# Patient Record
Sex: Male | Born: 1960
Health system: Southern US, Community
[De-identification: ages and names within clinical notes are randomized; demographics above are authoritative.]

## PROBLEM LIST (undated history)

## (undated) DIAGNOSIS — F419 Anxiety disorder, unspecified: Secondary | ICD-10-CM

## (undated) DIAGNOSIS — K219 Gastro-esophageal reflux disease without esophagitis: Secondary | ICD-10-CM

## (undated) DIAGNOSIS — I10 Essential (primary) hypertension: Secondary | ICD-10-CM

## (undated) DIAGNOSIS — N400 Enlarged prostate without lower urinary tract symptoms: Secondary | ICD-10-CM

## (undated) DIAGNOSIS — M51369 Other intervertebral disc degeneration, lumbar region without mention of lumbar back pain or lower extremity pain: Secondary | ICD-10-CM

## (undated) DIAGNOSIS — F319 Bipolar disorder, unspecified: Secondary | ICD-10-CM

## (undated) DIAGNOSIS — G9589 Other specified diseases of spinal cord: Secondary | ICD-10-CM

## (undated) DIAGNOSIS — G4730 Sleep apnea, unspecified: Secondary | ICD-10-CM

## (undated) DIAGNOSIS — M5136 Other intervertebral disc degeneration, lumbar region: Secondary | ICD-10-CM

## (undated) DIAGNOSIS — T7840XA Allergy, unspecified, initial encounter: Secondary | ICD-10-CM

## (undated) DIAGNOSIS — F4310 Post-traumatic stress disorder, unspecified: Secondary | ICD-10-CM

## (undated) DIAGNOSIS — R569 Unspecified convulsions: Secondary | ICD-10-CM

## (undated) DIAGNOSIS — R413 Other amnesia: Secondary | ICD-10-CM

## (undated) DIAGNOSIS — F209 Schizophrenia, unspecified: Secondary | ICD-10-CM

## (undated) HISTORY — DX: Gastro-esophageal reflux disease without esophagitis: K21.9

## (undated) HISTORY — DX: Post-traumatic stress disorder, unspecified: F43.10

## (undated) HISTORY — DX: Sleep apnea, unspecified: G47.30

## (undated) HISTORY — DX: Bipolar disorder, unspecified: F31.9

## (undated) HISTORY — DX: Other intervertebral disc degeneration, lumbar region: M51.36

## (undated) HISTORY — PX: COLONOSCOPY: SHX174

## (undated) HISTORY — PX: HERNIA REPAIR: SHX51

## (undated) HISTORY — DX: Other specified diseases of spinal cord: G95.89

## (undated) HISTORY — PX: CYSTECTOMY: SUR359

## (undated) HISTORY — DX: Other amnesia: R41.3

## (undated) HISTORY — DX: Schizophrenia, unspecified: F20.9

## (undated) HISTORY — PX: DENTAL SURGERY: SHX609

## (undated) HISTORY — DX: Other intervertebral disc degeneration, lumbar region without mention of lumbar back pain or lower extremity pain: M51.369

## (undated) HISTORY — DX: Allergy, unspecified, initial encounter: T78.40XA

## (undated) HISTORY — DX: Unspecified convulsions: R56.9

## (undated) HISTORY — DX: Anxiety disorder, unspecified: F41.9

---

## 2018-08-18 ENCOUNTER — Emergency Department (HOSPITAL_COMMUNITY)
Admission: EM | Admit: 2018-08-18 | Discharge: 2018-08-18 | Disposition: A | Discharge status: Home / Self Care | Payer: Medicaid Other | Attending: Emergency Medicine | Admitting: Emergency Medicine

## 2018-08-18 ENCOUNTER — Other Ambulatory Visit: Payer: Self-pay

## 2018-08-18 ENCOUNTER — Encounter (HOSPITAL_COMMUNITY): Payer: Self-pay

## 2018-08-18 DIAGNOSIS — I10 Essential (primary) hypertension: Secondary | ICD-10-CM

## 2018-08-18 DIAGNOSIS — R42 Dizziness and giddiness: Secondary | ICD-10-CM | POA: Insufficient documentation

## 2018-08-18 HISTORY — DX: Benign prostatic hyperplasia without lower urinary tract symptoms: N40.0

## 2018-08-18 HISTORY — DX: Essential (primary) hypertension: I10

## 2018-08-18 LAB — CBC WITH DIFFERENTIAL/PLATELET
Abs Immature Granulocytes: 0.01 10*3/uL (ref 0.00–0.07)
Basophils Absolute: 0 10*3/uL (ref 0.0–0.1)
Basophils Relative: 0 %
Eosinophils Absolute: 0.2 10*3/uL (ref 0.0–0.5)
Eosinophils Relative: 4 %
HCT: 43.8 % (ref 39.0–52.0)
Hemoglobin: 14.8 g/dL (ref 13.0–17.0)
Immature Granulocytes: 0 %
Lymphocytes Relative: 37 %
Lymphs Abs: 1.8 10*3/uL (ref 0.7–4.0)
MCH: 29.5 pg (ref 26.0–34.0)
MCHC: 33.8 g/dL (ref 30.0–36.0)
MCV: 87.4 fL (ref 80.0–100.0)
Monocytes Absolute: 0.5 10*3/uL (ref 0.1–1.0)
Monocytes Relative: 10 %
Neutro Abs: 2.3 10*3/uL (ref 1.7–7.7)
Neutrophils Relative %: 49 %
Platelets: 219 10*3/uL (ref 150–400)
RBC: 5.01 MIL/uL (ref 4.22–5.81)
RDW: 14.8 % (ref 11.5–15.5)
WBC: 4.8 10*3/uL (ref 4.0–10.5)
nRBC: 0 % (ref 0.0–0.2)

## 2018-08-18 LAB — COMPREHENSIVE METABOLIC PANEL
ALT: 21 U/L (ref 0–44)
AST: 26 U/L (ref 15–41)
Albumin: 3.8 g/dL (ref 3.5–5.0)
Alkaline Phosphatase: 79 U/L (ref 38–126)
Anion gap: 8 (ref 5–15)
BUN: 21 mg/dL — ABNORMAL HIGH (ref 6–20)
CO2: 23 mmol/L (ref 22–32)
Calcium: 8.9 mg/dL (ref 8.9–10.3)
Chloride: 107 mmol/L (ref 98–111)
Creatinine, Ser: 1.19 mg/dL (ref 0.61–1.24)
GFR calc Af Amer: >60 mL/min (ref >60)
GFR calc non Af Amer: >60 mL/min (ref >60)
Glucose, Bld: 76 mg/dL (ref 70–99)
Potassium: 3.8 mmol/L (ref 3.5–5.1)
Sodium: 138 mmol/L (ref 135–145)
Total Bilirubin: 0.4 mg/dL (ref 0.3–1.2)
Total Protein: 7 g/dL (ref 6.5–8.1)

## 2018-08-18 NOTE — ED Provider Notes (Signed)
[facility]-EMERGENCY DEPT Provider Note   CSN: [id number] Arrival date & time: 08/18/18  1258  History   Chief Complaint Chief Complaint  Patient presents with  . Hypertension    HPI Jeremiah Hunter is a 58 y.o. male with a PMH of HTN, enlarged prostate, depression, and anxiety presenting due to concerns about hypertension for 1 month. Patient reports he had his medications increased last month, but continues to have elevated blood pressures. Patient reports he had a brief episode of dizziness described as lightheadedness this morning while sitting down. Patient states it lasted a few seconds. Patient states nothing made symptoms better or worse. Patient states dizziness has completely resolved on its own. Patient denies fever, chills, nausea, vomiting, abdominal pain, congestion, headache, numbness, weakness, syncope, vision changes, chest pain, or shortness of breath.     HPI  Past Medical History:  Diagnosis Date  . Enlarged prostate   . Hypertension     There are no active problems to display for this patient.   Past Surgical History:  Procedure Laterality Date  . CYSTECTOMY    . HERNIA REPAIR          Home Medications    Prior to Admission medications   Not on File    Family History Family History  Family history unknown: Yes    Social History Social History   Tobacco Use  . Smoking status: Never Smoker  . Smokeless tobacco: Never Used  Substance Use Topics  . Alcohol use: Never    Frequency: Never  . Drug use: Never     Allergies   Patient has no known allergies.   Review of Systems Review of Systems  Constitutional: Negative for chills, diaphoresis and fever.  HENT: Negative for congestion and rhinorrhea.   Eyes: Negative for visual disturbance.  Respiratory: Negative for cough and shortness of breath.   Cardiovascular: Negative for chest pain, palpitations and leg swelling.  Gastrointestinal: Negative for abdominal  pain, nausea and vomiting.  Endocrine: Negative for cold intolerance and heat intolerance.  Genitourinary: Negative for difficulty urinating.  Musculoskeletal: Negative for back pain.  Skin: Negative for rash.  Allergic/Immunologic: Negative for immunocompromised state.  Neurological: Positive for dizziness and light-headedness. Negative for tremors, seizures, syncope, facial asymmetry, speech difficulty, weakness, numbness and headaches.  Hematological: Negative for adenopathy.     Physical Exam Updated Vital Signs BP (!) 130/96 (BP Location: Right Arm)   Pulse 100   Temp 100 F (37.8 C) (Oral)   Resp 20   Ht 5\' 11"  (1.803 m)   Wt 95.3 kg   SpO2 99%   BMI 29.29 kg/m   Physical Exam Vitals signs and nursing note reviewed.  Constitutional:      General: He is not in acute distress.    Appearance: He is well-developed. He is not diaphoretic.  HENT:     Head: Normocephalic and atraumatic.     Mouth/Throat:     Mouth: Mucous membranes are moist.     Pharynx: No oropharyngeal exudate or posterior oropharyngeal erythema.  Eyes:     Extraocular Movements: Extraocular movements intact.     Conjunctiva/sclera: Conjunctivae normal.     Pupils: Pupils are equal, round, and reactive to light.  Neck:     Musculoskeletal: Normal range of motion and neck supple.  Cardiovascular:     Rate and Rhythm: Normal rate and regular rhythm.     Heart sounds: Normal heart sounds. No murmur. No friction rub. No gallop.  Pulmonary:     Effort: Pulmonary effort is normal. No respiratory distress.     Breath sounds: Normal breath sounds. No wheezing or rales.  Abdominal:     Palpations: Abdomen is soft.     Tenderness: There is no abdominal tenderness.  Musculoskeletal: Normal range of motion.  Skin:    General: Skin is warm.     Findings: No erythema or rash.  Neurological:     Mental Status: He is alert.  Psychiatric:        Mood and Affect: Mood is anxious.    Mental Status:  Alert,  oriented, thought content appropriate, able to give a coherent history. Speech fluent without evidence of aphasia. Able to follow 2 step commands without difficulty.  Cranial Nerves:  II:  Peripheral visual fields grossly normal, pupils equal, round, reactive to light III,IV, VI: ptosis not present, extra-ocular motions intact bilaterally  V,VII: smile symmetric, facial light touch sensation equal VIII: hearing grossly normal to voice  IX,X: symmetric elevation of soft palate, uvula elevates symmetrically  XI: bilateral shoulder shrug symmetric and strong XII: midline tongue extension without fassiculations Motor:  Normal tone. 5/5 in upper and lower extremities bilaterally including strong and equal grip strength and dorsiflexion/plantar flexion Sensory: Pinprick and light touch normal in all extremities.  Deep Tendon Reflexes: 2+ and symmetric in the biceps and patella Cerebellar: normal finger-to-nose with bilateral upper extremities Gait: normal gait and balance.  Negative pronator drift. Negative Romberg sign. CV: distal pulses palpable throughout   ED Treatments / Results  Labs (all labs ordered are listed, but only abnormal results are displayed) Labs Reviewed  COMPREHENSIVE METABOLIC PANEL - Abnormal; Notable for the following components:      Result Value   BUN 21 (*)    All other components within normal limits  CBC WITH DIFFERENTIAL/PLATELET    EKG EKG Interpretation  Date/Time:  Monday August 18 2018 16:00:51 EDT Ventricular Rate:  72 PR Interval:    QRS Duration: 89 QT Interval:  370 QTC Calculation: 405 R Axis:   34 Text Interpretation:  Sinus rhythm Low voltage, extremity leads Confirmed by Davonna Belling [phone]) on 08/18/2018 5:09:15 PM   Radiology No results found.  Procedures Procedures (including critical care time)  Medications Ordered in ED Medications - No data to display   Initial Impression / Assessment and Plan / ED Course  I have reviewed  the triage vital signs and the nursing notes.  Pertinent labs & imaging results that were available during my care of the patient were reviewed by me and considered in my medical decision making (see chart for details).       Patient presents with concerns about hypertension and after an episode of lightheadedness. Vitals and labs reviewed. Blood pressure has been in the 130s/90s. Neurological exam is normal. Orthostatic vitals are normal. Patient has been asymptomatic while in the ER. Discussed BP during today's visit. Patient denies any symptoms of hypertensive crisis. Patient is stable in no acute distress. Advised patient to follow up with PCP. Discussed return precautions. Patient will be discharged home.   Final Clinical Impressions(s) / ED Diagnoses   Final diagnoses:  Essential hypertension  Dizziness    ED Discharge Orders    None       Julienne Kass 08/18/18 1709    Davonna Belling, MD 08/18/18 2151

## 2018-08-18 NOTE — Discharge Instructions (Addendum)
You have been seen today for hypertension and dizziness. Please read and follow all provided instructions.   1. Medications: usual home medications 2. Treatment: rest, drink plenty of fluids 3. Follow Up: Please follow up with your primary doctor in 2-5 days for discussion of your diagnoses and further evaluation after today's visit; if you do not have a primary care doctor use the resource guide provided to find one; Please return to the ER for any new or worsening symptoms. Please obtain all of your results from medical records or have your doctors office obtain the results - share them with your doctor - you should be seen at your doctors office. Call today to arrange your follow up.   Take medications as prescribed. Please review all of the medicines and only take them if you do not have an allergy to them. Return to the emergency room for worsening condition or new concerning symptoms. Follow up with your regular doctor. If you don't have a regular doctor use one of the numbers below to establish a primary care doctor. ? ?  You should return to the ER if you develop severe or worsening symptoms.   Emergency Department Resource Guide 1) Find a Doctor and Pay Out of Pocket Although you won't have to find out who is covered by your insurance plan, it is a good idea to ask around and get recommendations. You will then need to call the office and see if the doctor you have chosen will accept you as a new patient and what types of options they offer for patients who are self-pay. Some doctors offer discounts or will set up payment plans for their patients who do not have insurance, but you will need to ask so you aren't surprised when you get to your appointment.  2) Contact Your Local Health Department Not all health departments have doctors that can see patients for sick visits, but many do, so it is worth a call to see if yours does. If you don't know where your local health department is, you can  check in your phone book. The CDC also has a tool to help you locate your state's health department, and many state websites also have listings of all of their local health departments.  3) Find a [facility] If your illness is not likely to be very severe or complicated, you may want to try a walk in clinic. These are popping up all over the country in pharmacies, drugstores, and shopping centers. They're usually staffed by nurse practitioners or physician assistants that have been trained to treat common illnesses and complaints. They're usually fairly quick and inexpensive. However, if you have serious medical issues or chronic medical problems, these are probably not your best option.  No Primary Care Doctor: Call Health Connect at  [phone] - they can help you locate a primary care doctor that  accepts your insurance, provides certain services, etc. Physician Referral Service- [phone]  Chronic Pain Problems: Organization         Address  Phone   Notes  [facility]  [phone] Patients need to be referred by their primary care doctor.   Medication Assistance: Organization         Address  Phone   Notes  [facility] Medication [facility] [address] [place], [place] [address] [phone] --Must be a resident of [facility] -- Must have NO insurance coverage whatsoever (no Medicaid/ Medicare, etc.) -- The  pt. MUST have a primary care doctor that directs their care regularly and follows them in the community   [facility]ist  [phone]   [employer]  [phone]    Agencies that provide inexpensive medical care: Organization         Address  Phone   Notes  [facility]  [phone]   [facility]    [phone]   [facility] [address] [place], [place] [id number] [phone]   [facility] [address], [place] [phone]    [facility]    [phone]   [facility]    [phone]   [facility]  [address], [place] Phone:  [phone], Fax:  [phone] Hours of Operation:  9 am - 6 pm, M-F.  Also accepts Medicaid/Medicare and self-pay.  [facility]  [address], [place] Phone: [phone], Fax: [phone]. Hours of Operation:  8:30 am - 5:30 pm, M-F.  Also accepts Medicaid and self-pay.  [facility] [place] [address], [place] Phone: [phone]) [phone]   [facility] [address], [place], [place] [phone], Ext. [phone] Mondays & Thursdays: 7-9 AM.  First 15 patients are seen on a first come, first serve basis.    Medicaid-accepting [facility] Providers:  Organization         Address  Phone   Notes  [facility] [address], [place] [phone] Also accepts self-pay patients.  [facility] [address] [place], [place]  [phone]   [facility] [address], [place] [phone]   [facility] [address] [place] [address], [place] [phone]   Renaye RakersVeita Bland [address], [place]   [phone] Only accepts [place] patients after they have their name applied to their card.   Self-Pay (no insurance) in [facility]:  Organization         Address  Phone   Notes  Sickle Cell Patients, [facility] [address], [place] [phone]   [facility] [address], [place] [phone]   [facility] [place]  [address], [place] [phone]   [facility]/Dr. Osei-Bonsu  [address] [place] [address], [place] or [phone], [place] [phone] Phone number for both [place] and [place] locations is the  same.  [facility] [address], [place] [phone]   [facility]  [address] [place] [address], [place] or [address] [phone]   [facility] [address], [place] [phone], phone; [phone]) [phone], fax Sees patients 1st and 3rd Saturday of every month.  Must not qualify for public or private insurance (i.e. Medicaid, Medicare, [place] Health Choice, Veterans' Benefits)  Household income should be no more than 200% of the poverty level The clinic cannot treat you if you are pregnant or think you are pregnant  Sexually transmitted diseases are not treated at the clinic.

## 2018-08-18 NOTE — ED Triage Notes (Signed)
Patient states he has been having increased BP x 1 month. Patient states his medications were changed 1 month ago and is still having hypertension. BP in triage 133/96 HR 114

## 2018-10-07 ENCOUNTER — Telehealth: Payer: Self-pay | Admitting: Nurse Practitioner

## 2018-10-08 ENCOUNTER — Encounter: Payer: Self-pay | Admitting: Family Medicine

## 2018-10-08 ENCOUNTER — Ambulatory Visit: Payer: Medicaid Other | Attending: Nurse Practitioner | Admitting: Family Medicine

## 2018-10-08 ENCOUNTER — Ambulatory Visit (HOSPITAL_BASED_OUTPATIENT_CLINIC_OR_DEPARTMENT_OTHER): Payer: Medicaid Other | Admitting: Licensed Clinical Social Worker

## 2018-10-08 ENCOUNTER — Other Ambulatory Visit: Payer: Self-pay

## 2018-10-08 DIAGNOSIS — R350 Frequency of micturition: Secondary | ICD-10-CM

## 2018-10-08 DIAGNOSIS — F419 Anxiety disorder, unspecified: Secondary | ICD-10-CM

## 2018-10-08 DIAGNOSIS — I10 Essential (primary) hypertension: Secondary | ICD-10-CM

## 2018-10-08 DIAGNOSIS — F329 Major depressive disorder, single episode, unspecified: Secondary | ICD-10-CM | POA: Diagnosis not present

## 2018-10-08 DIAGNOSIS — F32A Depression, unspecified: Secondary | ICD-10-CM

## 2018-10-08 DIAGNOSIS — F333 Major depressive disorder, recurrent, severe with psychotic symptoms: Secondary | ICD-10-CM

## 2018-10-08 MED ORDER — LOSARTAN POTASSIUM 25 MG PO TABS: 25.0000 mg | ORAL_TABLET | Freq: Every day | ORAL | 5 refills | Status: DC

## 2018-10-08 MED ORDER — AMLODIPINE BESYLATE 10 MG PO TABS: 10.0000 mg | ORAL_TABLET | Freq: Every day | ORAL | 5 refills | Status: DC

## 2018-10-08 NOTE — Progress Notes (Signed)
Pt states he has physical pain   Pt states when he goes out in public he feels like someone is out to kill him   Pt states he wakes his sister up at time
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
## 2018-10-09 MED FILL — LOSARTAN POTASSIUM 25 MG TA: 25 | 30 days supply | Qty: 30 | Fill #0

## 2018-10-09 MED FILL — AMLODIPINE BESYLATE 10 MG T: 10 | 30 days supply | Qty: 30 | Fill #0

## 2018-10-10 ENCOUNTER — Other Ambulatory Visit: Payer: Self-pay

## 2018-10-13 ENCOUNTER — Ambulatory Visit: Payer: Self-pay | Attending: Family Medicine

## 2018-10-13 DIAGNOSIS — R350 Frequency of micturition: Secondary | ICD-10-CM

## 2018-10-13 NOTE — Addendum Note (Signed)
Addended by: Emilio Aspen A on: 10/13/2018 08:59 AM   Modules accepted: Orders

## 2018-10-14 ENCOUNTER — Other Ambulatory Visit: Payer: Self-pay | Admitting: Family Medicine

## 2018-10-14 DIAGNOSIS — R319 Hematuria, unspecified: Secondary | ICD-10-CM

## 2018-10-14 LAB — URINALYSIS
Bilirubin, UA: NEGATIVE
Glucose, UA: NEGATIVE
Ketones, UA: NEGATIVE
Leukocytes,UA: NEGATIVE
Nitrite, UA: NEGATIVE
Protein,UA: NEGATIVE
Specific Gravity, UA: 1.013 (ref 1.005–1.030)
Urobilinogen, Ur: 0.2 mg/dL (ref 0.2–1.0)
pH, UA: 5.5 (ref 5.0–7.5)

## 2018-10-14 LAB — BASIC METABOLIC PANEL WITH GFR
BUN/Creatinine Ratio: 22 — ABNORMAL HIGH (ref 9–20)
BUN: 25 mg/dL — ABNORMAL HIGH (ref 6–24)
CO2: 22 mmol/L (ref 20–29)
Calcium: 9.2 mg/dL (ref 8.7–10.2)
Chloride: 105 mmol/L (ref 96–106)
Creatinine, Ser: 1.16 mg/dL (ref 0.76–1.27)
GFR calc Af Amer: 81 mL/min/1.73
GFR calc non Af Amer: 70 mL/min/1.73
Glucose: 108 mg/dL — ABNORMAL HIGH (ref 65–99)
Potassium: 4 mmol/L (ref 3.5–5.2)
Sodium: 141 mmol/L (ref 134–144)

## 2018-10-14 NOTE — BH Specialist Note (Signed)
Integrated Behavioral Health Visit via Telemedicine (Telephone)  10/08/2018 [name] [id number]   Session Start time: 3:00 PM  Session End time: 3:20 PM Total time: 20 minutes  Referring Provider: Dr. Chapman Fitch Type of Visit: Telephonic Patient location: Home [facility] Provider location: Office All persons participating in visit: Pt and LCSW  Confirmed patient's address: Yes  Confirmed patient's phone number: Yes  Any changes to demographics: No   Confirmed patient's insurance: Yes  Any changes to patient's insurance: No   Discussed confidentiality: Yes    The following statements were read to the patient and/or legal guardian that are established with the [facility] Provider.  "The purpose of this phone visit is to provide behavioral health care while limiting exposure to the coronavirus (COVID19).  There is a possibility of technology failure and discussed alternative modes of communication if that failure occurs."  "By engaging in this telephone visit, you consent to the provision of healthcare.  Additionally, you authorize for your insurance to be billed for the services provided during this telephone visit."   Patient and/or legal guardian consented to telephone visit: Yes   PRESENTING CONCERNS: Patient and/or family reports the following symptoms/concerns: Pt reports difficulty managing mental health conditions. Reports audio/visual non-command hallucinations that scare him occasionally "I don't feel safe nowhere"  Pt resides with sister and enjoys spending time with nieces and nephews. He participates in counseling through [facility], night class on Mondays and Tuesdays, and Peer Support with Mr. Liliane Channel  Pt reports that he is currently on probation. He does not have any income and/or insurance  Duration of problem: "years"; Severity of problem: severe  STRENGTHS (Protective Factors/Coping Skills): Pt receives strong family support Pt is open to services  (psychotherapy/psychiatry)  GOALS ADDRESSED: Patient will: 1.  Reduce symptoms of: anxiety, depression and stress  2.  Increase knowledge and/or ability of: coping skills and healthy habits  3.  Demonstrate ability to: Increase healthy adjustment to current life circumstances and Increase adequate support systems for patient/family  INTERVENTIONS: Interventions utilized:  Mindfulness or Psychologist, educational, Supportive Counseling, Psychoeducation and/or Health Education and Link to [employer] Standardized Assessments completed: GAD-7 and PHQ 2&9  ASSESSMENT: Patient currently experiencing depression and anxiety triggered by psychosocial stressors. He reports audio/visual non-command hallucinations that scare him occasionally "I don't feel safe nowhere" Pt is receiving services in the community. Denies SI/HI.  Patient may benefit from medication management. He states that service providers were not person-centered in their treatment planning. LCSW provided validation and encouragement. Therapeutic interventions were discussed to assist in decrease and/or management of symptoms.   PLAN: 1. Follow up with behavioral health clinician on : Schedule follow up appointment  2. Behavioral recommendations: LCSW recommends pt utilize strategies discussed in session and comply with recommendations of PCP 3. Referral(s): [place] (In Clinic)  Rebekah Chesterfield, [place] 10/14/2018 4:41 PM

## 2018-10-14 NOTE — Progress Notes (Signed)
Patient ID: Jeremiah Hunter, male   DOB: 06-Jan-1961, 58 y.o.   MRN: [id number]   Patient with rbc's on recent UA and referral to Urology will be placed

## 2018-10-20 ENCOUNTER — Ambulatory Visit: Payer: Self-pay

## 2018-10-23 ENCOUNTER — Telehealth: Payer: Self-pay | Admitting: Family Medicine

## 2018-10-23 NOTE — Telephone Encounter (Signed)
Called patient to schedule his f/u appointment. Patient stated he has found a new PCP closer to his home.

## 2018-10-23 NOTE — Telephone Encounter (Signed)
-----   Message from Jackelyn Knife, [place] sent at 10/08/2018  5:06 PM EDT -----  lab visit in the next 1-2 weeks and he will need office visit in 4-5 weeks

## 2018-10-24 ENCOUNTER — Ambulatory Visit: Payer: Self-pay

## 2018-11-06 ENCOUNTER — Emergency Department (HOSPITAL_COMMUNITY)
Admission: EM | Admit: 2018-11-06 | Discharge: 2018-11-09 | Disposition: A | Discharge status: Home / Self Care | Payer: Medicaid Other | Attending: Emergency Medicine | Admitting: Emergency Medicine

## 2018-11-06 ENCOUNTER — Encounter (HOSPITAL_COMMUNITY): Payer: Self-pay | Admitting: Emergency Medicine

## 2018-11-06 DIAGNOSIS — Z20828 Contact with and (suspected) exposure to other viral communicable diseases: Secondary | ICD-10-CM | POA: Diagnosis not present

## 2018-11-06 DIAGNOSIS — F333 Major depressive disorder, recurrent, severe with psychotic symptoms: Secondary | ICD-10-CM | POA: Insufficient documentation

## 2018-11-06 DIAGNOSIS — F329 Major depressive disorder, single episode, unspecified: Secondary | ICD-10-CM | POA: Diagnosis present

## 2018-11-06 DIAGNOSIS — F22 Delusional disorders: Secondary | ICD-10-CM

## 2018-11-06 DIAGNOSIS — R45851 Suicidal ideations: Secondary | ICD-10-CM

## 2018-11-06 DIAGNOSIS — I10 Essential (primary) hypertension: Secondary | ICD-10-CM | POA: Insufficient documentation

## 2018-11-06 LAB — COMPREHENSIVE METABOLIC PANEL
ALT: 22 U/L (ref 0–44)
AST: 24 U/L (ref 15–41)
Albumin: 3.7 g/dL (ref 3.5–5.0)
Alkaline Phosphatase: 95 U/L (ref 38–126)
Anion gap: 9 (ref 5–15)
BUN: 25 mg/dL — ABNORMAL HIGH (ref 6–20)
CO2: 23 mmol/L (ref 22–32)
Calcium: 9.1 mg/dL (ref 8.9–10.3)
Chloride: 109 mmol/L (ref 98–111)
Creatinine, Ser: 1.19 mg/dL (ref 0.61–1.24)
GFR calc Af Amer: >60 mL/min (ref >60)
GFR calc non Af Amer: >60 mL/min (ref >60)
Glucose, Bld: 109 mg/dL — ABNORMAL HIGH (ref 70–99)
Potassium: 3.6 mmol/L (ref 3.5–5.1)
Sodium: 141 mmol/L (ref 135–145)
Total Bilirubin: 0.7 mg/dL (ref 0.3–1.2)
Total Protein: 6.7 g/dL (ref 6.5–8.1)

## 2018-11-06 LAB — RAPID URINE DRUG SCREEN, HOSP PERFORMED
Amphetamines: NOT DETECTED
Barbiturates: NOT DETECTED
Benzodiazepines: NOT DETECTED
Cocaine: NOT DETECTED
Opiates: NOT DETECTED
Tetrahydrocannabinol: NOT DETECTED

## 2018-11-06 LAB — CBC
HCT: 41.2 % (ref 39.0–52.0)
Hemoglobin: 14.2 g/dL (ref 13.0–17.0)
MCH: 30 pg (ref 26.0–34.0)
MCHC: 34.5 g/dL (ref 30.0–36.0)
MCV: 87.1 fL (ref 80.0–100.0)
Platelets: 210 10*3/uL (ref 150–400)
RBC: 4.73 MIL/uL (ref 4.22–5.81)
RDW: 14.4 % (ref 11.5–15.5)
WBC: 4.5 10*3/uL (ref 4.0–10.5)
nRBC: 0 % (ref 0.0–0.2)

## 2018-11-06 LAB — ETHANOL: Alcohol, Ethyl (B): <10 mg/dL (ref <10)

## 2018-11-06 LAB — ACETAMINOPHEN LEVEL: Acetaminophen (Tylenol), Serum: <10 ug/mL — ABNORMAL LOW (ref 10–30)

## 2018-11-06 LAB — SALICYLATE LEVEL: Salicylate Lvl: <7 mg/dL (ref 2.8–30.0)

## 2018-11-06 MED ORDER — HALOPERIDOL LACTATE 5 MG/ML IJ SOLN: 5.0000 mg | Freq: Four times a day (QID) | INTRAMUSCULAR | Status: DC | PRN

## 2018-11-06 MED ORDER — ONDANSETRON HCL 4 MG PO TABS: 4.0000 mg | ORAL_TABLET | Freq: Three times a day (TID) | ORAL | Status: DC | PRN

## 2018-11-06 MED ORDER — ACETAMINOPHEN 325 MG PO TABS: 650.0000 mg | ORAL_TABLET | ORAL | Status: DC | PRN

## 2018-11-06 MED ORDER — ALUM & MAG HYDROXIDE-SIMETH 200-200-20 MG/5ML PO SUSP: 30.0000 mL | Freq: Four times a day (QID) | ORAL | Status: DC | PRN

## 2018-11-06 NOTE — ED Provider Notes (Signed)
[facility] EMERGENCY DEPARTMENT Provider Note   CSN: [id number] Arrival date & time: 11/06/18  1737     History   Chief Complaint Chief Complaint  Patient presents with  . Suicidal    HPI Jeremiah Hunter is a 58 y.o. male.     HPI  The patient is a 58 year old male who presented to the ED with concern for suicidal ideation and psychosis. The patient made a statement to triage nursing regarding a desire to "not be here anymore" and stated that he thought nightly about ingesting a bottle of pills. On my assessment, the patient was paranoid and would not answer questions.   Past Medical History:  Diagnosis Date  . Enlarged prostate   . Hypertension     There are no active problems to display for this patient.   Past Surgical History:  Procedure Laterality Date  . CYSTECTOMY    . HERNIA REPAIR          Home Medications    Prior to Admission medications   Medication Sig Start Date End Date Taking? Authorizing Provider  amLODipine (NORVASC) 10 MG tablet Take 1 tablet (10 mg total) by mouth daily. To lower blood pressure 10/08/18   Fulp, Cammie, MD  losartan (COZAAR) 25 MG tablet Take 1 tablet (25 mg total) by mouth daily. To lower blood pressure 10/08/18   Fulp, Hewitt Shorts, MD    Family History Family History  Family history unknown: Yes    Social History Social History   Tobacco Use  . Smoking status: Never Smoker  . Smokeless tobacco: Never Used  Substance Use Topics  . Alcohol use: Never    Frequency: Never  . Drug use: Never     Allergies   Patient has no known allergies.   Review of Systems Review of Systems  Unable to perform ROS: Other     Physical Exam Updated Vital Signs BP (!) 106/94 (BP Location: Left Arm)   Pulse 94   Temp 98.2 F (36.8 C) (Oral)   Resp 16   SpO2 97%   Physical Exam Vitals signs and nursing note reviewed.  Constitutional:      Appearance: He is well-developed.  HENT:     Head: Normocephalic  and atraumatic.  Eyes:     Conjunctiva/sclera: Conjunctivae normal.  Neck:     Musculoskeletal: Neck supple.  Cardiovascular:     Rate and Rhythm: Normal rate and regular rhythm.  Pulmonary:     Effort: Pulmonary effort is normal. No respiratory distress.     Breath sounds: Normal breath sounds.  Abdominal:     Palpations: Abdomen is soft.     Tenderness: There is no abdominal tenderness.  Skin:    General: Skin is warm and dry.  Neurological:     Mental Status: He is alert.  Psychiatric:        Mood and Affect: Mood is anxious.        Behavior: Behavior is uncooperative.        Thought Content: Thought content is paranoid and delusional.     Comments: The patient endorsed delusions of hospital staff attempting to harm him. He refused to answer questions regarding thoughts of suicide or hallucinations but had previously admitted to [facility] with a plan with triage nursing      ED Treatments / Results  Labs (all labs ordered are listed, but only abnormal results are displayed) Labs Reviewed  COMPREHENSIVE METABOLIC PANEL - Abnormal; Notable for the following  components:      Result Value   Glucose, Bld 109 (*)    BUN 25 (*)    All other components within normal limits  ACETAMINOPHEN LEVEL - Abnormal; Notable for the following components:   Acetaminophen (Tylenol), Serum <10 (*)    All other components within normal limits  SARS CORONAVIRUS 2 BY RT PCR (HOSPITAL ORDER, [place] [facility])  ETHANOL  SALICYLATE LEVEL  CBC  RAPID URINE DRUG SCREEN, HOSP PERFORMED    EKG None  Radiology No results found.  Procedures Procedures (including critical care time)  Medications Ordered in ED Medications  acetaminophen (TYLENOL) tablet 650 mg (has no administration in time range)  ondansetron (ZOFRAN) tablet 4 mg (has no administration in time range)  alum & mag hydroxide-simeth (MAALOX/MYLANTA) 200-200-20 MG/5ML suspension 30 mL (has no administration in time  range)  haloperidol lactate (HALDOL) injection 5 mg (has no administration in time range)     Initial Impression / Assessment and Plan / ED Course  I have reviewed the triage vital signs and the nursing notes.  Pertinent labs & imaging results that were available during my care of the patient were reviewed by me and considered in my medical decision making (see chart for details).        The patient presents to the ED with SI and a plan with additional paranoia and delusions of hospital staff attempting to harm him. He would not provided further information on initial assessment and was uncooperative. He was placed in an IVC status by Dr. Dayna Barker. Plan will be for a psychiatric hold and consultation with psychiatry. Family was not able to be contacted on initial assessment.   Final Clinical Impressions(s) / ED Diagnoses   Final diagnoses:  Suicidal thoughts  Paranoia [facility])    ED Discharge Orders    None       Regan Lemming, MD 11/07/18 0867    Merrily Pew, MD 11/08/18 [phone]

## 2018-11-06 NOTE — BHH Counselor (Addendum)
Clinician attempted to engage the pt in TTS assessment however he was adamant not to engage because Amy (clinician at "[place]") is unable to engage because she's not at work. Pt reported, he went to "[place]" in [facility] today, he initially thought Amy was a doctor but learned she was a Social worker. Pt reported, he spoke to Amy via tele-assessment and recommend he come to hospital. Pt reported, he was brought to [facility] by his family. Pt reported, she was supposed to come to the hospital on Monday. Clinician asked the pt if he consents for her to contact Amy and provide her with her contact information; she could gather collateral information. Pt declined, reported, he wants to complete the assessment with Amy on three-way. Clinician attempted to discussed the process however the pt interrupted and said he was not talking and wanted to go home.   *Pt declined for clinician to contact anyone, to gather collateral information.*   Spoke to Dr. Dayna Barker he IVC'd the pt and is fine with the pt having his TTS assessment completed during day-shift, as he would not engage.   Vertell Novak, [place], [facility], [facility] Triage Specialist [phone]

## 2018-11-06 NOTE — ED Notes (Signed)
Pts belonging were taken and placed in patient bags and at triage desk. There were no purple pants for pt so he is in wine colored shirt with his own jeans. Pt has removed everything from pockets and his belt. Security has been called for wanding.

## 2018-11-06 NOTE — ED Triage Notes (Signed)
pt arrives ambulatory and voluntary to ED with c/o of feeling like he wants to "not be here anymore". Pt states every night he looks at his pill bottles and thinks about taking them all.

## 2018-11-06 NOTE — BHH Counselor (Signed)
Clinician was transferred to the wrong nurse, clinician called again and noted a nurse was not assigned to the pt and was transferred to [facility], [place].   Clinician spoke to [facility], [place] to ask if she was the nurse working the pt. Monica reported, she was not but she will inform the charge nurse to place the pt in a private to complete his TTS assessment. Clinician expressed she will call the cart in 10 minutes.    Vertell Novak, [place], [facility], [facility] Triage Specialist [phone]

## 2018-11-06 NOTE — ED Notes (Signed)
Pt refusing to talk to this RN. Just keeps saying "youre going to keep me and kill me. That's what you guys do."

## 2018-11-07 ENCOUNTER — Other Ambulatory Visit: Payer: Self-pay

## 2018-11-07 LAB — SARS CORONAVIRUS 2 BY RT PCR (HOSPITAL ORDER, PERFORMED IN ~~LOC~~ HOSPITAL LAB): SARS Coronavirus 2: NEGATIVE
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
## 2018-11-07 NOTE — ED Notes (Signed)
Patient is resting comfortably. 

## 2018-11-07 NOTE — ED Notes (Signed)
Breakfast tray ordered 

## 2018-11-07 NOTE — BH Assessment (Signed)
  Under review [facility], [facility], [place], [place] [place], [place], [facility], [facility], [place], [facility], [facility], [place], [place], [place], [place], [place], [place], [place], [facility], [facility], [facility], [facility], [facility], [facility], [facility], [facility], [facility], [facility], [place], [place], [place], [place], [place], [place], [facility], [place]  

## 2018-11-08 MED ORDER — LORATADINE 10 MG PO TABS
10.0000 mg | ORAL_TABLET | Freq: Every day | ORAL | Status: DC
  Filled 2018-11-08: qty 1

## 2018-11-08 MED ORDER — OLANZAPINE 5 MG PO TABS
5.0000 mg | ORAL_TABLET | Freq: Every day | ORAL | Status: DC
  Administered 2018-11-08: 5 mg via ORAL
  Filled 2018-11-08: qty 1

## 2018-11-08 MED ORDER — LOSARTAN POTASSIUM 50 MG PO TABS
25.0000 mg | ORAL_TABLET | Freq: Every day | ORAL | Status: DC
  Administered 2018-11-08: 25 mg via ORAL
  Filled 2018-11-08: qty 1

## 2018-11-08 MED ORDER — TAMSULOSIN HCL 0.4 MG PO CAPS
0.4000 mg | ORAL_CAPSULE | Freq: Every day | ORAL | Status: DC
  Administered 2018-11-08 – 2018-11-09 (×2): 0.4 mg via ORAL
  Filled 2018-11-08 (×2): qty 1

## 2018-11-08 MED ORDER — AMLODIPINE BESYLATE 5 MG PO TABS: 10.0000 mg | ORAL_TABLET | Freq: Every day | ORAL | Status: DC

## 2018-11-08 MED ORDER — DIVALPROEX SODIUM 250 MG PO DR TAB
250.0000 mg | DELAYED_RELEASE_TABLET | Freq: Two times a day (BID) | ORAL | Status: DC
  Administered 2018-11-08 (×2): 250 mg via ORAL
  Filled 2018-11-08 (×2): qty 1

## 2018-11-08 NOTE — ED Notes (Signed)
Pt's belongings inventoried - Belongings were located in [place] #3. 2 Labeled Belongings Bags - clothing, belt, key, and flashlight, along w/shoes. Pt's cell phone and wallet placed in [place].

## 2018-11-08 NOTE — ED Notes (Signed)
Pt took meds w/o difficulty. Pt refused Claritin - states he no longer needs it. Pt aware his sister, Joycelyn Schmid, called.

## 2018-11-08 NOTE — ED Notes (Signed)
Pt made phone call from nurses' desk talking w/"Maxine".

## 2018-11-08 NOTE — ED Notes (Signed)
Pt ambulated to bathroom then back to room w/o difficulty. Pt sitting on side of bed eating breakfast.

## 2018-11-08 NOTE — ED Notes (Signed)
Pt showered - new scrubs given. Pt placed jeans in labeled belongings bag after removing note from pocket. RN placed bag in [place] # 3 - w/pt's other belongings bag.

## 2018-11-08 NOTE — BH Assessment (Addendum)
11/08/2018:  Reassessment The Counselor attempted to reassess the Patient.  The Patient would not verbally answer any questions.  He did write out a note that indicated that he "feared" I/Counselor would not help him.  Patient declined to write out answers to individual questions.    The Patient's Nurse reports Patient was non-verbal with her, however did talk some with his Sitter.    Per Priscille Loveless, NP;  Patient continues to meet inpatient criteria and placement will be sought.

## 2018-11-08 NOTE — ED Notes (Signed)
Pt's sister, Joycelyn Schmid, called inquiring about pt.

## 2018-11-08 NOTE — ED Notes (Signed)
Pt lying on bed watching tv. Pt noted w/min eye contact. Pt will nod head yes or no - no verbal communication noted. Asked pt if wants lights dimmed to room, pt shook head no.

## 2018-11-09 MED ORDER — DIVALPROEX SODIUM 250 MG PO DR TAB
500.0000 mg | DELAYED_RELEASE_TABLET | Freq: Two times a day (BID) | ORAL | Status: DC
  Administered 2018-11-09: 09:00:00 500 mg via ORAL
  Filled 2018-11-09: qty 2

## 2018-11-09 MED ORDER — OLANZAPINE 5 MG PO TABS
10.0000 mg | ORAL_TABLET | Freq: Two times a day (BID) | ORAL | Status: DC
  Administered 2018-11-09: 10 mg via ORAL
  Filled 2018-11-09: qty 2

## 2018-11-09 MED ORDER — DIVALPROEX SODIUM 500 MG PO DR TAB: 500.0000 mg | DELAYED_RELEASE_TABLET | Freq: Two times a day (BID) | ORAL | 0 refills | Status: DC

## 2018-11-09 MED ORDER — OLANZAPINE 10 MG PO TABS: 10.0000 mg | ORAL_TABLET | Freq: Every day | ORAL | 0 refills | Status: DC

## 2018-11-09 NOTE — ED Notes (Signed)
Pt aware of tx plan, per Farris Has, [facility] NP, - d/c to home. Pt on phone w/sister, Joycelyn Schmid, advising - per pt, she is going to pick him up at 1145.

## 2018-11-09 NOTE — ED Notes (Signed)
Pt's breakfast arrived 

## 2018-11-09 NOTE — ED Notes (Signed)
ALL belongings - 2 labeled belongings bags and 1 valuables envelope - returned to pt - Pt signed verifying all items present. D/C paperwork discussed and given - Pt voiced understanding.

## 2018-11-09 NOTE — ED Notes (Signed)
Telepsych being performed. 

## 2018-11-09 NOTE — ED Notes (Signed)
Breakfast tray ordered 

## 2018-11-09 NOTE — ED Notes (Addendum)
Per Farris Has, [facility] NP, pt is recommended for D/C. Dr Gilford Raid aware. IVC papers rescinded by Dr Gilford Raid - Copy faxed to [facility], Copy sent to Medical Records - Original placed in folder for Magistrate.
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
## 2018-11-09 NOTE — ED Provider Notes (Signed)
Pt has been seen and evaluated by psych.  Pt is no longer feeling suicidal and is ready to go home.  Pt's sister will come get him.     Isla Pence, MD 11/09/18 1131

## 2018-11-13 ENCOUNTER — Encounter (HOSPITAL_COMMUNITY): Payer: Self-pay | Admitting: *Deleted

## 2018-11-13 ENCOUNTER — Other Ambulatory Visit: Payer: Self-pay

## 2018-11-13 ENCOUNTER — Emergency Department (HOSPITAL_COMMUNITY): Payer: Medicaid Other

## 2018-11-13 ENCOUNTER — Emergency Department (HOSPITAL_COMMUNITY)
Admission: EM | Admit: 2018-11-13 | Discharge: 2018-11-13 | Disposition: A | Discharge status: Home / Self Care | Payer: Medicaid Other | Attending: Emergency Medicine | Admitting: Emergency Medicine

## 2018-11-13 DIAGNOSIS — Z5321 Procedure and treatment not carried out due to patient leaving prior to being seen by health care provider: Secondary | ICD-10-CM | POA: Diagnosis not present

## 2018-11-13 DIAGNOSIS — M542 Cervicalgia: Secondary | ICD-10-CM | POA: Insufficient documentation

## 2018-11-13 MED ORDER — SODIUM CHLORIDE 0.9% FLUSH: 3.0000 mL | Freq: Once | INTRAVENOUS | Status: DC
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
## 2018-11-13 NOTE — ED Notes (Signed)
Patient stated that he had to leave cause of a family emergency he stated that he will return tomorrow

## 2018-11-19 ENCOUNTER — Inpatient Hospital Stay: Payer: Medicaid Other

## 2018-12-06 ENCOUNTER — Emergency Department (HOSPITAL_COMMUNITY)
Admission: EM | Admit: 2018-12-06 | Discharge: 2018-12-09 | Disposition: A | Discharge status: Other Acute Inpatient Hospital | Payer: Medicaid Other | Attending: Emergency Medicine | Admitting: Emergency Medicine

## 2018-12-06 ENCOUNTER — Other Ambulatory Visit: Payer: Self-pay

## 2018-12-06 ENCOUNTER — Encounter (HOSPITAL_COMMUNITY): Payer: Self-pay | Admitting: Emergency Medicine

## 2018-12-06 DIAGNOSIS — R443 Hallucinations, unspecified: Secondary | ICD-10-CM

## 2018-12-06 DIAGNOSIS — R45851 Suicidal ideations: Secondary | ICD-10-CM | POA: Diagnosis not present

## 2018-12-06 DIAGNOSIS — I10 Essential (primary) hypertension: Secondary | ICD-10-CM | POA: Insufficient documentation

## 2018-12-06 DIAGNOSIS — F333 Major depressive disorder, recurrent, severe with psychotic symptoms: Secondary | ICD-10-CM | POA: Diagnosis not present

## 2018-12-06 DIAGNOSIS — Z79899 Other long term (current) drug therapy: Secondary | ICD-10-CM | POA: Insufficient documentation

## 2018-12-06 DIAGNOSIS — Z20828 Contact with and (suspected) exposure to other viral communicable diseases: Secondary | ICD-10-CM | POA: Diagnosis not present

## 2018-12-06 DIAGNOSIS — R440 Auditory hallucinations: Secondary | ICD-10-CM | POA: Diagnosis present

## 2018-12-06 LAB — COMPREHENSIVE METABOLIC PANEL
ALT: 22 U/L (ref 0–44)
AST: 27 U/L (ref 15–41)
Albumin: 3.8 g/dL (ref 3.5–5.0)
Alkaline Phosphatase: 118 U/L (ref 38–126)
Anion gap: 8 (ref 5–15)
BUN: 21 mg/dL — ABNORMAL HIGH (ref 6–20)
CO2: 25 mmol/L (ref 22–32)
Calcium: 9.3 mg/dL (ref 8.9–10.3)
Chloride: 106 mmol/L (ref 98–111)
Creatinine, Ser: 1.05 mg/dL (ref 0.61–1.24)
GFR calc Af Amer: >60 mL/min (ref >60)
GFR calc non Af Amer: >60 mL/min (ref >60)
Glucose, Bld: 98 mg/dL (ref 70–99)
Potassium: 3.9 mmol/L (ref 3.5–5.1)
Sodium: 139 mmol/L (ref 135–145)
Total Bilirubin: 0.5 mg/dL (ref 0.3–1.2)
Total Protein: 6.9 g/dL (ref 6.5–8.1)

## 2018-12-06 LAB — CBC
HCT: 41.7 % (ref 39.0–52.0)
Hemoglobin: 13.8 g/dL (ref 13.0–17.0)
MCH: 29.2 pg (ref 26.0–34.0)
MCHC: 33.1 g/dL (ref 30.0–36.0)
MCV: 88.3 fL (ref 80.0–100.0)
Platelets: 216 10*3/uL (ref 150–400)
RBC: 4.72 MIL/uL (ref 4.22–5.81)
RDW: 14.6 % (ref 11.5–15.5)
WBC: 4.6 10*3/uL (ref 4.0–10.5)
nRBC: 0 % (ref 0.0–0.2)

## 2018-12-06 LAB — SARS CORONAVIRUS 2 (TAT 6-24 HRS): SARS Coronavirus 2: NEGATIVE

## 2018-12-06 LAB — RAPID URINE DRUG SCREEN, HOSP PERFORMED
Amphetamines: NOT DETECTED
Barbiturates: NOT DETECTED
Benzodiazepines: NOT DETECTED
Cocaine: NOT DETECTED
Opiates: NOT DETECTED
Tetrahydrocannabinol: NOT DETECTED

## 2018-12-06 LAB — ETHANOL: Alcohol, Ethyl (B): <10 mg/dL (ref <10)

## 2018-12-06 MED ORDER — OLANZAPINE 5 MG PO TBDP
10.0000 mg | ORAL_TABLET | Freq: Three times a day (TID) | ORAL | Status: DC | PRN
  Filled 2018-12-06: qty 2

## 2018-12-06 MED ORDER — HYDROCORTISONE ACETATE 25 MG RE SUPP
25.0000 mg | Freq: Once | RECTAL | Status: AC
  Administered 2018-12-06: 13:00:00 25 mg via RECTAL
  Filled 2018-12-06: qty 1

## 2018-12-06 MED ORDER — ZIPRASIDONE MESYLATE 20 MG IM SOLR: 20.0000 mg | INTRAMUSCULAR | Status: DC | PRN

## 2018-12-06 MED ORDER — LOSARTAN POTASSIUM 50 MG PO TABS
25.0000 mg | ORAL_TABLET | Freq: Every day | ORAL | Status: DC
  Administered 2018-12-06 – 2018-12-09 (×4): 25 mg via ORAL
  Filled 2018-12-06 (×4): qty 1

## 2018-12-06 MED ORDER — LORAZEPAM 1 MG PO TABS
1.0000 mg | ORAL_TABLET | ORAL | Status: AC | PRN
  Administered 2018-12-06: 1 mg via ORAL
  Filled 2018-12-06: qty 1

## 2018-12-06 MED ORDER — TAMSULOSIN HCL 0.4 MG PO CAPS
0.4000 mg | ORAL_CAPSULE | Freq: Every day | ORAL | Status: DC
  Administered 2018-12-06 – 2018-12-09 (×4): 0.4 mg via ORAL
  Filled 2018-12-06 (×4): qty 1

## 2018-12-06 MED ORDER — AMLODIPINE BESYLATE 5 MG PO TABS
10.0000 mg | ORAL_TABLET | Freq: Every day | ORAL | Status: DC
  Administered 2018-12-06 – 2018-12-09 (×3): 10 mg via ORAL
  Filled 2018-12-06 (×4): qty 2

## 2018-12-06 MED ORDER — OLANZAPINE 5 MG PO TABS
10.0000 mg | ORAL_TABLET | Freq: Every day | ORAL | Status: DC
  Administered 2018-12-06 – 2018-12-08 (×3): 10 mg via ORAL
  Filled 2018-12-06 (×2): qty 2
  Filled 2018-12-06: qty 1
  Filled 2018-12-06: qty 2

## 2018-12-06 MED ORDER — DIVALPROEX SODIUM 250 MG PO DR TAB
500.0000 mg | DELAYED_RELEASE_TABLET | Freq: Two times a day (BID) | ORAL | Status: DC
  Administered 2018-12-06 – 2018-12-09 (×6): 500 mg via ORAL
  Filled 2018-12-06 (×7): qty 2

## 2018-12-06 NOTE — ED Notes (Signed)
Lunch ordered 

## 2018-12-06 NOTE — ED Notes (Signed)
Pt. stated there are people out to kill him, and he can see them. This NT removed all cords from the room.

## 2018-12-06 NOTE — Progress Notes (Signed)
Patient meets criteria for inpatient treatment per Lindon Romp, NP. CSW faxed referrals to the following facilities for review:   [facility]   [facility]   [facility]   [facility]   [facility]   [facility]     TSS will continue to seek bed placement.   Darletta Moll MSW, [place] Worker Disposition  [facility] Ph: [phone] Fax: [phone]

## 2018-12-06 NOTE — ED Notes (Signed)
Pt stated that he has seen family members here in hospital and that they are trying to kill him and telling him to kill himself.

## 2018-12-06 NOTE — ED Notes (Signed)
Pt eating snack given. Pt verbalized understanding of Medical Clearance Pt Policy - copy given. Pt given pencil and paper as requested. Pt voiced understanding and agreement w/tx plan - Inpt. Offered pt Zyprexa d/t pt has been ambulating to bathroom multiple time - somewhat restless noted - pt declined.

## 2018-12-06 NOTE — ED Provider Notes (Signed)
[facility] Provider Note   CSN: [id number] Arrival date & time: 12/06/18  0451     History   Chief Complaint Chief Complaint  Patient presents with  . Hallucinations    HPI Jeremiah Hunter is a 58 y.o. male.     Patient presents to the ER for psychiatric evaluation.  He was brought here by his sister.  Patient reports that he was sitting on the edge of his bed tonight with all of his pills and was going to to overdose.  He reports that he has been hearing voices that are telling him to kill himself.  He also is concerned that multiple people are after him, trying to kill him.     Past Medical History:  Diagnosis Date  . Enlarged prostate   . Hypertension     There are no active problems to display for this patient.   Past Surgical History:  Procedure Laterality Date  . CYSTECTOMY    . HERNIA REPAIR          Home Medications    Prior to Admission medications   Medication Sig Start Date End Date Taking? Authorizing Provider  amLODipine (NORVASC) 10 MG tablet Take 1 tablet (10 mg total) by mouth daily. To lower blood pressure 10/08/18   Fulp, Cammie, MD  Cholecalciferol (VITAMIN D3) 50 MCG (2000 UT) capsule Take 2,000 Units by mouth daily. 08/07/18   [provider]  divalproex (DEPAKOTE) 500 MG DR tablet Take 1 tablet (500 mg total) by mouth 2 (two) times daily. 11/09/18   Starkes-Perry, Juel Burrowakia S, FNP  hydrocortisone (ANUSOL-HC) 25 MG suppository Place 25 mg rectally 2 (two) times daily as needed for hemorrhoids or anal itching.    [provider]  hydrocortisone 2.5 % cream Apply 1 application topically 2 (two) times daily as needed (hemorrhoid).  08/23/18   [provider]  loratadine (CLARITIN) 10 MG tablet Take 10 mg by mouth daily. 08/07/18   [provider]  losartan (COZAAR) 25 MG tablet Take 1 tablet (25 mg total) by mouth daily. To lower blood pressure 10/08/18   Fulp, Cammie, MD   OLANZapine (ZYPREXA) 10 MG tablet Take 1 tablet (10 mg total) by mouth at bedtime. 11/09/18   Starkes-Perry, Juel Burrowakia S, FNP  tamsulosin (FLOMAX) 0.4 MG CAPS capsule Take 0.4 mg by mouth daily. 08/25/18   [provider]    Family History Family History  Family history unknown: Yes    Social History Social History   Tobacco Use  . Smoking status: Never Smoker  . Smokeless tobacco: Never Used  Substance Use Topics  . Alcohol use: Never    Frequency: Never  . Drug use: Never     Allergies   Patient has no known allergies.   Review of Systems Review of Systems  Psychiatric/Behavioral: Positive for dysphoric mood, hallucinations and suicidal ideas.  All other systems reviewed and are negative.    Physical Exam Updated Vital Signs BP (!) 145/100 (BP Location: Right Arm)   Pulse (!) 101   Temp 98.3 F (36.8 C) (Oral)   Resp 16   SpO2 96%   Physical Exam Vitals signs and nursing note reviewed.  Constitutional:      General: He is not in acute distress.    Appearance: Normal appearance. He is well-developed.  HENT:     Head: Normocephalic and atraumatic.     Right Ear: Hearing normal.     Left Ear: Hearing normal.  Nose: Nose normal.  Eyes:     Conjunctiva/sclera: Conjunctivae normal.     Pupils: Pupils are equal, round, and reactive to light.  Neck:     Musculoskeletal: Normal range of motion and neck supple.  Cardiovascular:     Rate and Rhythm: Regular rhythm.     Heart sounds: S1 normal and S2 normal. No murmur. No friction rub. No gallop.   Pulmonary:     Effort: Pulmonary effort is normal. No respiratory distress.     Breath sounds: Normal breath sounds.  Chest:     Chest wall: No tenderness.  Abdominal:     General: Bowel sounds are normal.     Palpations: Abdomen is soft.     Tenderness: There is no abdominal tenderness. There is no guarding or rebound. Negative signs include Murphy's sign and McBurney's sign.     Hernia: No hernia is  present.  Musculoskeletal: Normal range of motion.  Skin:    General: Skin is warm and dry.     Findings: No rash.  Neurological:     Mental Status: He is alert and oriented to person, place, and time.     GCS: GCS eye subscore is 4. GCS verbal subscore is 5. GCS motor subscore is 6.     Cranial Nerves: No cranial nerve deficit.     Sensory: No sensory deficit.     Coordination: Coordination normal.  Psychiatric:        Speech: Speech is delayed.        Behavior: Behavior is slowed and withdrawn.        Thought Content: Thought content is paranoid and delusional. Thought content includes suicidal ideation. Thought content includes suicidal plan.      ED Treatments / Results  Labs (all labs ordered are listed, but only abnormal results are displayed) Labs Reviewed  COMPREHENSIVE METABOLIC PANEL - Abnormal; Notable for the following components:      Result Value   BUN 21 (*)    All other components within normal limits  SARS CORONAVIRUS 2 (TAT 6-24 HRS)  ETHANOL  CBC  RAPID URINE DRUG SCREEN, HOSP PERFORMED    EKG None  Radiology No results found.  Procedures Procedures (including critical care time)  Medications Ordered in ED Medications  amLODipine (NORVASC) tablet 10 mg (has no administration in time range)  divalproex (DEPAKOTE) DR tablet 500 mg (has no administration in time range)  losartan (COZAAR) tablet 25 mg (has no administration in time range)  OLANZapine (ZYPREXA) tablet 10 mg (has no administration in time range)  tamsulosin (FLOMAX) capsule 0.4 mg (has no administration in time range)  OLANZapine zydis (ZYPREXA) disintegrating tablet 10 mg (has no administration in time range)    And  LORazepam (ATIVAN) tablet 1 mg (has no administration in time range)    And  ziprasidone (GEODON) injection 20 mg (has no administration in time range)     Initial Impression / Assessment and Plan / ED Course  I have reviewed the triage vital signs and the nursing  notes.  Pertinent labs & imaging results that were available during my care of the patient were reviewed by me and considered in my medical decision making (see chart for details).        Patient presents with complaints of auditory and visual hallucinations with paranoia.  Patient threatening to overdose at home earlier tonight, brought to ER by family.  Will require inpatient psychiatric treatment.  Patient medically clear for treatment.  Final Clinical  Impressions(s) / ED Diagnoses   Final diagnoses:  Hallucinations  Suicidal ideation    ED Discharge Orders    None       Aldean Suddeth, Canary Brim, MD 12/06/18 [id number]

## 2018-12-06 NOTE — ED Notes (Signed)
Pt speaking with therapist on TTS
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
## 2018-12-06 NOTE — ED Notes (Addendum)
Pt sleeping. Will give night meds when he wakes up. Resp even and non-labored.

## 2018-12-06 NOTE — ED Notes (Signed)
Pt refuses to take his night time dose of Depakote. States that he only takes 500mg  once a day and will not take any more. Pt very paranoid, looking at the pills. Explained what each pill was and what it was for. Pt looked at this RN for a few seconds and back at the pills several times, then took the medication. States that he is fine, but demanded that we leave the lights on. Sitter at bedside. Will continue to monitor.

## 2018-12-06 NOTE — ED Notes (Signed)
Pt arrived to Rm 52 - wearing burgundy scrubs. Sitter w/pt. Pt noted to be ambulatory to bathroom then back to room multiple times - states he has urgency and this is normal for him. Pt noted to be calm, cooperative.

## 2018-12-06 NOTE — ED Triage Notes (Signed)
Patient here with hallucinations.  He states that he is hearing voices to take pills and kill himself.  He is also having visual hallucinations.  Patient is tearful in triage. He stated "I don't want to die".

## 2018-12-07 MED ORDER — HYDROCORTISONE ACETATE 25 MG RE SUPP
25.0000 mg | Freq: Once | RECTAL | Status: AC
  Administered 2018-12-07: 25 mg via RECTAL
  Filled 2018-12-07: qty 1

## 2018-12-07 NOTE — ED Notes (Signed)
Breakfast Ordered 

## 2018-12-07 NOTE — ED Notes (Signed)
Re-TTS being performed.  

## 2018-12-07 NOTE — ED Notes (Signed)
Patient on the phone return family call-Monique,RN

## 2018-12-07 NOTE — BHH Counselor (Signed)
Disposition:   Priscille Loveless, NP, patient continue to be recommend for inpatient treatment
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
## 2018-12-07 NOTE — ED Notes (Signed)
Pt brushing his teeth.

## 2018-12-07 NOTE — ED Notes (Signed)
Pt initially refusing Depakote - states "I only takes this once a day at night". Pt had refused med last night. Advised pt, per his home med list, Depakote 500mg  is scheduled BID. Pt took all meds after much encouragement. Pt lying on bed watching tv.

## 2018-12-07 NOTE — BHH Counselor (Signed)
Re-assessed:   Patient initially came to the ED 12/06/2018 for suicidal ideations with a plan to overdose.   Patient was eating breakfast during assessment. Patient report he continues to be feel depressed and suicidal. Patient present the delusionals as he stated stating people are out to get me, they are everywhere. Patient has history of depression with psychotic features. Per chart note patient missed a dose of his medication yesterday.

## 2018-12-07 NOTE — ED Notes (Signed)
Pt in shower.  

## 2018-12-08 NOTE — ED Notes (Signed)
Patient made phone call-Monique,RN

## 2018-12-08 NOTE — BH Assessment (Addendum)
Pt has been accepted to [facility] in the [facility]. The time in which he can be accepted is to be worked out between the nurses of both hospitals. This information was provided to pt's nurse, Monique RN, at 2343.  Room: TBD Accepting: Dr. Victor Rosado Attending: Dr. Victor Rosado Call to Report: [phone]  Address: [address] [place], [place] [address] 

## 2018-12-08 NOTE — ED Notes (Addendum)
Pt has been accepted to [facility] in the [facility]. The time in which he can be accepted is to be worked out between the nurses of both hospitals. This information was provided to pt's nurse, Beckie Busing RN, at [phone].  Room: TBD Accepting: Dr. Brantley Fling Attending: Dr. Brantley Fling Call to Report: [phone]  Address: [address] [place], [place] [address]

## 2018-12-08 NOTE — ED Notes (Signed)
Breakfast ordered for patient  

## 2018-12-08 NOTE — BH Assessment (Addendum)
[facility] Assessment Progress Note Patient continues per Dwyane Dee MD to meet inpatient criteria this date as appropriate bed placement is investigated.

## 2018-12-08 NOTE — ED Notes (Signed)
[employer] [phone].

## 2018-12-09 NOTE — ED Notes (Signed)
Ordered breakfast--Jeremiah Hunter 

## 2018-12-09 NOTE — ED Notes (Signed)
Pt on phone at nurses' desk notifying family/friends of tx plan - accepted to [facility].

## 2018-12-09 NOTE — ED Provider Notes (Signed)
Patient accepted to [facility].  Is awake, alert, and stable for transfer.   Sherwood Gambler, MD 12/09/18 1044

## 2019-01-14 ENCOUNTER — Emergency Department (HOSPITAL_COMMUNITY)
Admission: EM | Admit: 2019-01-14 | Discharge: 2019-01-14 | Disposition: A | Discharge status: Home / Self Care | Payer: Medicaid Other | Attending: Emergency Medicine | Admitting: Emergency Medicine

## 2019-01-14 ENCOUNTER — Other Ambulatory Visit: Payer: Self-pay

## 2019-01-14 DIAGNOSIS — R519 Headache, unspecified: Secondary | ICD-10-CM | POA: Diagnosis not present

## 2019-01-14 DIAGNOSIS — I10 Essential (primary) hypertension: Secondary | ICD-10-CM | POA: Diagnosis not present

## 2019-01-14 DIAGNOSIS — Z79899 Other long term (current) drug therapy: Secondary | ICD-10-CM | POA: Diagnosis not present

## 2019-01-14 MED ORDER — LOSARTAN POTASSIUM 25 MG PO TABS: 25.0000 mg | ORAL_TABLET | Freq: Every day | ORAL | 0 refills | Status: DC

## 2019-01-14 MED FILL — LOSARTAN POTASSIUM 25 MG TA: 25 | 30 days supply | Qty: 30 | Fill #0

## 2019-01-14 NOTE — ED Triage Notes (Signed)
Pt presents to the ED after misplacing one of his blood pressure medications on Monday. Reports he hasn't taken any of his home medications since then. Pt states his sister checked his blood pressure yesterday and she said it was too high. Pt denying any pain or dizziness at this time. Reports he did have a headache this morning.

## 2019-01-14 NOTE — Discharge Instructions (Signed)
You were given 30 day supply of losartan. Please take it as prescribed.   Further refills need to be done at [facility]   Return to ER if you have chest pain, trouble breathing, weakness, numbness

## 2019-01-14 NOTE — ED Provider Notes (Signed)
[facility] - [place] EMERGENCY DEPARTMENT Provider Note   CSN: [id number] Arrival date & time: 01/14/19  3244     History No chief complaint on file.   Jeremiah Hunter is a 58 y.o. male hx of HTN, here presenting with hypertension. Patient has a history of hypertension and is on Norvasc and losartan .  He states that he still has Norvasc but he ran out of losartan about a week ago.  He states that his blood pressure was elevated yesterday but it was taken by his sister but he could not give me an exact number.  He tried to go to the [facility] to get his medication today and they are closed so he came here for evaluation .  Patient has a mild headache but denies any trouble speaking or weakness or numbness. Denies any chest pain or shortness of breath.   The history is provided by the patient.       Past Medical History:  Diagnosis Date  . Enlarged prostate   . Hypertension     There are no problems to display for this patient.   Past Surgical History:  Procedure Laterality Date  . CYSTECTOMY    . HERNIA REPAIR         Family History  Family history unknown: Yes    Social History   Tobacco Use  . Smoking status: Never Smoker  . Smokeless tobacco: Never Used  Substance Use Topics  . Alcohol use: Never  . Drug use: Never    Home Medications Prior to Admission medications   Medication Sig Start Date End Date Taking? Authorizing Provider  amLODipine (NORVASC) 10 MG tablet Take 1 tablet (10 mg total) by mouth daily. To lower blood pressure 10/08/18   Fulp, Cammie, MD  Cholecalciferol (VITAMIN D3) 50 MCG (2000 UT) capsule Take 2,000 Units by mouth daily. 08/07/18   [provider]  divalproex (DEPAKOTE) 500 MG DR tablet Take 1 tablet (500 mg total) by mouth 2 (two) times daily. 11/09/18   Starkes-Perry, Juel Burrow, FNP  hydrocortisone (ANUSOL-HC) 25 MG suppository Place 25 mg rectally 2 (two) times daily as needed for hemorrhoids or anal itching.    [provider]  hydrocortisone 2.5 % cream Apply 1 application topically 2 (two) times daily as needed (hemorrhoid).  08/23/18   [provider]  losartan (COZAAR) 25 MG tablet Take 1 tablet (25 mg total) by mouth daily. To lower blood pressure 10/08/18   Fulp, Cammie, MD  OLANZapine (ZYPREXA) 10 MG tablet Take 1 tablet (10 mg total) by mouth at bedtime. 11/09/18   Starkes-Perry, Juel Burrow, FNP  tamsulosin (FLOMAX) 0.4 MG CAPS capsule Take 0.4 mg by mouth daily. 08/25/18   [provider]    Allergies    Patient has no known allergies.  Review of Systems   Review of Systems  Neurological: Positive for headaches.  All other systems reviewed and are negative.   Physical Exam Updated Vital Signs BP 103/75 (BP Location: Right Arm)   Pulse 87   Temp 98.4 F (36.9 C) (Oral)   Resp 16   Ht 5\' 11"  (1.803 m)   Wt 104.3 kg   SpO2 98%   BMI 32.08 kg/m   Physical Exam Vitals and nursing note reviewed.  HENT:     Head: Normocephalic.     Nose: Nose normal.     Mouth/Throat:     Mouth: Mucous membranes are moist.  Eyes:     Extraocular Movements: Extraocular  movements intact.     Pupils: Pupils are equal, round, and reactive to light.  Cardiovascular:     Rate and Rhythm: Normal rate and regular rhythm.     Pulses: Normal pulses.     Heart sounds: Normal heart sounds.  Pulmonary:     Effort: Pulmonary effort is normal.     Breath sounds: Normal breath sounds.  Abdominal:     General: Abdomen is flat.     Palpations: Abdomen is soft.  Musculoskeletal:        General: Normal range of motion.     Cervical back: Normal range of motion.  Skin:    General: Skin is warm.     Capillary Refill: Capillary refill takes less than 2 seconds.  Neurological:     General: No focal deficit present.     Mental Status: He is alert and oriented to person, place, and time. Mental status is at baseline.     Comments: CN 2- 12 intact, nl strength throughout   Psychiatric:         Mood and Affect: Mood normal.        Behavior: Behavior normal.     ED Results / Procedures / Treatments   Labs (all labs ordered are listed, but only abnormal results are displayed) Labs Reviewed - No data to display  EKG None  Radiology No results found.  Procedures Procedures (including critical care time)  Medications Ordered in ED Medications - No data to display  ED Course  I have reviewed the triage vital signs and the nursing notes.  Pertinent labs & imaging results that were available during my care of the patient were reviewed by me and considered in my medical decision making (see chart for details).    MDM Rules/Calculators/A&P                     Jeremiah Hunter is a 58 y.o. male here with dizziness, ran out of BP meds. BP is 103/75 in the ED. Will consult case management for medication assistance.   9:55 AM Case management is able to enroll him in the medication match program. He is given 30 day supply of losartan. Stable for discharge.    Final Clinical Impression(s) / ED Diagnoses Final diagnoses:  None    Rx / DC Orders ED Discharge Orders    None       Drenda Freeze, MD 01/14/19 [phone]

## 2019-01-14 NOTE — ED Notes (Signed)
Patient verbalizes understanding of discharge instructions. Opportunity for questioning and answers were provided. Armband removed by staff, pt discharged from ED.  

## 2019-01-14 NOTE — Discharge Planning (Signed)
EDCM secured Rx from [facility] and delivered to [facility].

## 2019-03-05 ENCOUNTER — Other Ambulatory Visit: Payer: Self-pay | Admitting: Critical Care Medicine

## 2019-03-05 ENCOUNTER — Encounter: Payer: Self-pay | Admitting: Critical Care Medicine

## 2019-03-05 DIAGNOSIS — I10 Essential (primary) hypertension: Secondary | ICD-10-CM

## 2019-03-05 MED ORDER — OLANZAPINE 15 MG PO TABS: 15.0000 mg | ORAL_TABLET | Freq: Every day | ORAL | 0 refills | Status: DC

## 2019-03-05 MED ORDER — FUROSEMIDE 20 MG PO TABS: 20.0000 mg | ORAL_TABLET | Freq: Every day | ORAL | 0 refills | Status: DC

## 2019-03-05 MED ORDER — LOSARTAN POTASSIUM 25 MG PO TABS: 25.0000 mg | ORAL_TABLET | Freq: Every day | ORAL | 0 refills | Status: DC

## 2019-03-05 MED ORDER — AMLODIPINE BESYLATE 10 MG PO TABS: 10.0000 mg | ORAL_TABLET | Freq: Every day | ORAL | 5 refills | Status: DC

## 2019-03-05 NOTE — Congregational Nurse Program (Signed)
Saw Mr. Jeremiah [facility] at [facility] this afternoon by Dr. Delford Field. Made client aware that he will no longer have Ms. Placey as primary care giver due to his insurance status. Financial application given to be completed. Medications will be picked- up by CN and delivered tomorrow.

## 2019-03-06 ENCOUNTER — Other Ambulatory Visit: Payer: Self-pay | Admitting: *Deleted

## 2019-03-06 ENCOUNTER — Encounter: Payer: Self-pay | Admitting: Critical Care Medicine

## 2019-03-06 DIAGNOSIS — Z20822 Contact with and (suspected) exposure to covid-19: Secondary | ICD-10-CM

## 2019-03-06 NOTE — Progress Notes (Signed)
Patient ID: Jeremiah Hunter, male   DOB: 11/04/1960, 59 y.o.   MRN: [id number] This is a 59 year old male who just arrived at [place] house 2 weeks ago.  He has Medicaid at this time but is only family-planning.  He has been homeless since October 2020.  He is locally from [place].  He complains of foot swelling and right foot pain.    The patient was in the hospital for behavioral health reasons previously.  He currently has active prescriptions for Celexa 10 mg daily, Atarax 25 mg 4 times daily, hydrocal thiazide one half 25 mg daily and trazodone 50 mg daily he has run out of his losartan amlodipine and Zyprexa.  The patient has history of hypertension and is managed and seen at a mental health clinic in [place].  He previously saw a provider at [facility] but now has Medicaid and can no longer go to that clinic   Today the patient is in need of refills on his blood pressure medications and some of his psychiatric medications  On exam blood pressure 132/80 pulse is 89 saturation 96% chest was clear without evidence of wheeze rale or rhonchi cardiac exam showed a regular rate rhythm normal S1-S2 abdomen soft nontender extremities show dry feet with cracked heels and a callus over the base of the right great toe  Plan will be to administer for the patient foot cream for moisturization  We also [facility] amlodipine 10 mg daily furosemide 20 mg for 5 days and stop for pedal edema losartan 25 mg daily Zyprexa 15 mg at bedtime  I have indicated to my clinic for the patient to come in to establish for primary care at that site since he can no longer go to the [facility] clinic with his Medicaid status

## 2019-03-08 LAB — NOVEL CORONAVIRUS, NAA: SARS-CoV-2, NAA: NOT DETECTED

## 2019-03-10 ENCOUNTER — Encounter (HOSPITAL_COMMUNITY): Payer: Self-pay | Admitting: Emergency Medicine

## 2019-03-10 ENCOUNTER — Emergency Department (HOSPITAL_COMMUNITY)
Admission: EM | Admit: 2019-03-10 | Discharge: 2019-03-11 | Disposition: A | Discharge status: Home / Self Care | Payer: Medicaid Other | Attending: Emergency Medicine | Admitting: Emergency Medicine

## 2019-03-10 ENCOUNTER — Other Ambulatory Visit: Payer: Self-pay

## 2019-03-10 DIAGNOSIS — Z79899 Other long term (current) drug therapy: Secondary | ICD-10-CM | POA: Insufficient documentation

## 2019-03-10 DIAGNOSIS — R202 Paresthesia of skin: Secondary | ICD-10-CM | POA: Diagnosis not present

## 2019-03-10 DIAGNOSIS — R2243 Localized swelling, mass and lump, lower limb, bilateral: Secondary | ICD-10-CM | POA: Diagnosis present

## 2019-03-10 DIAGNOSIS — I10 Essential (primary) hypertension: Secondary | ICD-10-CM

## 2019-03-10 DIAGNOSIS — R609 Edema, unspecified: Secondary | ICD-10-CM

## 2019-03-10 LAB — CBC
HCT: 41 % (ref 39.0–52.0)
Hemoglobin: 13.5 g/dL (ref 13.0–17.0)
MCH: 29.5 pg (ref 26.0–34.0)
MCHC: 32.9 g/dL (ref 30.0–36.0)
MCV: 89.5 fL (ref 80.0–100.0)
Platelets: 177 10*3/uL (ref 150–400)
RBC: 4.58 MIL/uL (ref 4.22–5.81)
RDW: 15.2 % (ref 11.5–15.5)
WBC: 5.1 10*3/uL (ref 4.0–10.5)
nRBC: 0 % (ref 0.0–0.2)

## 2019-03-10 LAB — BRAIN NATRIURETIC PEPTIDE: B Natriuretic Peptide: 32.4 pg/mL (ref 0.0–100.0)

## 2019-03-10 LAB — COMPREHENSIVE METABOLIC PANEL
ALT: 20 U/L (ref 0–44)
AST: 28 U/L (ref 15–41)
Albumin: 3.6 g/dL (ref 3.5–5.0)
Alkaline Phosphatase: 92 U/L (ref 38–126)
Anion gap: 9 (ref 5–15)
BUN: 17 mg/dL (ref 6–20)
CO2: 27 mmol/L (ref 22–32)
Calcium: 8.8 mg/dL — ABNORMAL LOW (ref 8.9–10.3)
Chloride: 104 mmol/L (ref 98–111)
Creatinine, Ser: 1.17 mg/dL (ref 0.61–1.24)
GFR calc Af Amer: >60 mL/min (ref >60)
GFR calc non Af Amer: >60 mL/min (ref >60)
Glucose, Bld: 99 mg/dL (ref 70–99)
Potassium: 3.8 mmol/L (ref 3.5–5.1)
Sodium: 140 mmol/L (ref 135–145)
Total Bilirubin: 0.7 mg/dL (ref 0.3–1.2)
Total Protein: 6.5 g/dL (ref 6.5–8.1)

## 2019-03-10 MED ORDER — FUROSEMIDE 20 MG PO TABS: 20.0000 mg | ORAL_TABLET | Freq: Every day | ORAL | 0 refills | Status: DC

## 2019-03-10 MED ORDER — LOSARTAN POTASSIUM 50 MG PO TABS: 50.0000 mg | ORAL_TABLET | Freq: Every day | ORAL | 1 refills | Status: DC

## 2019-03-10 NOTE — Discharge Instructions (Signed)
  Please stop taking the blood pressure medication immediately called amlodipine this medicine is likely causing your swelling of the legs. Start taking Lasix once a day for the next 10 days and then have your family doctor recheck you. Take an increased dose of losartan, I have given you a prescription for 50 mg tablets.  Take 1 a day instead of the 25 mg tablets which you currently take.  All of your testing today was normal  Seek medical exam for severe or worsening symptoms.

## 2019-03-10 NOTE — ED Provider Notes (Addendum)
[place] Provider Note   CSN: [id number] Arrival date & time: 03/10/19  1743     History Chief Complaint  Patient presents with  . Foot Swelling    Jeremiah Hunter is a 59 y.o. male.  HPI   This patient is a 59 year old male, history of hypertension for which she takes amlodipine, he has also recently been prescribed some furosemide for some peripheral edema.  This edema is bilateral, it is located below the knees.  He relates that this started sometime after being discharged from [facility] [place].  He has not had any other changes in his medications other than having the Lasix added for the last 5 days but states it has not really helped and he continues to have a fullness and swelling in his feet with tingling in his toes due to the swelling.  He denies shortness of breath coughing abdominal pain lightheadedness or any other symptoms.  He has seen his family doctor who gave him the furosemide but he has not followed up since that time.  Past Medical History:  Diagnosis Date  . Enlarged prostate   . Hypertension     There are no problems to display for this patient.   Past Surgical History:  Procedure Laterality Date  . CYSTECTOMY    . HERNIA REPAIR         Family History  Family history unknown: Yes    Social History   Tobacco Use  . Smoking status: Never Smoker  . Smokeless tobacco: Never Used  Substance Use Topics  . Alcohol use: Never  . Drug use: Never    Home Medications Prior to Admission medications   Medication Sig Start Date End Date Taking? Authorizing Provider  Cholecalciferol (VITAMIN D3) 50 MCG (2000 UT) capsule Take 2,000 Units by mouth daily. 08/07/18   [provider]  divalproex (DEPAKOTE) 500 MG DR tablet Take 1 tablet (500 mg total) by mouth 2 (two) times daily. 11/09/18   Starkes-Perry, Gayland Curry, FNP  furosemide (LASIX) 20 MG tablet Take 1 tablet (20 mg total) by  mouth daily for 10 days. 03/10/19 03/20/19  Noemi Chapel, MD  hydrocortisone (ANUSOL-HC) 25 MG suppository Place 25 mg rectally 2 (two) times daily as needed for hemorrhoids or anal itching.    [provider]  hydrocortisone 2.5 % cream Apply 1 application topically 2 (two) times daily as needed (hemorrhoid).  08/23/18   [provider]  losartan (COZAAR) 50 MG tablet Take 1 tablet (50 mg total) by mouth daily. To lower blood pressure 03/10/19 04/09/19  Noemi Chapel, MD  OLANZapine (ZYPREXA) 15 MG tablet Take 1 tablet (15 mg total) by mouth at bedtime. 03/05/19   [name], MD  tamsulosin (FLOMAX) 0.4 MG CAPS capsule Take 0.4 mg by mouth daily. 08/25/18   [provider]  traZODone (DESYREL) 50 MG tablet Take 50 mg by mouth at bedtime. 02/25/19   [provider]  amLODipine (NORVASC) 10 MG tablet Take 1 tablet (10 mg total) by mouth daily. To lower blood pressure 03/05/19 03/10/19  [name], MD    Allergies    Patient has no known allergies.  Review of Systems   Review of Systems  All other systems reviewed and are negative.   Physical Exam Updated Vital Signs BP 112/73 (BP Location: Left Arm)   Pulse 74   Temp 98.3 F (36.8 C) (Oral)   Resp 18   Ht 1.803 m (  5\' 11" )   Wt 111.1 kg   SpO2 98%   BMI 34.17 kg/m   Physical Exam Vitals and nursing note reviewed.  Constitutional:      General: He is not in acute distress.    Appearance: He is well-developed.  HENT:     Head: Normocephalic and atraumatic.     Mouth/Throat:     Pharynx: No oropharyngeal exudate.  Eyes:     General: No scleral icterus.       Right eye: No discharge.        Left eye: No discharge.     Conjunctiva/sclera: Conjunctivae normal.     Pupils: Pupils are equal, round, and reactive to light.  Neck:     Thyroid: No thyromegaly.     Vascular: No JVD.  Cardiovascular:     Rate and Rhythm: Normal rate and regular rhythm.     Heart sounds: Normal heart sounds. No  murmur. No friction rub. No gallop.   Pulmonary:     Effort: Pulmonary effort is normal. No respiratory distress.     Breath sounds: Normal breath sounds. No wheezing or rales.  Abdominal:     General: Bowel sounds are normal. There is no distension.     Palpations: Abdomen is soft. There is no mass.     Tenderness: There is no abdominal tenderness.  Musculoskeletal:        General: No tenderness. Normal range of motion.     Cervical back: Normal range of motion and neck supple.     Right lower leg: Edema present.     Left lower leg: Edema present.     Comments: Symmetrical edema below the knees bilaterally, 3+ at the feet  Lymphadenopathy:     Cervical: No cervical adenopathy.  Skin:    General: Skin is warm and dry.     Findings: No erythema or rash.  Neurological:     Mental Status: He is alert.     Coordination: Coordination normal.     Comments: Normal speech coordination and gait  Psychiatric:        Behavior: Behavior normal.     ED Results / Procedures / Treatments   Labs (all labs ordered are listed, but only abnormal results are displayed) Labs Reviewed  COMPREHENSIVE METABOLIC PANEL - Abnormal; Notable for the following components:      Result Value   Calcium 8.8 (*)    All other components within normal limits  CBC  BRAIN NATRIURETIC PEPTIDE  URINALYSIS, ROUTINE W REFLEX MICROSCOPIC    EKG None  Radiology No results found.  Procedures Procedures (including critical care time)  Medications Ordered in ED Medications - No data to display  ED Course  I have reviewed the triage vital signs and the nursing notes.  Pertinent labs & imaging results that were available during my care of the patient were reviewed by me and considered in my medical decision making (see chart for details).    MDM Rules/Calculators/A&P                      I suspect that the patient's edema is secondary to the amlodipine that he takes however this may be multifactorial.   We will check his albumin, less likely to be related to heart failure, check urinalysis for protein, the patient is agreeable.  Vital signs are unremarkable, CBC is also unremarkable and reassuring.  Patient agreeable to the plan  Patient will be given 10 days of  Lasix, metabolic panel BMP and CBC are all normal.  Suspect amlodipine is the offending agent.  Patient stopped and double dose of losartan to make up for it.  Final Clinical Impression(s) / ED Diagnoses Final diagnoses:  Peripheral edema    Rx / DC Orders ED Discharge Orders         Ordered    furosemide (LASIX) 20 MG tablet  Daily     03/10/19 2257    losartan (COZAAR) 50 MG tablet  Daily    Note to Pharmacy: Congregational nurse   03/10/19 2257           Eber Hong, MD 03/10/19 2258    Eber Hong, MD 03/10/19 2259

## 2019-03-10 NOTE — ED Triage Notes (Signed)
Pt in POV. Reports foot swelling X few days. Pt was seen at [facility] 2/11, given Rx for Lasix. Pt has seen no improvement.

## 2019-03-11 MED ORDER — LOSARTAN POTASSIUM 50 MG PO TABS: 50.0000 mg | ORAL_TABLET | Freq: Every day | ORAL | 1 refills | Status: DC

## 2019-03-11 MED ORDER — FUROSEMIDE 20 MG PO TABS: 20.0000 mg | ORAL_TABLET | Freq: Every day | ORAL | 0 refills | Status: DC

## 2019-03-12 ENCOUNTER — Encounter: Payer: Self-pay | Admitting: Critical Care Medicine

## 2019-03-12 ENCOUNTER — Other Ambulatory Visit: Payer: Self-pay | Admitting: Critical Care Medicine

## 2019-03-12 DIAGNOSIS — I10 Essential (primary) hypertension: Secondary | ICD-10-CM

## 2019-03-12 MED ORDER — LOSARTAN POTASSIUM-HCTZ 100-25 MG PO TABS: 1.0000 | ORAL_TABLET | Freq: Every day | ORAL | 1 refills | Status: DC

## 2019-03-12 MED ORDER — FUROSEMIDE 20 MG PO TABS: 20.0000 mg | ORAL_TABLET | Freq: Every day | ORAL | 0 refills | Status: DC

## 2019-03-12 MED ORDER — LOSARTAN POTASSIUM 50 MG PO TABS: 50.0000 mg | ORAL_TABLET | Freq: Every day | ORAL | 1 refills | Status: DC

## 2019-03-12 MED ORDER — CITALOPRAM HYDROBROMIDE 20 MG PO TABS: 20.0000 mg | ORAL_TABLET | Freq: Every day | ORAL | 3 refills | Status: DC

## 2019-03-12 MED FILL — CITALOPRAM HBR 20 MG TABLET: 20 | 30 days supply | Qty: 30 | Fill #0

## 2019-03-12 MED FILL — LOSARTAN-HCTZ 100-25 MG TAB: 100-25 | 30 days supply | Qty: 30 | Fill #0

## 2019-03-12 MED FILL — FUROSEMIDE 20 MG TABS: 20 | 10 days supply | Qty: 10 | Fill #0

## 2019-03-12 NOTE — Progress Notes (Signed)
Needs meds for HTN and edema

## 2019-03-12 NOTE — Progress Notes (Signed)
Refill meds

## 2019-03-12 NOTE — Progress Notes (Signed)
Patient ID: Jeremiah Hunter, male   DOB: [birth year], 59 y.o.   MRN: [id number] I spoke to the [facility]  The patient does not have medicaid and is still active with his care.  She will manage his HTN.   Jeremiah Hunter

## 2019-03-13 NOTE — Progress Notes (Signed)
Patient ID: Jeremiah Hunter, male   DOB: 14-Feb-1960, 59 y.o.   MRN: [id number]  This is a 59 year old male seen in the [facility].  Patient recently was here for a week ago and we gave him amlodipine 10 mg daily for his hypertension along with losartan 25 mg daily and he was on the hydrochlorthiazide 12.5 mg daily  The patient presented to the emergency room then on February 16 with increased swelling in the lower extremities and pain in the feet.  Evaluation there showed no evidence of heart failure normal metabolic panel and blood counts.  The emergency room physician discontinue the amlodipine and recommended losartan increased dose to 50 mg daily and furosemide 20 mg daily for 10 days  Patient comes in today for further follow-up it turns out the patient has also been seeing Steward Drone in the [facility] clinic.  The last time we saw the patient he indicated he had Medicaid and no longer needed to see Ms. Placey.  On exam the lower extremity edema is still persisting blood pressure was 132/80 chest was clear cardiac exam was unremarkable  I did have a conversation with Ms. Hilbert Corrigan who indicates she is still willing to see the patient and was going to get his medications filled at the health department.  Note the patient was not able to obtain the medication changes that occurred in the emergency room.  The patient indicated to me that he spoke to Ms. Placey and requested to be transferred to our care and no longer go to the [facility].  He states transportation barriers prohibits him from getting to the health department  I discussed this further with Ms. Hilbert Corrigan and we agreed that he would be transferred.  I then wrote a prescription for losartan 100 mg/25 hydrochlor thiazide daily I filled his Lasix 20 mg daily, I also increased his citalopram to 20 mg daily for depression  I obtained those prescriptions and brought them to him later in the day at the [facility]  We will follow-up this  patient again

## 2019-03-16 ENCOUNTER — Encounter: Payer: Self-pay | Admitting: Critical Care Medicine

## 2019-03-16 ENCOUNTER — Telehealth: Payer: Self-pay | Admitting: Critical Care Medicine

## 2019-03-16 NOTE — Telephone Encounter (Signed)
This patient called in today and is complaining of increased left foot pain and would like an in office exam and to establish here in the clinic  I will request to see if we get this patient added onto my schedule next week

## 2019-03-16 NOTE — Telephone Encounter (Signed)
Called the pt and added the pt

## 2019-03-19 ENCOUNTER — Other Ambulatory Visit: Payer: Self-pay | Admitting: Critical Care Medicine

## 2019-03-19 ENCOUNTER — Encounter: Payer: Self-pay | Admitting: Critical Care Medicine

## 2019-03-19 MED ORDER — CITALOPRAM HYDROBROMIDE 20 MG PO TABS: 20.0000 mg | ORAL_TABLET | Freq: Every day | ORAL | 3 refills | Status: DC

## 2019-03-19 NOTE — Progress Notes (Signed)
Refills

## 2019-03-19 NOTE — Congregational Nurse Program (Signed)
Jeremiah Hunter was seen at [facility] walk-in clinic for follow-up by Dr.Wright. Will be seen for continued follow-up at [facility]. Next appointment 3/2 @9a .
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
## 2019-03-24 ENCOUNTER — Encounter: Payer: Self-pay | Admitting: Critical Care Medicine

## 2019-03-24 ENCOUNTER — Ambulatory Visit: Payer: Medicaid Other | Attending: Critical Care Medicine | Admitting: Critical Care Medicine

## 2019-03-24 ENCOUNTER — Other Ambulatory Visit: Payer: Self-pay

## 2019-03-24 VITALS — BP 100/66 | HR 82 | Temp 98.1°F | Resp 16 | Ht 71.0 in | Wt 242.0 lb

## 2019-03-24 DIAGNOSIS — G4710 Hypersomnia, unspecified: Secondary | ICD-10-CM | POA: Diagnosis not present

## 2019-03-24 DIAGNOSIS — G40909 Epilepsy, unspecified, not intractable, without status epilepticus: Secondary | ICD-10-CM

## 2019-03-24 DIAGNOSIS — I10 Essential (primary) hypertension: Secondary | ICD-10-CM

## 2019-03-24 DIAGNOSIS — Z114 Encounter for screening for human immunodeficiency virus [HIV]: Secondary | ICD-10-CM

## 2019-03-24 DIAGNOSIS — F329 Major depressive disorder, single episode, unspecified: Secondary | ICD-10-CM

## 2019-03-24 DIAGNOSIS — Z1211 Encounter for screening for malignant neoplasm of colon: Secondary | ICD-10-CM

## 2019-03-24 DIAGNOSIS — F32A Depression, unspecified: Secondary | ICD-10-CM | POA: Insufficient documentation

## 2019-03-24 DIAGNOSIS — Z1159 Encounter for screening for other viral diseases: Secondary | ICD-10-CM

## 2019-03-24 DIAGNOSIS — R600 Localized edema: Secondary | ICD-10-CM | POA: Insufficient documentation

## 2019-03-24 DIAGNOSIS — M722 Plantar fascial fibromatosis: Secondary | ICD-10-CM

## 2019-03-24 DIAGNOSIS — F333 Major depressive disorder, recurrent, severe with psychotic symptoms: Secondary | ICD-10-CM

## 2019-03-24 DIAGNOSIS — G4733 Obstructive sleep apnea (adult) (pediatric): Secondary | ICD-10-CM | POA: Insufficient documentation

## 2019-03-24 DIAGNOSIS — N401 Enlarged prostate with lower urinary tract symptoms: Secondary | ICD-10-CM | POA: Diagnosis not present

## 2019-03-24 DIAGNOSIS — F419 Anxiety disorder, unspecified: Secondary | ICD-10-CM

## 2019-03-24 DIAGNOSIS — R350 Frequency of micturition: Secondary | ICD-10-CM

## 2019-03-24 HISTORY — DX: Major depressive disorder, recurrent, severe with psychotic symptoms: F33.3

## 2019-03-24 MED ORDER — CITALOPRAM HYDROBROMIDE 20 MG PO TABS: 20.0000 mg | ORAL_TABLET | Freq: Every day | ORAL | 3 refills | Status: DC

## 2019-03-24 MED ORDER — LOSARTAN POTASSIUM-HCTZ 100-25 MG PO TABS: 1.0000 | ORAL_TABLET | Freq: Every day | ORAL | 1 refills | Status: DC

## 2019-03-24 MED ORDER — OLANZAPINE 15 MG PO TABS: 15.0000 mg | ORAL_TABLET | Freq: Every day | ORAL | 3 refills | Status: DC

## 2019-03-24 MED ORDER — TAMSULOSIN HCL 0.4 MG PO CAPS: 0.4000 mg | ORAL_CAPSULE | Freq: Every day | ORAL | 3 refills | Status: DC

## 2019-03-24 MED FILL — OLANZapine 15 MG TABS: 15 | 30 days supply | Qty: 30 | Fill #0
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
## 2019-03-24 NOTE — Assessment & Plan Note (Signed)
Bilateral plantar fasciitis right greater than left  I have obtain for the patient a brand-new set of tennis shoes with orthotic insert as his former shoes were worn out and the soles had severely deteriorated  We will hold off on podiatry referral and x-rays of the right foot until we see how the patient responds to the new shoes

## 2019-03-24 NOTE — Assessment & Plan Note (Addendum)
Lower extremity edema is improved likely on the basis of hypertension and untreated sleep apnea for now we will discontinue furosemide

## 2019-03-24 NOTE — Assessment & Plan Note (Signed)
History of seizure disorder we have refilled the Depakote and will monitor

## 2019-03-24 NOTE — Assessment & Plan Note (Signed)
Benign prostatic hypertrophy will check PSA continue tamsulosin

## 2019-03-24 NOTE — Progress Notes (Signed)
C /o Right foot swelling X 2 yrs . Pain comes and goes particularly on the big toe Made Md aware of PHQ and GAD screening/ App made with [facility]

## 2019-03-24 NOTE — Assessment & Plan Note (Signed)
Increase hypersomnia with symptom complex compatible sleep apnea  Will obtain financial assistance the [place] discount and then from there hopefully obtain a sleep study

## 2019-03-24 NOTE — Assessment & Plan Note (Signed)
Essential hypertension now on losartan HCT with excellent control today blood pressure 100/66  We will check metabolic panel and thyroid panel at this visit

## 2019-03-24 NOTE — Assessment & Plan Note (Signed)
Psychotic features with severe recurrent depression in addition to Celexa will continue the Zyprexa 15 mg at bedtime

## 2019-03-24 NOTE — Patient Instructions (Addendum)
A tetanus vaccine was given  No change in medications  Labs today include metabolic panel blood counts PSA to check for prostate cancer HIV hepatitis C and thyroid panel, and urinalysis  An appointment with our licensed clinical social worker Jenel Lucks will be made  An appointment with Mikle Bosworth our financial counselor will be made  Return in follow-up with Dr. Delford Field in 6 weeks

## 2019-03-25 LAB — CBC WITH DIFFERENTIAL/PLATELET
Basophils Absolute: 0 10*3/uL (ref 0.0–0.2)
Basos: 1 %
EOS (ABSOLUTE): 0.1 10*3/uL (ref 0.0–0.4)
Eos: 2 %
Hematocrit: 39.6 % (ref 37.5–51.0)
Hemoglobin: 13.5 g/dL (ref 13.0–17.7)
Immature Grans (Abs): 0 10*3/uL (ref 0.0–0.1)
Immature Granulocytes: 0 %
Lymphocytes Absolute: 2.1 10*3/uL (ref 0.7–3.1)
Lymphs: 49 %
MCH: 30.3 pg (ref 26.6–33.0)
MCHC: 34.1 g/dL (ref 31.5–35.7)
MCV: 89 fL (ref 79–97)
Monocytes Absolute: 0.4 10*3/uL (ref 0.1–0.9)
Monocytes: 9 %
Neutrophils Absolute: 1.6 10*3/uL (ref 1.4–7.0)
Neutrophils: 39 %
Platelets: 233 10*3/uL (ref 150–450)
RBC: 4.46 x10E6/uL (ref 4.14–5.80)
RDW: 14.2 % (ref 11.6–15.4)
WBC: 4.3 10*3/uL (ref 3.4–10.8)

## 2019-03-25 LAB — MICROSCOPIC EXAMINATION
Bacteria, UA: NONE SEEN
Casts: NONE SEEN /lpf
Epithelial Cells (non renal): NONE SEEN /hpf (ref 0–10)
WBC, UA: NONE SEEN /hpf (ref 0–5)

## 2019-03-25 LAB — COMPREHENSIVE METABOLIC PANEL
ALT: 13 IU/L (ref 0–44)
AST: 19 IU/L (ref 0–40)
Albumin/Globulin Ratio: 1.8 (ref 1.2–2.2)
Albumin: 4.2 g/dL (ref 3.8–4.9)
Alkaline Phosphatase: 106 IU/L (ref 39–117)
BUN/Creatinine Ratio: 17 (ref 9–20)
BUN: 19 mg/dL (ref 6–24)
Bilirubin Total: 0.4 mg/dL (ref 0.0–1.2)
CO2: 23 mmol/L (ref 20–29)
Calcium: 9.4 mg/dL (ref 8.7–10.2)
Chloride: 104 mmol/L (ref 96–106)
Creatinine, Ser: 1.15 mg/dL (ref 0.76–1.27)
GFR calc Af Amer: 82 mL/min/{1.73_m2} (ref >59)
GFR calc non Af Amer: 71 mL/min/{1.73_m2} (ref >59)
Globulin, Total: 2.4 g/dL (ref 1.5–4.5)
Glucose: 94 mg/dL (ref 65–99)
Potassium: 4.2 mmol/L (ref 3.5–5.2)
Sodium: 139 mmol/L (ref 134–144)
Total Protein: 6.6 g/dL (ref 6.0–8.5)

## 2019-03-25 LAB — THYROID PANEL WITH TSH
Free Thyroxine Index: 1.7 (ref 1.2–4.9)
T3 Uptake Ratio: 25 % (ref 24–39)
T4, Total: 6.8 ug/dL (ref 4.5–12.0)
TSH: 2.17 u[IU]/mL (ref 0.450–4.500)

## 2019-03-25 LAB — URINALYSIS, COMPLETE
Bilirubin, UA: NEGATIVE
Glucose, UA: NEGATIVE
Ketones, UA: NEGATIVE
Leukocytes,UA: NEGATIVE
Nitrite, UA: NEGATIVE
Protein,UA: NEGATIVE
RBC, UA: NEGATIVE
Specific Gravity, UA: 1.017 (ref 1.005–1.030)
Urobilinogen, Ur: 0.2 mg/dL (ref 0.2–1.0)
pH, UA: 7.5 (ref 5.0–7.5)

## 2019-03-25 LAB — PSA: Prostate Specific Ag, Serum: 0.2 ng/mL (ref 0.0–4.0)

## 2019-03-25 LAB — HIV ANTIBODY (ROUTINE TESTING W REFLEX): HIV Screen 4th Generation wRfx: NONREACTIVE

## 2019-03-25 LAB — HEPATITIS C ANTIBODY (REFLEX): HCV Ab: <0.1 s/co ratio (ref 0.0–0.9)

## 2019-03-25 LAB — HCV COMMENT:

## 2019-03-27 ENCOUNTER — Other Ambulatory Visit: Payer: Self-pay | Admitting: Critical Care Medicine

## 2019-03-27 DIAGNOSIS — M79671 Pain in right foot: Secondary | ICD-10-CM

## 2019-03-27 MED FILL — TAMSULOSIN HCL 0.4 MG CAP: 0.4 | 30 days supply | Qty: 30 | Fill #0

## 2019-03-27 NOTE — Congregational Nurse Program (Signed)
Jeremiah Hunter was given bus pass for Monday 3/8 appointment at [facility] with clinical SW.

## 2019-03-27 NOTE — Progress Notes (Signed)
Xray for foot issues

## 2019-03-30 ENCOUNTER — Ambulatory Visit: Payer: Medicaid Other | Attending: Critical Care Medicine | Admitting: Licensed Clinical Social Worker

## 2019-03-30 ENCOUNTER — Ambulatory Visit (HOSPITAL_COMMUNITY)
Admission: RE | Admit: 2019-03-30 | Discharge: 2019-03-30 | Disposition: A | Discharge status: Home / Self Care | Payer: Medicaid Other | Source: Ambulatory Visit | Attending: Critical Care Medicine | Admitting: Critical Care Medicine

## 2019-03-30 ENCOUNTER — Other Ambulatory Visit: Payer: Self-pay

## 2019-03-30 DIAGNOSIS — F333 Major depressive disorder, recurrent, severe with psychotic symptoms: Secondary | ICD-10-CM | POA: Diagnosis not present

## 2019-03-30 DIAGNOSIS — M79671 Pain in right foot: Secondary | ICD-10-CM | POA: Diagnosis present

## 2019-03-30 DIAGNOSIS — F419 Anxiety disorder, unspecified: Secondary | ICD-10-CM

## 2019-03-30 NOTE — BH Specialist Note (Signed)
Integrated Behavioral Health Initial Visit  MRN: [id number] Name: Jeremiah Hunter  Number of Integrated Behavioral Health Clinician visits:: 1/6 Session Start time: 9:00 AM  Session End time: 9:30 AM Total time: 30  Type of Service: Integrated Behavioral Health- Individual Interpretor:No. Interpretor Name and Language: NA   SUBJECTIVE: Jeremiah Hunter is a 59 y.o. male accompanied by self Patient was referred by Dr. Delford Field for depression and anxiety. Patient reports the following symptoms/concerns: Pt reports being diagnosed with Bipolar Disorder and Schizophrenia. He reports difficulty managing feelings of sadness and paranoia triggered by extensive trauma Duration of problem: "Fifty-one years"; Severity of problem: severe  OBJECTIVE: Mood: Anxious and Pleasant and Affect: Appropriate and Tearful Risk of harm to self or others: No plan to harm self or others Pt reports hx of suicidal ideations that has resulted in inpatient tx at [facility] (Nov 2020) and [facility] (Jan 2021) Protective factors identified and crisis intervention resources provided  LIFE CONTEXT: Family and Social: Pt is currently homeless; however, receives strong support from family School/Work: Pt was recently approved for SSI and has an appt with Artist on 04/01/19 Self-Care: Pt participates in medication management and therapy through [facility] Life Changes: Pt has difficulty managing mental health triggered by psychosocial stressors  GOALS ADDRESSED: Patient will: 1. Reduce symptoms of: anxiety and depression 2. Increase knowledge and/or ability of: coping skills and healthy habits  3. Demonstrate ability to: Increase healthy adjustment to current life circumstances, Increase adequate support systems for patient/family and Increase motivation to adhere to plan of care  INTERVENTIONS: Interventions utilized: Solution-Focused Strategies, Supportive Counseling, Psychoeducation  and/or Health Education and Link to [employer]  Standardized Assessments completed: GAD-7 and PHQ 2&9  ASSESSMENT: Patient currently experiencing difficulty managing feelings of sadness and paranoia triggered by extensive trauma. Pt  reports hx of suicidal ideations that has resulted in inpatient tx at [facility] (Nov 2020) and [facility] (Jan 2021) Protective factors identified and crisis intervention resources provided.  Patient may benefit from continued medication management and therapy. He has upcoming appointments with [facility] (04/06/19) and [facility] (04/09/19) LCSW provided validation and support. Pt provided verbal consent for LCSW to complete a Transitions to [facility]) referral to assist patient with long-term housing.   PLAN: 1. Follow up with behavioral health clinician on : Contact LCSW with any additional behavioral health and/or resource needs 2. Behavioral recommendations: Utilize healthy coping skills discussed and continue compliance with medications and therapy in the community.  3. Referral(s): Integrated Art gallery manager (In Clinic) and [employer]:  Housing 4. "From scale of 1-10, how likely are you to follow plan?": 10  Bridgett Larsson, LCSW 03/30/2019 10:47 AM

## 2019-04-01 ENCOUNTER — Ambulatory Visit: Payer: No Typology Code available for payment source | Attending: Critical Care Medicine

## 2019-04-01 ENCOUNTER — Other Ambulatory Visit: Payer: Self-pay

## 2019-04-01 LAB — FECAL OCCULT BLOOD, IMMUNOCHEMICAL: Fecal Occult Bld: NEGATIVE

## 2019-04-02 ENCOUNTER — Other Ambulatory Visit: Payer: Self-pay | Admitting: Critical Care Medicine

## 2019-04-02 ENCOUNTER — Encounter: Payer: Self-pay | Admitting: Critical Care Medicine

## 2019-04-02 DIAGNOSIS — R3914 Feeling of incomplete bladder emptying: Secondary | ICD-10-CM

## 2019-04-02 DIAGNOSIS — M79671 Pain in right foot: Secondary | ICD-10-CM

## 2019-04-02 DIAGNOSIS — N401 Enlarged prostate with lower urinary tract symptoms: Secondary | ICD-10-CM

## 2019-04-02 DIAGNOSIS — M722 Plantar fascial fibromatosis: Secondary | ICD-10-CM

## 2019-04-02 MED ORDER — LOSARTAN POTASSIUM-HCTZ 100-25 MG PO TABS: 1.0000 | ORAL_TABLET | Freq: Every day | ORAL | 1 refills | Status: DC

## 2019-04-02 MED ORDER — CITALOPRAM HYDROBROMIDE 20 MG PO TABS: 20.0000 mg | ORAL_TABLET | Freq: Every day | ORAL | 3 refills | Status: DC

## 2019-04-02 MED ORDER — HYDROXYZINE HCL 25 MG PO TABS: 25.0000 mg | ORAL_TABLET | Freq: Three times a day (TID) | ORAL | 0 refills | Status: DC | PRN

## 2019-04-02 MED ORDER — HYDROCORTISONE (PERIANAL) 2.5 % EX CREA: 1.0000 "application " | TOPICAL_CREAM | Freq: Two times a day (BID) | CUTANEOUS | 0 refills | Status: DC

## 2019-04-02 MED FILL — LOSARTAN-HCTZ 100-25 MG TAB: 100-25 | 30 days supply | Qty: 30 | Fill #0

## 2019-04-02 MED FILL — hydrOXYzine HCL 25 MG TABS: 25 | 10 days supply | Qty: 30 | Fill #0

## 2019-04-02 MED FILL — CITALOPRAM HBR 20 MG TABLET: 20 | 30 days supply | Qty: 30 | Fill #0

## 2019-04-02 MED FILL — HYDROCORTISONE 2.5% CREAM: 2.5 | 7 days supply | Qty: 30 | Fill #0

## 2019-04-02 NOTE — Congregational Nurse Program (Signed)
Jeremiah Hunter seen at [facility] for referral to Urology,and  [facility]. C/O somatic itching, without visible signs of rash. Client given list of Dentist's that accept Medicaid. Client will pick-up medications at [facility]

## 2019-04-02 NOTE — Progress Notes (Signed)
Med refills

## 2019-04-03 ENCOUNTER — Other Ambulatory Visit: Payer: Self-pay | Admitting: Critical Care Medicine

## 2019-04-03 DIAGNOSIS — L299 Pruritus, unspecified: Secondary | ICD-10-CM

## 2019-04-03 NOTE — Progress Notes (Signed)
Patient ID: Jeremiah Hunter, male   DOB: 03-02-1960, 59 y.o.   MRN: [id number] This is a pleasant 59 year old male seen previously at the [facility] house shelter clinic here in follow-up he now has full Medicaid and wishes referrals to podiatry and urology.  He notes ongoing itching all over and occasional rash over his upper chest and legs and face.  He has occasional cramping in the calves as well.  He is in need for updated blood work and we will obtain a lab only visit for March 15.  He needs a dental urology and podiatry referral we will place these at this time.  He does have evidence for external hemorrhoids on exam and we will give him external rectal Anusol cream as well.

## 2019-04-06 ENCOUNTER — Other Ambulatory Visit: Payer: Self-pay | Admitting: Critical Care Medicine

## 2019-04-06 ENCOUNTER — Ambulatory Visit: Payer: Medicaid Other | Attending: Critical Care Medicine

## 2019-04-06 ENCOUNTER — Other Ambulatory Visit: Payer: Self-pay

## 2019-04-06 DIAGNOSIS — L299 Pruritus, unspecified: Secondary | ICD-10-CM

## 2019-04-06 MED ORDER — IBUPROFEN 600 MG PO TABS: 600.0000 mg | ORAL_TABLET | Freq: Three times a day (TID) | ORAL | 0 refills | Status: DC | PRN

## 2019-04-06 MED FILL — IBUPROFEN 600 MG TABLET: 600 | 20 days supply | Qty: 60 | Fill #0

## 2019-04-06 NOTE — Progress Notes (Signed)
Rx for ibuprofen for back pain

## 2019-04-08 LAB — EOSINOPHIL COUNT: EOS (ABSOLUTE): 0.1 10*3/uL (ref 0.0–0.4)

## 2019-04-08 LAB — IGE: IgE (Immunoglobulin E), Serum: 271 IU/mL (ref 6–495)

## 2019-04-08 LAB — C3 AND C4
Complement C3, Serum: 132 mg/dL (ref 82–167)
Complement C4, Serum: 28 mg/dL (ref 12–38)

## 2019-04-10 ENCOUNTER — Other Ambulatory Visit: Payer: Self-pay | Admitting: Critical Care Medicine

## 2019-04-10 DIAGNOSIS — G4710 Hypersomnia, unspecified: Secondary | ICD-10-CM

## 2019-04-16 ENCOUNTER — Encounter: Payer: Self-pay | Admitting: Critical Care Medicine

## 2019-04-16 ENCOUNTER — Ambulatory Visit: Payer: No Typology Code available for payment source | Admitting: Podiatry

## 2019-04-16 ENCOUNTER — Other Ambulatory Visit: Payer: Self-pay | Admitting: Podiatry

## 2019-04-16 ENCOUNTER — Other Ambulatory Visit: Payer: Self-pay

## 2019-04-16 ENCOUNTER — Ambulatory Visit (INDEPENDENT_AMBULATORY_CARE_PROVIDER_SITE_OTHER): Payer: Medicaid Other

## 2019-04-16 ENCOUNTER — Encounter: Payer: Self-pay | Admitting: Podiatry

## 2019-04-16 ENCOUNTER — Other Ambulatory Visit: Payer: Self-pay | Admitting: Critical Care Medicine

## 2019-04-16 VITALS — Temp 97.1°F

## 2019-04-16 DIAGNOSIS — G4710 Hypersomnia, unspecified: Secondary | ICD-10-CM

## 2019-04-16 DIAGNOSIS — M79671 Pain in right foot: Secondary | ICD-10-CM

## 2019-04-16 DIAGNOSIS — G629 Polyneuropathy, unspecified: Secondary | ICD-10-CM

## 2019-04-16 DIAGNOSIS — M778 Other enthesopathies, not elsewhere classified: Secondary | ICD-10-CM

## 2019-04-16 DIAGNOSIS — M7751 Other enthesopathy of right foot: Secondary | ICD-10-CM

## 2019-04-16 DIAGNOSIS — M779 Enthesopathy, unspecified: Secondary | ICD-10-CM

## 2019-04-16 MED ORDER — GABAPENTIN 300 MG PO CAPS: 300.0000 mg | ORAL_CAPSULE | Freq: Three times a day (TID) | ORAL | 3 refills | Status: DC

## 2019-04-16 NOTE — Progress Notes (Signed)
Sleep study needed 

## 2019-04-16 NOTE — Progress Notes (Signed)
Subjective:   Patient ID: Jeremiah Hunter, male   DOB: 59 y.o.   MRN: [id number]   HPI Patient presents stating that he is getting shooting pains across his toe right foot and it seems to be mostly when he is on his foot for periods of time.  Patient states it is sporadic seems to come and go but to be very bothersome for him and that it can be disruptive for sleep.  Patient does not smoke and would like to be more active   Review of Systems  All other systems reviewed and are negative.       Objective:  Physical Exam Vitals and nursing note reviewed.  Constitutional:      Appearance: He is well-developed.  Pulmonary:     Effort: Pulmonary effort is normal.  Musculoskeletal:        General: Normal range of motion.  Skin:    General: Skin is warm.  Neurological:     Mental Status: He is alert.     Vascular status intact neurologically mild diminishment sharp dull vibratory with patient found to have diminished range of motion subtalar joint bilateral.  No edema noted currently muscle strength found to be adequate and patient is found to have a nondescript pain across the right foot localized in nature with no 1 area of intense discomfort.  Patient has good digital perfusion well oriented x3     Assessment:  Possibility that we may be dealing with a neuropathy type condition here versus a inflammatory or arthritic condition agent     Plan:  PA x-rays reviewed condition discussed at great length Emina start him on gabapentin with education given to him today concerning gabapentin usage.  Patient understands there is no guarantees that this will help him and I do want him to clear it with his doctor who has him on Depakote prior to the initiation of the procedure and patient will do this and will start 1 at night if he tolerates it well and it helps he can add 1 in the morning but possibly 1 midday.  Encouraged him to let us know if any issues were to occur  X-rays indicate there is  no indications of stress fracture advanced arthritis

## 2019-04-17 NOTE — Progress Notes (Signed)
Patient ID: Jeremiah Hunter, male   DOB: Sep 02, 1960, 58 y.o.   MRN: [id number] This is a 59 year old male seen back in the [place] shelter clinic.  The patient just saw podiatry for his right foot pain and has been prescribed gabapentin.  He is requesting a urology referral and this is pending.  He also request a sleep study and this is already been ordered.  He notes increased snoring memory changes mild headaches.  He he notes increased choking the sleep and excess sleepiness during the day.  He does now have glasses and is able to read better with this.  Note he also still complains of itching and his allergy profile was negative and he states hydroxyzine has minimal help.  Note upon review of the losartan which is a new prescription for this patient it can cause itching and therefore of asked the patient to hold the losartan HCT for 5 days to see if the itching is reduced.  If it does not subside we may need to look into other factors.  Note today's blood pressure was 120/92 and pulse was 85.  Note he also has an upcoming dental appointment.  I also filled out his Medicaid managed care form for him and indicated which plans are best for him that are within the [facility] health system

## 2019-04-21 ENCOUNTER — Encounter: Payer: Self-pay | Admitting: Critical Care Medicine

## 2019-04-21 ENCOUNTER — Other Ambulatory Visit: Payer: Self-pay | Admitting: Critical Care Medicine

## 2019-04-21 MED ORDER — METOPROLOL-HCTZ ER 25-12.5 MG PO TB24: 1.0000 | ORAL_TABLET | Freq: Every day | ORAL | 3 refills | Status: DC

## 2019-04-21 NOTE — Progress Notes (Signed)
Allergy to losartan ,  Start metoprolol HCTZ daily.  Pt aware.

## 2019-04-23 ENCOUNTER — Telehealth: Payer: Self-pay | Admitting: Critical Care Medicine

## 2019-04-23 NOTE — Telephone Encounter (Signed)
This patient called me today and is asking I filled out an [place] form for him for [facility] in order that he may achieve housing.  He is currently at the [facility] clinic.  He has severe mental illness and would qualify for domiciliary status.  I am asking you Erskine Squibb if you could assist me when filling out the [place] 2 for this patient and I will be happy to sign his chart should be complete with all his records  Once completed we can email this to his caseworker at [facility]   [email or web address]

## 2019-04-27 ENCOUNTER — Telehealth: Payer: Self-pay

## 2019-04-27 NOTE — Telephone Encounter (Signed)
Completed FL2 sent via [employer] email to [email or web address]

## 2019-04-29 ENCOUNTER — Encounter: Payer: Self-pay | Admitting: Critical Care Medicine

## 2019-04-29 ENCOUNTER — Other Ambulatory Visit: Payer: Self-pay | Admitting: Critical Care Medicine

## 2019-04-29 DIAGNOSIS — R350 Frequency of micturition: Secondary | ICD-10-CM

## 2019-04-29 DIAGNOSIS — N401 Enlarged prostate with lower urinary tract symptoms: Secondary | ICD-10-CM

## 2019-04-29 MED ORDER — PAROXETINE HCL 20 MG PO TABS: 20.0000 mg | ORAL_TABLET | Freq: Every day | ORAL | 4 refills | Status: DC

## 2019-04-29 MED FILL — PARoxetine HCL 20 MG TABS: 20 | 30 days supply | Qty: 30 | Fill #0

## 2019-04-29 NOTE — Progress Notes (Signed)
a 

## 2019-04-29 NOTE — Congregational Nurse Program (Signed)
Jeremiah Hunter was seen at [facility] clinic for c/o pruritus w/o visible rash. Awaiting for urology consult. Client states he will pick-up his medication at [facility].

## 2019-04-29 NOTE — Progress Notes (Signed)
paxil Rx in place of citaopram for itching [place]

## 2019-04-30 NOTE — Progress Notes (Signed)
Patient ID: Jeremiah Hunter, male   DOB: [birth year], 59 y.o.   MRN: [id number] This patient is seen today in the [place] [facility] and still complains of pruritus that is chronic he had this even when he was in prison starting in 2018 timeframe.  He denies any hives or any particular rash.  He also is requesting a urology referral for prostatism  On exam blood pressure 130/76 pulse is 76 saturation 95% room air  On exam the skin is not dry there are no rashes the pruritus it tends to be generalized.  The patient does have a prolonged psychiatric history.  In reviewing this patient's case he did not respond to antihistamines and he has chronic pruritus which may be related to a psychogenic condition.  SSRIs are known to be effective for pruritus in this population  I am going to discontinue the citalopram and begin Paxil 10 mg daily to see if this will continue to improve his pruritus and discontinue hydroxyzine  Also make referral to urology

## 2019-05-02 ENCOUNTER — Emergency Department (HOSPITAL_COMMUNITY)
Admission: EM | Admit: 2019-05-02 | Discharge: 2019-05-02 | Disposition: A | Discharge status: Home / Self Care | Payer: Medicaid Other | Attending: Emergency Medicine | Admitting: Emergency Medicine

## 2019-05-02 ENCOUNTER — Encounter (HOSPITAL_COMMUNITY): Payer: Self-pay | Admitting: Emergency Medicine

## 2019-05-02 DIAGNOSIS — Z79899 Other long term (current) drug therapy: Secondary | ICD-10-CM | POA: Insufficient documentation

## 2019-05-02 DIAGNOSIS — N401 Enlarged prostate with lower urinary tract symptoms: Secondary | ICD-10-CM | POA: Diagnosis not present

## 2019-05-02 DIAGNOSIS — R3915 Urgency of urination: Secondary | ICD-10-CM | POA: Diagnosis present

## 2019-05-02 DIAGNOSIS — I10 Essential (primary) hypertension: Secondary | ICD-10-CM | POA: Insufficient documentation

## 2019-05-02 DIAGNOSIS — R330 Drug induced retention of urine: Secondary | ICD-10-CM | POA: Diagnosis not present

## 2019-05-02 LAB — CBC WITH DIFFERENTIAL/PLATELET
Abs Immature Granulocytes: 0 10*3/uL (ref 0.00–0.07)
Basophils Absolute: 0 10*3/uL (ref 0.0–0.1)
Basophils Relative: 0 %
Eosinophils Absolute: 0.1 10*3/uL (ref 0.0–0.5)
Eosinophils Relative: 2 %
HCT: 40.1 % (ref 39.0–52.0)
Hemoglobin: 13.5 g/dL (ref 13.0–17.0)
Immature Granulocytes: 0 %
Lymphocytes Relative: 48 %
Lymphs Abs: 2.2 10*3/uL (ref 0.7–4.0)
MCH: 29.1 pg (ref 26.0–34.0)
MCHC: 33.7 g/dL (ref 30.0–36.0)
MCV: 86.4 fL (ref 80.0–100.0)
Monocytes Absolute: 0.4 10*3/uL (ref 0.1–1.0)
Monocytes Relative: 7 %
Neutro Abs: 2 10*3/uL (ref 1.7–7.7)
Neutrophils Relative %: 43 %
Platelets: 214 10*3/uL (ref 150–400)
RBC: 4.64 MIL/uL (ref 4.22–5.81)
RDW: 14.3 % (ref 11.5–15.5)
WBC: 4.7 10*3/uL (ref 4.0–10.5)
nRBC: 0 % (ref 0.0–0.2)

## 2019-05-02 LAB — URINALYSIS, ROUTINE W REFLEX MICROSCOPIC
Bilirubin Urine: NEGATIVE
Glucose, UA: NEGATIVE mg/dL
Hgb urine dipstick: NEGATIVE
Ketones, ur: NEGATIVE mg/dL
Leukocytes,Ua: NEGATIVE
Nitrite: NEGATIVE
Protein, ur: NEGATIVE mg/dL
Specific Gravity, Urine: 1.016 (ref 1.005–1.030)
pH: 5 (ref 5.0–8.0)

## 2019-05-02 LAB — BASIC METABOLIC PANEL
Anion gap: 9 (ref 5–15)
BUN: 16 mg/dL (ref 6–20)
CO2: 25 mmol/L (ref 22–32)
Calcium: 9 mg/dL (ref 8.9–10.3)
Chloride: 104 mmol/L (ref 98–111)
Creatinine, Ser: 1.2 mg/dL (ref 0.61–1.24)
GFR calc Af Amer: >60 mL/min (ref >60)
GFR calc non Af Amer: >60 mL/min (ref >60)
Glucose, Bld: 92 mg/dL (ref 70–99)
Potassium: 3.8 mmol/L (ref 3.5–5.1)
Sodium: 138 mmol/L (ref 135–145)

## 2019-05-02 NOTE — ED Provider Notes (Signed)
[facility] EMERGENCY DEPARTMENT Provider Note   CSN: [id number] Arrival date & time: 05/02/19  1131     History Chief Complaint  Patient presents with  . Dysuria  . Urinary Retention    Jeremiah Hunter is a 59 y.o. male.  Patient is a 59 year old male with past medical history of BPH, hypertension presenting to the emergency department for difficulty with urination.  Patient reports that he has been taking Flomax since last year and was told by his primary care doctor he has an enlarged prostate.  He is awaiting referral to urology at this time.  Reports that he feels like last night it became more difficult to pass urine.  He reports that every time he urinates it is a small amount and a small stream.  Reports that he always feels like he has not completely emptied his bladder after he urinates.  He denies any hematuria, abdominal pain, flank pain, fever, pain with urination.        Past Medical History:  Diagnosis Date  . Enlarged prostate   . Hypertension     Patient Active Problem List   Diagnosis Date Noted  . Benign prostatic hyperplasia with urinary frequency 03/24/2019  . Hypersomnia 03/24/2019  . Essential hypertension 03/24/2019  . Severe episode of recurrent major depressive disorder, with psychotic features (HCC) 03/24/2019  . Anxiety and depression 03/24/2019  . Plantar fasciitis 03/24/2019  . Lower extremity edema 03/24/2019  . Seizure disorder (HCC) 03/24/2019    Past Surgical History:  Procedure Laterality Date  . CYSTECTOMY    . HERNIA REPAIR         Family History  Family history unknown: Yes    Social History   Tobacco Use  . Smoking status: Never Smoker  . Smokeless tobacco: Never Used  Substance Use Topics  . Alcohol use: Never  . Drug use: Never    Home Medications Prior to Admission medications   Medication Sig Start Date End Date Taking? Authorizing Provider  divalproex (DEPAKOTE) 500 MG DR tablet Take 1  tablet (500 mg total) by mouth 2 (two) times daily. 11/09/18   Starkes-Perry, Juel Burrow, FNP  gabapentin (NEURONTIN) 300 MG capsule Take 1 capsule (300 mg total) by mouth 3 (three) times daily. 04/16/19   Lenn Sink, DPM  hydrocortisone (ANUSOL-HC) 2.5 % rectal cream Place 1 application rectally 2 (two) times daily. 04/02/19   Storm Frisk, MD  hydrOXYzine (ATARAX/VISTARIL) 25 MG tablet Take 1 tablet (25 mg total) by mouth 3 (three) times daily as needed. 04/02/19   Storm Frisk, MD  ibuprofen (ADVIL) 600 MG tablet Take 1 tablet (600 mg total) by mouth every 8 (eight) hours as needed. 04/06/19   Storm Frisk, MD  Metoprolol-Hydrochlorothiazide 25-12.5 MG TB24 Take 1 tablet by mouth daily. 04/21/19   Storm Frisk, MD  OLANZapine (ZYPREXA) 15 MG tablet Take 1 tablet (15 mg total) by mouth at bedtime. 03/24/19   Storm Frisk, MD  PARoxetine (PAXIL) 20 MG tablet Take 1 tablet (20 mg total) by mouth daily. 04/29/19   Storm Frisk, MD  tamsulosin (FLOMAX) 0.4 MG CAPS capsule Take 1 capsule (0.4 mg total) by mouth daily. 03/24/19   Storm Frisk, MD  amLODipine (NORVASC) 10 MG tablet Take 1 tablet (10 mg total) by mouth daily. To lower blood pressure 03/05/19 03/10/19  Storm Frisk, MD    Allergies    Losartan  Review of Systems   Review of  Systems  Constitutional: Negative for appetite change, chills and fever.  Gastrointestinal: Negative for abdominal pain, nausea and vomiting.  Genitourinary: Positive for difficulty urinating, testicular pain and urgency. Negative for decreased urine volume, discharge, dysuria, flank pain, frequency, genital sores, hematuria, penile pain, penile swelling and scrotal swelling.  Skin: Negative for rash and wound.  Allergic/Immunologic: Negative for immunocompromised state.    Physical Exam Updated Vital Signs BP 110/76   Pulse (!) 46   Temp 98.9 F (37.2 C) (Oral)   Resp 16   Ht 5\' 11"  (1.803 m)   Wt 108.9 kg   SpO2 95%   BMI  33.47 kg/m   Physical Exam Vitals and nursing note reviewed.  Constitutional:      Appearance: Normal appearance.  HENT:     Head: Normocephalic.     Mouth/Throat:     Mouth: Mucous membranes are moist.  Eyes:     Conjunctiva/sclera: Conjunctivae normal.  Cardiovascular:     Rate and Rhythm: Normal rate and regular rhythm.  Pulmonary:     Effort: Pulmonary effort is normal.     Breath sounds: Normal breath sounds.  Abdominal:     General: Abdomen is flat. Bowel sounds are normal. There is no distension.     Tenderness: There is no abdominal tenderness. There is no right CVA tenderness, left CVA tenderness or guarding.  Skin:    General: Skin is dry.  Neurological:     Mental Status: He is alert.  Psychiatric:        Mood and Affect: Mood normal.     ED Results / Procedures / Treatments   Labs (all labs ordered are listed, but only abnormal results are displayed) Labs Reviewed  URINE CULTURE  URINALYSIS, ROUTINE W REFLEX MICROSCOPIC  BASIC METABOLIC PANEL  CBC WITH DIFFERENTIAL/PLATELET    EKG None  Radiology No results found.  Procedures Procedures (including critical care time)  Medications Ordered in ED Medications - No data to display  ED Course  I have reviewed the triage vital signs and the nursing notes.  Pertinent labs & imaging results that were available during my care of the patient were reviewed by me and considered in my medical decision making (see chart for details).  Clinical Course as of May 02 1439  Sat May 02, 2019  1436 Patient with urgency and feeling of difficulty urinating worsening over the last several months. Has been referred to urology by PMD. Well appearing on exam with normal labs and urinalysis.    [KM]    Clinical Course User Index [KM] May 04, 2019   MDM Rules/Calculators/A&P                      Based on review of vitals, medical screening exam, lab work and/or imaging, there does not appear to be an acute,  emergent etiology for the patient's symptoms. Counseled pt on good return precautions and encouraged both PCP and ED follow-up as needed.  Prior to discharge, I also discussed incidental imaging findings with patient in detail and advised appropriate, recommended follow-up in detail.  Clinical Impression: 1. Urinary urgency     Disposition: Discharge  Prior to providing a prescription for a controlled substance, I independently reviewed the patient's recent prescription history on the [place] Controlled Substance Reporting System. The patient had no recent or regular prescriptions and was deemed appropriate for a brief, less than 3 day prescription of narcotic for acute analgesia.  This note was  prepared with assistance of Systems analyst. Occasional wrong-word or sound-a-like substitutions may have occurred due to the inherent limitations of voice recognition software.  Final Clinical Impression(s) / ED Diagnoses Final diagnoses:  Urinary urgency    Rx / DC Orders ED Discharge Orders    None       Kristine Royal 05/02/19 1441    Lacretia Leigh, MD 05/03/19 1717

## 2019-05-02 NOTE — ED Notes (Signed)
Bladder scan showing no retention. Pt urinated about 30 mins ago and states he still feels he needs to go but pressure is "at the tip". No pain when pressing on bladder.

## 2019-05-02 NOTE — Discharge Instructions (Signed)
Please be sure to follow-up with urology referral from your primary care doctor. Thank you for allowing me to care for you today. Please return to the emergency department if you have new or worsening symptoms.

## 2019-05-02 NOTE — ED Triage Notes (Signed)
Pt. Stated, Jeremiah Hunter had urinary problems cause I have an enlarge prostate.

## 2019-05-03 LAB — URINE CULTURE: Culture: NO GROWTH

## 2019-05-13 ENCOUNTER — Encounter: Payer: Self-pay | Admitting: Critical Care Medicine

## 2019-05-13 ENCOUNTER — Other Ambulatory Visit: Payer: Self-pay | Admitting: Critical Care Medicine

## 2019-05-13 DIAGNOSIS — L299 Pruritus, unspecified: Secondary | ICD-10-CM

## 2019-05-13 NOTE — Congregational Nurse Program (Signed)
Jeremiah Hunter seen at [facility] for continued c/o itching without rash evident and difficulty voiding. Dr. Delford Field will make referral to Derm. CN will f/u with urology appointment date and time.

## 2019-05-13 NOTE — Progress Notes (Signed)
Patient ID: Callahan Wild, male   DOB: 10/04/1960, 59 y.o.   MRN: [id number] This a 59 year old male seen frequently in the [place] [facility] comes in today with continued pruritus and dryness in the skin also complains of heartburn and loose stools  On exam temp 97 blood pressure 132/70 saturation 94% room air pulse 68 chest was clear abdomen showed tenderness in the epigastric area skin showed dryness generally  Impression is that of pruritus unclear etiology dryness of the skin is exacerbating  Plan is to maintain Paxil 10 mg daily begin omeprazole 20 mg daily for 14 days give the Eucerin cream for moisturization and referral to dermatology will be made

## 2019-05-13 NOTE — Progress Notes (Signed)
See note

## 2019-05-14 ENCOUNTER — Other Ambulatory Visit: Payer: Self-pay

## 2019-05-14 ENCOUNTER — Emergency Department (HOSPITAL_COMMUNITY)
Admission: EM | Admit: 2019-05-14 | Discharge: 2019-05-15 | Disposition: A | Discharge status: Home / Self Care | Payer: Medicaid Other | Attending: Emergency Medicine | Admitting: Emergency Medicine

## 2019-05-14 ENCOUNTER — Encounter (HOSPITAL_COMMUNITY): Payer: Self-pay | Admitting: Emergency Medicine

## 2019-05-14 ENCOUNTER — Telehealth: Payer: Self-pay | Admitting: *Deleted

## 2019-05-14 DIAGNOSIS — Z79899 Other long term (current) drug therapy: Secondary | ICD-10-CM | POA: Diagnosis not present

## 2019-05-14 DIAGNOSIS — I10 Essential (primary) hypertension: Secondary | ICD-10-CM | POA: Diagnosis not present

## 2019-05-14 DIAGNOSIS — R519 Headache, unspecified: Secondary | ICD-10-CM | POA: Diagnosis present

## 2019-05-14 DIAGNOSIS — G44209 Tension-type headache, unspecified, not intractable: Secondary | ICD-10-CM | POA: Diagnosis not present

## 2019-05-14 NOTE — Telephone Encounter (Signed)
He will need to go thru his family physician as I only saw him one time

## 2019-05-14 NOTE — Telephone Encounter (Signed)
Pt states the medication Dr. Charlsie Merles ordered is not helping, does he need to continue or make an appt.

## 2019-05-14 NOTE — Telephone Encounter (Signed)
Pt called states his pain is worsening and I told pt I had sent a message to Dr. Charlsie Merles for orders to assist with pain management and I would like him to make an appt to get in to be evaluated.

## 2019-05-14 NOTE — ED Triage Notes (Signed)
Pt to ED with c/o headache on left side of head onset earlier today.  Pt st's he doesn't ever have a headache.  Has not taken any meds for same.

## 2019-05-14 NOTE — Telephone Encounter (Signed)
I spoke with pt and asked how many Gabapentin he was taking a day. Pt states he is taking 3 gabapentin a day.

## 2019-05-15 ENCOUNTER — Emergency Department (HOSPITAL_COMMUNITY): Payer: Medicaid Other

## 2019-05-15 NOTE — Discharge Instructions (Signed)
You may use over-the-counter Motrin (Ibuprofen), Acetaminophen (Tylenol), topical muscle creams such as SalonPas, Icy Hot, Bengay, etc. Please stretch, apply heat, and have massage therapy for additional assistance. ° °

## 2019-05-15 NOTE — ED Notes (Signed)
Pt ambulated to room with this nurse. Denies HA currently.

## 2019-05-15 NOTE — ED Provider Notes (Signed)
[facility] EMERGENCY DEPARTMENT Provider Note  CSN: [id number] Arrival date & time: 05/14/19 1833  Chief Complaint(s) Headache  HPI Jeremiah Hunter is a 59 y.o. male with a past medical history listed below who presents to the emergency department with new onset left-sided headache that began earlier this morning.  Patient reports that he noticed it while waking.  Pain has been constant to the left side of the head.  No alleviating or aggravating factors.  Patient has associated left upper back discomfort.  No fevers or chills.  No visual disturbance.  No focal deficits.  No trauma.    HPI  Past Medical History Past Medical History:  Diagnosis Date  . Enlarged prostate   . Hypertension    Patient Active Problem List   Diagnosis Date Noted  . Benign prostatic hyperplasia with urinary frequency 03/24/2019  . Hypersomnia 03/24/2019  . Essential hypertension 03/24/2019  . Severe episode of recurrent major depressive disorder, with psychotic features (HCC) 03/24/2019  . Anxiety and depression 03/24/2019  . Plantar fasciitis 03/24/2019  . Lower extremity edema 03/24/2019  . Seizure disorder (HCC) 03/24/2019   Home Medication(s) Prior to Admission medications   Medication Sig Start Date End Date Taking? Authorizing Provider  divalproex (DEPAKOTE) 500 MG DR tablet Take 1 tablet (500 mg total) by mouth 2 (two) times daily. 11/09/18   Starkes-Perry, Juel Burrow, FNP  gabapentin (NEURONTIN) 300 MG capsule Take 1 capsule (300 mg total) by mouth 3 (three) times daily. 04/16/19   Lenn Sink, DPM  hydrocortisone (ANUSOL-HC) 2.5 % rectal cream Place 1 application rectally 2 (two) times daily. 04/02/19   Storm Frisk, MD  ibuprofen (ADVIL) 600 MG tablet Take 1 tablet (600 mg total) by mouth every 8 (eight) hours as needed. 04/06/19   Storm Frisk, MD  Metoprolol-Hydrochlorothiazide 25-12.5 MG TB24 Take 1 tablet by mouth daily. 04/21/19   Storm Frisk, MD  OLANZapine  (ZYPREXA) 15 MG tablet Take 1 tablet (15 mg total) by mouth at bedtime. 03/24/19   Storm Frisk, MD  PARoxetine (PAXIL) 20 MG tablet Take 1 tablet (20 mg total) by mouth daily. 04/29/19   Storm Frisk, MD  tamsulosin (FLOMAX) 0.4 MG CAPS capsule Take 1 capsule (0.4 mg total) by mouth daily. 03/24/19   Storm Frisk, MD  amLODipine (NORVASC) 10 MG tablet Take 1 tablet (10 mg total) by mouth daily. To lower blood pressure 03/05/19 03/10/19  Storm Frisk, MD                                                                                                                                    Past Surgical History Past Surgical History:  Procedure Laterality Date  . CYSTECTOMY    . HERNIA REPAIR     Family History Family History  Family history unknown: Yes    Social History Social History   Tobacco Use  .  Smoking status: Never Smoker  . Smokeless tobacco: Never Used  Substance Use Topics  . Alcohol use: Never  . Drug use: Never   Allergies Losartan  Review of Systems Review of Systems All other systems are reviewed and are negative for acute change except as noted in the HPI  Physical Exam Vital Signs  I have reviewed the triage vital signs BP (!) 126/94 (BP Location: Right Arm)   Pulse 63   Temp 97.6 F (36.4 C) (Oral)   Resp 18   Ht 5\' 11"  (1.803 m)   Wt 111.1 kg   SpO2 100%   BMI 34.17 kg/m   Physical Exam Vitals reviewed.  Constitutional:      General: He is not in acute distress.    Appearance: He is well-developed. He is not diaphoretic.  HENT:     Head: Normocephalic and atraumatic.     Jaw: No trismus or tenderness.     Comments: No temporal tenderness    Right Ear: External ear normal.     Left Ear: External ear normal.     Nose: Nose normal.  Eyes:     General: No scleral icterus.    Conjunctiva/sclera: Conjunctivae normal.  Neck:     Trachea: Phonation normal.  Cardiovascular:     Rate and Rhythm: Normal rate and regular rhythm.    Pulmonary:     Effort: Pulmonary effort is normal. No respiratory distress.     Breath sounds: No stridor.  Abdominal:     General: There is no distension.  Musculoskeletal:        General: Normal range of motion.     Cervical back: Normal range of motion.  Neurological:     Mental Status: He is alert and oriented to person, place, and time.     Cranial Nerves: Cranial nerves are intact.     Sensory: Sensation is intact.     Motor: Motor function is intact.     Coordination: Coordination is intact.  Psychiatric:        Behavior: Behavior normal.     ED Results and Treatments Labs (all labs ordered are listed, but only abnormal results are displayed) Labs Reviewed - No data to display                                                                                                                       EKG  EKG Interpretation  Date/Time:    Ventricular Rate:    PR Interval:    QRS Duration:   QT Interval:    QTC Calculation:   R Axis:     Text Interpretation:        Radiology CT Head Wo Contrast  Result Date: 05/15/2019 CLINICAL DATA:  Headache EXAM: CT HEAD WITHOUT CONTRAST TECHNIQUE: Contiguous axial images were obtained from the base of the skull through the vertex without intravenous contrast. COMPARISON:  11/13/2018 FINDINGS: Brain: No acute intracranial abnormality. Specifically, no hemorrhage, hydrocephalus, mass lesion, acute infarction, or  significant intracranial injury. Vascular: No hyperdense vessel or unexpected calcification. Skull: No acute calvarial abnormality. Sinuses/Orbits: Old left medial orbital wall blowout fracture. Paranasal sinuses clear. Other: None IMPRESSION: No intracranial abnormality. Electronically Signed   By: Charlett Nose M.D.   On: 05/15/2019 00:16    Pertinent labs & imaging results that were available during my care of the patient were reviewed by me and considered in my medical decision making (see chart for details).  Medications  Ordered in ED Medications - No data to display                                                                                                                                  Procedures Procedures  (including critical care time)  Medical Decision Making / ED Course I have reviewed the nursing notes for this encounter and the patient's prior records (if available in EHR or on provided paperwork).   Jeremiah Hunter was evaluated in Emergency Department on 05/15/2019 for the symptoms described in the history of present illness. He was evaluated in the context of the global COVID-19 pandemic, which necessitated consideration that the patient might be at risk for infection with the SARS-CoV-2 virus that causes COVID-19. Institutional protocols and algorithms that pertain to the evaluation of patients at risk for COVID-19 are in a state of rapid change based on information released by regulatory bodies including the CDC and federal and state organizations. These policies and algorithms were followed during the patient's care in the ED.  Patient with new left-sided headache.  Exam nonfocal.  CT head negative.  Pain resolved after patient took a quick nap.  No evidence suggesting temporal arteritis.  Patient has associated left upper back/shoulder girdle muscle strain which is likely contributing to the patient's headache.  Recommended warm compresses and stretching.        Final Clinical Impression(s) / ED Diagnoses Final diagnoses:  Muscle tension headache    The patient appears reasonably screened and/or stabilized for discharge and I doubt any other medical condition or other [facility] requiring further screening, evaluation, or treatment in the ED at this time prior to discharge. Safe for discharge with strict return precautions.  Disposition: Discharge  Condition: Good  I have discussed the results, Dx and Tx plan with the patient/family who expressed understanding and agree(s) with the plan.  Discharge instructions discussed at length. The patient/family was given strict return precautions who verbalized understanding of the instructions. No further questions at time of discharge.    ED Discharge Orders    None      Follow Up: Storm Frisk, MD [address] [place] [address] [phone]  Schedule an appointment as soon as possible for a visit  As needed     This chart was dictated using voice recognition software.  Despite best efforts to proofread,  errors can occur which can change the documentation meaning.   Ilissa Rosner, Amadeo Garnet,  MD 05/15/19 9292

## 2019-05-21 ENCOUNTER — Encounter: Payer: Self-pay | Admitting: Critical Care Medicine

## 2019-05-21 ENCOUNTER — Other Ambulatory Visit: Payer: Self-pay | Admitting: Critical Care Medicine

## 2019-05-21 MED ORDER — OMEPRAZOLE 20 MG PO CPDR: 20.0000 mg | DELAYED_RELEASE_CAPSULE | Freq: Every day | ORAL | 4 refills | Status: DC

## 2019-05-21 NOTE — Congregational Nurse Program (Signed)
Jeremiah Hunter seen at [facility] clinic for c/o increased leg cramps. Client will keep Derm appointment at [facility] 4/30 and rescheduled urology appointment 5/12. States no transportation assistance

## 2019-05-21 NOTE — Progress Notes (Signed)
Refill meds

## 2019-05-22 NOTE — Progress Notes (Signed)
Patient ID: Jeremiah Hunter, male   DOB: [birth year], 59 y.o.   MRN: [id number] This patient is seen in the [place] [facility] clinic and wants to follow-up on scheduling of various specialty appointments.  He has ongoing prostatism and has an appointment on May 12 with urology.  He continues to have severe pruritus and has an appointment on the 30th with dermatology.  He now has housing approval [place] and is waiting for this to be processed.  He continues to have leg cramps.  On exam blood pressure 130/80 pulse 73 saturation 97% room air  There are no new findings on the patient's physical exam  We will continue to monitor the cramps for now he will follow-up with his specialty appointments

## 2019-05-27 ENCOUNTER — Encounter: Payer: Self-pay | Admitting: Podiatry

## 2019-05-27 ENCOUNTER — Other Ambulatory Visit: Payer: Self-pay | Admitting: Critical Care Medicine

## 2019-05-27 ENCOUNTER — Encounter: Payer: Self-pay | Admitting: Critical Care Medicine

## 2019-05-27 ENCOUNTER — Ambulatory Visit: Payer: Medicaid Other | Admitting: Podiatry

## 2019-05-27 ENCOUNTER — Other Ambulatory Visit: Payer: Self-pay

## 2019-05-27 VITALS — Temp 97.3°F

## 2019-05-27 DIAGNOSIS — G4710 Hypersomnia, unspecified: Secondary | ICD-10-CM

## 2019-05-27 DIAGNOSIS — F333 Major depressive disorder, recurrent, severe with psychotic symptoms: Secondary | ICD-10-CM

## 2019-05-27 DIAGNOSIS — M779 Enthesopathy, unspecified: Secondary | ICD-10-CM

## 2019-05-27 DIAGNOSIS — M7752 Other enthesopathy of left foot: Secondary | ICD-10-CM

## 2019-05-27 DIAGNOSIS — G629 Polyneuropathy, unspecified: Secondary | ICD-10-CM

## 2019-05-27 NOTE — Progress Notes (Signed)
Subjective:   Patient ID: Jeremiah Hunter, male   DOB: 59 y.o.   MRN: [id number]   HPI patient presents stating she is still getting a burning shooting pain in both feet and also states the big toe joint left has become inflamed and is sore when he walks at nighttime.  States the medicine has been tolerable and he has increased the dose but he still does have some pain   ROS      Objective:  Physical Exam  Patient is found to have diminishment of sharp dull vibratory bilateral and is found to have moderate symptoms of neuropathic disease.  He has inflammation pain around the first MPJ left with fluid buildup and narrowness of the joint surface     Assessment:  Inflammatory capsulitis first MPJ left with patient also noted to have neuro neuropathic symptoms that he continues to work on     Plan:  H&P condition reviewed and at this point I have recommended gradual increase in gabapentin as tolerated.  We discussed neuropathy and that there is no good long-term treatment but hopefully this will help to contain and I did go ahead and I did sterile prep and injected the left first MPJ 3 mg Kenalog 5 mg Xylocaine and applied sterile dressing

## 2019-05-28 NOTE — Progress Notes (Signed)
Patient ID: Jeremiah Hunter, male   DOB: 1961-01-12, 59 y.o.   MRN: [id number] This is a 59 year old male previous history of mental health condition and sleep apnea benign hypertrophy of prostate and hypertension who comes to the [place] shelter clinic in follow-up.  He just saw the podiatrist and underwent a toe injection of the right great toe this is helped his symptoms considerably.  He is on the gabapentin as well.  He is requesting when his sleep study be done it is still pending.  He needs an actual clinic appointment as well.  On exam blood pressure 122/77 saturation 96% room air pulse is 76 he is now on doxepin note for his itching and is to come off the Zyprexa for now  Plan is to continue to follow this patient expectantly and we will check into the sleep study to get him an appointment in the health and wellness clinic

## 2019-06-05 NOTE — Congregational Nurse Program (Signed)
Mr.Rogrs reminded of urology appointment on 5/19.

## 2019-06-08 ENCOUNTER — Other Ambulatory Visit (HOSPITAL_COMMUNITY)
Admission: RE | Admit: 2019-06-08 | Discharge: 2019-06-08 | Disposition: A | Discharge status: Home / Self Care | Payer: Medicaid Other | Source: Ambulatory Visit | Attending: Internal Medicine | Admitting: Internal Medicine

## 2019-06-08 DIAGNOSIS — Z20822 Contact with and (suspected) exposure to covid-19: Secondary | ICD-10-CM | POA: Diagnosis not present

## 2019-06-08 DIAGNOSIS — Z01812 Encounter for preprocedural laboratory examination: Secondary | ICD-10-CM | POA: Diagnosis not present

## 2019-06-08 LAB — SARS CORONAVIRUS 2 (TAT 6-24 HRS): SARS Coronavirus 2: NEGATIVE

## 2019-06-09 ENCOUNTER — Telehealth: Payer: Self-pay | Admitting: Podiatry

## 2019-06-09 MED ORDER — GABAPENTIN 300 MG PO CAPS: 300.0000 mg | ORAL_CAPSULE | Freq: Three times a day (TID) | ORAL | 3 refills | Status: DC

## 2019-06-09 NOTE — Telephone Encounter (Signed)
Pt says he needs a medication refill from Dr. Charlsie Merles. He didn't seem sure of what he really needed. Please advise

## 2019-06-09 NOTE — Addendum Note (Signed)
Addended by: Alphia Kava D on: 06/09/2019 12:47 PM   Modules accepted: Orders

## 2019-06-09 NOTE — Telephone Encounter (Signed)
I informed pt Dr. Charlsie Merles would want to see him in 2-3 months and I refilled the Gabapentin as previously and informed pt It was sent to [facility] on [address]. Pt states understanding and will call later for an appt.

## 2019-06-10 ENCOUNTER — Encounter: Payer: Self-pay | Admitting: Critical Care Medicine

## 2019-06-10 ENCOUNTER — Other Ambulatory Visit: Payer: Self-pay

## 2019-06-10 ENCOUNTER — Ambulatory Visit (HOSPITAL_BASED_OUTPATIENT_CLINIC_OR_DEPARTMENT_OTHER): Payer: Medicaid Other | Attending: Critical Care Medicine | Admitting: Internal Medicine

## 2019-06-10 ENCOUNTER — Telehealth: Payer: Self-pay | Admitting: *Deleted

## 2019-06-10 ENCOUNTER — Other Ambulatory Visit: Payer: Self-pay | Admitting: Critical Care Medicine

## 2019-06-10 DIAGNOSIS — G4733 Obstructive sleep apnea (adult) (pediatric): Secondary | ICD-10-CM | POA: Diagnosis not present

## 2019-06-10 DIAGNOSIS — F333 Major depressive disorder, recurrent, severe with psychotic symptoms: Secondary | ICD-10-CM | POA: Diagnosis present

## 2019-06-10 DIAGNOSIS — G4710 Hypersomnia, unspecified: Secondary | ICD-10-CM | POA: Insufficient documentation

## 2019-06-10 MED ORDER — METOPROLOL-HCTZ ER 25-12.5 MG PO TB24: 1.0000 | ORAL_TABLET | Freq: Every day | ORAL | 3 refills | Status: DC

## 2019-06-10 MED ORDER — GABAPENTIN 300 MG PO CAPS: 600.0000 mg | ORAL_CAPSULE | Freq: Three times a day (TID) | ORAL | 1 refills | Status: DC

## 2019-06-10 MED FILL — GABAPENTIN 300 MG CAPSULE: 300 | 30 days supply | Qty: 180 | Fill #0

## 2019-06-10 NOTE — Progress Notes (Signed)
Patient ID: Jeremiah Hunter, male   DOB: Jun 22, 1960, 59 y.o.   MRN: [id number] This patient is seen in the [facility] clinic and wished to have his medications clarified.  He saw dermatology who prescribed doxepin and wished him to go off olanzapine.  He also needs refills on his gabapentin.  The patient had his dose is clarified of his doxepin he is to take 2 tablets daily and this is resulted in marked reduction in his pruritus.  He will have his gabapentin refilled and we will see him back

## 2019-06-10 NOTE — Telephone Encounter (Signed)
Patient called and stated that the gabapentin has been helping since he has started doing 3 times a day and taken 2 pills a day and just wanted [place] to know that and relayed the message to Dr Charlsie Merles. Misty Stanley

## 2019-06-10 NOTE — Progress Notes (Signed)
Med refil

## 2019-06-11 ENCOUNTER — Encounter: Payer: Self-pay | Admitting: Critical Care Medicine

## 2019-06-11 ENCOUNTER — Ambulatory Visit: Payer: Medicaid Other | Attending: Critical Care Medicine | Admitting: Critical Care Medicine

## 2019-06-11 VITALS — BP 112/76 | HR 62 | Temp 98.0°F | Resp 16 | Ht 71.0 in | Wt 240.0 lb

## 2019-06-11 DIAGNOSIS — R531 Weakness: Secondary | ICD-10-CM | POA: Diagnosis not present

## 2019-06-11 DIAGNOSIS — F419 Anxiety disorder, unspecified: Secondary | ICD-10-CM

## 2019-06-11 DIAGNOSIS — G40909 Epilepsy, unspecified, not intractable, without status epilepticus: Secondary | ICD-10-CM | POA: Diagnosis not present

## 2019-06-11 DIAGNOSIS — F4310 Post-traumatic stress disorder, unspecified: Secondary | ICD-10-CM | POA: Diagnosis not present

## 2019-06-11 DIAGNOSIS — N3281 Overactive bladder: Secondary | ICD-10-CM | POA: Insufficient documentation

## 2019-06-11 DIAGNOSIS — N401 Enlarged prostate with lower urinary tract symptoms: Secondary | ICD-10-CM | POA: Insufficient documentation

## 2019-06-11 DIAGNOSIS — F329 Major depressive disorder, single episode, unspecified: Secondary | ICD-10-CM

## 2019-06-11 DIAGNOSIS — G4710 Hypersomnia, unspecified: Secondary | ICD-10-CM | POA: Diagnosis not present

## 2019-06-11 DIAGNOSIS — F333 Major depressive disorder, recurrent, severe with psychotic symptoms: Secondary | ICD-10-CM | POA: Diagnosis not present

## 2019-06-11 DIAGNOSIS — I10 Essential (primary) hypertension: Secondary | ICD-10-CM | POA: Diagnosis present

## 2019-06-11 DIAGNOSIS — Z79899 Other long term (current) drug therapy: Secondary | ICD-10-CM | POA: Insufficient documentation

## 2019-06-11 DIAGNOSIS — R0600 Dyspnea, unspecified: Secondary | ICD-10-CM | POA: Insufficient documentation

## 2019-06-11 DIAGNOSIS — F32A Depression, unspecified: Secondary | ICD-10-CM

## 2019-06-11 DIAGNOSIS — M722 Plantar fascial fibromatosis: Secondary | ICD-10-CM

## 2019-06-11 MED ORDER — METOPROLOL TARTRATE 25 MG PO TABS: 25.0000 mg | ORAL_TABLET | Freq: Every day | ORAL | 3 refills | Status: DC

## 2019-06-11 MED ORDER — LORATADINE 10 MG PO TABS: 10.0000 mg | ORAL_TABLET | Freq: Every day | ORAL | 4 refills | Status: DC

## 2019-06-11 MED ORDER — HYDROCHLOROTHIAZIDE 25 MG PO TABS: 25.0000 mg | ORAL_TABLET | Freq: Every day | ORAL | 3 refills | Status: DC

## 2019-06-11 MED ORDER — AMLODIPINE BESYLATE 10 MG PO TABS: 10.0000 mg | ORAL_TABLET | Freq: Every day | ORAL | 5 refills | Status: DC

## 2019-06-11 MED ORDER — METOPROLOL SUCCINATE ER 25 MG PO TB24: 25.0000 mg | ORAL_TABLET | Freq: Every day | ORAL | 3 refills | Status: DC

## 2019-06-11 MED ORDER — METOPROLOL-HCTZ ER 25-12.5 MG PO TB24: 1.0000 | ORAL_TABLET | Freq: Every day | ORAL | 3 refills | Status: DC
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
## 2019-06-11 NOTE — Assessment & Plan Note (Signed)
Essential hypertension under good control with metoprolol 25 mg daily and hydrochlorthiazide 25 mg daily  We will refill both medications separately as the combination medicine is too expensive under his drug plan

## 2019-06-11 NOTE — Assessment & Plan Note (Signed)
Depression under good control at this time  Per mental health provider will continue doxepin at night  Will hold Zyprexa for now

## 2019-06-11 NOTE — Progress Notes (Signed)
Here for a physical exam and clarify his meds

## 2019-06-11 NOTE — Patient Instructions (Signed)
Refill on metoprolol and hydrochlorthiazide sent to our pharmacy Loratidine sent to our pharmacy A letter was made for bathroom use at shelter  Return Dr Delford Field 3 months

## 2019-06-11 NOTE — Assessment & Plan Note (Signed)
Appreciate urology input patient will continue Vesicare 5 mg daily and discontinue Flomax

## 2019-06-11 NOTE — Assessment & Plan Note (Signed)
Plantar fasciitis continue gabapentin 3 times daily 600 mg and ibuprofen as needed

## 2019-06-11 NOTE — Assessment & Plan Note (Signed)
Seizure disorder we will continue Depakote 500 mg twice daily

## 2019-06-11 NOTE — Assessment & Plan Note (Signed)
Follow-up results of sleep study

## 2019-06-13 NOTE — Procedures (Signed)
    Patient Name: Jeremiah Hunter, Jeremiah Hunter Date: 06/10/2019 Gender: Male D.O.B: Jan 12, 1961 Age (years): 59 Referring Provider: Shan Levans Height (inches): 71 Interpreting Physician: Jetty Duhamel MD, ABSM Weight (lbs): 240 RPSGT: Ulyess Mort BMI: 33 MRN: [id number] Neck Size: 19.00  CLINICAL INFORMATION Sleep Study Type: NPSG Indication for sleep study: Daytime Fatigue, Depression, Fatigue, Hypertension, Snoring Epworth Sleepiness Score: 15  SLEEP STUDY TECHNIQUE As per the AASM Manual for the Scoring of Sleep and Associated Events v2.3 (April 2016) with a hypopnea requiring 4% desaturations.  The channels recorded and monitored were frontal, central and occipital EEG, electrooculogram (EOG), submentalis EMG (chin), nasal and oral airflow, thoracic and abdominal wall motion, anterior tibialis EMG, snore microphone, electrocardiogram, and pulse oximetry.  MEDICATIONS Medications self-administered by patient taken the night of the study : DOXEPIN HCL, GABAPENTIN  SLEEP ARCHITECTURE The study was initiated at 9:59:09 PM and ended at 4:29:08 AM.  Sleep onset time was 10.2 minutes and the sleep efficiency was 78.2%%. The total sleep time was 305 minutes.  Stage REM latency was 101.5 minutes.  The patient spent 14.1%% of the night in stage N1 sleep, 73.4%% in stage N2 sleep, 0.0%% in stage N3 and 12.5% in REM.  Alpha intrusion was absent.  Supine sleep was 13.83%.  RESPIRATORY PARAMETERS The overall apnea/hypopnea index (AHI) was 24.2 per hour. There were 27 total apneas, including 23 obstructive, 3 central and 1 mixed apneas. There were 96 hypopneas and 9 RERAs.  The AHI during Stage REM sleep was 71.1 per hour.  AHI while supine was 72.5 per hour.  The mean oxygen saturation was 92.7%. The minimum SpO2 during sleep was 78.0%.  moderate snoring was noted during this study.  CARDIAC DATA The 2 lead EKG demonstrated sinus rhythm. The mean heart rate was 62.6 beats per  minute. Other EKG findings include: None.  LEG MOVEMENT DATA The total PLMS were 0 with a resulting PLMS index of 0.0. Associated arousal with leg movement index was 0.0 .  IMPRESSIONS - Moderate obstructive sleep apnea occurred during this study (AHI = 24.2/h). - No significant central sleep apnea occurred during this study (CAI = 0.6/h). - Moderate oxygen desaturation was noted during this study (Min O2 = 78.0%). Mean sat 92.7%. - The patient snored with moderate snoring volume. - No cardiac abnormalities were noted during this study. - Clinically significant periodic limb movements did not occur during sleep. No significant associated arousals.  DIAGNOSIS - Obstructive Sleep Apnea (327.23 [G47.33 ICD-10])  RECOMMENDATIONS - Suggest CPAP titration sleep study or autopap. Other options woulld be based on clinical judgment. - Be careful with alcohol, sedatives and other CNS depressants that may worsen sleep apnea and disrupt normal sleep architecture. - Sleep hygiene should be reviewed to assess factors that may improve sleep quality. - Weight management and regular exercise should be initiated or continued if appropriate.  [Electronically signed] 06/13/2019 12:18 PM  Jetty Duhamel MD, ABSM Diplomate, American Board of Sleep Medicine   NPI: [id number]                         Jetty Duhamel Diplomate, American Board of Sleep Medicine  ELECTRONICALLY SIGNED ON:  06/13/2019, 12:16 PM [facility] PH: [phone]   FX: [phone] ACCREDITED BY THE AMERICAN ACADEMY OF SLEEP MEDICINE

## 2019-06-15 ENCOUNTER — Telehealth: Payer: Self-pay | Admitting: Critical Care Medicine

## 2019-06-15 DIAGNOSIS — G4733 Obstructive sleep apnea (adult) (pediatric): Secondary | ICD-10-CM

## 2019-06-15 NOTE — Telephone Encounter (Signed)
Sleep study Positive for OSA    Will order CPAP  , has medicaid

## 2019-06-16 ENCOUNTER — Telehealth: Payer: Self-pay

## 2019-06-16 NOTE — Telephone Encounter (Signed)
Call placed to patient and informed him that an order has been received for CPAP. He did not have any preference of DME companies.   Order faxed to [facility]

## 2019-06-24 ENCOUNTER — Encounter: Payer: Self-pay | Admitting: Critical Care Medicine

## 2019-06-24 NOTE — Progress Notes (Signed)
Patient ID: Jeremiah Hunter, male   DOB: 1960-08-10, 59 y.o.   MRN: [id number] This is a 59 year old male seen in the [facility] we followed him long-term.  He states he is not seeing his dermatologist currently is following psychiatry who recommended he stay off doxepin however his itching is recurred off the doxepin.  He also complains of decreased hearing.  He has had to surgery 1 to me to check his teeth for this.  Note he is getting housing and hopes to move out of the shelter with the next month.  On exam blood pressure 134/86 pulse 76 saturation 96% room air oral exam shows status post extraction of the left lower molar.  He has bilateral cerumen impaction on ear exam remainder of exam is unremarkable skin exam is normal  Impression is that a psychogenic itching for which I do recommend he resume the doxepin he will discuss this with psychiatry and will not follow-up with dermatology for now  He will follow-up with his dentist  I asked him to obtain a nurse visit we will schedule this to clean his ears out for the cerumen impaction

## 2019-06-26 NOTE — Congregational Nurse Program (Signed)
Jeremiah Hunter was seen at [facility] for f/u with past c/o of "itching" without evidence of a visible rash. States discomfort has lessened.Will continue to f/u at clinic. Awaiting for apartment placement.

## 2019-06-30 ENCOUNTER — Telehealth: Payer: Self-pay

## 2019-06-30 ENCOUNTER — Telehealth: Payer: Self-pay | Admitting: Critical Care Medicine

## 2019-06-30 NOTE — Telephone Encounter (Signed)
Pt needs appt with me in June and at that time needs to receive Shingles vaccine

## 2019-06-30 NOTE — Telephone Encounter (Signed)
Signed CMN faxed to [facility].

## 2019-07-07 ENCOUNTER — Ambulatory Visit: Payer: Medicaid Other

## 2019-07-10 ENCOUNTER — Other Ambulatory Visit: Payer: Self-pay | Admitting: Critical Care Medicine

## 2019-07-10 MED ORDER — GABAPENTIN 300 MG PO CAPS: 600.0000 mg | ORAL_CAPSULE | Freq: Three times a day (TID) | ORAL | 1 refills | Status: DC

## 2019-07-10 NOTE — Progress Notes (Signed)
Refill on gabapentin °

## 2019-07-13 ENCOUNTER — Other Ambulatory Visit: Payer: Self-pay | Admitting: Critical Care Medicine

## 2019-07-13 MED ORDER — DOXEPIN HCL 50 MG PO CAPS: 50.0000 mg | ORAL_CAPSULE | Freq: Every day | ORAL | 1 refills | Status: DC

## 2019-07-13 MED ORDER — DOXEPIN HCL 25 MG PO CAPS: 25.0000 mg | ORAL_CAPSULE | Freq: Every day | ORAL | 1 refills | Status: DC

## 2019-07-13 NOTE — Progress Notes (Signed)
Refills sent for the patien

## 2019-07-14 ENCOUNTER — Ambulatory Visit: Payer: Medicaid Other

## 2019-07-22 ENCOUNTER — Telehealth: Payer: Self-pay

## 2019-07-22 NOTE — Telephone Encounter (Signed)
Signed CMN for CPAP faxed to [facility]

## 2019-07-24 ENCOUNTER — Ambulatory Visit: Payer: Medicaid Other | Attending: Critical Care Medicine

## 2019-07-24 ENCOUNTER — Other Ambulatory Visit: Payer: Self-pay

## 2019-07-24 DIAGNOSIS — H938X3 Other specified disorders of ear, bilateral: Secondary | ICD-10-CM

## 2019-07-24 NOTE — Progress Notes (Signed)
Patient arrived at clinic to get bil ear irrigation.  Both ears were cleaned and he was informed to keep upcoming appointment.

## 2019-07-28 ENCOUNTER — Telehealth: Payer: Self-pay | Admitting: Critical Care Medicine

## 2019-07-28 NOTE — Telephone Encounter (Signed)
Pt was scheduled to see Dr Delford Field on 07/30/2019

## 2019-07-28 NOTE — Telephone Encounter (Signed)
Pt needs OV , please work him in around 1120am on this Thursday the 8th

## 2019-07-30 ENCOUNTER — Encounter: Payer: Self-pay | Admitting: Critical Care Medicine

## 2019-07-30 ENCOUNTER — Ambulatory Visit: Payer: Medicaid Other | Attending: Critical Care Medicine | Admitting: Critical Care Medicine

## 2019-07-30 ENCOUNTER — Other Ambulatory Visit: Payer: Self-pay

## 2019-07-30 ENCOUNTER — Telehealth: Payer: Self-pay | Admitting: Critical Care Medicine

## 2019-07-30 VITALS — BP 107/72 | HR 84 | Temp 97.6°F | Resp 16 | Ht 71.0 in | Wt 241.0 lb

## 2019-07-30 DIAGNOSIS — R531 Weakness: Secondary | ICD-10-CM | POA: Diagnosis not present

## 2019-07-30 DIAGNOSIS — F419 Anxiety disorder, unspecified: Secondary | ICD-10-CM | POA: Diagnosis not present

## 2019-07-30 DIAGNOSIS — Z79899 Other long term (current) drug therapy: Secondary | ICD-10-CM | POA: Diagnosis not present

## 2019-07-30 DIAGNOSIS — I10 Essential (primary) hypertension: Secondary | ICD-10-CM

## 2019-07-30 DIAGNOSIS — G8929 Other chronic pain: Secondary | ICD-10-CM | POA: Insufficient documentation

## 2019-07-30 DIAGNOSIS — M545 Low back pain, unspecified: Secondary | ICD-10-CM

## 2019-07-30 DIAGNOSIS — G629 Polyneuropathy, unspecified: Secondary | ICD-10-CM | POA: Diagnosis not present

## 2019-07-30 DIAGNOSIS — N3281 Overactive bladder: Secondary | ICD-10-CM | POA: Diagnosis not present

## 2019-07-30 DIAGNOSIS — Z590 Homelessness: Secondary | ICD-10-CM | POA: Insufficient documentation

## 2019-07-30 DIAGNOSIS — F329 Major depressive disorder, single episode, unspecified: Secondary | ICD-10-CM

## 2019-07-30 DIAGNOSIS — G4733 Obstructive sleep apnea (adult) (pediatric): Secondary | ICD-10-CM | POA: Insufficient documentation

## 2019-07-30 DIAGNOSIS — G40909 Epilepsy, unspecified, not intractable, without status epilepticus: Secondary | ICD-10-CM

## 2019-07-30 DIAGNOSIS — G4710 Hypersomnia, unspecified: Secondary | ICD-10-CM | POA: Insufficient documentation

## 2019-07-30 DIAGNOSIS — F333 Major depressive disorder, recurrent, severe with psychotic symptoms: Secondary | ICD-10-CM | POA: Insufficient documentation

## 2019-07-30 DIAGNOSIS — F4310 Post-traumatic stress disorder, unspecified: Secondary | ICD-10-CM | POA: Diagnosis not present

## 2019-07-30 DIAGNOSIS — R0600 Dyspnea, unspecified: Secondary | ICD-10-CM | POA: Diagnosis not present

## 2019-07-30 DIAGNOSIS — Z791 Long term (current) use of non-steroidal anti-inflammatories (NSAID): Secondary | ICD-10-CM | POA: Diagnosis not present

## 2019-07-30 DIAGNOSIS — Z62819 Personal history of unspecified abuse in childhood: Secondary | ICD-10-CM | POA: Insufficient documentation

## 2019-07-30 DIAGNOSIS — Z888 Allergy status to other drugs, medicaments and biological substances status: Secondary | ICD-10-CM | POA: Diagnosis not present

## 2019-07-30 DIAGNOSIS — F32A Depression, unspecified: Secondary | ICD-10-CM

## 2019-07-30 DIAGNOSIS — K644 Residual hemorrhoidal skin tags: Secondary | ICD-10-CM | POA: Insufficient documentation

## 2019-07-30 DIAGNOSIS — Z9989 Dependence on other enabling machines and devices: Secondary | ICD-10-CM

## 2019-07-30 HISTORY — DX: Low back pain, unspecified: M54.50

## 2019-07-30 MED ORDER — DICLOFENAC SODIUM 1 % EX GEL: 2.0000 g | Freq: Four times a day (QID) | CUTANEOUS | 0 refills | Status: DC

## 2019-07-30 MED ORDER — SOLIFENACIN SUCCINATE 5 MG PO TABS: 5.0000 mg | ORAL_TABLET | Freq: Every day | ORAL | 1 refills | Status: DC

## 2019-07-30 MED ORDER — GABAPENTIN 300 MG PO CAPS: 600.0000 mg | ORAL_CAPSULE | Freq: Three times a day (TID) | ORAL | 1 refills | Status: DC

## 2019-07-30 MED ORDER — DOXEPIN HCL 150 MG PO CAPS: 150.0000 mg | ORAL_CAPSULE | Freq: Every day | ORAL | 2 refills | Status: DC

## 2019-07-30 MED ORDER — DIVALPROEX SODIUM 500 MG PO DR TAB: 500.0000 mg | DELAYED_RELEASE_TABLET | Freq: Two times a day (BID) | ORAL | 4 refills | Status: DC

## 2019-07-30 NOTE — Assessment & Plan Note (Signed)
Acute bilateral low back pain Will obtain lumbar spine film apply Voltaren gel to low back and give patient exercises for the back

## 2019-07-30 NOTE — Patient Instructions (Signed)
Sinaquan (doxepin) changed to 150mg  tablet daily at bedtime Refills on other medications sent to your [facility]  Take voltaren gel and apply to the lower back 3-4 times daily for pain An Xray of the back will be ordered A referral to surgery will be made for the hemorrhoids  We will bring you back for shingles vaccine when our new supply arrives  Use back exercises below,   Do 8-10 reps,  Hold positions for 8 seconds,  Do the reps set two times and do entire session once a day  Consider obtaining roach motel traps for the roaches in your apartment  Return Dr in two months   Back Exercises These exercises help to make your trunk and back strong. They also help to keep the lower back flexible. Doing these exercises can help to prevent back pain or lessen existing pain.  If you have back pain, try to do these exercises 2-3 times each day or as told by your doctor.  As you get better, do the exercises once each day. Repeat the exercises more often as told by your doctor.  To stop back pain from coming back, do the exercises once each day, or as told by your doctor. Exercises Single knee to chest Do these steps 3-5 times in a row for each leg: 1. Lie on your back on a firm bed or the floor with your legs stretched out. 2. Bring one knee to your chest. 3. Grab your knee or thigh with both hands and hold them it in place. 4. Pull on your knee until you feel a gentle stretch in your lower back or buttocks. 5. Keep doing the stretch for 10-30 seconds. 6. Slowly let go of your leg and straighten it. Pelvic tilt Do these steps 5-10 times in a row: 1. Lie on your back on a firm bed or the floor with your legs stretched out. 2. Bend your knees so they point up to the ceiling. Your feet should be flat on the floor. 3. Tighten your lower belly (abdomen) muscles to press your lower back against the floor. This will make your tailbone point up to the ceiling instead of pointing down to your  feet or the floor. 4. Stay in this position for 5-10 seconds while you gently tighten your muscles and breathe evenly. Cat-cow Do these steps until your lower back bends more easily: 1. Get on your hands and knees on a firm surface. Keep your hands under your shoulders, and keep your knees under your hips. You may put padding under your knees. 2. Let your head hang down toward your chest. Tighten (contract) the muscles in your belly. Point your tailbone toward the floor so your lower back becomes rounded like the back of a cat. 3. Stay in this position for 5 seconds. 4. Slowly lift your head. Let the muscles of your belly relax. Point your tailbone up toward the ceiling so your back forms a sagging arch like the back of a cow. 5. Stay in this position for 5 seconds.  Press-ups Do these steps 5-10 times in a row: 1. Lie on your belly (face-down) on the floor. 2. Place your hands near your head, about shoulder-width apart. 3. While you keep your back relaxed and keep your hips on the floor, slowly straighten your arms to raise the top half of your body and lift your shoulders. Do not use your back muscles. You may change where you place your hands in order to  make yourself more comfortable. 4. Stay in this position for 5 seconds. 5. Slowly return to lying flat on the floor.  Bridges Do these steps 10 times in a row: 1. Lie on your back on a firm surface. 2. Bend your knees so they point up to the ceiling. Your feet should be flat on the floor. Your arms should be flat at your sides, next to your body. 3. Tighten your butt muscles and lift your butt off the floor until your waist is almost as high as your knees. If you do not feel the muscles working in your butt and the back of your thighs, slide your feet 1-2 inches farther away from your butt. 4. Stay in this position for 3-5 seconds. 5. Slowly lower your butt to the floor, and let your butt muscles relax. If this exercise is too easy, try  doing it with your arms crossed over your chest. Belly crunches Do these steps 5-10 times in a row: 1. Lie on your back on a firm bed or the floor with your legs stretched out. 2. Bend your knees so they point up to the ceiling. Your feet should be flat on the floor. 3. Cross your arms over your chest. 4. Tip your chin a little bit toward your chest but do not bend your neck. 5. Tighten your belly muscles and slowly raise your chest just enough to lift your shoulder blades a tiny bit off of the floor. Avoid raising your body higher than that, because it can put too much stress on your low back. 6. Slowly lower your chest and your head to the floor. Back lifts Do these steps 5-10 times in a row: 1. Lie on your belly (face-down) with your arms at your sides, and rest your forehead on the floor. 2. Tighten the muscles in your legs and your butt. 3. Slowly lift your chest off of the floor while you keep your hips on the floor. Keep the back of your head in line with the curve in your back. Look at the floor while you do this. 4. Stay in this position for 3-5 seconds. 5. Slowly lower your chest and your face to the floor. Contact a doctor if:  Your back pain gets a lot worse when you do an exercise.  Your back pain does not get better 2 hours after you exercise. If you have any of these problems, stop doing the exercises. Do not do them again unless your doctor says it is okay. Get help right away if:  You have sudden, very bad back pain. If this happens, stop doing the exercises. Do not do them again unless your doctor says it is okay. This information is not intended to replace advice given to you by your health care provider. Make sure you discuss any questions you have with your health care provider. Document Revised: 10/03/2017 Document Reviewed: 10/03/2017 Elsevier Patient Education  2020 [employer].

## 2019-07-30 NOTE — Progress Notes (Signed)
Here to speak plan of care for ongoing hemorrhoid

## 2019-07-30 NOTE — Assessment & Plan Note (Signed)
Overactive bladder we will refill Vesicare

## 2019-07-30 NOTE — Assessment & Plan Note (Signed)
Significant external hemorrhoids may also have internal hemorrhoids will refer to general surgery for evaluation

## 2019-07-30 NOTE — Telephone Encounter (Signed)
Called pt stated previously was prescribed oxybuteynin by his urologist, on his bottle stated to be taken 1 tablet 3X day and the Vesicare prescribed today is only  time a day . Will f /u with Dr Delford Field. Thank you

## 2019-07-30 NOTE — Assessment & Plan Note (Signed)
Anxiety and depression stable at this time

## 2019-07-30 NOTE — Assessment & Plan Note (Signed)
Seizure disorder stable on Depakote continue same

## 2019-07-30 NOTE — Assessment & Plan Note (Signed)
Sleep apnea now on CPAP therapy tolerating this well
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
## 2019-07-30 NOTE — Telephone Encounter (Signed)
Copied from CRM [id number]. Topic: General - Other >> Jul 30, 2019  4:24 PM Tamela Oddi wrote: Reason for CRM: Patient called to ask the nurse to call him regarding his medication.  He believes that the dosage is incorrect.  Patient believes he needs a new script for his solifenacin (VESICARE) 5 MG tablet.  He states the bottle he has at home says he takes 1 tablet 3x daily and the one he picked up says 1x daily.  Please advise and call patient to discuss at [phone]

## 2019-07-30 NOTE — Assessment & Plan Note (Signed)
Hypertension well controlled on hydrocal thiazide metoprolol will continue same

## 2019-07-30 NOTE — Telephone Encounter (Signed)
Could you please contact pt  

## 2019-07-31 ENCOUNTER — Other Ambulatory Visit: Payer: Self-pay | Admitting: Critical Care Medicine

## 2019-07-31 MED ORDER — OXYBUTYNIN CHLORIDE 5 MG PO TABS: 5.0000 mg | ORAL_TABLET | Freq: Three times a day (TID) | ORAL | 3 refills | Status: DC

## 2019-07-31 MED ORDER — DOXEPIN HCL 50 MG PO CAPS: 100.0000 mg | ORAL_CAPSULE | Freq: Every day | ORAL | 2 refills | Status: DC

## 2019-07-31 NOTE — Telephone Encounter (Signed)
Hold vesicare, go back and get oxybutinin

## 2019-07-31 NOTE — Telephone Encounter (Signed)
Spoke with the patient said he already picked up Vesicare from pharmacy. Should he stay with this med or wait and pick up oxybutynin meds? Please advice!Tks

## 2019-07-31 NOTE — Telephone Encounter (Signed)
I sent incorrect rx to the pharmacy based on the med rec.  Call pts pharmacy to cancel vesicare. I will re semd rx for oxybutonin tid

## 2019-07-31 NOTE — Telephone Encounter (Signed)
Called pt message given  

## 2019-08-02 ENCOUNTER — Other Ambulatory Visit: Payer: Self-pay

## 2019-08-02 ENCOUNTER — Emergency Department (HOSPITAL_COMMUNITY)
Admission: EM | Admit: 2019-08-02 | Discharge: 2019-08-02 | Disposition: A | Discharge status: Home / Self Care | Payer: Medicaid Other | Attending: Emergency Medicine | Admitting: Emergency Medicine

## 2019-08-02 ENCOUNTER — Emergency Department (HOSPITAL_COMMUNITY): Payer: Medicaid Other

## 2019-08-02 DIAGNOSIS — Z79899 Other long term (current) drug therapy: Secondary | ICD-10-CM | POA: Diagnosis not present

## 2019-08-02 DIAGNOSIS — I10 Essential (primary) hypertension: Secondary | ICD-10-CM | POA: Insufficient documentation

## 2019-08-02 DIAGNOSIS — Z7982 Long term (current) use of aspirin: Secondary | ICD-10-CM | POA: Insufficient documentation

## 2019-08-02 DIAGNOSIS — R072 Precordial pain: Secondary | ICD-10-CM | POA: Insufficient documentation

## 2019-08-02 LAB — BASIC METABOLIC PANEL
Anion gap: 7 (ref 5–15)
BUN: 19 mg/dL (ref 6–20)
CO2: 24 mmol/L (ref 22–32)
Calcium: 8.5 mg/dL — ABNORMAL LOW (ref 8.9–10.3)
Chloride: 106 mmol/L (ref 98–111)
Creatinine, Ser: 1.21 mg/dL (ref 0.61–1.24)
GFR calc Af Amer: >60 mL/min (ref >60)
GFR calc non Af Amer: >60 mL/min (ref >60)
Glucose, Bld: 87 mg/dL (ref 70–99)
Potassium: 3.5 mmol/L (ref 3.5–5.1)
Sodium: 137 mmol/L (ref 135–145)

## 2019-08-02 LAB — CBC
HCT: 38.1 % — ABNORMAL LOW (ref 39.0–52.0)
Hemoglobin: 12.5 g/dL — ABNORMAL LOW (ref 13.0–17.0)
MCH: 28.7 pg (ref 26.0–34.0)
MCHC: 32.8 g/dL (ref 30.0–36.0)
MCV: 87.4 fL (ref 80.0–100.0)
Platelets: 195 10*3/uL (ref 150–400)
RBC: 4.36 MIL/uL (ref 4.22–5.81)
RDW: 14.8 % (ref 11.5–15.5)
WBC: 5.2 10*3/uL (ref 4.0–10.5)
nRBC: 0 % (ref 0.0–0.2)

## 2019-08-02 LAB — TROPONIN I (HIGH SENSITIVITY)
Troponin I (High Sensitivity): 3 ng/L (ref <18)
Troponin I (High Sensitivity): 3 ng/L (ref <18)

## 2019-08-02 MED ORDER — ASPIRIN 81 MG PO CHEW
81.0000 mg | CHEWABLE_TABLET | Freq: Every day | ORAL | 0 refills | Status: AC
Start: 2019-08-02 — End: 2019-09-01

## 2019-08-02 MED ORDER — SODIUM CHLORIDE 0.9% FLUSH: 3.0000 mL | Freq: Once | INTRAVENOUS | Status: DC

## 2019-08-02 NOTE — Discharge Instructions (Signed)

## 2019-08-02 NOTE — ED Provider Notes (Signed)
[facility] EMERGENCY DEPARTMENT Provider Note   CSN: [id number] Arrival date & time: 08/02/19  1045     History Chief Complaint  Patient presents with  . Chest Pain    Kenji Mapel is a 59 y.o. male.  HPI  HPI: A 59 year old patient with a history of hypertension and obesity presents for evaluation of chest pain. Initial onset of pain was less than one hour ago. The patient's chest pain is sharp, is not worse with exertion and is relieved by nitroglycerin. The patient's chest pain is middle- or left-sided, is not well-localized, is not described as heaviness/pressure/tightness and does not radiate to the arms/jaw/neck. The patient does not complain of nausea and denies diaphoresis. The patient has no history of stroke, has no history of peripheral artery disease, has not smoked in the past 90 days, denies any history of treated diabetes, has no relevant family history of coronary artery disease (first degree relative at less than age 69) and has no history of hypercholesterolemia.   Past Medical History:  Diagnosis Date  . Enlarged prostate   . Hypertension   . Severe episode of recurrent major depressive disorder, with psychotic features (HCC) 03/24/2019    Patient Active Problem List   Diagnosis Date Noted  . External hemorrhoid 07/30/2019  . Acute bilateral low back pain without sciatica 07/30/2019  . Overactive bladder 06/11/2019  . OSA on CPAP 03/24/2019  . Essential hypertension 03/24/2019  . Anxiety and depression 03/24/2019  . Seizure disorder (HCC) 03/24/2019    Past Surgical History:  Procedure Laterality Date  . CYSTECTOMY    . HERNIA REPAIR         Family History  Family history unknown: Yes    Social History   Tobacco Use  . Smoking status: Never Smoker  . Smokeless tobacco: Never Used  Vaping Use  . Vaping Use: Never used  Substance Use Topics  . Alcohol use: Never  . Drug use: Never    Home Medications Prior to Admission  medications   Medication Sig Start Date End Date Taking? Authorizing Provider  cholecalciferol (VITAMIN D3) 25 MCG (1000 UNIT) tablet Take 2,000 Units by mouth daily.   Yes [provider]  diclofenac Sodium (VOLTAREN) 1 % GEL Apply 2 g topically 4 (four) times daily. 07/30/19  Yes Storm Frisk, MD  divalproex (DEPAKOTE) 500 MG DR tablet Take 1 tablet (500 mg total) by mouth 2 (two) times daily. 07/30/19  Yes Storm Frisk, MD  doxepin (SINEQUAN) 150 MG capsule Take 150 mg by mouth at bedtime. 07/31/19  Yes [provider]  doxepin (SINEQUAN) 50 MG capsule Take 2 capsules (100 mg total) by mouth at bedtime. Patient taking differently: Take 50-150 mg by mouth at bedtime.  07/31/19  Yes Storm Frisk, MD  gabapentin (NEURONTIN) 300 MG capsule Take 2 capsules (600 mg total) by mouth 3 (three) times daily. 07/30/19 08/29/19 Yes Storm Frisk, MD  loratadine (CLARITIN) 10 MG tablet Take 1 tablet (10 mg total) by mouth daily. 06/11/19  Yes Storm Frisk, MD  metoprolol succinate (TOPROL-XL) 25 MG 24 hr tablet Take 1 tablet (25 mg total) by mouth daily. 06/11/19  Yes Storm Frisk, MD  oxybutynin (DITROPAN) 5 MG tablet Take 1 tablet (5 mg total) by mouth 3 (three) times daily. 07/31/19  Yes Storm Frisk, MD  aspirin 81 MG chewable tablet Chew 1 tablet (81 mg total) by mouth daily. 08/02/19 09/01/19  Derwood Kaplan, MD  hydrochlorothiazide (HYDRODIURIL) 25 MG tablet Take 1 tablet (25 mg total) by mouth daily. Patient not taking: Reported on 08/02/2019 06/11/19   Storm Frisk, MD    Allergies    Losartan  Review of Systems   Review of Systems  Constitutional: Positive for activity change.  Respiratory: Positive for chest tightness.   Cardiovascular: Positive for chest pain.  Gastrointestinal: Negative for nausea and vomiting.  All other systems reviewed and are negative.   Physical Exam Updated Vital Signs BP 120/80 (BP Location: Left Arm)   Pulse 61   Temp  98.1 F (36.7 C) (Oral)   Resp 16   Ht 5\' 11"  (1.803 m)   Wt 109 kg   SpO2 100%   BMI 33.52 kg/m   Physical Exam Vitals and nursing note reviewed.  Constitutional:      Appearance: He is well-developed.  HENT:     Head: Atraumatic.  Cardiovascular:     Rate and Rhythm: Normal rate.     Heart sounds: Normal heart sounds.  Pulmonary:     Effort: Pulmonary effort is normal.  Musculoskeletal:     Cervical back: Neck supple.  Skin:    General: Skin is warm.  Neurological:     Mental Status: He is alert and oriented to person, place, and time.     ED Results / Procedures / Treatments   Labs (all labs ordered are listed, but only abnormal results are displayed) Labs Reviewed  BASIC METABOLIC PANEL - Abnormal; Notable for the following components:      Result Value   Calcium 8.5 (*)    All other components within normal limits  CBC - Abnormal; Notable for the following components:   Hemoglobin 12.5 (*)    HCT 38.1 (*)    All other components within normal limits  TROPONIN I (HIGH SENSITIVITY)  TROPONIN I (HIGH SENSITIVITY)    EKG EKG Interpretation  Date/Time:  Sunday August 02 2019 10:57:44 EDT Ventricular Rate:  75 PR Interval:    QRS Duration: 93 QT Interval:  391 QTC Calculation: 437 R Axis:   -20 Text Interpretation: Sinus rhythm Borderline left axis deviation No acute changes No significant change since last tracing Confirmed by 02-03-1976 [id number]) on 08/02/2019 11:12:13 AM   Radiology DG Chest 2 View  Result Date: 08/02/2019 CLINICAL DATA:  Chest pain and hypertension EXAM: CHEST - 2 VIEW COMPARISON:  None. FINDINGS: There is mild atelectasis in the left base. The lungs elsewhere are clear. Heart size and pulmonary vascularity are normal. No adenopathy. No bone lesions. IMPRESSION: Mild left base atelectasis. Lungs elsewhere clear. Cardiac silhouette normal. Electronically Signed   By: 10/03/2019 III M.D.   On: 08/02/2019 11:52     Procedures Procedures (including critical care time)  Medications Ordered in ED Medications  sodium chloride flush (NS) 0.9 % injection 3 mL (has no administration in time range)    ED Course  I have reviewed the triage vital signs and the nursing notes.  Pertinent labs & imaging results that were available during my care of the patient were reviewed by me and considered in my medical decision making (see chart for details).    MDM Rules/Calculators/A&P HEAR Score: 2                        Differential diagnosis includes: ACS syndrome Aortic dissection CHF exacerbation Valvular disorder Myocarditis Pericarditis Endocarditis Pericardial effusion / tamponade Pneumonia Pleural effusion / Pulmonary edema PE  Pneumothorax Musculoskeletal pain PUD / Gastritis / Esophagitis Esophageal spasm  Chest pain - nonspecific. HEAR score is 2. No PE, dissection risk factors. Delta trop ordered - if neg, d/c.  Final Clinical Impression(s) / ED Diagnoses Final diagnoses:  Precordial chest pain    Rx / DC Orders ED Discharge Orders         Ordered    aspirin 81 MG chewable tablet  Daily     Discontinue  Reprint     08/02/19 1635           Derwood Kaplan, MD 08/02/19 2103

## 2019-08-02 NOTE — ED Notes (Signed)
Patient transported to X-ray 

## 2019-08-02 NOTE — ED Triage Notes (Signed)
Pt arrives via EMS from home with complaints of chest pain X3 hours. Per EMS chest pain is on right side, non radiating, sharp and constant. 10/10 pain.Pt eating breakfast when CP began. New onset. Pt endorses sweating, denies SOB, dizziness  EMS gave 324 ASA with relief of 7/10 0.4 nitro with relief 2/10  140/90 HR 80 RR 16 97% RA 97.2 T

## 2019-08-06 ENCOUNTER — Telehealth: Payer: Self-pay | Admitting: Critical Care Medicine

## 2019-08-06 ENCOUNTER — Ambulatory Visit: Payer: Medicaid Other | Admitting: Critical Care Medicine

## 2019-08-06 NOTE — Telephone Encounter (Signed)
Please follow yp   Copied from CRM #[id number]. Topic: General - Other >> Jul 29, 2019  8:32 AM Wyonia Hough E wrote: Reason for CRM: Pt called about a bill he received for a visit on 9.16.20 with behavioral health with [facility]/ the visit was not covered and pt would like a call from someone who can assist in explaining this visit and the payment/ Pt has spoke with billing and was told to call the office/ please advise

## 2019-08-06 NOTE — Telephone Encounter (Signed)
Pt was advice what he need to do with the bill since he had [employer] for the time of service, that visit was cover but since also he just got medicaid since 07/23/19, medicaid can also cover the bill

## 2019-08-13 ENCOUNTER — Other Ambulatory Visit: Payer: Self-pay

## 2019-08-13 ENCOUNTER — Ambulatory Visit: Payer: Medicaid Other | Attending: Critical Care Medicine | Admitting: Pharmacist

## 2019-08-13 ENCOUNTER — Encounter: Payer: Self-pay | Admitting: Pharmacist

## 2019-08-13 DIAGNOSIS — Z23 Encounter for immunization: Secondary | ICD-10-CM

## 2019-08-13 NOTE — Progress Notes (Signed)
Patient presents for vaccination against Zoster per orders of Dr. Delford Field. Consent given. Counseling provided. No contraindications exists. Vaccine administered without incident.

## 2019-08-15 ENCOUNTER — Encounter (HOSPITAL_COMMUNITY): Payer: Self-pay

## 2019-08-15 ENCOUNTER — Other Ambulatory Visit: Payer: Self-pay

## 2019-08-15 ENCOUNTER — Emergency Department (HOSPITAL_COMMUNITY)
Admission: EM | Admit: 2019-08-15 | Discharge: 2019-08-15 | Disposition: A | Discharge status: Home / Self Care | Payer: Medicaid Other | Attending: Emergency Medicine | Admitting: Emergency Medicine

## 2019-08-15 DIAGNOSIS — R202 Paresthesia of skin: Secondary | ICD-10-CM | POA: Insufficient documentation

## 2019-08-15 DIAGNOSIS — Z5321 Procedure and treatment not carried out due to patient leaving prior to being seen by health care provider: Secondary | ICD-10-CM | POA: Insufficient documentation

## 2019-08-15 NOTE — ED Triage Notes (Signed)
He tells [place] that he noted himself to have left thigh "numbness" at 0500 today, which persists. He is ambulatory without issue and is in no distress. He denies any injury/illness.

## 2019-08-17 ENCOUNTER — Ambulatory Visit: Payer: Self-pay | Admitting: Critical Care Medicine

## 2019-08-17 NOTE — Telephone Encounter (Signed)
Pt reports had shingles vaccine last Thursday or Friday, cannot recall. Reports given in right arm. States Saturday "Woke up with left leg numb from below waist to knee. States numbness is constant, "But a little better today."  Reports severe itching, no rash, no redness or warmth. Denies fever, no headache. States entire area numb. Advised UC, pt states will follow disposition.  Pt also states he would like a referral for a mammogram for himself. Has not noted lump, no symptoms, "Just to be on safe side."  CB# [phone]  Reason for Disposition  [1] Numbness (i.e., loss of sensation) of the face, arm / hand, or leg / foot on one side of the body AND [2] gradual onset (e.g., days to weeks) AND [3] present now  Answer Assessment - Initial Assessment Questions 1. SYMPTOM: "What is the main symptom you are concerned about?" (e.g., weakness, numbness)     Numbness left leg 2. ONSET: "When did this start?" (minutes, hours, days; while sleeping)     Saturday 3. LAST NORMAL: "When was the last time you were normal (no symptoms)?"     Saturday 4. PATTERN "Does this come and go, or has it been constant since it started?"  "Is it present now?"    Itching constant, Numbness a little better today 5. CARDIAC SYMPTOMS: "Have you had any of the following symptoms: chest pain, difficulty breathing, palpitations?"     no 6. NEUROLOGIC SYMPTOMS: "Have you had any of the following symptoms: headache, dizziness, vision loss, double vision, changes in speech, unsteady on your feet?"    no 7. OTHER SYMPTOMS: "Do you have any other symptoms?"     no  Protocols used: NEUROLOGIC DEFICIT-A-AH

## 2019-08-18 ENCOUNTER — Ambulatory Visit: Payer: Medicaid Other | Attending: Adult Health | Admitting: Physical Therapy

## 2019-08-18 ENCOUNTER — Ambulatory Visit: Payer: Medicaid Other | Admitting: Physician Assistant

## 2019-08-18 ENCOUNTER — Other Ambulatory Visit: Payer: Self-pay

## 2019-08-18 ENCOUNTER — Encounter: Payer: Self-pay | Admitting: Physical Therapy

## 2019-08-18 ENCOUNTER — Telehealth: Payer: Self-pay | Admitting: Critical Care Medicine

## 2019-08-18 VITALS — BP 129/75 | HR 98 | Temp 98.7°F | Resp 18 | Ht 71.0 in | Wt 234.0 lb

## 2019-08-18 DIAGNOSIS — R200 Anesthesia of skin: Secondary | ICD-10-CM

## 2019-08-18 DIAGNOSIS — L299 Pruritus, unspecified: Secondary | ICD-10-CM | POA: Diagnosis not present

## 2019-08-18 DIAGNOSIS — R278 Other lack of coordination: Secondary | ICD-10-CM | POA: Diagnosis present

## 2019-08-18 DIAGNOSIS — R202 Paresthesia of skin: Secondary | ICD-10-CM | POA: Diagnosis not present

## 2019-08-18 DIAGNOSIS — R252 Cramp and spasm: Secondary | ICD-10-CM | POA: Insufficient documentation

## 2019-08-18 MED ORDER — HYDROXYZINE HCL 10 MG PO TABS: 10.0000 mg | ORAL_TABLET | Freq: Three times a day (TID) | ORAL | 0 refills | Status: DC | PRN

## 2019-08-18 NOTE — Patient Instructions (Signed)
For your itching, I recommend that you use hydroxyzine every 8 hours as needed.  If you begin to develop any leg weakness, facial weakness, I recommend that you present to the emergency department for prompt evaluation.  Please let us know if there is anything else we can do for you  Roney Jaffe, PA-C Physician Assistant [facility] [email or web address]    Paresthesia Paresthesia is an abnormal burning or prickling sensation. It is usually felt in the hands, arms, legs, or feet. However, it may occur in any part of the body. Usually, paresthesia is not painful. It may feel like:  Tingling or numbness.  Buzzing.  Itching. Paresthesia may occur without any clear cause, or it may be caused by:  Breathing too quickly (hyperventilation).  Pressure on a nerve.  An underlying medical condition.  Side effects of a medication.  Nutritional deficiencies.  Exposure to toxic chemicals. Most people experience temporary (transient) paresthesia at some time in their lives. For some people, it may be long-lasting (chronic) because of an underlying medical condition. If you have paresthesia that lasts a long time, you may need to be evaluated by your health care provider. Follow these instructions at home: Alcohol use   Do not drink alcohol if: ? Your health care provider tells you not to drink. ? You are pregnant, may be pregnant, or are planning to become pregnant.  If you drink alcohol: ? Limit how much you use to:  0-1 drink a day for women.  0-2 drinks a day for men. ? Be aware of how much alcohol is in your drink. In the [place] one drink equals one 12 oz bottle of beer (355 mL), one 5 oz glass of wine (148 mL), or one 1 oz glass of hard liquor (44 mL). Nutrition   Eat a healthy diet. This includes: ? Eating foods that are high in fiber, such as fresh fruits and vegetables, whole grains, and beans. ? Limiting foods  that are high in fat and processed sugars, such as fried or sweet foods. General instructions  Take over-the-counter and prescription medicines only as told by your health care provider.  Do not use any products that contain nicotine or tobacco, such as cigarettes and e-cigarettes. These can keep blood from reaching damaged nerves. If you need help quitting, ask your health care provider.  If you have diabetes, work closely with your health care provider to keep your blood sugar under control.  If you have numbness in your feet: ? Check every day for signs of injury or infection. Watch for redness, warmth, and swelling. ? Wear padded socks and comfortable shoes. These help protect your feet.  Keep all follow-up visits as told by your health care provider. This is important. Contact a health care provider if you:  Have paresthesia that gets worse or does not go away.  Have a burning or prickling feeling that gets worse when you walk.  Have pain, cramps, or dizziness.  Develop a rash. Get help right away if you:  Feel weak.  Have trouble walking or moving.  Have problems with speech, understanding, or vision.  Feel confused.  Cannot control your bladder or bowel movements.  Have numbness after an injury.  Develop new weakness in an arm or leg.  Faint. Summary  Paresthesia is an abnormal burning or prickling sensation that is usually felt in the hands, arms, legs, or feet. It may also occur in other parts of the body.  Paresthesia may occur  without any clear cause, or it may be caused by breathing too quickly (hyperventilation), pressure on a nerve, an underlying medical condition, side effects of a medication, nutritional deficiencies, or exposure to toxic chemicals.  If you have paresthesia that lasts a long time, you may need to be evaluated by your health care provider. This information is not intended to replace advice given to you by your health care provider. Make  sure you discuss any questions you have with your health care provider. Document Revised: 02/03/2018 Document Reviewed: 01/17/2017 Elsevier Patient Education  2020 [employer].

## 2019-08-18 NOTE — Telephone Encounter (Signed)
Patient states that he spoke with Dr. Delford Field yesterday on the telephone but cannot remember what was discussed and if Dr. Delford Field had recommended he go to [facility]. Please advise?

## 2019-08-18 NOTE — Telephone Encounter (Signed)
Made call to patient / does not remember all his conversation with Dr Delford Field based on note advised if lingering symptoms offer to see the provider at our [facility] . Pt agreed . Location and hours of operation provided to the patient

## 2019-08-18 NOTE — Patient Instructions (Addendum)
Bladder Irritants  Certain foods and beverages can be irritating to the bladder.  Avoiding these irritants may decrease your symptoms of urinary urgency, frequency or bladder pain.  Even reducing your intake can help with your symptoms.  Not everyone is sensitive to all bladder irritants, so you may consider focusing on one irritant at a time, removing or reducing your intake of that irritant for 7-10 days to see if this change helps your symptoms.  Water intake is also very important.  Below is a list of bladder irritants.  Drinks: alcohol, carbonated beverages, caffeinated beverages such as coffee and tea, drinks with artificial sweeteners, citrus juices, apple juice, tomato juice  Foods: tomatoes and tomato based foods, spicy food, sugar and artificial sweeteners, vinegar, chocolate, raw onion, apples, citrus fruits, pineapple, cranberries, tomatoes, strawberries, plums, peaches, cantaloupe  Other: acidic urine (too concentrated) - see water intake info below  Substitutes you can try that are NOT irritating to the bladder: cooked onion, pears, papayas, sun-brewed decaf teas, watermelons, non-citrus herbal teas, apricots, kava and low-acid instant drinks (Postum).    WATER INTAKE: Remember to drink lots of water (aim for fluid intake of half your body weight with 2/3 of fluids being water).  You may be limiting fluids due to fear of leakage, but this can actually worsen urgency symptoms due to highly concentrated urine.  Water helps balance the pH of your urine so it doesn't become too acidic - acidic urine is a bladder irritant!   Access Code: [email or web address] URL: [email or web address]: 07/27/2021Prepared by: Loistine Simas BeuhringExercises  Supine Butterfly Groin Stretch - 2 x daily - 7 x weekly - 1 sets - 2 reps - 60 hold  Sidelying Diaphragmatic Breathing - 2 x daily - 7 x weekly - 1 sets - 60 reps - 3 hold

## 2019-08-18 NOTE — Progress Notes (Signed)
Established Patient Office Visit  Subjective:  Patient ID: Jeremiah Hunter, male    DOB: [birth year]  Age: 59 y.o. MRN: [id number]  CC:  Chief Complaint  Patient presents with  . Allergic Reaction    SHINGLES VACCINE    HPI Jeremiah Hunter reports that he started having numbness in his left thigh 4 days ago.  Reports he has never had anything like this before, denies back pain, denies injury /trauma.  Reports the numbness is improving but he has had intense itching in the area.  Denies any rash.  Has not tried anything for relief.  Reports that he was given a shingles vaccine 2 days earlier in his right arm and is concerned that this is connected.     Past Medical History:  Diagnosis Date  . Enlarged prostate   . Hypertension   . Severe episode of recurrent major depressive disorder, with psychotic features (HCC) 03/24/2019    Past Surgical History:  Procedure Laterality Date  . CYSTECTOMY    . HERNIA REPAIR      Family History  Family history unknown: Yes    Social History   Socioeconomic History  . Marital status: Single    Spouse name: Not on file  . Number of children: Not on file  . Years of education: Not on file  . Highest education level: Not on file  Occupational History  . Not on file  Tobacco Use  . Smoking status: Never Smoker  . Smokeless tobacco: Never Used  Vaping Use  . Vaping Use: Never used  Substance and Sexual Activity  . Alcohol use: Never  . Drug use: Never  . Sexual activity: Not Currently  Other Topics Concern  . Not on file  Social History Narrative  . Not on file   Social Determinants of Health   Financial Resource Strain:   . Difficulty of Paying Living Expenses:   Food Insecurity:   . Worried About Programme researcher, broadcasting/film/video in the Last Year:   . Barista in the Last Year:   Transportation Needs:   . Freight forwarder (Medical):   Marland Kitchen Lack of Transportation (Non-Medical):   Physical Activity:   . Days of Exercise  per Week:   . Minutes of Exercise per Session:   Stress:   . Feeling of Stress :   Social Connections:   . Frequency of Communication with Friends and Family:   . Frequency of Social Gatherings with Friends and Family:   . Attends Religious Services:   . Active Member of Clubs or Organizations:   . Attends Banker Meetings:   Marland Kitchen Marital Status:   Intimate Partner Violence:   . Fear of Current or Ex-Partner:   . Emotionally Abused:   Marland Kitchen Physically Abused:   . Sexually Abused:     Outpatient Medications Prior to Visit  Medication Sig Dispense Refill  . aspirin 81 MG chewable tablet Chew 1 tablet (81 mg total) by mouth daily. 30 tablet 0  . cholecalciferol (VITAMIN D3) 25 MCG (1000 UNIT) tablet Take 2,000 Units by mouth daily.    . diclofenac Sodium (VOLTAREN) 1 % GEL Apply 2 g topically 4 (four) times daily. 50 g 0  . divalproex (DEPAKOTE ER) 500 MG 24 hr tablet Take by mouth.    . divalproex (DEPAKOTE) 500 MG DR tablet Take 1 tablet (500 mg total) by mouth 2 (two) times daily. 60 tablet 4  . doxepin (SINEQUAN) 150  MG capsule Take 150 mg by mouth at bedtime.    Marland Kitchen doxepin (SINEQUAN) 50 MG capsule Take 2 capsules (100 mg total) by mouth at bedtime. (Patient taking differently: Take 50-150 mg by mouth at bedtime. ) 60 capsule 2  . gabapentin (NEURONTIN) 300 MG capsule Take 2 capsules (600 mg total) by mouth 3 (three) times daily. 180 capsule 1  . hydrochlorothiazide (HYDRODIURIL) 25 MG tablet Take 1 tablet (25 mg total) by mouth daily. (Patient not taking: Reported on 08/02/2019) 90 tablet 3  . loratadine (CLARITIN) 10 MG tablet Take 1 tablet (10 mg total) by mouth daily. 30 tablet 4  . metoprolol succinate (TOPROL-XL) 25 MG 24 hr tablet Take 1 tablet (25 mg total) by mouth daily. 90 tablet 3  . oxybutynin (DITROPAN) 5 MG tablet Take 1 tablet (5 mg total) by mouth 3 (three) times daily. 90 tablet 3   No facility-administered medications prior to visit.    Allergies  Allergen  Reactions  . Losartan Itching    ROS Review of Systems  Constitutional: Negative for chills, fatigue and fever.  HENT: Negative.   Eyes: Negative.   Respiratory: Negative.   Cardiovascular: Negative.   Gastrointestinal: Negative.   Endocrine: Negative.   Genitourinary: Negative for penile pain, penile swelling, testicular pain and urgency.  Musculoskeletal: Negative.   Skin: Negative for rash.  Allergic/Immunologic: Negative.   Neurological: Negative for weakness and headaches.  Hematological: Negative.   Psychiatric/Behavioral: Negative.       Objective:    Physical Exam Vitals and nursing note reviewed.  Constitutional:      General: He is not in acute distress.    Appearance: Normal appearance. He is not ill-appearing.  HENT:     Head: Normocephalic and atraumatic.     Right Ear: External ear normal.     Left Ear: External ear normal.     Nose: Nose normal.     Mouth/Throat:     Mouth: Mucous membranes are moist.  Eyes:     Extraocular Movements: Extraocular movements intact.     Pupils: Pupils are equal, round, and reactive to light.  Cardiovascular:     Rate and Rhythm: Normal rate and regular rhythm.     Pulses: Normal pulses.     Heart sounds: Normal heart sounds.  Pulmonary:     Effort: Pulmonary effort is normal.     Breath sounds: Normal breath sounds.  Abdominal:     General: Abdomen is flat.     Palpations: Abdomen is soft.  Musculoskeletal:        General: Normal range of motion.     Cervical back: Normal range of motion and neck supple.     Right upper leg: Normal.     Left upper leg: Normal. No swelling or tenderness.  Skin:    General: Skin is warm and dry.     Findings: No rash.  Neurological:     General: No focal deficit present.     Mental Status: He is alert and oriented to person, place, and time.  Psychiatric:        Mood and Affect: Mood normal.        Behavior: Behavior normal.        Thought Content: Thought content normal.         Judgment: Judgment normal.     BP (!) 129/75 (BP Location: Left Arm, Patient Position: Sitting, Cuff Size: Large)   Pulse 98   Temp 98.7 F (37.1 C) (Oral)  Resp 18   Ht 5\' 11"  (1.803 m)   Wt (!) 234 lb (106.1 kg)   SpO2 100%   BMI 32.64 kg/m  Wt Readings from Last 3 Encounters:  08/18/19 (!) 234 lb (106.1 kg)  08/02/19 240 lb 4.8 oz (109 kg)  07/30/19 241 lb (109.3 kg)     There are no preventive care reminders to display for this patient.  There are no preventive care reminders to display for this patient.  Lab Results  Component Value Date   TSH 2.170 03/24/2019   Lab Results  Component Value Date   WBC 5.2 08/02/2019   HGB 12.5 (L) 08/02/2019   HCT 38.1 (L) 08/02/2019   MCV 87.4 08/02/2019   PLT 195 08/02/2019   Lab Results  Component Value Date   NA 137 08/02/2019   K 3.5 08/02/2019   CO2 24 08/02/2019   GLUCOSE 87 08/02/2019   BUN 19 08/02/2019   CREATININE 1.21 08/02/2019   BILITOT 0.4 03/24/2019   ALKPHOS 106 03/24/2019   AST 19 03/24/2019   ALT 13 03/24/2019   PROT 6.6 03/24/2019   ALBUMIN 4.2 03/24/2019   CALCIUM 8.5 (L) 08/02/2019   ANIONGAP 7 08/02/2019   No results found for: CHOL No results found for: HDL No results found for: LDLCALC No results found for: TRIG No results found for: CHOLHDL No results found for: 10/03/2019    Assessment & Plan:   Problem List Items Addressed This Visit    None    Visit Diagnoses    Numbness and tingling of left leg    -  Primary   Localized pruritus       Relevant Medications   hydrOXYzine (ATARAX/VISTARIL) 10 MG tablet      Meds ordered this encounter  Medications  . hydrOXYzine (ATARAX/VISTARIL) 10 MG tablet    Sig: Take 1 tablet (10 mg total) by mouth 3 (three) times daily as needed.    Dispense:  30 tablet    Refill:  0    Order Specific Question:   Supervising Provider    Answer:   [id number], PATRICK E [[id number]]  1. Numbness and tingling of left leg Reassurance given.  Numbness is  improving and mostly resolved.  Full sensation in upper left thigh.  Patient education given on red flags for prompt evaluation at emergency department.  2. Localized pruritus Trial hydroxyzine 10 mg 3 times a day as needed - hydrOXYzine (ATARAX/VISTARIL) 10 MG tablet; Take 1 tablet (10 mg total) by mouth 3 (three) times daily as needed.  Dispense: 30 tablet; Refill: 0   I have reviewed the patient's medical history (PMH, PSH, Social History, Family History, Medications, and allergies) , and have been updated if relevant. I spent 20 minutes reviewing chart and  face to face time with patient.    Follow-up: Return if symptoms worsen or fail to improve.    Delford Field Mayers, PA-C

## 2019-08-18 NOTE — Therapy (Signed)
[facility] [address], [address] [place], [place], [address] Phone: [phone]   Fax:  [phone]  Physical Therapy Evaluation  Patient Details  Name: Jeremiah Hunter MRN: [id number] Date of Birth: Jul 23, 1960 Referring Provider (PT): Vanessa Barbara, NP   Encounter Date: 08/18/2019   PT End of Session - 08/18/19 1011    Visit Number 1    Date for PT Re-Evaluation 10/13/19    Authorization Type Medicaid - submitting auth    PT Start Time 1011    PT Stop Time 1100    PT Time Calculation (min) 49 min    Activity Tolerance Patient tolerated treatment well    Behavior During Therapy [facility] for tasks assessed/performed           Past Medical History:  Diagnosis Date  . Enlarged prostate   . Hypertension   . Severe episode of recurrent major depressive disorder, with psychotic features (HCC) 03/24/2019    Past Surgical History:  Procedure Laterality Date  . CYSTECTOMY    . HERNIA REPAIR      There were no vitals filed for this visit.    Subjective Assessment - 08/18/19 1012    Subjective Pt referred to OPPT with diagnosis of frequency of micturition.  Pt experiences nocturia 5-7x/night with hesitancy, stop/start stream, split stream and some pushing to void.  Symptoms are present during day as well.  PMH includes BPH and hernia repair.  Pt spilled last medication and can't get more x 70 days. Pt denies constipation and has a BM daily.    Pertinent History BPH, hernia repair, upcoming hemmorhoid surgery, intermittent back pain    Currently in Pain? No/denies              [facility] PT Assessment - 08/18/19 0001      Assessment   Medical Diagnosis R35.0 (ICD-10-CM) - Frequency of micturition    Referring Provider (PT) Vanessa Barbara, NP    Onset Date/Surgical Date --   3 years, worsening   Next MD Visit --   6 week follow up   Prior Therapy no      Precautions   Precautions None      Restrictions   Weight Bearing  Restrictions No      Balance Screen   Has the patient fallen in the past 6 months No      Home Environment   Living Environment Private residence    Living Arrangements Alone    Type of Home Apartment    Home Access Stairs to enter    Entrance Stairs-Number of Steps --   3 flights   Home Layout One level      Prior Function   Level of Independence Independent    Vocation On disability    Leisure bike      Cognition   Overall Cognitive Status Within Functional Limits for tasks assessed      Observation/Other Assessments   Skin Integrity limited skin rolling lumbar region, good scar mobililty lower abdomen from past hernia repairs bil      Functional Tests   Functional tests Single leg stance;Squat      Squat   Comments ind      Single Leg Stance   Comments unable to balance Rt and Lt > 2 sec      Posture/Postural Control   Posture/Postural Control No significant limitations      ROM / Strength   AROM / PROM / Strength AROM;Strength  AROM   AROM Assessment Site Lumbar;Hip    Right/Left Hip Left;Right    Right Hip External Rotation  55    Left Hip External Rotation  55    Lumbar Flexion 45    Lumbar Extension 5    Lumbar - Right Side Bend 10    Lumbar - Left Side Bend 10      Strength   Overall Strength Comments grossly 4/5 bil LEs, core 4-/5      Flexibility   Soft Tissue Assessment /Muscle Length yes   adductors limited bil 30%   Hamstrings Lt 60, Rt 65    Piriformis limited 30% bil      Palpation   Spinal mobility limited lumbar PAs, limited ribcage mobility, decreased diaphragmatic breathing    SI assessment  WNL                      Objective measurements completed on examination: See above findings.     Pelvic Floor Special Questions - 08/18/19 0001    Prior Pelvic/Prostate Exam Yes    Date of Last Pelvic/Prostate Exam --   may 2021   Result Pelvic/Prostate Exam  negative, told prostate was not enlarged    Prior Urinalysis Yes     Date of last Urinalysis --   may 2021   Result of last Urinalysis clear    Currently Sexually Active No    History of sexually transmitted disease Yes    Urinary Leakage No    Urinary urgency Yes    Urinary frequency 1-3x/hour    Fecal incontinence No    Fluid intake 4 small bottles of water, ginger ale    Caffeine beverages some    External Perineal Exam PT explained assessment and Pt gave verbal consent    Skin Integrity Hemorroids    Perineal Body/Introitus  Elevated    External Palpation tender bil    Prolapse None    Pelvic Floor Internal Exam PT explained assessment and Pt gave verbal consent    Exam Type Rectal    Sensation intact    Palpation tender bil puborectalis, levator ani    Strength fair squeeze, definite lift   improved coordination with repeated efforts   Strength # of reps 3    Strength # of seconds 5    Tone increased                    PT Education - 08/18/19 1114    Education Details bladder irritants, Access Code: [email or web address]    Person(s) Educated Patient    Methods Explanation;Demonstration;Handout    Comprehension Verbalized understanding;Returned demonstration            PT Short Term Goals - 08/18/19 1133      PT SHORT TERM GOAL #1   Title Pt will be ind with initial HEP for trunk and LE flexibility and mobility.    Time 3    Period Weeks    Status New    Target Date 09/08/19      PT SHORT TERM GOAL #2   Title Pt will report reduced frequency of urination to 1-2/hour    Baseline up to 3x/hour    Time 4    Period Weeks    Status New    Target Date 09/15/19             PT Long Term Goals - 08/18/19 1134      PT LONG TERM GOAL #1  Title Pt will be ind with HEP for improved trunk, hip and LE flexibility and mobility to improve overall functional mobility.    Baseline no knowledge    Time 8    Period Weeks    Status New    Target Date 10/13/19      PT LONG TERM GOAL #2   Title Pt will reduce IPSS score to </= 20/35  to demo more optimal urinary patterns.    Baseline 28/35    Time 8    Period Weeks    Status New    Target Date 10/13/19      PT LONG TERM GOAL #3   Title Pt will demo ability to relax/bulge PF for improved urinary and bowel voiding/defecation.    Baseline dysenergia present, contracts with attempts to bulge    Time 8    Period Weeks    Status New    Target Date 10/13/19      PT LONG TERM GOAL #4   Title Pt will be educated on bladder irritants, voiding schedule, and toileting techniques to improve behavioral influences on urinary and bowel patterns.    Baseline no knowledge    Time 8    Period Weeks    Status New    Target Date 10/13/19      PT LONG TERM GOAL #5   Title Pt will report at least 50% improvement in urgency/frequency over 24 hour period to improve sleep and reduce daily interruptions for toileting.    Baseline voids 3x/hour during day, rises to void 5-7x/nightly    Time 8    Period Weeks    Status New    Target Date 10/13/19                  Plan - 08/18/19 1117    Clinical Impression Statement Pt is a pleasant male referred to PT with history of urgency, frequency, nocturia, slow stream, hesitancy, and a need to push to void x 3 years.  Pt urinates up to 3x/hour during the day and has to push to void.  He urinates 5-7x/night with difficutly voiding.  PMH includes BPH, ongoing hemmorhoids, bil hernia repair.  Pt is not currently taking any meds for pelvic floor symptoms.  International Prostate Symptoms Score (IPSS) is 28/35, suggesting he is severely symptomatic.  Pt presents with limited lumbar spinal and fascial mobility, LE flexibility restrictions of adductors, piriformis, and hamstrings Lt>Rt, limited bil hip P/ROM ER, and poor balance in SLS bil.  Pt has signif tension and tenderness throughout pelvic floor as assessed externally and internally via anorectal canal.  He has limited excursion of pelvic floor for contract/relax/bulge.  Dysenergia of PF is  present with most difficulty bulging.  Pt is on disability.  PT provided a list of bladder irritants and intiated HEP for LE stretching and diaphragmatic breathing.  Pt will benefit from skilled PT to address mobility, flexibilty and coordination restrictions of trunk, hips, pelvis and bil LEs to see if this improves his urinary symptoms.    Personal Factors and Comorbidities Social Background;Transportation;Time since onset of injury/illness/exacerbation    Examination-Activity Limitations Toileting;Sleep    Examination-Participation Restrictions Community Activity;Shop    Stability/Clinical Decision Making Evolving/Moderate complexity    Clinical Decision Making Moderate    Rehab Potential Good    PT Frequency 1x / week    PT Duration 8 weeks    PT Treatment/Interventions ADLs/Self Care Home Management;Biofeedback;Electrical Stimulation;Moist Heat;Cryotherapy;Traction;Functional mobility training;Therapeutic exercise;Balance training;Neuromuscular re-education;Manual techniques;Patient/family education;Passive range  of motion;Dry needling;Spinal Manipulations;Joint Manipulations    PT Next Visit Plan bike, f/u on HEP, cat/cow, prayer, child's pose, piriformis/ham stretch, work on diaphragmatic breathing and bulge in SL, toileting technique with bulge once breathing improves    PT Home Exercise Plan Access Code: [email or web address], bladder irritants, supine butterfly stretch, SL diaphragmatic breathing    Consulted and Agree with Plan of Care Patient           Patient will benefit from skilled therapeutic intervention in order to improve the following deficits and impairments:  Decreased coordination, Decreased range of motion, Increased fascial restricitons, Increased muscle spasms, Pain, Impaired flexibility, Hypomobility, Decreased balance, Decreased strength  Visit Diagnosis: Other lack of coordination - Plan: PT plan of care cert/re-cert  Cramp and spasm - Plan: PT plan of care  cert/re-cert     Problem List Patient Active Problem List   Diagnosis Date Noted  . External hemorrhoid 07/30/2019  . Acute bilateral low back pain without sciatica 07/30/2019  . Overactive bladder 06/11/2019  . OSA on CPAP 03/24/2019  . Essential hypertension 03/24/2019  . Anxiety and depression 03/24/2019  . Seizure disorder (HCC) 03/24/2019    Morton Peters, PT 08/18/19 11:53 AM   [facility] [address] [place], [place], [address] Phone: [phone]   Fax:  [phone]  Name: Jeremiah Hunter MRN: [id number] Date of Birth: 1961/01/18

## 2019-08-20 ENCOUNTER — Emergency Department (HOSPITAL_COMMUNITY)
Admission: EM | Admit: 2019-08-20 | Discharge: 2019-08-21 | Disposition: A | Discharge status: Home / Self Care | Payer: Medicaid Other | Attending: Emergency Medicine | Admitting: Emergency Medicine

## 2019-08-20 ENCOUNTER — Encounter (HOSPITAL_COMMUNITY): Payer: Self-pay

## 2019-08-20 ENCOUNTER — Emergency Department (HOSPITAL_COMMUNITY): Payer: Medicaid Other

## 2019-08-20 DIAGNOSIS — M5136 Other intervertebral disc degeneration, lumbar region: Secondary | ICD-10-CM

## 2019-08-20 DIAGNOSIS — I10 Essential (primary) hypertension: Secondary | ICD-10-CM | POA: Diagnosis not present

## 2019-08-20 DIAGNOSIS — Z7952 Long term (current) use of systemic steroids: Secondary | ICD-10-CM | POA: Insufficient documentation

## 2019-08-20 DIAGNOSIS — Z79899 Other long term (current) drug therapy: Secondary | ICD-10-CM | POA: Diagnosis not present

## 2019-08-20 DIAGNOSIS — R202 Paresthesia of skin: Secondary | ICD-10-CM | POA: Diagnosis present

## 2019-08-20 LAB — COMPREHENSIVE METABOLIC PANEL
ALT: 26 U/L (ref 0–44)
AST: 27 U/L (ref 15–41)
Albumin: 3.9 g/dL (ref 3.5–5.0)
Alkaline Phosphatase: 82 U/L (ref 38–126)
Anion gap: 10 (ref 5–15)
BUN: 14 mg/dL (ref 6–20)
CO2: 26 mmol/L (ref 22–32)
Calcium: 9.4 mg/dL (ref 8.9–10.3)
Chloride: 104 mmol/L (ref 98–111)
Creatinine, Ser: 1.16 mg/dL (ref 0.61–1.24)
GFR calc Af Amer: >60 mL/min (ref >60)
GFR calc non Af Amer: >60 mL/min (ref >60)
Glucose, Bld: 113 mg/dL — ABNORMAL HIGH (ref 70–99)
Potassium: 3.8 mmol/L (ref 3.5–5.1)
Sodium: 140 mmol/L (ref 135–145)
Total Bilirubin: 0.3 mg/dL (ref 0.3–1.2)
Total Protein: 7.1 g/dL (ref 6.5–8.1)

## 2019-08-20 LAB — CBC
HCT: 41 % (ref 39.0–52.0)
Hemoglobin: 13.4 g/dL (ref 13.0–17.0)
MCH: 28.8 pg (ref 26.0–34.0)
MCHC: 32.7 g/dL (ref 30.0–36.0)
MCV: 88 fL (ref 80.0–100.0)
Platelets: 280 10*3/uL (ref 150–400)
RBC: 4.66 MIL/uL (ref 4.22–5.81)
RDW: 14.7 % (ref 11.5–15.5)
WBC: 4.8 10*3/uL (ref 4.0–10.5)
nRBC: 0 % (ref 0.0–0.2)

## 2019-08-20 MED ORDER — SODIUM CHLORIDE 0.9% FLUSH
3.0000 mL | Freq: Once | INTRAVENOUS | Status: DC
Start: 2019-08-20 — End: 2019-08-21

## 2019-08-20 NOTE — ED Triage Notes (Addendum)
Patient presents to the ED with c/o L leg numbness that started on Saturday +pruritis. Patient appears anxious in triage. Patient denies weakness, headache, CP, SOB. Neuro intact and AAOx3.

## 2019-08-21 ENCOUNTER — Ambulatory Visit: Payer: Self-pay | Admitting: *Deleted

## 2019-08-21 ENCOUNTER — Emergency Department (HOSPITAL_COMMUNITY): Payer: Medicaid Other

## 2019-08-21 ENCOUNTER — Encounter (HOSPITAL_COMMUNITY): Payer: Self-pay | Admitting: Emergency Medicine

## 2019-08-21 MED ORDER — GABAPENTIN 400 MG PO CAPS: 800.0000 mg | ORAL_CAPSULE | Freq: Three times a day (TID) | ORAL | 0 refills | Status: DC

## 2019-08-21 NOTE — Telephone Encounter (Signed)
Caller wants clarification of how much gabapentin can he take at one time? ED encounter yesterday changed dose to take two 400mg  tabs po 3 times a day. two 400mg  tabs = 800mg  can be taken 3 times a day as ordered. Care advise given . patient verbalized understanding of care advise and how much medication can be taken at one time.  Reason for Disposition . Caller has medicine question only, adult not sick, AND triager answers question  Answer Assessment - Initial Assessment Questions 1. NAME of MEDICATION: "What medicine are you calling about?"     gabopentin 2. QUESTION: "What is your question?" (e.g., medication refill, side effect)     How much gabapentin can I take at one time? 3. PRESCRIBING HCP: "Who prescribed it?" Reason: if prescribed by specialist, call should be referred to that group.     Dr. April Palumbo 4. SYMPTOMS: "Do you have any symptoms?"     no 5. SEVERITY: If symptoms are present, ask "Are they mild, moderate or severe?"    no  Protocols used: MEDICATION QUESTION CALL-A-AH

## 2019-08-21 NOTE — ED Provider Notes (Signed)
[facility] EMERGENCY DEPARTMENT Provider Note   CSN: [id number] Arrival date & time: 08/20/19  1704     History Chief Complaint  Patient presents with  . Numbness    Jeremiah Hunter is a 59 y.o. male.  The history is provided by the patient.  Illness Location:  Left leg Quality:  Tingling patchy lateral to the knee and lateral left buttock Severity:  Mild Onset quality:  Gradual Duration:  7 weeks Timing:  Constant Progression:  Partially resolved Chronicity:  New Context:  Reports shingles vaccine 7/22 Relieved by:  Nothing Worsened by:  Nothing  Ineffective treatments:  None tried  Associated symptoms: no abdominal pain, no chest pain, no congestion, no fever, no headaches, no loss of consciousness, no rash, no shortness of breath and no vomiting   Risk factors:  None Patient presents for numbness sensation in LLE (not entirety 2 spots) since having the shingles vaccine. Also has itching without rash and PMD prescribed atarax but patient has not filled it.  No weakness. No changes in vision or speech.  No gait abnormality.  No back pain.  No bowel or bladder issues.       Past Medical History:  Diagnosis Date  . Enlarged prostate   . Hypertension   . Severe episode of recurrent major depressive disorder, with psychotic features (HCC) 03/24/2019    Patient Active Problem List   Diagnosis Date Noted  . External hemorrhoid 07/30/2019  . Acute bilateral low back pain without sciatica 07/30/2019  . Overactive bladder 06/11/2019  . OSA on CPAP 03/24/2019  . Essential hypertension 03/24/2019  . Anxiety and depression 03/24/2019  . Seizure disorder (HCC) 03/24/2019    Past Surgical History:  Procedure Laterality Date  . CYSTECTOMY    . HERNIA REPAIR         Family History  Family history unknown: Yes    Social History   Tobacco Use  . Smoking status: Never Smoker  . Smokeless tobacco: Never Used  Vaping Use  . Vaping Use: Never used    Substance Use Topics  . Alcohol use: Never  . Drug use: Never    Home Medications Prior to Admission medications   Medication Sig Start Date End Date Taking? Authorizing Provider  aspirin 81 MG chewable tablet Chew 1 tablet (81 mg total) by mouth daily. 08/02/19 09/01/19  Derwood Kaplan, MD  cholecalciferol (VITAMIN D3) 25 MCG (1000 UNIT) tablet Take 2,000 Units by mouth daily.    [provider]  diclofenac Sodium (VOLTAREN) 1 % GEL Apply 2 g topically 4 (four) times daily. 07/30/19   Storm Frisk, MD  divalproex (DEPAKOTE ER) 500 MG 24 hr tablet Take by mouth. 08/10/19   [provider]  divalproex (DEPAKOTE) 500 MG DR tablet Take 1 tablet (500 mg total) by mouth 2 (two) times daily. 07/30/19   Storm Frisk, MD  doxepin (SINEQUAN) 150 MG capsule Take 150 mg by mouth at bedtime. 07/31/19   [provider]  doxepin (SINEQUAN) 50 MG capsule Take 2 capsules (100 mg total) by mouth at bedtime. Patient taking differently: Take 50-150 mg by mouth at bedtime.  07/31/19   Storm Frisk, MD  gabapentin (NEURONTIN) 300 MG capsule Take 2 capsules (600 mg total) by mouth 3 (three) times daily. 07/30/19 08/29/19  Storm Frisk, MD  hydrochlorothiazide (HYDRODIURIL) 25 MG tablet Take 1 tablet (25 mg total) by mouth daily. Patient not taking: Reported on 08/02/2019 06/11/19   Shan Levans  E, MD  hydrOXYzine (ATARAX/VISTARIL) 10 MG tablet Take 1 tablet (10 mg total) by mouth 3 (three) times daily as needed. 08/18/19   Mayers, Cari S, PA-C  loratadine (CLARITIN) 10 MG tablet Take 1 tablet (10 mg total) by mouth daily. 06/11/19   Storm FriskWright, Patrick E, MD  metoprolol succinate (TOPROL-XL) 25 MG 24 hr tablet Take 1 tablet (25 mg total) by mouth daily. 06/11/19   Storm FriskWright, Patrick E, MD  oxybutynin (DITROPAN) 5 MG tablet Take 1 tablet (5 mg total) by mouth 3 (three) times daily. 07/31/19   Storm FriskWright, Patrick E, MD    Allergies    Losartan  Review of Systems   Review of Systems   Constitutional: Negative for fever.  HENT: Negative for congestion.   Eyes: Negative for visual disturbance.  Respiratory: Negative for shortness of breath.   Cardiovascular: Negative for chest pain.  Gastrointestinal: Negative for abdominal pain and vomiting.  Genitourinary: Negative for difficulty urinating.  Musculoskeletal: Negative for arthralgias.  Skin: Negative for rash.  Neurological: Positive for numbness. Negative for loss of consciousness, facial asymmetry, speech difficulty, weakness and headaches.  Psychiatric/Behavioral: Negative for agitation.  All other systems reviewed and are negative.   Physical Exam Updated Vital Signs BP 117/76 (BP Location: Left Arm)   Pulse 70   Temp 98.5 F (36.9 C)   Resp 17   Ht 5\' 11"  (1.803 m)   Wt (!) 106.1 kg   SpO2 98%   BMI 32.64 kg/m   Physical Exam Vitals and nursing note reviewed.  Constitutional:      Appearance: Normal appearance.  HENT:     Head: Normocephalic and atraumatic.     Nose: Nose normal.     Mouth/Throat:     Mouth: Mucous membranes are moist.     Pharynx: Oropharynx is clear.  Eyes:     Extraocular Movements: Extraocular movements intact.     Conjunctiva/sclera: Conjunctivae normal.     Pupils: Pupils are equal, round, and reactive to light.  Cardiovascular:     Rate and Rhythm: Normal rate and regular rhythm.     Pulses: Normal pulses.     Heart sounds: Normal heart sounds.  Pulmonary:     Effort: Pulmonary effort is normal.     Breath sounds: Normal breath sounds.  Abdominal:     General: Abdomen is flat. Bowel sounds are normal.     Tenderness: There is no abdominal tenderness. There is no guarding or rebound.  Musculoskeletal:        General: Normal range of motion.     Cervical back: Normal range of motion and neck supple.  Skin:    General: Skin is warm and dry.     Capillary Refill: Capillary refill takes less than 2 seconds.  Neurological:     General: No focal deficit present.      Mental Status: He is alert and oriented to person, place, and time.     Cranial Nerves: No cranial nerve deficit.     Sensory: No sensory deficit.     Motor: No weakness.     Gait: Gait normal.     Deep Tendon Reflexes: Reflexes normal.     Comments: Sensory intact to confrontation to all nerve distributions of the LLE   Psychiatric:        Mood and Affect: Mood is anxious.     ED Results / Procedures / Treatments   Labs (all labs ordered are listed, but only abnormal results are displayed) Labs  Reviewed  COMPREHENSIVE METABOLIC PANEL - Abnormal; Notable for the following components:      Result Value   Glucose, Bld 113 (*)    All other components within normal limits  CBC  CBG MONITORING, ED    EKG EKG Interpretation  Date/Time:  Thursday August 20 2019 17:30:44 EDT Ventricular Rate:  80 PR Interval:  146 QRS Duration: 84 QT Interval:  364 QTC Calculation: 419 R Axis:   -16 Text Interpretation: Normal sinus rhythm Confirmed by Nicanor Alcon, Everlean Bucher ([id number]) on 08/21/2019 5:20:20 AM   Radiology CT HEAD WO CONTRAST  Result Date: 08/20/2019 CLINICAL DATA:  Neuro deficit, acute, stroke suspected. Additional provided: Patient reports left leg numbness since Saturday. EXAM: CT HEAD WITHOUT CONTRAST TECHNIQUE: Contiguous axial images were obtained from the base of the skull through the vertex without intravenous contrast. COMPARISON:  Head CT 05/15/2019. FINDINGS: Brain: Cerebral volume is normal for age. Prominent perivascular space versus minimal chronic small vessel ischemic change within the right temporoparietal periventricular white matter (series 3, image 20). There is no acute intracranial hemorrhage. No demarcated cortical infarct. No extra-axial fluid collection. No evidence of intracranial mass. No midline shift. Vascular: No hyperdense vessel. Skull: Normal. Negative for fracture or focal lesion. Sinuses/Orbits: Visualized orbits show no acute finding. Chronic medially displaced  deformity of the left lamina papyracea. Mild ethmoid sinus mucosal thickening. No significant mastoid effusion. IMPRESSION: No CT evidence of acute intracranial abnormality. Redemonstrated prominent perivascular space versus minimal chronic small-vessel ischemic change within the right temporoparietal periventricular white matter. Chronic medially displaced deformity of the left lamina papyracea. Mild ethmoid sinus mucosal thickening. Electronically Signed   By: Jackey Loge DO   On: 08/20/2019 18:56    Procedures Procedures (including critical care time)  Medications Ordered in ED Medications  sodium chloride flush (NS) 0.9 % injection 3 mL (has no administration in time range)    ED Course  I have reviewed the triage vital signs and the nursing notes.  Pertinent labs & imaging results that were available during my care of the patient were reviewed by me and considered in my medical decision making (see chart for details).    530: case d/w Dr. Wilford Corner.  Not consistent with guillain barre.  There is no indication for MRI.  Can increase neruontin and refer back to pcp.     Given back issues will obtain lumbar spine film to exclude pathology.    Negative for acute pathology.    Paresthesias. Will increase neurontin to 800 mg TID anmd refer back to PMD for ongoing care.  Jeremiah Hunter was evaluated in Emergency Department on 08/21/2019 for the symptoms described in the history of present illness. He was evaluated in the context of the global COVID-19 pandemic, which necessitated consideration that the patient might be at risk for infection with the SARS-CoV-2 virus that causes COVID-19. Institutional protocols and algorithms that pertain to the evaluation of patients at risk for COVID-19 are in a state of rapid change based on information released by regulatory bodies including the CDC and federal and state organizations. These policies and algorithms were followed during the patient's care in the  ED.  Final Clinical Impression(s) / ED Diagnoses  Return for intractable cough, coughing up blood,fevers >100.4 unrelieved by medication, shortness of breath, intractable vomiting, chest pain, shortness of breath, weakness,numbness, changes in speech, facial asymmetry,abdominal pain, passing out,Inability to tolerate liquids or food, cough, altered mental status or any concerns. No signs of systemic illness or infection. The patient is  nontoxic-appearing on exam and vital signs are within normal limits.   I have reviewed the triage vital signs and the nursing notes. Pertinent labs &imaging results that were available during my care of the patient were reviewed by me and considered in my medical decision making (see chart for details).After history, exam, and medical workup I feel the patient has beenappropriately medically screened and is safe for discharge home. Pertinent diagnoses were discussed with the patient. Patient was given return precautions.       Fleeta Kunde, MD 08/21/19 [phone]

## 2019-08-24 ENCOUNTER — Telehealth: Payer: Self-pay | Admitting: Critical Care Medicine

## 2019-08-24 NOTE — Telephone Encounter (Signed)
I connected with this patient and advised him to not increase his Neurontin but instead stay the 4 mg 3 times daily.  He had stopped his doxepin which has been helping with the itching and the paresthesias I asked him to resume Lyrica 50 mg nightly dose.  He had been on 150 mg was causing him to oversleep.  He has an upcoming referral to neurology he says he will keep that appointment.  I advised him to come see me in the mobile clinic this week and avoid the emergency room he said he would do so.

## 2019-08-24 NOTE — Telephone Encounter (Signed)
Dr. Delford Field may you please follow up

## 2019-08-25 ENCOUNTER — Ambulatory Visit: Payer: Medicaid Other | Attending: Adult Health | Admitting: Physical Therapy

## 2019-08-25 ENCOUNTER — Encounter: Payer: Self-pay | Admitting: Physical Therapy

## 2019-08-25 ENCOUNTER — Other Ambulatory Visit: Payer: Self-pay

## 2019-08-25 DIAGNOSIS — R278 Other lack of coordination: Secondary | ICD-10-CM | POA: Diagnosis present

## 2019-08-25 DIAGNOSIS — R252 Cramp and spasm: Secondary | ICD-10-CM | POA: Diagnosis present

## 2019-08-25 NOTE — Patient Instructions (Signed)
Access Code: [email or web address] URL: [email or web address]: 08/03/2021Prepared by: Loistine Simas BeuhringExercises  Supine Butterfly Groin Stretch - 2 x daily - 7 x weekly - 1 sets - 2 reps - 60 hold  Sidelying Diaphragmatic Breathing - 2 x daily - 7 x weekly - 1 sets - 60 reps - 3 hold  Cat-Camel - 1 x daily - 7 x weekly - 1 sets - 10 reps - 3 hold  Cat-Camel to Child's Pose - 1 x daily - 7 x weekly - 1 sets - 3 reps - 10 hold  Hooklying Single Knee to Chest Stretch - 1 x daily - 7 x weekly - 1 sets - 3 reps - 10 hold  Supine Lower Trunk Rotation - 1 x daily - 7 x weekly - 1 sets - 3 reps - 10 hold  Supine Double Knee to Chest - 1 x daily - 7 x weekly - 1 sets - 3 reps - 10 hold  Sidelying Thoracic Rotation with Open Book - 1 x daily - 7 x weekly - 1 sets - 10 reps

## 2019-08-25 NOTE — Therapy (Addendum)
[facility] [address] [place], [place], [address] Phone: [phone]   Fax:  [phone]  Physical Therapy Treatment  Patient Details  Name: Jeremiah Hunter MRN: [id number] Date of Birth: [birth year] Referring Provider (PT): Hollace Hayward, NP   Encounter Date: 08/25/2019   PT End of Session - 08/25/19 1444    Visit Number 2    Date for PT Re-Evaluation 10/13/19    Authorization Type Medicaid: 3 visits between 8/3 and 8/23    Authorization - Visit Number 1    Authorization - Number of Visits 3    PT Start Time 6546    PT Stop Time 1528    PT Time Calculation (min) 43 min    Activity Tolerance Patient tolerated treatment well    Behavior During Therapy [facility] for tasks assessed/performed           Past Medical History:  Diagnosis Date  . Enlarged prostate   . Hypertension   . Severe episode of recurrent major depressive disorder, with psychotic features ([place]) 03/24/2019    Past Surgical History:  Procedure Laterality Date  . CYSTECTOMY    . HERNIA REPAIR      There were no vitals filed for this visit.   Subjective Assessment - 08/25/19 1445    Subjective The butterfly stretch hurts my Lt leg sometimes.  I'm going to see a neurologist about it.    Pertinent History BPH, hernia repair, upcoming hemmorhoid surgery, intermittent back pain    Currently in Pain? No/denies                             [facility] Adult PT Treatment/Exercise - 08/25/19 0001      Self-Care   Self-Care Other Self-Care Comments    Other Self-Care Comments  timed toileting - limit urinations to 1x/hour goal      Neuro Re-ed    Neuro Re-ed Details  diaphragmatic breathing in SL with TCs on abdomen by PT, VCs for awareness of PF release with inhale      Exercises   Exercises Lumbar;Knee/Hip      Lumbar Exercises: Stretches   Single Knee to Chest Stretch Right;Left;3 reps;10 seconds    Double Knee to Chest Stretch 2  reps;10 seconds    Lower Trunk Rotation 3 reps;10 seconds    Piriformis Stretch 1 rep;30 seconds;Right;Left    Other Lumbar Stretch Exercise open books Rt, Lt x 10 each side      Lumbar Exercises: Aerobic   Recumbent Bike L2 x 5', PT present to discuss initial HEP and plan for today's session      Manual Therapy   Manual Therapy Joint mobilization;Soft tissue mobilization    Joint Mobilization T/L jxn PAs, L1/2 PA Gr II/III    Soft tissue mobilization lumbar paraspinal broadening and stripping bil                  PT Education - 08/25/19 1509    Education Details Access Code: [email or web address]    Person(s) Educated Patient    Methods Explanation;Handout;Demonstration;Tactile cues;Verbal cues    Comprehension Verbalized understanding;Returned demonstration;Verbal cues required;Tactile cues required            PT Short Term Goals - 08/18/19 1133      PT SHORT TERM GOAL #1   Title Pt will be ind with initial HEP for trunk and LE flexibility and mobility.    Time 3  Period Weeks    Status New    Target Date 09/08/19      PT SHORT TERM GOAL #2   Title Pt will report reduced frequency of urination to 1-2/hour    Baseline up to 3x/hour    Time 4    Period Weeks    Status New    Target Date 09/15/19             PT Long Term Goals - 08/18/19 1134      PT LONG TERM GOAL #1   Title Pt will be ind with HEP for improved trunk, hip and LE flexibility and mobility to improve overall functional mobility.    Baseline no knowledge    Time 8    Period Weeks    Status New    Target Date 10/13/19      PT LONG TERM GOAL #2   Title Pt will reduce IPSS score to </= 20/35 to demo more optimal urinary patterns.    Baseline 28/35    Time 8    Period Weeks    Status New    Target Date 10/13/19      PT LONG TERM GOAL #3   Title Pt will demo ability to relax/bulge PF for improved urinary and bowel voiding/defecation.    Baseline dysenergia present, contracts with attempts to  bulge    Time 8    Period Weeks    Status New    Target Date 10/13/19      PT LONG TERM GOAL #4   Title Pt will be educated on bladder irritants, voiding schedule, and toileting techniques to improve behavioral influences on urinary and bowel patterns.    Baseline no knowledge    Time 8    Period Weeks    Status New    Target Date 10/13/19      PT LONG TERM GOAL #5   Title Pt will report at least 50% improvement in urgency/frequency over 24 hour period to improve sleep and reduce daily interruptions for toileting.    Baseline voids 3x/hour during day, rises to void 5-7x/nightly    Time 8    Period Weeks    Status New    Target Date 10/13/19                 Plan - 08/25/19 1530    Clinical Impression Statement Pt returns for first follow up since initial eval.  He is compliant with HEP.  Pt progressed PF stretching and spine ROM for HEP today with good tolerance. Pt needed signif amount of TC/VC for proper performance of quadruped ther ex.  PT introduced timed toileting with goal of 1x/hour for urination frequency.  PT performed thoracolumbar junction mobs and lumbar myofascial/STM to see if this helps symptoms given connection of T/L junction with bladder.  Pt with good demo of diaphragmatic breathing ability but has trouble making connection to PF drop/relaxation.  PT to work on this with internal cueing next visit.  Continue along POC.    Rehab Potential Good    PT Frequency 1x / week    PT Duration 8 weeks    PT Treatment/Interventions ADLs/Self Care Home Management;Biofeedback;Electrical Stimulation;Moist Heat;Cryotherapy;Traction;Functional mobility training;Therapeutic exercise;Balance training;Neuromuscular re-education;Manual techniques;Patient/family education;Passive range of motion;Dry needling;Spinal Manipulations;Joint Manipulations    PT Next Visit Plan bike, f/u on HEP,work on diaphragmatic breathing and bulge in SL (internal cueing if Pt comfortable), toileting  technique with bulge once breathing improves    PT Home Exercise Plan  Access Code: [email or web address], bladder irritants, supine butterfly stretch, SL diaphragmatic breathing, timed toileting f/u (1x/hour goal)    Consulted and Agree with Plan of Care Patient           Patient will benefit from skilled therapeutic intervention in order to improve the following deficits and impairments:     Visit Diagnosis: Other lack of coordination  Cramp and spasm     Problem List Patient Active Problem List   Diagnosis Date Noted  . External hemorrhoid 07/30/2019  . Acute bilateral low back pain without sciatica 07/30/2019  . Overactive bladder 06/11/2019  . OSA on CPAP 03/24/2019  . Essential hypertension 03/24/2019  . Anxiety and depression 03/24/2019  . Seizure disorder ([place]) 03/24/2019     , PT 08/25/19 3:35 PM  PHYSICAL THERAPY DISCHARGE SUMMARY  Visits from Start of Care: 2  Current functional level related to goals / functional outcomes: Pt called and cancelled remaining appointments, reporting per MD request. No goals met, HEP initiated.   Remaining deficits: See above   Education / Equipment: HEP initiated Plan: Patient agrees to discharge.  Patient goals were not met. Patient is being discharged due to the physician's request.  ?????      Baruch Merl, PT 08/29/19 9:32 AM   [facility] [address], [place], [place], [address] Phone: [phone]   Fax:  [phone]  Name: Donovyn Guidice MRN: [id number] Date of Birth: 01-07-1961

## 2019-08-26 ENCOUNTER — Ambulatory Visit: Payer: Medicaid Other | Admitting: Critical Care Medicine

## 2019-08-26 ENCOUNTER — Inpatient Hospital Stay: Payer: Medicaid Other | Admitting: Physician Assistant

## 2019-08-26 ENCOUNTER — Ambulatory Visit: Payer: Medicaid Other | Admitting: Podiatry

## 2019-09-01 ENCOUNTER — Encounter: Payer: Medicaid Other | Admitting: Physical Therapy

## 2019-09-01 ENCOUNTER — Telehealth: Payer: Self-pay | Admitting: Critical Care Medicine

## 2019-09-01 NOTE — Telephone Encounter (Signed)
Please advise.   Copied from CRM [id number]. Topic: General - Other >> Sep 01, 2019  8:51 AM Jeremiah Hunter wrote: Patient was seen in the ED for leg pain / numbness and hospital referred patient to neuro, neuro unable to see patient until 10/22/2019. Patient states he can not wait that long and would like PCP to refer to another neuro who can possible see patient sooner,  Please advise

## 2019-09-03 ENCOUNTER — Ambulatory Visit: Payer: Self-pay | Admitting: *Deleted

## 2019-09-03 NOTE — Telephone Encounter (Signed)
  Patient is calling to report he has numbness in the left thigh area of his leg. He was seen at the ED and given a neurology referral- but it is not until the end of September. Patient states he doesn't know if he can wait that long with the burning and numbness in his leg. Patient states he has fallen a couple times recently and thinks the numbness in the leg was the cause. Call to office to see if patient can be seen - they are going to schedule him a hospital follow up visit. Reason for Disposition . [1] Numbness (i.e., loss of sensation) of the face, arm / hand, or leg / foot on one side of the body AND [2] gradual onset (e.g., days to weeks) AND [3] present now    Patient was seen at ED recently- he has referral to neurology- patient states he needs some help with symptoms before that appointment. Call to office for appointment.  Answer Assessment - Initial Assessment Questions 1. SYMPTOM: "What is the main symptom you are concerned about?" (e.g., weakness, numbness)     2 weeks ago- numbness and burning in leg-left- thigh area 2. ONSET: "When did this start?" (minutes, hours, days; while sleeping)     2 weeks ago 3. LAST NORMAL: "When was the last time you were normal (no symptoms)?"     Over 2 weeks ago 4. PATTERN "Does this come and go, or has it been constant since it started?"  "Is it present now?"     Constant-yes, itching on leg 5. CARDIAC SYMPTOMS: "Have you had any of the following symptoms: chest pain, difficulty breathing, palpitations?"     no 6. NEUROLOGIC SYMPTOMS: "Have you had any of the following symptoms: headache, dizziness, vision loss, double vision, changes in speech, unsteady on your feet?"     no 7. OTHER SYMPTOMS: "Do you have any other symptoms?"     Patient has had a couple of falls due to numbness in the leg 8. PREGNANCY: "Is there any chance you are pregnant?" "When was your last menstrual period?"     n/a  Protocols used: NEUROLOGIC DEFICIT-A-AH

## 2019-09-03 NOTE — Telephone Encounter (Signed)
I contacted [facility]  and they got a sooner appointment  8/20 @ 8:45am and patient is aware  .

## 2019-09-07 ENCOUNTER — Encounter: Payer: Medicaid Other | Admitting: Physical Therapy

## 2019-09-07 ENCOUNTER — Telehealth: Payer: Self-pay | Admitting: Critical Care Medicine

## 2019-09-07 NOTE — Telephone Encounter (Signed)
Pt is calling back and would like dr Delford Field to call him personally

## 2019-09-07 NOTE — Telephone Encounter (Signed)
Patient is calling to ask advice from Dr. Delford Field. Patient is have leg numbness patient is wanting a referral to orthopedic doctor instead of a neurologist.  Patient is wanting to discuss his medication prescribed to him by [facility] before he picks it up from the pharmacy. Checked for appts no appt available with Dr. Delford Field until 10/08/19. Please advise Cb- [phone]

## 2019-09-08 NOTE — Telephone Encounter (Signed)
Forwarded to provider as requested.

## 2019-09-08 NOTE — Telephone Encounter (Signed)
I am out of the office today. I will call him tomorrow

## 2019-09-11 ENCOUNTER — Ambulatory Visit (INDEPENDENT_AMBULATORY_CARE_PROVIDER_SITE_OTHER): Payer: Medicaid Other | Admitting: Neurology

## 2019-09-11 ENCOUNTER — Encounter: Payer: Self-pay | Admitting: Neurology

## 2019-09-11 VITALS — BP 119/83 | HR 75 | Ht 71.0 in | Wt 246.0 lb

## 2019-09-11 DIAGNOSIS — G5712 Meralgia paresthetica, left lower limb: Secondary | ICD-10-CM | POA: Diagnosis not present

## 2019-09-11 DIAGNOSIS — F3132 Bipolar disorder, current episode depressed, moderate: Secondary | ICD-10-CM | POA: Diagnosis not present

## 2019-09-11 MED ORDER — EQL STRESS B-COMPLEX C/ZINC PO TABS: ORAL_TABLET | ORAL | Status: DC

## 2019-09-11 MED ORDER — LIDOCAINE-PRILOCAINE 2.5-2.5 % EX CREA
1.0000 | TOPICAL_CREAM | CUTANEOUS | 0 refills | Status: DC | PRN
Start: 2019-09-11 — End: 2019-09-14

## 2019-09-11 NOTE — Patient Instructions (Signed)
meralgia paresthetica   Topical EMLA cream or lidocaine cream. I discussed meralgia paresthetica and the risk factors with him.  EMG and NCV ordered.   Avoid tight clothing that cuts at the groin.  Add a vitamin B complex OTC.   Gabapentin was taken already before for foot pain and has not changed the outcome but should be continued. Marland Kitchen

## 2019-09-11 NOTE — Progress Notes (Signed)
Provider:  Melvyn Novas, M D  Referring Provider: Storm Frisk, MD Primary Care Physician:  Storm Frisk, MD  Chief Complaint  Patient presents with  . Paresthesia, DDD    rm 10 ED referral    HPI:  09-11-2019;  Jeremiah Hunter is a 59 y.o. male Patient of African-American ancestry and seen here upon referralrom Dr. Cy Blamer- ED. His PCP is listed as Dr. Delford Field.   He went to walk- in -clinic [facility] on [address] and received a shot which did not make a difference. This was  last weekend for left leg numbness and burning, affecting the left thigh front and lateral, from hip to to knee , a patch covering the femoral cutaneus nerve.   He says the problem was the same as on 7-29, when he went to ED at [facility]. Mr. Casey Fye had reported that he started having numbness in his left thigh 4 days prior to his visit at the emergency room but he also saw a primary care provider on 08/18/2019 where he had a similar history given.  He denied any back pain he does not have joint pain per se knee and hip have normal range of motion.  He had no trauma that he knows of.  The numbness is improving but he had intense itching at the time no rash was seen.  The only thing that may have played a role was that he got a shingles vaccine on 08/16/2019 in his right arm but that would not affect his left thigh.  His past medical history is positive for a benign prostate hyperplasia, essential hypertension, and he had recurrent major depression in the past sometimes with psychotic features last evaluated on 24 March 2019 he also has undergone a cystectomy and hernia repair.  The patient is single he is currently not gainfully employed.  He has never been a smoker he does not use alcohol he does not use any caffeine he drinks some sodas but not caffeinated ones.  His likes decaff green  tea . Not a coffee drinker either. I reviewed his current medications include vitamin D, see lieted meds.  He couldn't afford  The listed voltaren cream, he stated. He takes Doxepine, gabapentin, depakote.   He is not DM< no Thyroid dysfunction,      Review of Systems: Out of a complete 14 system review, the patient complains of only the following symptoms, and all other reviewed systems are negative.   Social History   Socioeconomic History  . Marital status: Single    Spouse name: Not on file  . Number of children: Not on file  . Years of education: Not on file  . Highest education level: Not on file  Occupational History  . Not on file  Tobacco Use  . Smoking status: Never Smoker  . Smokeless tobacco: Never Used  Vaping Use  . Vaping Use: Never used  Substance and Sexual Activity  . Alcohol use: Never  . Drug use: Never  . Sexual activity: Not Currently  Other Topics Concern  . Not on file  Social History Narrative  . Not on file   Social Determinants of Health   Financial Resource Strain:   . Difficulty of Paying Living Expenses: Not on file  Food Insecurity:   . Worried About Programme researcher, broadcasting/film/video in the Last Year: Not on file  . Ran Out of Food in the Last Year: Not on file  Transportation Needs:   . Freight forwarder (Medical): Not on file  . Lack of Transportation (Non-Medical): Not on file  Physical Activity:   . Days of Exercise per Week: Not on file  . Minutes of Exercise per Session: Not on file  Stress:   . Feeling of Stress : Not on file  Social Connections:   . Frequency of Communication with Friends and Family: Not on file  . Frequency of Social Gatherings with Friends and Family: Not on file  . Attends Religious Services: Not on file  . Active Member of Clubs or Organizations: Not on file  . Attends Banker Meetings: Not on file  . Marital Status: Not on file  Intimate Partner Violence:   . Fear of Current or Ex-Partner: Not on file  . Emotionally Abused: Not on file  . Physically Abused: Not on file  . Sexually Abused: Not on  file    Family History  Family history unknown: Yes    Past Medical History:  Diagnosis Date  . DDD (degenerative disc disease), lumbar   . Enlarged prostate   . Hypertension   . Severe episode of recurrent major depressive disorder, with psychotic features (HCC) 03/24/2019    Past Surgical History:  Procedure Laterality Date  . CYSTECTOMY    . HERNIA REPAIR      Current Outpatient Medications  Medication Sig Dispense Refill  . cholecalciferol (VITAMIN D3) 25 MCG (1000 UNIT) tablet Take 2,000 Units by mouth daily.    . divalproex (DEPAKOTE ER) 500 MG 24 hr tablet Take by mouth.     . gabapentin (NEURONTIN) 300 MG capsule Take 300 mg by mouth 3 (three) times daily.    . hydrochlorothiazide (HYDRODIURIL) 25 MG tablet Take 1 tablet (25 mg total) by mouth daily. 90 tablet 3  . loratadine (CLARITIN) 10 MG tablet Take 1 tablet (10 mg total) by mouth daily. 30 tablet 4  . metoprolol succinate (TOPROL-XL) 25 MG 24 hr tablet Take 1 tablet (25 mg total) by mouth daily. 90 tablet 3  . oxybutynin (DITROPAN) 5 MG tablet Take 1 tablet (5 mg total) by mouth 3 (three) times daily. 90 tablet 3  . OXYTROL 3.9 MG/24HR 1 patch 2 (two) times a week.    . diclofenac Sodium (VOLTAREN) 1 % GEL Apply 2 g topically 4 (four) times daily. (Patient not taking: Reported on 09/11/2019) 50 g 0  . doxepin (SINEQUAN) 150 MG capsule Take 150 mg by mouth at bedtime. (Patient not taking: Reported on 09/11/2019)    . doxepin (SINEQUAN) 50 MG capsule Take 2 capsules (100 mg total) by mouth at bedtime. (Patient not taking: Reported on 09/11/2019) 60 capsule 2  . hydrOXYzine (ATARAX/VISTARIL) 10 MG tablet Take 1 tablet (10 mg total) by mouth 3 (three) times daily as needed. (Patient not taking: Reported on 09/11/2019) 30 tablet 0  . naproxen (NAPROSYN) 500 MG tablet Take 500 mg by mouth 2 (two) times daily. (Patient not taking: Reported on 09/11/2019)     No current facility-administered medications for this visit.     Allergies as of 09/11/2019 - Review Complete 09/11/2019  Allergen Reaction Noted  . Losartan Itching 04/21/2019    Vitals: BP 119/83   Pulse 75   Ht 5\' 11"  (1.803 m)   Wt 246 lb (111.6 kg)   BMI 34.31 kg/m  Last Weight:  Wt Readings from Last 1 Encounters:  09/11/19 246 lb (111.6 kg)   Last Height:   Ht Readings from  Last 1 Encounters:  09/11/19 5\' 11"  (1.803 m)    Physical exam:  General: The patient is awake, alert and appears not in acute distress. The patient is well groomed. Head: Normocephalic, atraumatic. Neck is supple. Mallampati 3, neck circumference:17.5  Cardiovascular:  Regular rate and rhythm, without  murmurs or carotid bruit, and without distended neck veins. Respiratory: Lungs are clear to auscultation. Skin:  Without evidence of edema, or rash Trunk: BMI is 34 kg/m2.  Neurologic exam : The patient is awake and alert, oriented to place and time.  Memory subjective  described as intact. There is a normal attention span & concentration ability. Speech is fluent without  dysarthria, dysphonia or aphasia. Mood and affect are appropriate, very demure. .  Cranial nerves: Pupils are equal and briskly reactive to light. Funduscopic exam without evidence of pallor or edema. Extraocular movements  in vertical and horizontal planes intact and without nystagmus.  Visual fields by finger perimetry are intact. Hearing to finger rub intact.  Facial sensation intact to fine touch. Facial motor strength is symmetric and tongue and uvula move midline. Tongue protrusion into either cheek is normal. Shoulder shrug is normal.   Motor exam: Normal tone ,muscle bulk and symmetric  strength in all extremities.  Sensory:  Fine touch, pinprick and vibration were tested in all extremities. He reports similar vibration at knee and ankle but less fine touch sensation at the left thigh and left antebrachium. Proprioception was normal.  Coordination: no changes in handwriting. Rapid  alternating movements in the fingers/hands were normal. Finger-to-nose maneuver  normal without evidence of ataxia, dysmetria or tremor.  Gait and station: Patient walks without assistive device and is able unassisted to climb up to the exam table.  Strength within normal limits. Stance is stable and normal. Tandem gait is unfragmented. Romberg testing is negative   Deep tendon reflexes: in the  upper and lower extremities are symmetric and intact. Babinski maneuver response is  downgoing.   Assessment:   39 minutes. After physical and neurologic examination, review of laboratory studies, imaging, neurophysiology testing and pre-existing records, assessment is that of :   Subjective numbness and burning dyesthesias in the left thigh and left arm.   No rash or swelling, no temperature changes in that area.   Plan:  Treatment plan and additional workup :  Topical EMLA cream or lidocaine cream. I discussed meralgia paresthetica and the risk factors with him. EMG and NCV ordered. Avoid tight clothing that cuts at the groin.  Add a vitamin B complex OTC.   Gabapentin was taken already before for foot pain and has not changed the outcome.    Desi Carby MD 09/11/2019

## 2019-09-14 ENCOUNTER — Telehealth: Payer: Self-pay | Admitting: Neurology

## 2019-09-14 MED ORDER — LIDOCAINE-PRILOCAINE 2.5-2.5 % EX CREA: 1.0000 "application " | TOPICAL_CREAM | CUTANEOUS | 5 refills | Status: DC | PRN

## 2019-09-14 NOTE — Telephone Encounter (Signed)
Pt request lidocaine-prilocaine (EMLA) cream at [id number]

## 2019-09-14 NOTE — Addendum Note (Signed)
Addended by: Judi Cong on: 09/14/2019 09:08 AM   Modules accepted: Orders

## 2019-09-14 NOTE — Telephone Encounter (Signed)
Refill sent for the patient.  

## 2019-09-16 ENCOUNTER — Telehealth: Payer: Self-pay | Admitting: Critical Care Medicine

## 2019-09-16 NOTE — Telephone Encounter (Signed)
I connected with this patient and f/u his ED visits   He is better Neuro thinks is from femoral superficial nerve compression    EMG NCV pending   Emla has helped

## 2019-09-22 ENCOUNTER — Ambulatory Visit: Payer: Medicaid Other | Admitting: Physical Therapy

## 2019-09-22 ENCOUNTER — Telehealth: Payer: Self-pay | Admitting: Neurology

## 2019-09-22 NOTE — Telephone Encounter (Signed)
Called the patient to advise that I literally just hit refill on the script that was sent before. I didn't make any changes to the medication dosage or amount.  There was no answer.  If patient calls back please ask him to have pharmacy send a refill request for the 30 day supply if that is what they had on file. And we will approve.

## 2019-09-22 NOTE — Telephone Encounter (Signed)
Pt called stating that the pharmacy informed him that his lidocaine-prilocaine (EMLA) cream was for a 15 day supply and the pt is wanting to know why he did not get the 30 day supply like last time. Pt would like to know if the 30 day can be called in for him instead of 15 days. Please advise.

## 2019-09-23 NOTE — Telephone Encounter (Signed)
Pt returned call and was informed. Pt stated he will call the pharmacy right now for them to send the request. Pt verbalized appreciation

## 2019-09-25 ENCOUNTER — Ambulatory Visit: Payer: Self-pay | Admitting: Surgery

## 2019-09-25 DIAGNOSIS — K642 Third degree hemorrhoids: Secondary | ICD-10-CM

## 2019-09-25 NOTE — H&P (Signed)
Jeremiah Hunter Appointment: 09/25/2019 10:15 AM Location: [facility] Patient #: [id number] DOB: Jun 06, 1960 Single / Language: [name] / Race: Black or African American Male  History of Present Illness Jeremiah Sportsman MD; 09/25/2019 10:36 AM) The patient is a 59 year old male who presents with hemorrhoids. Note for "Hemorrhoids": ` ` ` Patient sent for surgical consultation at the request of Dr Shan Levans  Chief Complaint: Worsening hemorrhoids. ` ` The patient is a pleasant gentleman does struggle with hemorrhoids for many years. Worse especially the past few years. Notes that he will get pain in bleeding and irritation. Can feel something popping out when he moves his bowels. Usually moves his bowels once a day. He occasionally has episodes of multiple bowel movements a few times a month. He's never had a colonoscopy. Not seen a gastroenterologist. He's never had any prior anorectal interventions. No history of Crohn's or colitis. He does struggle with some difficulty walking and he is being worked up for possible left-sided neuropathy by neurology. No cardiac issues. Claims he can walk about 10 minutes with his cane before his hip bothers him. He does not smoke. No history of strokes. Not a diabetic. No cardiovascular issues that he is aware of. Does have sleep apnea.  (Review of systems as stated in this history (HPI) or in the review of systems. Otherwise all other 12 point ROS are negative) ` ` `  This patient encounter took 25 minutes today to perform the following: obtain history, perform exam, review outside records, interpret tests & imaging, counsel the patient on their diagnosis; and, document this encounter, including findings & plan in the electronic health record (EHR).   Past Surgical History ([name], CMA; 09/25/2019 9:53 AM) No pertinent past surgical history  Allergies ([name], CMA; 09/25/2019 9:54 AM) No Known Drug Allergies  [09/25/2019]: Allergies Reconciled  Medication History ([name], CMA; 09/25/2019 9:56 AM) Lidocaine-Prilocaine (External) Specific strength unknown - Active. B Complex (Oral) Specific strength unknown - Active. Gabapentin (300MG  Tablet, Oral) Active. Oxytrol (3.9MG /24HR Patch TW, Transdermal) Active. Divalproex Sodium (Oral) Specific strength unknown - Active. Vitamin D3 (Oral) Specific strength unknown - Active. hydroCHLOROthiazide (Oral) Specific strength unknown - Active. Metoprolol Succinate ER (Oral) Specific strength unknown - Active. Loratadine (10MG  Tablet, Oral) Active. FLUoxetine HCl (Oral) Specific strength unknown - Active. Medications Reconciled  Social History 11-02-2005, CMA; 09/25/2019 9:53 AM) Alcohol use Remotely quit alcohol use. Caffeine use Carbonated beverages, Tea. Illicit drug use Remotely quit drug use. Tobacco use Never smoker.  Family History ([name], CMA; 09/25/2019 9:53 AM) Alcohol Abuse Father. Breast Cancer Sister. Depression Daughter, Sister. Diabetes Mellitus Sister. Heart disease in male family member before age 8 Hypertension Sister. Migraine Headache Daughter. Thyroid problems Sister.  Other Problems (Chanel 11/25/2019, CMA; 09/25/2019 9:53 AM) No pertinent past medical history     Review of Systems ([name] CMA; 09/25/2019 9:53 AM) General Present- Fatigue. Not Present- Appetite Loss, Chills, Fever, Night Sweats, Weight Gain and Weight Loss. Skin Present- Jaundice. Not Present- Change in Wart/Mole, Dryness, Hives, New Lesions, Non-Healing Wounds, Rash and Ulcer. HEENT Present- Seasonal Allergies and Wears glasses/contact lenses. Not Present- Earache, Hearing Loss, Hoarseness, Nose Bleed, Oral Ulcers, Ringing in the Ears, Sinus Pain, Sore Throat, Visual Disturbances and Yellow Eyes. Respiratory Present- Snoring. Not Present- Bloody sputum, Chronic Cough, Difficulty Breathing and Wheezing. Cardiovascular  Present- Leg Cramps. Not Present- Chest Pain, Difficulty Breathing Lying Down, Palpitations, Rapid Heart Rate, Shortness of Breath and Swelling of Extremities. Gastrointestinal Present- Abdominal  Pain, Change in Bowel Habits, Constipation, Excessive gas, Hemorrhoids, Indigestion and Rectal Pain. Not Present- Bloating, Bloody Stool, Chronic diarrhea, Difficulty Swallowing, Gets full quickly at meals, Nausea and Vomiting. Male Genitourinary Present- Change in Urinary Stream, Frequency, Impotence, Nocturia and Urgency. Not Present- Blood in Urine, Painful Urination and Urine Leakage. Musculoskeletal Present- Back Pain. Not Present- Joint Pain, Joint Stiffness, Muscle Pain, Muscle Weakness and Swelling of Extremities. Neurological Present- Decreased Memory, Numbness and Tingling. Not Present- Fainting, Headaches, Seizures, Tremor, Trouble walking and Weakness. Psychiatric Present- Anxiety, Bipolar, Depression, Fearful and Frequent crying. Not Present- Change in Sleep Pattern. Endocrine Present- Hot flashes. Not Present- Cold Intolerance, Excessive Hunger, Hair Changes, Heat Intolerance and New Diabetes. Hematology Not Present- Blood Thinners, Easy Bruising, Excessive bleeding, Gland problems, HIV and Persistent Infections.  Vitals ([name] CMA; 09/25/2019 9:57 AM) 09/25/2019 9:56 AM Weight: 242.13 lb Height: 70in Body Surface Area: 2.26 m Body Mass Index: 34.74 kg/m  Temp.: 97.47F  Pulse: 80 (Regular)  BP: 120/82(Sitting, Left Arm, Standard)        Physical Exam Jeremiah Hunter(Sameerah Nachtigal C. Mazi Brailsford MD; 09/25/2019 10:32 AM)  General Mental Status-Alert. General Appearance-Not in acute distress, Not Sickly. Orientation-Oriented X3. Hydration-Well hydrated. Voice-Normal. Note: Moving somewhat slowly but steadily. Not toxic or sickly. Alert and pleasant.  Integumentary Global Assessment Upon inspection and palpation of skin surfaces of the - Axillae: non-tender, no inflammation or  ulceration, no drainage. and Distribution of scalp and body hair is normal. General Characteristics Temperature - normal warmth is noted.  Head and Neck Head-normocephalic, atraumatic with no lesions or palpable masses. Face Global Assessment - atraumatic, no absence of expression. Neck Global Assessment - no abnormal movements, no bruit auscultated on the right, no bruit auscultated on the left, no decreased range of motion, non-tender. Trachea-midline. Thyroid Gland Characteristics - non-tender.  Eye Eyeball - Left-Extraocular movements intact, No Nystagmus - Left. Eyeball - Right-Extraocular movements intact, No Nystagmus - Right. Cornea - Left-No Hazy - Left. Cornea - Right-No Hazy - Right. Sclera/Conjunctiva - Left-No scleral icterus, No Discharge - Left. Sclera/Conjunctiva - Right-No scleral icterus, No Discharge - Right. Pupil - Left-Direct reaction to light normal. Pupil - Right-Direct reaction to light normal.  ENMT Ears Pinna - Left - no drainage observed, no generalized tenderness observed. Pinna - Right - no drainage observed, no generalized tenderness observed. Nose and Sinuses External Inspection of the Nose - no destructive lesion observed. Inspection of the nares - Left - quiet respiration. Inspection of the nares - Right - quiet respiration. Mouth and Throat Lips - Upper Lip - no fissures observed, no pallor noted. Lower Lip - no fissures observed, no pallor noted. Nasopharynx - no discharge present. Oral Cavity/Oropharynx - Tongue - no dryness observed. Oral Mucosa - no cyanosis observed. Hypopharynx - no evidence of airway distress observed.  Chest and Lung Exam Inspection Movements - Normal and Symmetrical. Accessory muscles - No use of accessory muscles in breathing. Palpation Palpation of the chest reveals - Non-tender. Auscultation Breath sounds - Normal and Clear.  Cardiovascular Auscultation Rhythm - Regular. Murmurs & Other Heart  Sounds - Auscultation of the heart reveals - No Murmurs and No Systolic Clicks.  Abdomen Inspection Inspection of the abdomen reveals - No Visible peristalsis and No Abnormal pulsations. Umbilicus - No Bleeding, No Urine drainage. Palpation/Percussion Palpation and Percussion of the abdomen reveal - Soft, Non Tender, No Rebound tenderness, No Rigidity (guarding) and No Cutaneous hyperesthesia. Note: Abdomen soft. Nontender. Not distended. No umbilical or incisional hernias. No guarding.  Male Genitourinary Sexual Maturity Tanner 5 - Adult hair pattern and Adult penile size and shape.  Rectal Note: Please refer to anoscopy.  External hemorrhoids x2 with grade 2 and 3 internal hemorrhoids. Some inflammation and pruritus. No fissure. No fistula. No condyloma. Normal sphincter tone. No rectal masses felt up to 6 cm by digital exam.  Peripheral Vascular Upper Extremity Inspection - Left - No Cyanotic nailbeds - Left, Not Ischemic. Inspection - Right - No Cyanotic nailbeds - Right, Not Ischemic.  Neurologic Neurologic evaluation reveals -normal attention span and ability to concentrate, able to name objects and repeat phrases. Appropriate fund of knowledge , normal sensation and normal coordination. Mental Status Affect - not angry, not paranoid. Cranial Nerves-Normal Bilaterally. Gait-Normal.  Neuropsychiatric Mental status exam performed with findings of-able to articulate well with normal speech/language, rate, volume and coherence, thought content normal with ability to perform basic computations and apply abstract reasoning and no evidence of hallucinations, delusions, obsessions or homicidal/suicidal ideation.  Musculoskeletal Global Assessment Spine, Ribs and Pelvis - no instability, subluxation or laxity. Right Upper Extremity - no instability, subluxation or laxity. Note: Somewhat of a limp. Walks with cane. Favors right lower extremity.  Lymphatic Head &  Neck  General Head & Neck Lymphatics: Bilateral - Description - No Localized lymphadenopathy. Axillary  General Axillary Region: Bilateral - Description - No Localized lymphadenopathy. Femoral & Inguinal  Generalized Femoral & Inguinal Lymphatics: Left - Description - No Localized lymphadenopathy. Right - Description - No Localized lymphadenopathy.   Results Jeremiah Sportsman MD; 09/25/2019 10:34 AM) Procedures  Name Value Date Hemorrhoids Procedure Anal exam: External Hemorrhoid Internal exam: Internal Hemorroids ( non-bleeding) prolapse Other: Large external hemorrhoids right posterior and right anterior especially carrying over posterior midline. Mild pruritus. Internal digital anoscopic exam reveals enlarged internal hemorrhoids especially right posterior and left lateral at least grade 2 probably grade 3. Irritation but no active bleeding. Sensitive but from post-............Marland KitchenPerianal skin clean with good hygiene. No pruritis ani. No pilonidal disease. No fissure. No abscess/fistula. Normal sphincter tone. ....Marland KitchenMarland KitchenNo condyloma warts. Tolerates digital and anoscopic rectal exam. No othere rectal masses felt 6 cm by digital exam.  Performed: 09/25/2019 10:28 AM    Assessment & Plan Jeremiah Sportsman MD; 09/25/2019 10:30 AM)  PROLAPSED INTERNAL HEMORRHOIDS, GRADE 3 (K64.2) Impression: Enlarged hemorrhoids with history very suspicious for prolapsing and somewhat reproduced on anoscopy today. External hemorrhoids as well.  I think these are too large and sensitive to tolerate banding. I am skeptical banding at work especially since he has external complaints. I recommended outpatient surgery with hemorrhoidal ligation/pexy and hemorrhoidectomy of redundant hemorrhoids. He wishes to be aggressive and proceed with surgery. We will work to coordinate a convenient time.  The anatomy & physiology of the anorectal region was discussed. The pathophysiology of hemorrhoids  and differential diagnosis was discussed. Natural history progression was discussed. I stressed the importance of a bowel regimen to have daily soft bowel movements to minimize progression of disease. Goal of one BM / day ideal. Use of wet wipes, warm baths, avoiding straining, etc were emphasized.  Educational handouts further explaining the pathology, treatment options, and bowel regimen were given as well. The patient expressed understanding.   EXTERNAL HEMORRHOIDS WITH COMPLICATION (K64.4)  Current Plans ANOSCOPY, DIAGNOSTIC (27035) Pt Education - [facility] Hemorrhoids (Emrey Thornley): discussed with patient and provided information. Pt Education - Pamphlet Given - The Hemorrhoid Book: discussed with patient and provided information.  ENCOUNTER FOR PREOPERATIVE EXAMINATION FOR GENERAL SURGICAL PROCEDURE (Z01.818)  Current  Plans You are being scheduled for surgery- Our schedulers will call you.  You should hear from our office's scheduling department within 5 working days about the location, date, and time of surgery. We try to make accommodations for patient's preferences in scheduling surgery, but sometimes the OR schedule or the surgeon's schedule prevents [place] from making those accommodations.  If you have not heard from our office [phone]) in 5 working days, call the office and ask for your surgeon's nurse.  If you have other questions about your diagnosis, plan, or surgery, call the office and ask for your surgeon's nurse.  The anatomy & physiology of the anorectal region was discussed. The pathophysiology of hemorrhoids and differential diagnosis was discussed. Natural history risks without surgery was discussed. I stressed the importance of a bowel regimen to have daily soft bowel movements to minimize progression of disease. Interventions such as sclerotherapy & banding were discussed.  The patient's symptoms are not adequately controlled by medicines and other non-operative  treatments. I feel the risks & problems of no surgery outweigh the operative risks; therefore, I recommended surgery to treat the hemorrhoids by ligation, pexy, and possible resection.  Risks such as bleeding, infection, urinary difficulties, need for further treatment, heart attack, death, and other risks were discussed. I noted a good likelihood this will help address the problem. Goals of post-operative recovery were discussed as well. Possibility that this will not correct all symptoms was explained. Post-operative pain, bleeding, constipation, and other problems after surgery were discussed. We will work to minimize complications. Educational handouts further explaining the pathology, treatment options, and bowel regimen were given as well. Questions were answered. The patient expresses understanding & wishes to proceed with surgery.  Pt Education - [facility] Rectal Prep for Anorectal outpatient/office surgery: discussed with patient and provided information. Pt Education - [facility] Rectal Surgery HCI (Ayzia Day): discussed with patient and provided information.  Jeremiah Sportsman, MD, FACS, MASCRS Gastrointestinal and Minimally Invasive Surgery  [facility] [address], [address] [place], [place] [address] [phone] Fax [phone] Main/Paging  CONTACT INFORMATION: Weekday (9AM-5PM) concerns: Call [facility] main office at [phone] Weeknight (5PM-9AM) or Weekend/Holiday concerns: Check [email or web address] for General Surgery [facility] coverage (Please, do not use SecureChat as it is not reliable communication to operating surgeons for immediate patient care)

## 2019-09-29 ENCOUNTER — Telehealth: Payer: Self-pay

## 2019-09-29 ENCOUNTER — Ambulatory Visit: Payer: Medicaid Other | Admitting: Critical Care Medicine

## 2019-09-29 ENCOUNTER — Telehealth: Payer: Self-pay | Admitting: Critical Care Medicine

## 2019-09-29 DIAGNOSIS — G5712 Meralgia paresthetica, left lower limb: Secondary | ICD-10-CM

## 2019-09-29 NOTE — Telephone Encounter (Signed)
Called pt wanted to speak with PCP for possible nurse aide assistance order to help with bathing . Dr Delford Field out of the office today / will relate message once he returns/ Stated surgery has not been scheduled and he still would like to have it on hold at this time. Advised pt to reach the office and let them know. Advised pt if in need of immediate assistance from a provider to call our office or he can can walk in to be seen at our mobile clinic in the absence of PCP/ Verbalized understanding

## 2019-09-29 NOTE — Telephone Encounter (Signed)
    Copied from CRM [id number]. Topic: General - Inquiry >> Sep 07, 2019  4:49 PM Leary Roca wrote: Reason for CRM: Pt is requesting a call back regarding medication . Pt is also needing a referral to a ortho . Please advise

## 2019-09-29 NOTE — Telephone Encounter (Signed)
Copied from CRM [id number]. Topic: General - Other >> Sep 29, 2019  8:39 AM [name] wrote: Reason for CRM: Pt called stating that he is having trouble walking around his home, bathing, and cleaning. Pt is requesting to have a nurse to come and help. Pt states that he was seen to have surgery for his hemorrhoids. He states that he is requesting to have pcp contact [facility] and ask them to put the surgery on hold due to his thigh pain. Please advise.

## 2019-09-30 NOTE — Telephone Encounter (Signed)
Can you please inquire of the patient the indication for Ortho referral? Thanks

## 2019-10-01 NOTE — Telephone Encounter (Signed)
Spoke with pt stated no need for an ortho / he misunderstood he meant Neurology //

## 2019-10-01 NOTE — Telephone Encounter (Signed)
Can you please forward to Dr Delford Field who saw this patient as I have never seen him and he would still need an indication for referral. Thanks

## 2019-10-02 ENCOUNTER — Other Ambulatory Visit: Payer: Self-pay | Admitting: Critical Care Medicine

## 2019-10-02 ENCOUNTER — Telehealth: Payer: Self-pay | Admitting: Critical Care Medicine

## 2019-10-02 NOTE — Telephone Encounter (Signed)
Pt was pers care service  He knows he may have to pay for this  I will order   He is out of gabapentin 300s  I told him to use the 400mg  gaba twice a day for now , he has a supply of that medication

## 2019-10-02 NOTE — Progress Notes (Signed)
Opened in error

## 2019-10-02 NOTE — Addendum Note (Signed)
Addended by: Shan Levans E on: 10/02/2019 09:50 AM   Modules accepted: Orders

## 2019-10-02 NOTE — Telephone Encounter (Signed)
Copied from CRM [id number]. Topic: Referral - Request for Referral >> Oct 02, 2019  2:54 PM Randol Kern wrote: Pt called and would like to be contacted about his referral request from today, please advise. Seeking in home health aids/PT  Best contact: [phone]

## 2019-10-05 ENCOUNTER — Ambulatory Visit: Payer: Self-pay

## 2019-10-05 NOTE — Telephone Encounter (Signed)
Order was just placed

## 2019-10-05 NOTE — Telephone Encounter (Signed)
Pt wants to know if you have ordered an aid for him yet?

## 2019-10-05 NOTE — Telephone Encounter (Signed)
Call placed to patient regarding need for personal care services. He said that he needs assistance with bathing and meal preparation as well as some light housekeeping.  He was in agreement with placing a referral to [employer] for [employer]

## 2019-10-05 NOTE — Telephone Encounter (Signed)
°   HR 2  Jeremiah Hunter Male, 59 y.o., 1960/08/11 MRN:  [id number] Phone:  [phone] Judie Petit) PCP:  Storm Frisk, MD Coverage:  Medicaid [place]/Medicaid [place] Access Next Appt With Neurology 10/07/2019 at 2:15 PM Message from Crist Infante sent at 10/05/2019 9:28 AM EDT  Pt wants to know if a heating pad will help his back pain.  Also reports his stool has been real light since last Friday. Wants to know if he should be concerned.   Call History   Type Contact Phone  10/05/2019 09:26 AM EDT Phone (Incoming) Mingo Amber (Self) [phone] (H)  User: Crist Infante   Pt. Still having left thigh pain. Will try heating pad. Also has had a change in stool color - beige. Stools are formed. No diarrhea. No new medications. Wants PCP to be aware.Seeing neurologists this Wednesday. Answer Assessment - Initial Assessment Questions 1. ONSET: "When did the pain start?"      July 2. LOCATION: "Where is the pain located?"      Left thigh 3. PAIN: "How bad is the pain?"    (Scale 1-10; or mild, moderate, severe)   -  MILD (1-3): doesn't interfere with normal activities    -  MODERATE (4-7): interferes with normal activities (e.g., work or school) or awakens from sleep, limping    -  SEVERE (8-10): excruciating pain, unable to do any normal activities, unable to walk     5 4. WORK OR EXERCISE: "Has there been any recent work or exercise that involved this part of the body?"      No 5. CAUSE: "What do you think is causing the leg pain?"     Unsure 6. OTHER SYMPTOMS: "Do you have any other symptoms?" (e.g., chest pain, back pain, breathing difficulty, swelling, rash, fever, numbness, weakness)     Weakness 7. PREGNANCY: "Is there any chance you are pregnant?" "When was your last menstrual period?"     n/a  Protocols used: LEG PAIN-A-AH

## 2019-10-05 NOTE — Addendum Note (Signed)
Addended by: Shan Levans E on: 10/05/2019 09:51 AM   Modules accepted: Orders

## 2019-10-06 ENCOUNTER — Encounter: Payer: Medicaid Other | Admitting: Physical Therapy

## 2019-10-06 ENCOUNTER — Telehealth: Payer: Self-pay

## 2019-10-06 NOTE — Telephone Encounter (Signed)
Referral for PCS faxed to [facility] 

## 2019-10-07 ENCOUNTER — Ambulatory Visit (INDEPENDENT_AMBULATORY_CARE_PROVIDER_SITE_OTHER): Payer: Medicaid Other | Admitting: Neurology

## 2019-10-07 ENCOUNTER — Encounter: Payer: Self-pay | Admitting: Neurology

## 2019-10-07 ENCOUNTER — Ambulatory Visit: Payer: Medicaid Other | Admitting: Neurology

## 2019-10-07 DIAGNOSIS — G5712 Meralgia paresthetica, left lower limb: Secondary | ICD-10-CM

## 2019-10-07 NOTE — Procedures (Signed)
° ° ° °  HISTORY:  Jeremiah Hunter is a 58 year old gentleman with a history of discomfort in anterolateral aspect of the left thigh with numbness and dysesthesias.  The patient denies any pain below the knee, he does have some back pain.  He is being evaluated for a possible neuropathy or a lumbosacral radiculopathy.  NERVE CONDUCTION STUDIES:  Nerve conduction studies were performed on both lower extremities. The distal motor latencies and motor amplitudes for the peroneal and posterior tibial nerves were within normal limits. The nerve conduction velocities for these nerves were also normal. The sensory latencies for the peroneal and sural nerves were within normal limits. The F wave latencies for the posterior tibial nerves were within normal limits.   EMG STUDIES:  EMG study was performed on the left lower extremity:  The tibialis anterior muscle reveals 2 to 4K motor units with full recruitment. No fibrillations or positive waves were seen. The peroneus tertius muscle reveals 2 to 4K motor units with full recruitment. No fibrillations or positive waves were seen. The medial gastrocnemius muscle reveals 1 to 3K motor units with full recruitment. No fibrillations or positive waves were seen. The vastus lateralis muscle reveals 2 to 4K motor units with full recruitment. No fibrillations or positive waves were seen. The iliopsoas muscle reveals 2 to 4K motor units with full recruitment. No fibrillations or positive waves were seen. The biceps femoris muscle (long head) reveals 2 to 4K motor units with full recruitment. No fibrillations or positive waves were seen. The lumbosacral paraspinal muscles were tested at 3 levels, and revealed no abnormalities of insertional activity at all 3 levels tested. There was good relaxation.   IMPRESSION:  Nerve conduction studies done on both lower extremities were within normal limits.  No evidence of a neuropathy is seen.  EMG evaluation of the left lower  extremity is unremarkable, no evidence of an overlying lumbosacral radiculopathy is noted.  The pain distribution described by the patient is consistent with a meralgia paresthetica.  [name] MD 10/07/2019 3:11 PM  [facility] [address] [place], [place] [address]  Phone [phone] Fax [phone]

## 2019-10-07 NOTE — Progress Notes (Signed)
Please refer to EMG and nerve conduction procedure note.  

## 2019-10-08 ENCOUNTER — Telehealth (INDEPENDENT_AMBULATORY_CARE_PROVIDER_SITE_OTHER): Payer: Self-pay | Admitting: Critical Care Medicine

## 2019-10-08 ENCOUNTER — Telehealth: Payer: Self-pay | Admitting: Neurology

## 2019-10-08 ENCOUNTER — Telehealth: Payer: Self-pay | Admitting: *Deleted

## 2019-10-08 NOTE — Telephone Encounter (Signed)
Message being routed to his PCP for assurance.

## 2019-10-08 NOTE — Telephone Encounter (Signed)
Copied from CRM [id number]. Topic: General - Other >> Sep 29, 2019  8:39 AM [name] wrote: Reason for CRM: Pt called stating that he is having trouble walking around his home, bathing, and cleaning. Pt is requesting to have a nurse to come and help. Pt states that he was seen to have surgery for his hemorrhoids. He states that he is requesting to have pcp contact [facility] and ask them to put the surgery on hold due to his thigh pain. Please advise.    ____________________________ 'procedure has taken place since message was seen.

## 2019-10-08 NOTE — Telephone Encounter (Signed)
Called the patient back and advised to make sure to avoid strict/tight clothing in the groin area. Alternating heat and ice will help. She recommend to continue the lidocaine cream. She recommended that he take 300 mg in am and 600 mg at bedtime to see if that helped and Pt advised that he has been addressing the gabapentin concerns with Dr Delford Field. He will continue to discuss what dosage he prefers and if he would like for [place] to take over ordering then he can reach out. I saw on file the patient was taking 300 mg TID and he states he ran out and he had 400 mg BID and per pt Dr Delford Field advised he could take those (it was ordered when he was dc from hospital) pt states he has been taking that dose for about a week and feels that it is slightly helping.

## 2019-10-08 NOTE — Telephone Encounter (Signed)
Test was completed yesterday, Dr Vickey Huger will have to review the results and then I will contact the patient with the findings.

## 2019-10-08 NOTE — Telephone Encounter (Signed)
Called the patient back and advised of the findings that were present with the nerve conduction study. Patient was asking about exercises that he could do to help. He states that it is very uncomfortable and the shock pains he has. He questioned if there was medication that would help with that. I see gabapentin 300 mg TID, asked if he was taking that and he states that he is but has not felt that it has been beneficial. I have advised I would talk to Dr Vickey Huger and see what her recommendations would be and if therapy would be beneficial as well. Pt verbalized understanding. Pt had no questions at this time but was encouraged to call back if questions arise.

## 2019-10-08 NOTE — Progress Notes (Addendum)
IMPRESSION:   Nerve conduction studies done on both lower extremities were  within normal limits. No evidence of a neuropathy is seen. EMG  evaluation of the left lower extremity is unremarkable, no  evidence of an overlying lumbosacral radiculopathy is noted. The  pain distribution described by the patient is consistent with a  meralgia paresthetica.   [name] MD  10/07/2019 3:11 PM      MNC    Nerve / Sites Muscle Latency Ref. Amplitude Ref. Rel Amp Segments Distance Velocity Ref. Area    ms ms mV mV %  cm m/s m/s mVms  L Peroneal - EDB     Ankle EDB 4.1 ?6.5 6.7 ?2.0 100 Ankle - EDB 9   22.2     Fib head EDB 10.7  4.7  70.2 Fib head - Ankle 29 44 ?44 15.5     Pop fossa EDB 13.0  5.6  120 Pop fossa - Fib head 10 44 ?44 20.6         Pop fossa - Ankle      R Peroneal - EDB     Ankle EDB 4.0 ?6.5 8.6 ?2.0 100 Ankle - EDB 9   25.1     Fib head EDB 10.8  7.5  88 Fib head - Ankle 30 44 ?44 23.8     Pop fossa EDB 13.0  7.2  95 Pop fossa - Fib head 10 46 ?44 23.7         Pop fossa - Ankle      L Tibial - AH     Ankle AH 5.5 ?5.8 6.0 ?4.0 100 Ankle - AH 9   13.4     Pop fossa AH 14.6  6.0  100 Pop fossa - Ankle 38 42 ?41 15.3  R Tibial - AH     Ankle AH 4.4 ?5.8 15.0 ?4.0 100 Ankle - AH 9   28.9     Pop fossa AH 13.0  12.3  82.2 Pop fossa - Ankle 39 45 ?41 28.5             SNC    Nerve / Sites Rec. Site Peak Lat Ref.  Amp Ref. Segments Distance    ms ms V V  cm  L Sural - Ankle (Calf)     Calf Ankle 3.6 ?4.4 8 ?6 Calf - Ankle 14  R Sural - Ankle (Calf)     Calf Ankle 3.4 ?4.4 12 ?6 Calf - Ankle 14  L Superficial peroneal - Ankle     Lat leg Ankle 3.7 ?4.4 7 ?6 Lat leg - Ankle 14  R Superficial peroneal - Ankle     Lat leg Ankle 3.6 ?4.4 6 ?6 Lat leg - Ankle 14             F  Wave    Nerve F Lat Ref.   ms ms  L Tibial - AH 52.3 ?56.0  R Tibial - AH 53.7 ?56.0

## 2019-10-08 NOTE — Telephone Encounter (Signed)
Pt called wanting to know if his test results are in. Please advise.

## 2019-10-08 NOTE — Telephone Encounter (Signed)
Copied from CRM [id number]. Topic: General - Other >> Oct 08, 2019  8:36 AM [name] wrote: Reason for CRM: Pt called and is requesting to know if it would be okay for him to take fish oil, Zinc, and vitamin C. Pt states that he has also started taking Fluoxetine which was prescribed by psychiatrist. Please advise.

## 2019-10-08 NOTE — Telephone Encounter (Signed)
-----   Message from Melvyn Novas, MD sent at 10/08/2019 12:12 PM EDT ----- IMPRESSION:   Nerve conduction studies done on both lower extremities were  within normal limits. No evidence of a neuropathy is seen. EMG  evaluation of the left lower extremity is unremarkable, no  evidence of an overlying lumbosacral radiculopathy is noted. The  pain distribution described by the patient is consistent with a  meralgia paresthetica.   Jeremiah Palau MD  10/07/2019 3:11 PM

## 2019-10-09 ENCOUNTER — Encounter: Payer: Self-pay | Admitting: Neurology

## 2019-10-09 ENCOUNTER — Telehealth: Payer: Self-pay | Admitting: Neurology

## 2019-10-09 NOTE — Telephone Encounter (Signed)
Spoke w/ pt and informed supplements okay to use per PCP. Pt also had many questions re: L waist pain radiating to L thigh w/ numbness/pain, soreness to thigh. This is a chronic issue that pt sees neurology for already, has medications in place for this but asking if can use anything OTC. Advised pt that he can use heating pad, alternate IBU/Tylenol if not contradicted and rest/elevation. Pt also asked about placement of Oxytrol patch, advised of sites to be used, ensure dry/intact skin to area and rotation of sites, pt verbalized understanding of all advice given.

## 2019-10-09 NOTE — Telephone Encounter (Signed)
These supplements are ok to use

## 2019-10-09 NOTE — Telephone Encounter (Signed)
Pt called, having numbness, spasm of pain in left thigh off and on. Is there anything over the counter can get? Would like a call from the nurse.

## 2019-10-13 ENCOUNTER — Encounter: Payer: Medicaid Other | Admitting: Physical Therapy

## 2019-10-14 ENCOUNTER — Ambulatory Visit: Payer: Medicaid Other | Attending: Critical Care Medicine | Admitting: Pharmacist

## 2019-10-14 ENCOUNTER — Telehealth: Payer: Self-pay

## 2019-10-14 ENCOUNTER — Other Ambulatory Visit: Payer: Self-pay

## 2019-10-14 DIAGNOSIS — Z23 Encounter for immunization: Secondary | ICD-10-CM | POA: Diagnosis not present

## 2019-10-14 NOTE — Telephone Encounter (Signed)
Spoke w/ pt and advised that stool can change colors sometimes depending on diet, pt denies any severe symptoms (no blood noted, abdominal pain) and reports that stool only slightly normal than usual, advised if no improvement by next scheduled appt to discuss w/ provider and if any worsening or new sxs prior to appt to call office for appt or info for mobile bus location that day, pt verbalized understanding

## 2019-10-14 NOTE — Progress Notes (Signed)
Patient presents for second vaccination dose against Zoster per orders of Dr. Delford Field. Consent given. Counseling provided. No contraindications exists. Vaccine administered without incident.   Fabio Neighbors, PharmD PGY2 Ambulatory Care Resident [facility]  Pharmacy

## 2019-10-14 NOTE — Telephone Encounter (Signed)
Patient came into the office today to have a appt with Jeremiah Hunter and was told to stop up front to report his complaints, patient says his stool is very light colors and has spots in it. Two times it has been normal, and patient also mentioned that he was told that he needs to be screened for breat cancer. Patient is also concerned about the MRI and Ct scans he did.    Please call and advise

## 2019-10-16 ENCOUNTER — Ambulatory Visit: Payer: Self-pay

## 2019-10-16 ENCOUNTER — Telehealth: Payer: Self-pay

## 2019-10-16 DIAGNOSIS — G4733 Obstructive sleep apnea (adult) (pediatric): Secondary | ICD-10-CM

## 2019-10-16 NOTE — Telephone Encounter (Signed)
Pt. Asking if he can use Lidocaine patch the same time wearing Oxytrol patch he uses from Urology. Please advise.

## 2019-10-16 NOTE — Telephone Encounter (Signed)
Pt. Reports he uses CPAP at night and has noticed abdominal bloating in the mornings. Has an appointment next week, "but I want my doctor to know." Please advise pt.

## 2019-10-19 ENCOUNTER — Ambulatory Visit: Payer: Medicaid Other | Attending: Critical Care Medicine | Admitting: Family

## 2019-10-19 ENCOUNTER — Telehealth: Payer: Self-pay | Admitting: Pharmacist

## 2019-10-19 DIAGNOSIS — I10 Essential (primary) hypertension: Secondary | ICD-10-CM | POA: Diagnosis not present

## 2019-10-19 DIAGNOSIS — F419 Anxiety disorder, unspecified: Secondary | ICD-10-CM | POA: Diagnosis not present

## 2019-10-19 DIAGNOSIS — G40909 Epilepsy, unspecified, not intractable, without status epilepticus: Secondary | ICD-10-CM | POA: Diagnosis not present

## 2019-10-19 DIAGNOSIS — N3281 Overactive bladder: Secondary | ICD-10-CM

## 2019-10-19 DIAGNOSIS — Z9989 Dependence on other enabling machines and devices: Secondary | ICD-10-CM

## 2019-10-19 DIAGNOSIS — M545 Low back pain, unspecified: Secondary | ICD-10-CM

## 2019-10-19 DIAGNOSIS — F329 Major depressive disorder, single episode, unspecified: Secondary | ICD-10-CM

## 2019-10-19 DIAGNOSIS — K644 Residual hemorrhoidal skin tags: Secondary | ICD-10-CM

## 2019-10-19 DIAGNOSIS — F32A Depression, unspecified: Secondary | ICD-10-CM

## 2019-10-19 DIAGNOSIS — G4733 Obstructive sleep apnea (adult) (pediatric): Secondary | ICD-10-CM

## 2019-10-19 NOTE — Telephone Encounter (Signed)
Call placed to patient. Reviewed application schedule with Oxytrol and Lidocaine patches. These medications do not carry a significant reaction when used together, however, patient should apply patches to different areas of the body. He was advised to avoid applying to same patch to the same area as applied previously.

## 2019-10-19 NOTE — Patient Instructions (Signed)

## 2019-10-19 NOTE — Progress Notes (Signed)
Virtual Visit via Telephone Note  I connected with Jeremiah Hunter, on 10/19/2019 at 1:56 PM by telephone due to the COVID-19 pandemic and verified that I am speaking with the correct person using two identifiers.  Due to current restrictions/limitations of in-office visits due to the COVID-19 pandemic, this scheduled clinical appointment was converted to a telehealth visit.   Consent: I discussed the limitations, risks, security and privacy concerns of performing an evaluation and management service by telephone and the availability of in person appointments. I also discussed with the patient that there may be a patient responsible charge related to this service. The patient expressed understanding and agreed to proceed.  Location of Patient: Home  Location of Provider: [facility]  Persons participating in Telemedicine visit: Jeremiah Hunter [name], NP [name], RN  History of Present Illness: Jeremiah Hunter is a 59 year old male with history of essential hypertension, seizure disorder, anxiety and depression, and bipolar affective disorder who presents with   1. HYPERTENSION FOLLOW-UP: 07/30/2019:  Visit with Dr. Delford Field. During that encounter continued on Hydrochlorothiazide and Metoprolol.  10/19/2019: Currently taking: see medication list Med Adherence: [x]  Yes    []  No Medication side effects: []  Yes    [x]  No  Adherence with salt restriction: [x]  Yes    []  No Exercise: Yes []  No [x]  Home Monitoring?: []  Yes    [x]  No Monitoring Frequency: []  Yes    [x]  No Home BP results range: []  Yes    [x]  No Smoking []  Yes [x]  No SOB? []  Yes    [x]  No Chest Pain?: []  Yes    [x]  No Leg swelling?: []  Yes    [x]  No Headaches?: []  Yes    [x]  No Dizziness? []  Yes    [x]  No  2. OVERACTIVE BLADDER FOLLOW-UP:  07/30/2019: Visit with Dr. . During that encounter continued on Vesicare.  10/19/2019: Today reports no longer taking Vesicare because he was  started on patches from his Urologist. Reports he is unsure if the patches are helping. Reports he has follow-up appointment in October/November 2021.  3. ANXIETY AND DEPRESSION FOLLOW-UP: Visit with Dr. . During that encounter stable.  10/19/2019: Today reports he is stable. Denies thoughts of self-harm and suicidal ideation. Reports he is taking Fluoxtine from his psychiatrist.   4. SEIZURE DISORDER FOLLOW-UP: 07/30/2019: Visit with Dr. . During that encounter continued on Depakote.  10/19/2019: Today reports he is taking Depakote. Reports he does not have a history of seizure. Reports he is taking Depakote for bipolar.  5. OSA FOLLOW-UP: 07/30/2019: Visit with Dr. . During that encounter tolerating CPAP well.   10/19/2019: Today reports he feels like he has air in his stomach and also has a lot of gas when he awakens in the morning. Reports he was told from his therapist that assists him with CPAP machine that he may need to readjust the settings on his CPAP.   6. BACK PAIN FOLLOW-UP: 07/30/2019 Visit with Dr. . During that encounter ordered lumbar spine film and apply Voltaren gel. Patient also given exercises for back.  10/19/2019: Today reports his back is feeling better and no longer using Voltaren gel. Reports he has concerns about the arthritis that was found on his back x-ray and would like referral to Orthopedics to determine what is going on.  7. HEMORRHOID FOLLOW-UP: 07/30/2019: Visit with Dr. . During that encounter referred to General Surgery.   10/19/2019: Today reports he recently went to Surgery  and they planned to do the surgery. Patient reports he told [facility] he would prefer to wait at that time. Reports he was told to notify the office when he is ready to proceed.   Past Medical History:  Diagnosis Date   DDD (degenerative disc disease), lumbar    Enlarged prostate    Hypertension    Severe episode of recurrent  major depressive disorder, with psychotic features (HCC) 03/24/2019   Allergies  Allergen Reactions   Losartan Itching    Current Outpatient Medications on File Prior to Visit  Medication Sig Dispense Refill   B Complex-C-E-Zn (EQL STRESS B-COMPLEX C/ZINC) TABS One a day po. With food.     cholecalciferol (VITAMIN D3) 25 MCG (1000 UNIT) tablet Take 2,000 Units by mouth daily.     diclofenac Sodium (VOLTAREN) 1 % GEL Apply 2 g topically 4 (four) times daily. (Patient not taking: Reported on 09/11/2019) 50 g 0   divalproex (DEPAKOTE ER) 500 MG 24 hr tablet Take by mouth.      doxepin (SINEQUAN) 150 MG capsule Take 150 mg by mouth at bedtime. (Patient not taking: Reported on 09/11/2019)     doxepin (SINEQUAN) 50 MG capsule Take 2 capsules (100 mg total) by mouth at bedtime. (Patient not taking: Reported on 09/11/2019) 60 capsule 2   gabapentin (NEURONTIN) 300 MG capsule Take 300 mg by mouth 3 (three) times daily.     hydrochlorothiazide (HYDRODIURIL) 25 MG tablet Take 1 tablet (25 mg total) by mouth daily. 90 tablet 3   hydrOXYzine (ATARAX/VISTARIL) 10 MG tablet Take 1 tablet (10 mg total) by mouth 3 (three) times daily as needed. (Patient not taking: Reported on 09/11/2019) 30 tablet 0   lidocaine-prilocaine (EMLA) cream Apply 1 application topically as needed. 30 g 5   loratadine (CLARITIN) 10 MG tablet Take 1 tablet (10 mg total) by mouth daily. 30 tablet 4   metoprolol succinate (TOPROL-XL) 25 MG 24 hr tablet Take 1 tablet (25 mg total) by mouth daily. 90 tablet 3   naproxen (NAPROSYN) 500 MG tablet Take 500 mg by mouth 2 (two) times daily. (Patient not taking: Reported on 09/11/2019)     oxybutynin (DITROPAN) 5 MG tablet Take 1 tablet (5 mg total) by mouth 3 (three) times daily. 90 tablet 3   OXYTROL 3.9 MG/24HR 1 patch 2 (two) times a week.     No current facility-administered medications on file prior to visit.    Observations/Objective: Alert and oriented x 3. Not in acute  distress. Physical examination not completed as this is a telemedicine visit.  Assessment and Plan: 1. Essential hypertension: - Continue Hydrochlorothiazide 25 mg daily as prescribed. Refills available on file through May 2022.  - Continue Metoprolol Succinate 25 mg daily as prescribed. Refills available on file through May 2022.  - Counseled on blood pressure goal of less than 130/80, low-sodium, DASH diet, medication compliance, 150 minutes of moderate intensity exercise per week as tolerated. Discussed medication compliance, adverse effects. - Last CMP obtained 08/20/2019. - Last CBC obtained 08/20/2019. - Follow-up with primary physician in 3 months or sooner if needed.  2. Overactive bladder: - Patient reports he is no longer taking Solifenacin. Reports currently taking Oxytrol patches prescribed by his Urologist. Continue as prescribed. Reports he has a follow-up appointment in October/November 2021.  - Keep all scheduled appointments with Urology.  3. Anxiety and depression: - Stable.  - Denies thoughts of self-harm and suicidal ideation.  - Reports taking Fluoxetine prescribed by his  psychiatrist. Continue as prescribed. - Keep all scheduled appointments with Psychiatry.  4. Seizure disorder [facility]): - Patient reports he does not have a history of seizures. Reports he is taking Depakote for bipolar.  - Continue Depakote take 1 tablet (500 mg total) by mouth two times daily as prescribed.  - Follow-up wit primary physician in 3 months or sooner if needed.  5. OSA on CPAP: - Today reports he feels like he has air in his stomach and also has a lot of gas when he awakens in the morning. Reports he was told from his therapist that assists him with CPAP machine that he may need to readjust the settings on his CPAP. - Follow-up with pulmonologist Dr. Delford Field within 1 week for further evaluation and management. - Ambulatory referral to Pulmonology  6. Acute bilateral low back pain without  sciatica: - Reports he is no longer using Voltaren gel because he is not having back pain anymore. Reports he has concerns about the arthritis that was found on his back x-ray in July 2021 and would like referral to Orthopedics to determine if additional interventions are necessary. - Referral to Orthopedics for further evaluation and management. - Ambulatory referral to Orthopedic Surgery  7. External hemorrhoid: - Today reports he recently went to [facility] and they planned to do the surgery. Patient reports he told [facility] he would prefer to wait at that time. Reports he was told to notify the office when he is ready to proceed. Patient reports he is not ready to proceed at this time.  Follow Up Instructions: Follow-up with primary physician in 3 months or sooner if needed.   Patient was given clear instructions to go to Emergency Department or return to medical center if symptoms don't improve, worsen, or new problems develop.The patient verbalized understanding.  I discussed the assessment and treatment plan with the patient. The patient was provided an opportunity to ask questions and all were answered. The patient agreed with the plan and demonstrated an understanding of the instructions.   The patient was advised to call back or seek an in-person evaluation if the symptoms worsen or if the condition fails to improve as anticipated.    I provided 30 minutes total of non-face-to-face time during this encounter including median intraservice time, reviewing previous notes, labs, imaging, medications, management and patient verbalized understanding.    Rema Fendt, NP  [facility] [place], [place] [phone]   10/19/2019, 8:18 AM

## 2019-10-20 ENCOUNTER — Telehealth: Payer: Self-pay

## 2019-10-20 ENCOUNTER — Encounter: Payer: Medicaid Other | Admitting: Physical Therapy

## 2019-10-20 NOTE — Telephone Encounter (Signed)
See new cpap settings and order printed out

## 2019-10-20 NOTE — Telephone Encounter (Signed)
Order for updated CPAP settings faxed to [facility]

## 2019-10-20 NOTE — Addendum Note (Signed)
Addended by: Shan Levans E on: 10/20/2019 02:40 PM   Modules accepted: Orders

## 2019-10-21 ENCOUNTER — Other Ambulatory Visit: Payer: Self-pay | Admitting: Critical Care Medicine

## 2019-10-21 NOTE — Telephone Encounter (Signed)
Medication Refill - Medication: gabapentin (NEURONTIN) 300 MG capsule     Preferred Pharmacy (with phone number or street name):  [id number] [place], [place] - [address] AT [facility] [address] Phone:  [phone]  Fax:  [phone]       Agent: Please be advised that RX refills may take up to 3 business days. We ask that you follow-up with your pharmacy.

## 2019-10-21 NOTE — Telephone Encounter (Signed)
Requested medication (s) are due for refill today: Yes Requested medication (s) are on the active medication list: Yes  Last refill:  09/11/19  Future visit scheduled: No  Notes to clinic:  Historical provider.    Requested Prescriptions  Pending Prescriptions Disp Refills   gabapentin (NEURONTIN) 300 MG capsule      Sig: Take 1 capsule (300 mg total) by mouth 3 (three) times daily.      Neurology: Anticonvulsants - gabapentin Passed - 10/21/2019  4:26 PM      Passed - Valid encounter within last 12 months    Recent Outpatient Visits           2 days ago Essential hypertension   [facility] [place], [place], NP   1 week ago Need for zoster vaccination   [facility] [place], Cornelius Moras, RPH-CPP   2 months ago Need for vaccination for zoster   [facility] [place], RPH-CPP   2 months ago Acute bilateral low back pain without sciatica   [facility] Storm Frisk, MD   4 months ago Essential hypertension   [facility] Storm Frisk, MD

## 2019-10-22 ENCOUNTER — Ambulatory Visit: Payer: Medicaid Other | Admitting: Neurology

## 2019-10-23 NOTE — Telephone Encounter (Signed)
Requested medication (s) are due for refill today: yes  Requested medication (s) are on the active medication list: yes  Last refill:  09/11/19  Future visit scheduled: no  Notes to clinic:  historical provider    Requested Prescriptions  Pending Prescriptions Disp Refills   gabapentin (NEURONTIN) 300 MG capsule      Sig: Take 1 capsule (300 mg total) by mouth 3 (three) times daily.      Neurology: Anticonvulsants - gabapentin Passed - 10/23/2019  8:42 AM      Passed - Valid encounter within last 12 months    Recent Outpatient Visits           4 days ago Essential hypertension   [facility] [place], [place], NP   1 week ago Need for zoster vaccination   [facility] Lois Huxley, Cornelius Moras, RPH-CPP   2 months ago Need for vaccination for zoster   [facility] [place], RPH-CPP   2 months ago Acute bilateral low back pain without sciatica   [facility] Storm Frisk, MD   4 months ago Essential hypertension   [facility] Storm Frisk, MD

## 2019-10-23 NOTE — Telephone Encounter (Signed)
Pt called to check status of refill request for Gabapentin/ please advise

## 2019-10-24 MED ORDER — GABAPENTIN 300 MG PO CAPS: 300.0000 mg | ORAL_CAPSULE | Freq: Three times a day (TID) | ORAL | 0 refills | Status: DC

## 2019-10-27 ENCOUNTER — Telehealth: Payer: Self-pay

## 2019-10-27 ENCOUNTER — Encounter: Payer: Medicaid Other | Admitting: Physical Therapy

## 2019-10-27 NOTE — Telephone Encounter (Signed)
Call placed to [facility], spoke to Tiffany who confirmed that the CPAP settings were changed according to  the order that was received 10/20/2019.

## 2019-10-30 ENCOUNTER — Other Ambulatory Visit: Payer: Self-pay

## 2019-10-30 ENCOUNTER — Ambulatory Visit: Payer: Medicaid Other | Admitting: Internal Medicine

## 2019-10-30 ENCOUNTER — Telehealth: Payer: Self-pay | Admitting: Internal Medicine

## 2019-10-30 ENCOUNTER — Encounter: Payer: Self-pay | Admitting: Internal Medicine

## 2019-10-30 VITALS — BP 100/80 | HR 94 | Temp 97.7°F | Ht 71.0 in | Wt 234.4 lb

## 2019-10-30 DIAGNOSIS — G4733 Obstructive sleep apnea (adult) (pediatric): Secondary | ICD-10-CM

## 2019-10-30 DIAGNOSIS — Z23 Encounter for immunization: Secondary | ICD-10-CM | POA: Diagnosis not present

## 2019-10-30 DIAGNOSIS — Z9989 Dependence on other enabling machines and devices: Secondary | ICD-10-CM

## 2019-10-30 DIAGNOSIS — I10 Essential (primary) hypertension: Secondary | ICD-10-CM

## 2019-10-30 NOTE — Telephone Encounter (Signed)
Called [employer] and spoke with [employer], states settings are Auto 5-10.  Requested patient be registered in [employer] so we can download the compliance.  Ashly will get it switched over from his pcp.  Nothing further needed.

## 2019-10-30 NOTE — Progress Notes (Signed)
10/30/19- 59 yoM never smoker for sleep evaluation courtesy of [name], NP at [facility].  Medical problem list includes Hemorrhoids, HTN, Seizure Disorder, Meralgia Paresthetica, Bipolar, Anxiety Depression,  NPSG 06/10/19- AHI 24.2/ hr, 78%, body weight today 240 lbs Body weight today- Epworth score- 2 Covid vax- Janssen Flu vax- -----hx sleep apnea, has cpap/ [employer] [facility] recently released from prison, where he was told of loud snoring and choking in his sleep. CPAP 5-10/ [employer] Download compliance 100%, AHI 1.8/ hr Using full face mask, he c/o abdominal bloating. 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  Denies ENT surgery, heart or lung disease. Sleeps better with CPAP. Still having difficulty initiating sleep.  Denies current sleep med use.   Prior to Admission medications   Medication Sig Start Date End Date Taking? Authorizing Provider  B Complex-C-E-Zn (EQL STRESS B-COMPLEX C/ZINC) TABS One a day po. With food. 09/11/19  Yes Dohmeier, Porfirio Mylar, MD  cholecalciferol (VITAMIN D3) 25 MCG (1000 UNIT) tablet Take 2,000 Units by mouth daily.   Yes [provider]  diclofenac Sodium (VOLTAREN) 1 % GEL Apply 2 g topically 4 (four) times daily. 07/30/19  Yes Storm Frisk, MD  divalproex (DEPAKOTE ER) 500 MG 24 hr tablet Take by mouth.  08/10/19  Yes [provider]  doxepin (SINEQUAN) 150 MG capsule Take 150 mg by mouth at bedtime.  07/31/19  Yes [provider]  doxepin (SINEQUAN) 50 MG capsule Take 2 capsules (100 mg total) by mouth at bedtime. 07/31/19  Yes Storm Frisk, MD  gabapentin (NEURONTIN) 300 MG capsule Take 1 capsule (300 mg total) by mouth 3 (three) times daily. 10/24/19  Yes Storm Frisk, MD  hydrochlorothiazide (HYDRODIURIL) 25 MG tablet Take 1 tablet (25 mg total) by mouth daily. 06/11/19  Yes Storm Frisk, MD  hydrOXYzine (ATARAX/VISTARIL) 10 MG tablet Take 1 tablet (10 mg total) by mouth 3 (three) times daily as needed.  08/18/19  Yes Mayers, Cari S, PA-C  lidocaine-prilocaine (EMLA) cream Apply 1 application topically as needed. 09/14/19  Yes Dohmeier, Porfirio Mylar, MD  loratadine (CLARITIN) 10 MG tablet Take 1 tablet (10 mg total) by mouth daily. 06/11/19  Yes Storm Frisk, MD  metoprolol succinate (TOPROL-XL) 25 MG 24 hr tablet Take 1 tablet (25 mg total) by mouth daily. 06/11/19  Yes Storm Frisk, MD  naproxen (NAPROSYN) 500 MG tablet Take 500 mg by mouth 2 (two) times daily.  09/05/19  Yes [provider]  oxybutynin (DITROPAN) 5 MG tablet Take 1 tablet (5 mg total) by mouth 3 (three) times daily. 07/31/19  Yes Storm Frisk, MD  OXYTROL 3.9 MG/24HR 1 patch 2 (two) times a week. 08/29/19  Yes [provider]   Past Medical History:  Diagnosis Date  . DDD (degenerative disc disease), lumbar   . Enlarged prostate   . Hypertension   . Severe episode of recurrent major depressive disorder, with psychotic features (HCC) 03/24/2019  . Sleep apnea    Past Surgical History:  Procedure Laterality Date  . CYSTECTOMY    . HERNIA REPAIR     Family History  Family history unknown: Yes   Social History   Socioeconomic History  . Marital status: Single    Spouse name: Not on file  . Number of children: Not on file  . Years of education: Not on file  . Highest education level: Not on file  Occupational History  . Not on file  Tobacco Use  . Smoking status:  Never Smoker  . Smokeless tobacco: Never Used  Vaping Use  . Vaping Use: Never used  Substance and Sexual Activity  . Alcohol use: Never  . Drug use: Never  . Sexual activity: Not Currently  Other Topics Concern  . Not on file  Social History Narrative  . Not on file   Social Determinants of Health   Financial Resource Strain:   . Difficulty of Paying Living Expenses: Not on file  Food Insecurity:   . Worried About Programme researcher, broadcasting/film/video in the Last Year: Not on file  . Ran Out of Food in the Last Year: Not on file   Transportation Needs:   . Lack of Transportation (Medical): Not on file  . Lack of Transportation (Non-Medical): Not on file  Physical Activity:   . Days of Exercise per Week: Not on file  . Minutes of Exercise per Session: Not on file  Stress:   . Feeling of Stress : Not on file  Social Connections:   . Frequency of Communication with Friends and Family: Not on file  . Frequency of Social Gatherings with Friends and Family: Not on file  . Attends Religious Services: Not on file  . Active Member of Clubs or Organizations: Not on file  . Attends Banker Meetings: Not on file  . Marital Status: Not on file  Intimate Partner Violence:   . Fear of Current or Ex-Partner: Not on file  . Emotionally Abused: Not on file  . Physically Abused: Not on file  . Sexually Abused: Not on file   ROS-see HPI   + = positive Constitutional:    weight loss, night sweats, fevers, chills, fatigue, lassitude. HEENT:   ++ headaches, difficulty swallowing,+ tooth/dental problems, sore throat,       sneezing, itching, ear ache, nasal congestion, post nasal drip, snoring CV:    chest pain, orthopnea, PND, swelling in lower extremities, anasarca,                                  dizziness, palpitations Resp:   +shortness of breath with exertion or at rest.                productive cough,   non-productive cough, coughing up of blood.              change in color of mucus.  wheezing.   Skin:    rash or lesions. GI:  No-   heartburn, indigestion, abdominal pain, nausea, vomiting, diarrhea,                 change in bowel habits, loss of appetite GU: dysuria, change in color of urine, no urgency or frequency.   flank pain. MS:   joint pain, stiffness, decreased range of motion, back pain. Neuro-     nothing unusual Psych:  change in mood or affect.  +depression or +anxiety.   memory loss.  OBJ- Physical Exam General- Alert, Oriented, Affect-appropriate, Distress- none acute Skin- rash-none,  lesions- none, excoriation- none Lymphadenopathy- none Head- atraumatic            Eyes- Gross vision intact, PERRLA, conjunctivae and secretions clear            Ears- Hearing, canals-normal            Nose- Clear, no-Septal dev, mucus, polyps, erosion, perforation  Throat- Mallampati III , mucosa clear , drainage- none, tonsils- atrophic,  + teeth Neck- flexible , trachea midline, no stridor , thyroid nl, carotid no bruit Chest - symmetrical excursion , unlabored           Heart/CV- RRR , no murmur , no gallop  , no rub, nl s1 s2                           - JVD- none , edema- none, stasis changes- none, varices- none           Lung- clear to P&A, wheeze- none, cough- none , dullness-none, rub- none           Chest wall-  Abd-  Br/ Gen/ Rectal- Not done, not indicated Extrem- cyanosis- none, clubbing, none, atrophy- none, strength- nl Neuro- + speech slow, + sucking on tongue blade in mouth ?frontal lobe

## 2019-10-30 NOTE — Assessment & Plan Note (Signed)
Benefits from CPAP with improved sleep. Good compliance and control. To address complaint of bloating, will try changing to fixed CPAP 8

## 2019-10-30 NOTE — Assessment & Plan Note (Signed)
Discussed interaction of OSA and HTN.

## 2019-10-30 NOTE — Patient Instructions (Signed)
Order- DME [employer]- please change CPAP pressure to fixed 8. Continue mask of choice,humidifier, supplies, /air/view/ card  Order- flu vax standard  Please call if we can help

## 2019-11-03 ENCOUNTER — Ambulatory Visit: Payer: Medicaid Other | Admitting: Family Medicine

## 2019-11-03 ENCOUNTER — Telehealth: Payer: Self-pay | Admitting: Critical Care Medicine

## 2019-11-03 NOTE — Telephone Encounter (Signed)
Spoke w/ pt and he reports that he notices that his R foot and ankle weaken/give out when he is walking. Foot has started being turned to the side constantly, even when wearing support shoes that he has has since Mar/Apr, pt is asking if he should be referred back to foot doctor since he has not seen them for follow-up in a long time, pls advise.

## 2019-11-03 NOTE — Telephone Encounter (Signed)
Copied from CRM [id number]. Topic: General - Other >> Nov 03, 2019  8:32 AM Tamela Oddi wrote: Reason for CRM: Called to inform the doctor that he is having problems with his foot and it is affecting his side as well.  He is not sure what he should do and would like the nurse or doctor to call him to discuss at [phone]

## 2019-11-04 NOTE — Telephone Encounter (Signed)
Spoke w/ pt and scheduled visit w/ PCP for 10/20 @ 9:30am

## 2019-11-04 NOTE — Telephone Encounter (Signed)
I have not seen him in a while  Get him in with me first

## 2019-11-10 ENCOUNTER — Ambulatory Visit: Payer: Medicaid Other | Admitting: Family Medicine

## 2019-11-11 ENCOUNTER — Telehealth: Payer: Self-pay | Admitting: Critical Care Medicine

## 2019-11-11 ENCOUNTER — Encounter: Payer: Self-pay | Admitting: Critical Care Medicine

## 2019-11-11 ENCOUNTER — Ambulatory Visit: Payer: Medicaid Other | Attending: Critical Care Medicine | Admitting: Critical Care Medicine

## 2019-11-11 ENCOUNTER — Other Ambulatory Visit: Payer: Self-pay

## 2019-11-11 VITALS — BP 118/81 | HR 65 | Resp 16 | Wt 232.6 lb

## 2019-11-11 DIAGNOSIS — R531 Weakness: Secondary | ICD-10-CM | POA: Insufficient documentation

## 2019-11-11 DIAGNOSIS — G5712 Meralgia paresthetica, left lower limb: Secondary | ICD-10-CM | POA: Diagnosis not present

## 2019-11-11 DIAGNOSIS — L501 Idiopathic urticaria: Secondary | ICD-10-CM | POA: Insufficient documentation

## 2019-11-11 DIAGNOSIS — Z79899 Other long term (current) drug therapy: Secondary | ICD-10-CM | POA: Insufficient documentation

## 2019-11-11 DIAGNOSIS — N3281 Overactive bladder: Secondary | ICD-10-CM | POA: Diagnosis not present

## 2019-11-11 DIAGNOSIS — F4310 Post-traumatic stress disorder, unspecified: Secondary | ICD-10-CM | POA: Diagnosis not present

## 2019-11-11 DIAGNOSIS — M5136 Other intervertebral disc degeneration, lumbar region: Secondary | ICD-10-CM | POA: Insufficient documentation

## 2019-11-11 DIAGNOSIS — M79672 Pain in left foot: Secondary | ICD-10-CM | POA: Diagnosis not present

## 2019-11-11 DIAGNOSIS — Z5901 Sheltered homelessness: Secondary | ICD-10-CM | POA: Insufficient documentation

## 2019-11-11 DIAGNOSIS — Z9989 Dependence on other enabling machines and devices: Secondary | ICD-10-CM

## 2019-11-11 DIAGNOSIS — K648 Other hemorrhoids: Secondary | ICD-10-CM | POA: Diagnosis not present

## 2019-11-11 DIAGNOSIS — G629 Polyneuropathy, unspecified: Secondary | ICD-10-CM | POA: Insufficient documentation

## 2019-11-11 DIAGNOSIS — G40909 Epilepsy, unspecified, not intractable, without status epilepticus: Secondary | ICD-10-CM | POA: Diagnosis not present

## 2019-11-11 DIAGNOSIS — K642 Third degree hemorrhoids: Secondary | ICD-10-CM

## 2019-11-11 DIAGNOSIS — G8929 Other chronic pain: Secondary | ICD-10-CM | POA: Insufficient documentation

## 2019-11-11 DIAGNOSIS — Z7901 Long term (current) use of anticoagulants: Secondary | ICD-10-CM | POA: Diagnosis not present

## 2019-11-11 DIAGNOSIS — I10 Essential (primary) hypertension: Secondary | ICD-10-CM

## 2019-11-11 DIAGNOSIS — M545 Low back pain, unspecified: Secondary | ICD-10-CM | POA: Diagnosis not present

## 2019-11-11 DIAGNOSIS — M25569 Pain in unspecified knee: Secondary | ICD-10-CM | POA: Diagnosis not present

## 2019-11-11 DIAGNOSIS — M79671 Pain in right foot: Secondary | ICD-10-CM | POA: Insufficient documentation

## 2019-11-11 DIAGNOSIS — F419 Anxiety disorder, unspecified: Secondary | ICD-10-CM

## 2019-11-11 DIAGNOSIS — R5383 Other fatigue: Secondary | ICD-10-CM | POA: Insufficient documentation

## 2019-11-11 DIAGNOSIS — G4733 Obstructive sleep apnea (adult) (pediatric): Secondary | ICD-10-CM | POA: Diagnosis not present

## 2019-11-11 DIAGNOSIS — R2689 Other abnormalities of gait and mobility: Secondary | ICD-10-CM | POA: Diagnosis not present

## 2019-11-11 DIAGNOSIS — F3132 Bipolar disorder, current episode depressed, moderate: Secondary | ICD-10-CM | POA: Insufficient documentation

## 2019-11-11 DIAGNOSIS — F32A Depression, unspecified: Secondary | ICD-10-CM

## 2019-11-11 MED ORDER — METOPROLOL SUCCINATE ER 25 MG PO TB24
25.0000 mg | ORAL_TABLET | Freq: Every day | ORAL | 3 refills | Status: DC
Start: 2019-11-11 — End: 2020-06-23

## 2019-11-11 MED ORDER — OXYTROL 3.9 MG/24HR TD PTTW: 1.0000 | MEDICATED_PATCH | TRANSDERMAL | 2 refills | Status: DC

## 2019-11-11 MED ORDER — GABAPENTIN 300 MG PO CAPS
300.0000 mg | ORAL_CAPSULE | Freq: Three times a day (TID) | ORAL | 0 refills | Status: DC
Start: 2019-11-11 — End: 2020-01-14

## 2019-11-11 MED ORDER — HYDROCHLOROTHIAZIDE 25 MG PO TABS
25.0000 mg | ORAL_TABLET | Freq: Every day | ORAL | 3 refills | Status: DC
Start: 2019-11-11 — End: 2020-06-23

## 2019-11-11 NOTE — Patient Instructions (Signed)
No medication changes, refills sent to your pharmacy  Return 4 months for Dr Delford Field  Dr Delford Field will let you know when the [employer] covid vaccine booster is available

## 2019-11-11 NOTE — Assessment & Plan Note (Signed)
Will give refills on Oxytrol patch

## 2019-11-11 NOTE — Assessment & Plan Note (Signed)
Continue topical lidocaine cream

## 2019-11-11 NOTE — Telephone Encounter (Signed)
PT inquiring about Housing Help. Please advise and thank you

## 2019-11-11 NOTE — Assessment & Plan Note (Signed)
Essential hypertension under good control will continue metoprolol and hydrocal thiazide refills given

## 2019-11-11 NOTE — Assessment & Plan Note (Signed)
Continue CPAP at a setting of 8 cm water pressure

## 2019-11-11 NOTE — Assessment & Plan Note (Signed)
Plan per mental health provider

## 2019-11-11 NOTE — Telephone Encounter (Signed)
Copied from CRM [id number]. Topic: General - Other >> Nov 11, 2019  1:24 PM Jaquita Rector A wrote: Reason for CRM: Patient called in to say that his Rx hydrochlorothiazide (HYDRODIURIL) 25 MG tablet has been recalled. Patient is concerned asking Dr Delford Field what is the next step. Please call Ph# [phone]

## 2019-11-11 NOTE — Assessment & Plan Note (Signed)
Prolapsed internal hemorrhoids plan per general surgery
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
## 2019-11-12 ENCOUNTER — Telehealth: Payer: Self-pay | Admitting: Critical Care Medicine

## 2019-11-12 NOTE — Telephone Encounter (Signed)
Copied from CRM [id number]. Topic: Referral - Request for Referral >> Nov 12, 2019  8:06 AM Jaquita Rector A wrote: Has patient seen PCP for this complaint? Yes.   *If NO, is insurance requiring patient see PCP for this issue before PCP can refer them? Referral for which specialty: Orthopedic Surgeon Preferred provider/office: Cathleen Fears  Reason for referral: issues with  left side of his body

## 2019-11-13 NOTE — Telephone Encounter (Signed)
I will address when i return monday

## 2019-11-13 NOTE — Telephone Encounter (Signed)
There have been recent recalls of the irbesartan-HCTZ combination products. However, pt takes single agent HCTZ. There are no active recalls on single agent HCTZ. Will forward information to patient's PCP.

## 2019-11-13 NOTE — Telephone Encounter (Signed)
Pt calling again regarding this medication recall. He is requesting to speak with nurse or PCP. Pt states that he has not taken BP medication in 3 days. Please advise.

## 2019-11-13 NOTE — Telephone Encounter (Signed)
Copied from CRM [id number]. Topic: General - Other >> Nov 11, 2019  1:24 PM Jaquita Rector A wrote: Reason for CRM: Patient called in to say that his Rx hydrochlorothiazide (HYDRODIURIL) 25 MG tablet has been recalled. Patient is concerned asking Dr Delford Field what is the next step. Please call Ph# [phone] >> Nov 12, 2019  8:03 AM Tamela Oddi wrote: Patient is calling again to see if doctor could change his BP medication because of the recall.  Please advise.

## 2019-11-13 NOTE — Telephone Encounter (Signed)
Spoke to patient and informed that the recall was with irbesartan/ HCTZ.  His medication HCTZ alone has not been recalled (per Franky Macho, Clinical Pharmacist).   Patient verbalized understanidng.

## 2019-11-17 ENCOUNTER — Ambulatory Visit: Payer: Medicaid Other | Admitting: Family Medicine

## 2019-11-20 ENCOUNTER — Telehealth: Payer: Self-pay | Admitting: Critical Care Medicine

## 2019-11-20 DIAGNOSIS — M545 Low back pain, unspecified: Secondary | ICD-10-CM

## 2019-11-20 MED ORDER — LIDOCAINE-PRILOCAINE 2.5-2.5 % EX CREA
1.0000 | TOPICAL_CREAM | CUTANEOUS | 5 refills | Status: DC | PRN
Start: 2019-11-20 — End: 2019-12-28

## 2019-11-20 NOTE — Telephone Encounter (Signed)
Pt is requesting a larger tube of medication  listed below. Pt states he is running out of cream sooner due to medication working . Please advise   Also pt is needing a new referral for back issues.     lidocaine-prilocaine (EMLA) cream [[id number]]

## 2019-11-20 NOTE — Telephone Encounter (Signed)
emla Rx sent  Referral to ortho spine sent

## 2019-11-24 ENCOUNTER — Telehealth: Payer: Self-pay

## 2019-11-24 NOTE — Telephone Encounter (Signed)
Call placed to patient regarding housing. Patient shared that he has stable housing; however, is concerned that other residents continue to leave garbage bags outside of their residence for extended periods of time. Pt reports that he has attempted to contact Leasing Department of his concerns.   Validation and support provided. Pt reports that the office has sent mail notifying residents of fines associated with leaving garbage outside of residences. Pt was informed of Legal Aid and their ability to advocate for housing concerns. He agreed to continue to reach out to Leasing and will notify LCSW when he is interested in a Legal Aid referral.   No additional concerns noted.

## 2019-11-24 NOTE — Telephone Encounter (Signed)
Call placed to [facility], spoke to Jobrick who stated that the patient has been approved for [facility] and is receiving the services through [facility].

## 2019-11-30 ENCOUNTER — Other Ambulatory Visit: Payer: Self-pay | Admitting: Critical Care Medicine

## 2019-11-30 MED ORDER — LORATADINE 10 MG PO TABS
10.0000 mg | ORAL_TABLET | Freq: Every day | ORAL | 4 refills | Status: DC
Start: 2019-11-30 — End: 2020-06-23

## 2019-11-30 NOTE — Telephone Encounter (Signed)
Medication Refill - Medication: loratadine   Has the patient contacted their pharmacy? Yes.   (Agent: If no, request that the patient contact the pharmacy for the refill.) (Agent: If yes, when and what did the pharmacy advise?)  Preferred Pharmacy (with phone number or street name):  [id number] G[place] Otto, [place] - [address] AT [facility] [address]  [address] [place] [address]  Phone: [phone] Fax: [phone]  Hours: Not open 24 hours     Agent: Please be advised that RX refills may take up to 3 business days. We ask that you follow-up with your pharmacy.

## 2019-11-30 NOTE — Telephone Encounter (Signed)
Requested Prescriptions  Pending Prescriptions Disp Refills   loratadine (CLARITIN) 10 MG tablet 30 tablet 4    Sig: Take 1 tablet (10 mg total) by mouth daily.     Ear, Nose, and Throat:  Antihistamines Passed - 11/30/2019  8:50 AM      Passed - Valid encounter within last 12 months    Recent Outpatient Visits          2 weeks ago Essential hypertension   [facility] Storm Frisk, MD   1 month ago Essential hypertension   [facility] [place], [place], NP   1 month ago Need for zoster vaccination   [facility] Lois Huxley, Cornelius Moras, RPH-CPP   3 months ago Need for vaccination for zoster   [facility] [place], RPH-CPP   4 months ago Acute bilateral low back pain without sciatica   [facility] Storm Frisk, MD

## 2019-12-01 ENCOUNTER — Encounter: Payer: Self-pay | Admitting: Family Medicine

## 2019-12-01 ENCOUNTER — Ambulatory Visit (INDEPENDENT_AMBULATORY_CARE_PROVIDER_SITE_OTHER): Payer: Medicaid Other | Admitting: Family Medicine

## 2019-12-01 ENCOUNTER — Other Ambulatory Visit: Payer: Self-pay

## 2019-12-01 DIAGNOSIS — M47817 Spondylosis without myelopathy or radiculopathy, lumbosacral region: Secondary | ICD-10-CM | POA: Diagnosis not present

## 2019-12-01 MED ORDER — BACLOFEN 10 MG PO TABS
5.0000 mg | ORAL_TABLET | Freq: Three times a day (TID) | ORAL | 3 refills | Status: DC | PRN
Start: 2019-12-01 — End: 2020-06-21
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
## 2019-12-08 ENCOUNTER — Ambulatory Visit: Payer: Medicaid Other | Admitting: Family Medicine

## 2019-12-22 ENCOUNTER — Ambulatory Visit (INDEPENDENT_AMBULATORY_CARE_PROVIDER_SITE_OTHER): Payer: Medicaid Other | Admitting: Family Medicine

## 2019-12-22 ENCOUNTER — Encounter: Payer: Self-pay | Admitting: Family Medicine

## 2019-12-22 VITALS — BP 115/77 | HR 94 | Ht 71.0 in | Wt 227.0 lb

## 2019-12-22 DIAGNOSIS — G5712 Meralgia paresthetica, left lower limb: Secondary | ICD-10-CM

## 2019-12-22 NOTE — Patient Instructions (Addendum)
Below is our plan:  We will continue current treatment plan. Continue close follow up with Dr Delford Field. You may continue gabapentin 300mg  three times daily. Check at home for Emla and Volteran (diclofenac) Cream prescribed by Dr . These creams may also help thigh pain. I recommend alternating them twice daily.   Please make sure you are staying well hydrated. I recommend 50-60 ounces daily. Well balanced diet and regular exercise encouraged.    Please continue follow up with care team as directed.   Follow up as needed   You may receive a survey regarding today's visit. I encourage you to leave honest feed back as I do use this information to improve patient care. Thank you for seeing me today!     Neuropathic Pain Neuropathic pain is pain caused by damage to the nerves that are responsible for certain sensations in your body (sensory nerves). The pain can be caused by:  Damage to the sensory nerves that send signals to your spinal cord and brain (peripheral nervous system).  Damage to the sensory nerves in your brain or spinal cord (central nervous system). Neuropathic pain can make you more sensitive to pain. Even a minor sensation can feel very painful. This is usually a long-term condition that can be difficult to treat. The type of pain differs from person to person. It may:  Start suddenly (acute), or it may develop slowly and last for a long time (chronic).  Come and go as damaged nerves heal, or it may stay at the same level for years.  Cause emotional distress, loss of sleep, and a lower quality of life. What are the causes? The most common cause of this condition is diabetes. Many other diseases and conditions can also cause neuropathic pain. Causes of neuropathic pain can be classified as:  Toxic. This is caused by medicines and chemicals. The most common cause of toxic neuropathic pain is damage from cancer treatments (chemotherapy).  Metabolic. This can be caused  by: ? Diabetes. This is the most common disease that damages the nerves. ? Lack of vitamin B from long-term alcohol abuse.  Traumatic. Any injury that cuts, crushes, or stretches a nerve can cause damage and pain. A common example is feeling pain after losing an arm or leg (phantom limb pain).  Compression-related. If a sensory nerve gets trapped or compressed for a long period of time, the blood supply to the nerve can be cut off.  Vascular. Many blood vessel diseases can cause neuropathic pain by decreasing blood supply and oxygen to nerves.  Autoimmune. This type of pain results from diseases in which the body's defense system (immune system) mistakenly attacks sensory nerves. Examples of autoimmune diseases that can cause neuropathic pain include lupus and multiple sclerosis.  Infectious. Many types of viral infections can damage sensory nerves and cause pain. Shingles infection is a common cause of this type of pain.  Inherited. Neuropathic pain can be a symptom of many diseases that are passed down through families (genetic). What increases the risk? You are more likely to develop this condition if:  You have diabetes.  You smoke.  You drink too much alcohol.  You are taking certain medicines, including medicines that kill cancer cells (chemotherapy) or that treat immune system disorders. What are the signs or symptoms? The main symptom is pain. Neuropathic pain is often described as:  Burning.  Shock-like.  Stinging.  Hot or cold.  Itching. How is this diagnosed? No single test can diagnose neuropathic pain.  It is diagnosed based on:  Physical exam and your symptoms. Your health care provider will ask you about your pain. You may be asked to use a pain scale to describe how bad your pain is.  Tests. These may be done to see if you have a high sensitivity to pain and to help find the cause and location of any sensory nerve damage. They include: ? Nerve conduction  studies to test how well nerve signals travel through your sensory nerves (electrodiagnostic testing). ? Stimulating your sensory nerves through electrodes on your skin and measuring the response in your spinal cord and brain (somatosensory evoked potential).  Imaging studies, such as: ? X-rays. ? CT scan. ? MRI. How is this treated? Treatment for neuropathic pain may change over time. You may need to try different treatment options or a combination of treatments. Some options include:  Treating the underlying cause of the neuropathy, such as diabetes, kidney disease, or vitamin deficiencies.  Stopping medicines that can cause neuropathy, such as chemotherapy.  Medicine to relieve pain. Medicines may include: ? Prescription or over-the-counter pain medicine. ? Anti-seizure medicine. ? Antidepressant medicines. ? Pain-relieving patches that are applied to painful areas of skin. ? A medicine to numb the area (local anesthetic), which can be injected as a nerve block.  Transcutaneous nerve stimulation. This uses electrical currents to block painful nerve signals. The treatment is painless.  Alternative treatments, such as: ? Acupuncture. ? Meditation. ? Massage. ? Physical therapy. ? Pain management programs. ? Counseling. Follow these instructions at home: Medicines   Take over-the-counter and prescription medicines only as told by your health care provider.  Do not drive or use heavy machinery while taking prescription pain medicine.  If you are taking prescription pain medicine, take actions to prevent or treat constipation. Your health care provider may recommend that you: ? Drink enough fluid to keep your urine pale yellow. ? Eat foods that are high in fiber, such as fresh fruits and vegetables, whole grains, and beans. ? Limit foods that are high in fat and processed sugars, such as fried or sweet foods. ? Take an over-the-counter or prescription medicine for  constipation. Lifestyle   Have a good support system at home.  Consider joining a chronic pain support group.  Do not use any products that contain nicotine or tobacco, such as cigarettes and e-cigarettes. If you need help quitting, ask your health care provider.  Do not drink alcohol. General instructions  Learn as much as you can about your condition.  Work closely with all your health care providers to find the treatment plan that works best for you.  Ask your health care provider what activities are safe for you.  Keep all follow-up visits as told by your health care provider. This is important. Contact a health care provider if:  Your pain treatments are not working.  You are having side effects from your medicines.  You are struggling with tiredness (fatigue), mood changes, depression, or anxiety. Summary  Neuropathic pain is pain caused by damage to the nerves that are responsible for certain sensations in your body (sensory nerves).  Neuropathic pain may come and go as damaged nerves heal, or it may stay at the same level for years.  Neuropathic pain is usually a long-term condition that can be difficult to treat. Consider joining a chronic pain support group. This information is not intended to replace advice given to you by your health care provider. Make sure you discuss any questions  you have with your health care provider. Document Revised: 05/01/2018 Document Reviewed: 01/25/2017 Elsevier Patient Education  2020 [employer].

## 2019-12-22 NOTE — Progress Notes (Signed)
Chief Complaint  Patient presents with  . Follow-up    corner rm- pt said he sill has numbness but the itching is better.      HISTORY OF PRESENT ILLNESS: Today 12/22/19  Jeremiah Hunter is a 59 y.o. male here today for follow up for left meralgia paresthetica. NCS/EMG was normal. No obvious causes. He is taking gabapentin 300mg  TID. He has Volteran and Emla cream on his list of medications prescribed by PCP but he is unsure if he is using these. He feels that pain is still fairly constant but itching is improving. He is trying to eat better and lost 20 pounds since 08/2019.  He continues close follow up with PCP and ortho for knee pain. He reports taking a new depression medication but is unsure of the name. He is no longer using Mychart for messaging.    HISTORY (copied from previous note)  Jeremiah Hunter is a 59 y.o. male Patient of African-American ancestry and seen here upon referralrom Dr. Cy BlamerApril Palumbo- ED. His PCP is listed as Dr. Delford FieldWright.   He went to walk- in -clinic [facility] on [address] and received a shot which did not make a difference. This was  last weekend for left leg numbness and burning, affecting the left thigh front and lateral, from hip to to knee , a patch covering the femoral cutaneus nerve.   He says the problem was the same as on 7-29, when [facility]. Jeremiah Hunter had reported that he started having numbness in his left thigh 4 days prior to his visit at the emergency room but he also saw a primary care provider on 08/18/2019 where he had a similar history given.  He denied any back pain he does not have joint pain per se knee and hip have normal range of motion.  He had no trauma that he knows of.  The numbness is improving but he had intense itching at the time no rash was seen.  The only thing that may have played a role was that he got a shingles vaccine on 08/16/2019 in his right arm but that would not affect his left thigh.  His past  medical history is positive for a benign prostate hyperplasia, essential hypertension, and he had recurrent major depression in the past sometimes with psychotic features last evaluated on 24 March 2019 he also has undergone a cystectomy and hernia repair.  The patient is single he is currently not gainfully employed.  He has never been a smoker he does not use alcohol he does not use any caffeine he drinks some sodas but not caffeinated ones.  His likes decaff green  tea . Not a coffee drinker either. I reviewed his current medications include vitamin D, see lieted meds. He couldn't afford  The listed voltaren cream, he stated. He takes Doxepine, gabapentin, depakote.   He is not DM< no Thyroid dysfunction,       REVIEW OF SYSTEMS: Out of a complete 14 system review of symptoms, the patient complains only of the following symptoms, left thigh numbness, left knee pain, anxiety/depression and all other reviewed systems are negative.   ALLERGIES: Allergies  Allergen Reactions  . Losartan Itching     HOME MEDICATIONS: Outpatient Medications Prior to Visit  Medication Sig Dispense Refill  . B Complex-C-E-Zn (EQL STRESS B-COMPLEX C/ZINC) TABS One a day po. With food.    . baclofen (LIORESAL) 10 MG tablet Take 0.5-1 tablets (  5-10 mg total) by mouth 3 (three) times daily as needed for muscle spasms. 30 each 3  . cholecalciferol (VITAMIN D3) 25 MCG (1000 UNIT) tablet Take 2,000 Units by mouth daily.    . diclofenac Sodium (VOLTAREN) 1 % GEL Apply 2 g topically 4 (four) times daily. 50 g 0  . divalproex (DEPAKOTE ER) 500 MG 24 hr tablet Take by mouth.     . doxepin (SINEQUAN) 50 MG capsule Take 2 capsules (100 mg total) by mouth at bedtime. (Patient taking differently: Take 50 mg by mouth 2 (two) times daily. ) 60 capsule 2  . gabapentin (NEURONTIN) 300 MG capsule Take 1 capsule (300 mg total) by mouth 3 (three) times daily. 90 capsule 0  . hydrochlorothiazide (HYDRODIURIL) 25 MG tablet Take 1  tablet (25 mg total) by mouth daily. 90 tablet 3  . hydrOXYzine (ATARAX/VISTARIL) 10 MG tablet Take 1 tablet (10 mg total) by mouth 3 (three) times daily as needed. 30 tablet 0  . lidocaine-prilocaine (EMLA) cream Apply 1 application topically as needed. 30 g 5  . loratadine (CLARITIN) 10 MG tablet Take 1 tablet (10 mg total) by mouth daily. 30 tablet 4  . metoprolol succinate (TOPROL-XL) 25 MG 24 hr tablet Take 1 tablet (25 mg total) by mouth daily. 90 tablet 3  . OXYTROL 3.9 MG/24HR Place 1 patch onto the skin 2 (two) times a week. 8 patch 2   No facility-administered medications prior to visit.     PAST MEDICAL HISTORY: Past Medical History:  Diagnosis Date  . Acute bilateral low back pain without sciatica 07/30/2019  . DDD (degenerative disc disease), lumbar   . Enlarged prostate   . Hypertension   . Severe episode of recurrent major depressive disorder, with psychotic features (HCC) 03/24/2019  . Sleep apnea      PAST SURGICAL HISTORY: Past Surgical History:  Procedure Laterality Date  . CYSTECTOMY    . HERNIA REPAIR       FAMILY HISTORY: Family History  Family history unknown: Yes     SOCIAL HISTORY: Social History   Socioeconomic History  . Marital status: Single    Spouse name: Not on file  . Number of children: Not on file  . Years of education: Not on file  . Highest education level: Not on file  Occupational History  . Not on file  Tobacco Use  . Smoking status: Never Smoker  . Smokeless tobacco: Never Used  Vaping Use  . Vaping Use: Never used  Substance and Sexual Activity  . Alcohol use: Never  . Drug use: Never  . Sexual activity: Not Currently  Other Topics Concern  . Not on file  Social History Narrative  . Not on file   Social Determinants of Health   Financial Resource Strain:   . Difficulty of Paying Living Expenses: Not on file  Food Insecurity:   . Worried About Programme researcher, broadcasting/film/video in the Last Year: Not on file  . Ran Out of Food  in the Last Year: Not on file  Transportation Needs:   . Lack of Transportation (Medical): Not on file  . Lack of Transportation (Non-Medical): Not on file  Physical Activity:   . Days of Exercise per Week: Not on file  . Minutes of Exercise per Session: Not on file  Stress:   . Feeling of Stress : Not on file  Social Connections:   . Frequency of Communication with Friends and Family: Not on file  . Frequency  of Social Gatherings with Friends and Family: Not on file  . Attends Religious Services: Not on file  . Active Member of Clubs or Organizations: Not on file  . Attends Banker Meetings: Not on file  . Marital Status: Not on file  Intimate Partner Violence:   . Fear of Current or Ex-Partner: Not on file  . Emotionally Abused: Not on file  . Physically Abused: Not on file  . Sexually Abused: Not on file      PHYSICAL EXAM  Vitals:   12/22/19 1309  BP: 115/77  Pulse: 94  Weight: 227 lb (103 kg)  Height: 5\' 11"  (1.803 m)   Body mass index is 31.66 kg/m.   Generalized: Well developed, in no acute distress  Cardiology: normal rate and rhythm, no murmur auscultated  Respiratory: clear to auscultation bilaterally    Neurological examination  Mentation: Alert oriented to time, place, history taking. Follows all commands speech and language fluent Cranial nerve II-XII: Pupils were equal round reactive to light. Extraocular movements were full, visual field were full on confrontational test. Facial sensation and strength were normal. Uvula tongue midline. Head turning and shoulder shrug  were normal and symmetric. Motor: The motor testing reveals 5 over 5 strength of all 4 extremities with exception of left hip flexion 4+/5 (patient reports pain today). Good symmetric motor tone is noted throughout.  Sensory: Sensory testing is intact to soft touch and pinprick testing on all 4 extremities with the exception of left anterolateral thigh (reports no sensation). No  evidence of extinction is noted.  Gait and station: Gait is stable with cane  Reflexes: Deep tendon reflexes are symmetric and normal bilaterally.     DIAGNOSTIC DATA (LABS, IMAGING, TESTING) - I reviewed patient records, labs, notes, testing and imaging myself where available.  Lab Results  Component Value Date   WBC 4.8 08/20/2019   HGB 13.4 08/20/2019   HCT 41.0 08/20/2019   MCV 88.0 08/20/2019   PLT 280 08/20/2019      Component Value Date/Time   NA 140 08/20/2019 1733   NA 139 03/24/2019 0955   K 3.8 08/20/2019 1733   CL 104 08/20/2019 1733   CO2 26 08/20/2019 1733   GLUCOSE 113 (H) 08/20/2019 1733   BUN 14 08/20/2019 1733   BUN 19 03/24/2019 0955   CREATININE 1.16 08/20/2019 1733   CALCIUM 9.4 08/20/2019 1733   PROT 7.1 08/20/2019 1733   PROT 6.6 03/24/2019 0955   ALBUMIN 3.9 08/20/2019 1733   ALBUMIN 4.2 03/24/2019 0955   AST 27 08/20/2019 1733   ALT 26 08/20/2019 1733   ALKPHOS 82 08/20/2019 1733   BILITOT 0.3 08/20/2019 1733   BILITOT 0.4 03/24/2019 0955   GFRNONAA >60 08/20/2019 1733   GFRAA >60 08/20/2019 1733   No results found for: CHOL, HDL, LDLCALC, LDLDIRECT, TRIG, CHOLHDL No results found for: 08/22/2019 No results found for: VITAMINB12 Lab Results  Component Value Date   TSH 2.170 03/24/2019      ASSESSMENT AND PLAN  59 y.o. year old male  has a past medical history of Acute bilateral low back pain without sciatica (07/30/2019), DDD (degenerative disc disease), lumbar, Enlarged prostate, Hypertension, Severe episode of recurrent major depressive disorder, with psychotic features (HCC) (03/24/2019), and Sleep apnea. here with   Meralgia paresthetica of left side  Reginal reports that symptoms seem to be improving over the past 3 months. Left thigh itching has significantly improved. Pain seems to be well managed on gabapentin  300 mg 3 times daily. I have reminded him that he may use EMLA for diclofenac cream as needed to augment gabapentin. He will  make sure that he has these medications at home. He will also call me with any new medications not listed on today's visit. He will continue close follow-up with primary care and orthopedics. Healthy lifestyle habits encouraged. He is doing very well with weight loss efforts and I have commended him today. He may follow-up with [place] as needed for worsening pain. He verbalizes understanding and agreement with this plan.  I spent 20 minutes of face-to-face and non-face-to-face time with patient.  This included previsit chart review, lab review, study review, order entry, electronic health record documentation, patient education.    Shawnie Dapper, MSN, FNP-C 12/22/2019, 1:23 PM  [facility] [address] [place], [place] [address] [phone]

## 2019-12-23 ENCOUNTER — Telehealth: Payer: Self-pay | Admitting: Family Medicine

## 2019-12-23 NOTE — Telephone Encounter (Signed)
Pt called stating that he was calling back to give the provider the name of the medication that he forgot yesterday during his visit. Fluoxetine 20 mg once a day.

## 2019-12-23 NOTE — Telephone Encounter (Signed)
Placed on med list

## 2019-12-24 ENCOUNTER — Telehealth: Payer: Self-pay

## 2019-12-24 NOTE — Telephone Encounter (Signed)
Copied from CRM [id number]. Topic: Quick Communication - See Telephone Encounter >> Dec 24, 2019  1:09 PM Aretta Nip wrote: CRM for notification. See Telephone encounter for: 12/24/19.Pt went to the neurologist and said he needed 3 scripts but does not know the names. He states they looked it up at neuro appt yesterday...explained not clinical and the nurse would return call [phone]. Pt says on the way to dentist pls call later this afternoon.   Please advise

## 2019-12-24 NOTE — Telephone Encounter (Signed)
We will discuss at OV next week.  Ask pt to bring all medications he is currently on in a bag so we can do a med reconciliation

## 2019-12-25 ENCOUNTER — Telehealth: Payer: Self-pay | Admitting: Critical Care Medicine

## 2019-12-25 NOTE — Telephone Encounter (Signed)
Medication Refill - Medication: baclofen (LIORESAL) 10 MG tablet     Preferred Pharmacy (with phone number or street name):  [facility] #[id number] Ginette Otto, [place] - [address] AT [facility] OF [address] Phone:  [phone]  Fax:  [phone]       Agent: Please be advised that RX refills may take up to 3 business days. We ask that you follow-up with your pharmacy.

## 2019-12-25 NOTE — Telephone Encounter (Signed)
Patient has refills on file at the pharmacy. They will get script ready

## 2019-12-25 NOTE — Telephone Encounter (Signed)
Copied from CRM [id number]. Topic: General - Other >> Dec 25, 2019  8:39 AM Tamela Oddi wrote: Reason for CRM: Patient would like the nurse or doctor to call him to discuss another medication cream that was suggested by his other doctor. This medication is not on his list, but he would like to start taking it, emal-bolteran cream.  Please advise and call patient to discuss at [phone] or [phone]
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
## 2019-12-28 ENCOUNTER — Ambulatory Visit: Payer: Medicaid Other | Attending: Critical Care Medicine | Admitting: Critical Care Medicine

## 2019-12-28 ENCOUNTER — Encounter: Payer: Self-pay | Admitting: Critical Care Medicine

## 2019-12-28 ENCOUNTER — Other Ambulatory Visit: Payer: Self-pay

## 2019-12-28 VITALS — BP 125/85 | HR 90 | Temp 98.4°F | Resp 20 | Wt 230.0 lb

## 2019-12-28 DIAGNOSIS — Z9989 Dependence on other enabling machines and devices: Secondary | ICD-10-CM

## 2019-12-28 DIAGNOSIS — M51369 Other intervertebral disc degeneration, lumbar region without mention of lumbar back pain or lower extremity pain: Secondary | ICD-10-CM

## 2019-12-28 DIAGNOSIS — Z7901 Long term (current) use of anticoagulants: Secondary | ICD-10-CM | POA: Diagnosis not present

## 2019-12-28 DIAGNOSIS — G8929 Other chronic pain: Secondary | ICD-10-CM | POA: Diagnosis not present

## 2019-12-28 DIAGNOSIS — F32A Depression, unspecified: Secondary | ICD-10-CM | POA: Diagnosis not present

## 2019-12-28 DIAGNOSIS — L309 Dermatitis, unspecified: Secondary | ICD-10-CM | POA: Diagnosis not present

## 2019-12-28 DIAGNOSIS — F4310 Post-traumatic stress disorder, unspecified: Secondary | ICD-10-CM | POA: Diagnosis not present

## 2019-12-28 DIAGNOSIS — K644 Residual hemorrhoidal skin tags: Secondary | ICD-10-CM | POA: Diagnosis not present

## 2019-12-28 DIAGNOSIS — G40909 Epilepsy, unspecified, not intractable, without status epilepticus: Secondary | ICD-10-CM | POA: Diagnosis not present

## 2019-12-28 DIAGNOSIS — G4733 Obstructive sleep apnea (adult) (pediatric): Secondary | ICD-10-CM | POA: Diagnosis not present

## 2019-12-28 DIAGNOSIS — Z5901 Sheltered homelessness: Secondary | ICD-10-CM | POA: Diagnosis not present

## 2019-12-28 DIAGNOSIS — Z79899 Other long term (current) drug therapy: Secondary | ICD-10-CM | POA: Insufficient documentation

## 2019-12-28 DIAGNOSIS — N3281 Overactive bladder: Secondary | ICD-10-CM

## 2019-12-28 DIAGNOSIS — I10 Essential (primary) hypertension: Secondary | ICD-10-CM | POA: Diagnosis present

## 2019-12-28 DIAGNOSIS — K648 Other hemorrhoids: Secondary | ICD-10-CM | POA: Insufficient documentation

## 2019-12-28 DIAGNOSIS — F419 Anxiety disorder, unspecified: Secondary | ICD-10-CM

## 2019-12-28 DIAGNOSIS — M549 Dorsalgia, unspecified: Secondary | ICD-10-CM | POA: Diagnosis not present

## 2019-12-28 DIAGNOSIS — M5136 Other intervertebral disc degeneration, lumbar region: Secondary | ICD-10-CM | POA: Diagnosis not present

## 2019-12-28 MED ORDER — DICLOFENAC SODIUM 1 % EX GEL: 2.0000 g | Freq: Four times a day (QID) | CUTANEOUS | 0 refills | Status: DC

## 2019-12-28 MED ORDER — LIDOCAINE-PRILOCAINE 2.5-2.5 % EX CREA
1.0000 | TOPICAL_CREAM | CUTANEOUS | 5 refills | Status: DC | PRN
Start: 2019-12-28 — End: 2020-05-02

## 2019-12-28 NOTE — Progress Notes (Signed)
Dry hands despite moisturizer  Pain 7/10 left leg and back

## 2019-12-28 NOTE — Assessment & Plan Note (Signed)
Referral back to orthopedics may patient given back exercises again continue gabapentin and baclofen as prescribed Voltaren gel given as well

## 2019-12-28 NOTE — Assessment & Plan Note (Signed)
Refill on Oxytrol given

## 2019-12-28 NOTE — Assessment & Plan Note (Signed)
Hypertension well controlled at this time we will continue hydrochlorthiazide 25 mg daily and metoprolol 25 mg daily

## 2019-12-28 NOTE — Assessment & Plan Note (Signed)
Encourage follow-up with mental health provider

## 2019-12-28 NOTE — Patient Instructions (Signed)
No change in your medications  Refills on your Voltaren and EMLA cream sent to your pharmacy  Keep your follow-ups with your mental health provider and with your orthopedic provider for your back  Consider taking men Centrum Silver 1 a day along with the omega-3 and then you can take a  B12 separate  Back exercises are as below  Return to see Dr. Delford Field 4 months   Back Exercises These exercises help to make your trunk and back strong. They also help to keep the lower back flexible. Doing these exercises can help to prevent back pain or lessen existing pain.  If you have back pain, try to do these exercises 2-3 times each day or as told by your doctor.  As you get better, do the exercises once each day. Repeat the exercises more often as told by your doctor.  To stop back pain from coming back, do the exercises once each day, or as told by your doctor. Exercises Single knee to chest Do these steps 3-5 times in a row for each leg: 1. Lie on your back on a firm bed or the floor with your legs stretched out. 2. Bring one knee to your chest. 3. Grab your knee or thigh with both hands and hold them it in place. 4. Pull on your knee until you feel a gentle stretch in your lower back or buttocks. 5. Keep doing the stretch for 10-30 seconds. 6. Slowly let go of your leg and straighten it. Pelvic tilt Do these steps 5-10 times in a row: 1. Lie on your back on a firm bed or the floor with your legs stretched out. 2. Bend your knees so they point up to the ceiling. Your feet should be flat on the floor. 3. Tighten your lower belly (abdomen) muscles to press your lower back against the floor. This will make your tailbone point up to the ceiling instead of pointing down to your feet or the floor. 4. Stay in this position for 5-10 seconds while you gently tighten your muscles and breathe evenly. Cat-cow Do these steps until your lower back bends more easily: 1. Get on your hands and knees on a  firm surface. Keep your hands under your shoulders, and keep your knees under your hips. You may put padding under your knees. 2. Let your head hang down toward your chest. Tighten (contract) the muscles in your belly. Point your tailbone toward the floor so your lower back becomes rounded like the back of a cat. 3. Stay in this position for 5 seconds. 4. Slowly lift your head. Let the muscles of your belly relax. Point your tailbone up toward the ceiling so your back forms a sagging arch like the back of a cow. 5. Stay in this position for 5 seconds.  Press-ups Do these steps 5-10 times in a row: 1. Lie on your belly (face-down) on the floor. 2. Place your hands near your head, about shoulder-width apart. 3. While you keep your back relaxed and keep your hips on the floor, slowly straighten your arms to raise the top half of your body and lift your shoulders. Do not use your back muscles. You may change where you place your hands in order to make yourself more comfortable. 4. Stay in this position for 5 seconds. 5. Slowly return to lying flat on the floor.  Bridges Do these steps 10 times in a row: 1. Lie on your back on a firm surface. 2. [place] your  knees so they point up to the ceiling. Your feet should be flat on the floor. Your arms should be flat at your sides, next to your body. 3. Tighten your butt muscles and lift your butt off the floor until your waist is almost as high as your knees. If you do not feel the muscles working in your butt and the back of your thighs, slide your feet 1-2 inches farther away from your butt. 4. Stay in this position for 3-5 seconds. 5. Slowly lower your butt to the floor, and let your butt muscles relax. If this exercise is too easy, try doing it with your arms crossed over your chest. Belly crunches Do these steps 5-10 times in a row: 1. Lie on your back on a firm bed or the floor with your legs stretched out. 2. Bend your knees so they point up to the  ceiling. Your feet should be flat on the floor. 3. Cross your arms over your chest. 4. Tip your chin a little bit toward your chest but do not bend your neck. 5. Tighten your belly muscles and slowly raise your chest just enough to lift your shoulder blades a tiny bit off of the floor. Avoid raising your body higher than that, because it can put too much stress on your low back. 6. Slowly lower your chest and your head to the floor. Back lifts Do these steps 5-10 times in a row: 1. Lie on your belly (face-down) with your arms at your sides, and rest your forehead on the floor. 2. Tighten the muscles in your legs and your butt. 3. Slowly lift your chest off of the floor while you keep your hips on the floor. Keep the back of your head in line with the curve in your back. Look at the floor while you do this. 4. Stay in this position for 3-5 seconds. 5. Slowly lower your chest and your face to the floor. Contact a doctor if:  Your back pain gets a lot worse when you do an exercise.  Your back pain does not get better 2 hours after you exercise. If you have any of these problems, stop doing the exercises. Do not do them again unless your doctor says it is okay. Get help right away if:  You have sudden, very bad back pain. If this happens, stop doing the exercises. Do not do them again unless your doctor says it is okay. This information is not intended to replace advice given to you by your health care provider. Make sure you discuss any questions you have with your health care provider. Document Revised: 10/03/2017 Document Reviewed: 10/03/2017 Elsevier Patient Education  2020 [employer].

## 2019-12-28 NOTE — Assessment & Plan Note (Signed)
-   Follow-up per general surgery 

## 2019-12-28 NOTE — Assessment & Plan Note (Signed)
Continue treatment with sleep apnea with CPAP

## 2020-01-11 ENCOUNTER — Encounter: Payer: Self-pay | Admitting: Family Medicine

## 2020-01-14 ENCOUNTER — Other Ambulatory Visit: Payer: Self-pay | Admitting: Critical Care Medicine

## 2020-01-31 ENCOUNTER — Other Ambulatory Visit: Payer: Self-pay | Admitting: Critical Care Medicine

## 2020-01-31 NOTE — Telephone Encounter (Signed)
Requested Prescriptions  Pending Prescriptions Disp Refills  . OXYTROL 3.9 MG/24HR [Pharmacy Med Name: OXYTROL 3.9MG / DAY PATCH] 8 patch 2    Sig: APPLY 1 PATCH TOPICALLY TO THE SKIN 2 TIMES A WEEK     Urology:  Bladder Agents Passed - 01/31/2020  8:01 AM      Passed - Valid encounter within last 12 months    Recent Outpatient Visits          1 month ago Essential hypertension   [facility] Storm Frisk, MD   2 months ago Essential hypertension   [facility] Storm Frisk, MD   3 months ago Essential hypertension   [facility] [place], [place], NP   3 months ago Need for zoster vaccination   [facility] Drucilla Chalet, RPH-CPP   5 months ago Need for vaccination for zoster   [facility] Drucilla Chalet, RPH-CPP

## 2020-02-04 ENCOUNTER — Ambulatory Visit: Payer: Medicaid Other | Admitting: Internal Medicine

## 2020-02-11 ENCOUNTER — Telehealth: Payer: Self-pay | Admitting: Critical Care Medicine

## 2020-02-11 NOTE — Telephone Encounter (Signed)
Copied from CRM [id number]. Topic: General - Inquiry >> Feb 10, 2020  8:54 AM Jeremiah Hunter wrote: Reason for CRM: Patient would like to know if it's okay to take olive oil, peppermint oil, honey and cinnamon daily. He had heard that it was good for the body. He can be reached at [phone]. Please advise

## 2020-02-12 NOTE — Telephone Encounter (Signed)
Call went directly to VM. Unable to leave message.

## 2020-02-17 ENCOUNTER — Encounter: Payer: Self-pay | Admitting: Internal Medicine

## 2020-02-19 ENCOUNTER — Telehealth: Payer: Self-pay | Admitting: Critical Care Medicine

## 2020-02-19 DIAGNOSIS — R21 Rash and other nonspecific skin eruption: Secondary | ICD-10-CM

## 2020-02-19 NOTE — Telephone Encounter (Signed)
Patient would like a dermatology referral because he states he has a rash on his hand.

## 2020-02-22 ENCOUNTER — Telehealth: Payer: Medicaid Other | Admitting: Physician Assistant

## 2020-02-22 NOTE — Telephone Encounter (Signed)
A referral to dermatology was made

## 2020-02-22 NOTE — Telephone Encounter (Signed)
Called pt made aware of DERM referral.

## 2020-02-25 ENCOUNTER — Other Ambulatory Visit: Payer: Self-pay | Admitting: Critical Care Medicine

## 2020-02-26 ENCOUNTER — Telehealth: Payer: Self-pay | Admitting: Critical Care Medicine

## 2020-02-26 NOTE — Telephone Encounter (Signed)
Copied from CRM [id number]. Topic: General - Other >> Feb 26, 2020  3:34 PM Lyn Hollingshead D wrote: PT requesting a refill on gel for his back pain doesn't remember the name  / please advise

## 2020-02-29 NOTE — Telephone Encounter (Signed)
diclofenac Sodium (VOLTAREN) 1 % GEL Was sent on 02/25/2020

## 2020-03-12 ENCOUNTER — Emergency Department (HOSPITAL_COMMUNITY): Payer: Medicaid Other

## 2020-03-12 ENCOUNTER — Other Ambulatory Visit: Payer: Self-pay

## 2020-03-12 ENCOUNTER — Emergency Department (HOSPITAL_COMMUNITY)
Admission: EM | Admit: 2020-03-12 | Discharge: 2020-03-12 | Disposition: A | Discharge status: Home / Self Care | Payer: Medicaid Other | Attending: Emergency Medicine | Admitting: Emergency Medicine

## 2020-03-12 DIAGNOSIS — M79622 Pain in left upper arm: Secondary | ICD-10-CM | POA: Diagnosis not present

## 2020-03-12 DIAGNOSIS — Z5321 Procedure and treatment not carried out due to patient leaving prior to being seen by health care provider: Secondary | ICD-10-CM | POA: Insufficient documentation

## 2020-03-12 NOTE — ED Triage Notes (Signed)
Pt is from home via [facility], c/o left arm pain. no injury, no CP, non radiating. Left upper arm pain, when moves or palpates, pain increased for the same. 140/90, hr 70, 100% RA.

## 2020-03-12 NOTE — ED Triage Notes (Signed)
Pt with pain in the left elbow and just above the left elbow, started about 1.5 hours ago. Denies specific injury, no swelling to the area. Tender to movement and palpation.

## 2020-03-12 NOTE — ED Notes (Signed)
Patient has left. °

## 2020-03-17 ENCOUNTER — Telehealth: Payer: Self-pay | Admitting: Critical Care Medicine

## 2020-03-17 NOTE — Telephone Encounter (Signed)
Rx sent 

## 2020-03-17 NOTE — Telephone Encounter (Signed)
Patient requesting diclofenac Sodium (VOLTAREN) 1 % GEL for back pain. Patient would like a follow up call when completed    [facility] #[id number] G[place] Otto, [place] - [address] AT [facility] OF [address] Phone:  [phone]  Fax:  [phone]

## 2020-03-18 NOTE — Progress Notes (Deleted)
10/30/19- 59 yoM never smoker for sleep evaluation courtesy of [name], NP at [facility].  Medical problem list includes Hemorrhoids, HTN, Seizure Disorder, Meralgia Paresthetica, Bipolar, Anxiety Depression,  NPSG 06/10/19- AHI 24.2/ hr, 78%, body weight today 240 lbs Body weight today- Epworth score- 2 Covid vax- Janssen Flu vax- -----hx sleep apnea, has cpap/ [employer] recently released from prison, where he was told of loud snoring and choking in his sleep. CPAP 5-10/ [employer] Download compliance 100%, AHI 1.8/ hr Using full face mask, he c/o abdominal bloating. 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  Denies ENT surgery, heart or lung disease. Sleeps better with CPAP. Still having difficulty initiating sleep.  Denies current sleep med use.   03/21/20-59 yoM never smoker followed for OSA, complicated by Hemorrhoids, HTN, Seizure Disorder, Meralgia Paresthetica, Bipolar, Anxiety Depression,  CPAP 8/ [employer] Download- Body weight today- Covid vax- Flu vax-   ROS-see HPI   + = positive Constitutional:    weight loss, night sweats, fevers, chills, fatigue, lassitude. HEENT:   ++ headaches, difficulty swallowing,+ tooth/dental problems, sore throat,       sneezing, itching, ear ache, nasal congestion, post nasal drip, snoring CV:    chest pain, orthopnea, PND, swelling in lower extremities, anasarca,                                  dizziness, palpitations Resp:   +shortness of breath with exertion or at rest.                productive cough,   non-productive cough, coughing up of blood.              change in color of mucus.  wheezing.   Skin:    rash or lesions. GI:  No-   heartburn, indigestion, abdominal pain, nausea, vomiting, diarrhea,                 change in bowel habits, loss of appetite GU: dysuria, change in color of urine, no urgency or frequency.   flank pain. MS:   joint pain, stiffness, decreased range of motion, back pain. Neuro-     nothing  unusual Psych:  change in mood or affect.  +depression or +anxiety.   memory loss.  OBJ- Physical Exam General- Alert, Oriented, Affect-appropriate, Distress- none acute Skin- rash-none, lesions- none, excoriation- none Lymphadenopathy- none Head- atraumatic            Eyes- Gross vision intact, PERRLA, conjunctivae and secretions clear            Ears- Hearing, canals-normal            Nose- Clear, no-Septal dev, mucus, polyps, erosion, perforation             Throat- Mallampati III , mucosa clear , drainage- none, tonsils- atrophic,  + teeth Neck- flexible , trachea midline, no stridor , thyroid nl, carotid no bruit Chest - symmetrical excursion , unlabored           Heart/CV- RRR , no murmur , no gallop  , no rub, nl s1 s2                           - JVD- none , edema- none, stasis changes- none, varices- none           Lung- clear to P&A, wheeze- none, cough- none ,  dullness-none, rub- none           Chest wall-  Abd-  Br/ Gen/ Rectal- Not done, not indicated Extrem- cyanosis- none, clubbing, none, atrophy- none, strength- nl Neuro- + speech slow, + sucking on tongue blade in mouth ?frontal lobe

## 2020-03-21 ENCOUNTER — Ambulatory Visit: Payer: Medicaid Other | Admitting: Internal Medicine

## 2020-03-24 ENCOUNTER — Ambulatory Visit: Payer: Medicaid Other | Admitting: Family Medicine

## 2020-03-31 ENCOUNTER — Ambulatory Visit: Payer: Medicaid Other | Admitting: Physician Assistant

## 2020-04-28 ENCOUNTER — Ambulatory Visit: Payer: Self-pay | Admitting: *Deleted

## 2020-04-28 ENCOUNTER — Telehealth: Payer: Self-pay | Admitting: Critical Care Medicine

## 2020-04-28 NOTE — Telephone Encounter (Signed)
Patient called and asked about the EMLA medication and numbness. He says that the medication is not taking the numbness away completely. He says he still has the numbness and wanted to know if he should get the refill or not. I advised I will send this to Dr. Delford Field and someone from the office will call with his recommendation. I advised if he chooses not to refill and wait on Dr. Lynelle Doctor decision, it's up to him. He says he will wait.   Summary: possible advisement    Pt called to reschedule his appt/ he also mentioned that he was prescribed lidocaine-prilocaine (EMLA) cream for nerve pain and numbness in his thigh. He is still experiencing the numbness and wanted to ask his PCP if he should continue this cream / since pt mentioned numbness in his thigh I wanted to check with NT to see if you needed to speak with this pt/ if so please advise        Reason for Disposition . [1] Caller has NON-URGENT medicine question about med that PCP prescribed AND [2] triager unable to answer question  Answer Assessment - Initial Assessment Questions 1. NAME of MEDICATION: "What medicine are you calling about?"     EMLA 2. QUESTION: "What is your question?" (e.g., medication refill, side effect)      Should I continue using it 3. PRESCRIBING HCP: "Who prescribed it?" Reason: if prescribed by specialist, call should be referred to that group.     Dr. Delford Field 4. SYMPTOMS: "Do you have any symptoms?"     No 5. SEVERITY: If symptoms are present, ask "Are they mild, moderate or severe?"     N/A 6. PREGNANCY:  "Is there any chance that you are pregnant?" "When was your last menstrual period?"     N/A  Protocols used: MEDICATION QUESTION CALL-A-AH

## 2020-04-28 NOTE — Telephone Encounter (Signed)
Pt wanted to ask Dr. Delford Field about the cream he has been using/ Pt stated that he was prescribed lidocaine-prilocaine (EMLA) cream For the nerve pain and numbness in his thigh / he is still experiencing the numbness and wants to know if he should get another tube of the cream and continue using it or if Dr. Delford Field thinks there is something else he should do / please advise

## 2020-05-02 ENCOUNTER — Telehealth: Payer: Self-pay | Admitting: *Deleted

## 2020-05-02 MED ORDER — LIDOCAINE-PRILOCAINE 2.5-2.5 % EX CREA: 1.0000 "application " | TOPICAL_CREAM | CUTANEOUS | 5 refills | Status: DC | PRN

## 2020-05-02 NOTE — Telephone Encounter (Signed)
Call placed to pt and there is no VM set up at this time.

## 2020-05-02 NOTE — Telephone Encounter (Signed)
In reference to CRM ?  I will refill EMLA cream

## 2020-05-02 NOTE — Progress Notes (Signed)
HPI M never smoker followed for OSA, complicated by  Hemorrhoids, HTN, Seizure Disorder, Meralgia Paresthetica, Bipolar, Anxiety Depression, NPSG 06/10/19- AHI 24.2/ hr, 78%, body weight today 240 lbs =======================================================  10/30/19- 59 yoM never smoker for sleep evaluation courtesy of [name], NP at [facility].  Medical problem list includes Hemorrhoids, HTN, Seizure Disorder, Meralgia Paresthetica, Bipolar, Anxiety Depression,  NPSG 06/10/19- AHI 24.2/ hr, 78%, body weight today 240 lbs Body weight today- Epworth score- 2 Covid vax- Janssen Flu vax- -----hx sleep apnea, has cpap/ [employer] recently released from prison, where he was told of loud snoring and choking in his sleep. CPAP 5-10/ [employer] Download compliance 100%, AHI 1.8/ hr Using full face mask, he c/o abdominal bloating. 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  Denies ENT surgery, heart or lung disease. Sleeps better with CPAP. Still having difficulty initiating sleep.  Denies current sleep med use.    05/03/20- 60 yoM never smoker followed for OSA, complicated by  Hemorrhoids, HTN, Seizure Disorder, Meralgia Paresthetica, Bipolar, Anxiety Depression, CPAP auto 5-10/ [employer]  ordered 10/30/19 Download- compliance 87%, AHI 1/ hr Body weight today-220 lbs Covid vax-J&J x 1 Flu vax-had Wants to discuss alternatives to CPAP. Treatment options reviewed. Does ok w CPAP but finds it frustrating.   ROS-see HPI   + = positive Constitutional:    weight loss, night sweats, fevers, chills, fatigue, lassitude. HEENT:   ++ headaches, difficulty swallowing,+ tooth/dental problems, sore throat,       sneezing, itching, ear ache, nasal congestion, post nasal drip, snoring CV:    chest pain, orthopnea, PND, swelling in lower extremities, anasarca,                                   dizziness, palpitations Resp:   +shortness of breath with exertion or at rest.                 productive cough,   non-productive cough, coughing up of blood.              change in color of mucus.  wheezing.   Skin:    rash or lesions. GI:  No-   heartburn, indigestion, abdominal pain, nausea, vomiting, diarrhea,                 change in bowel habits, loss of appetite GU: dysuria, change in color of urine, no urgency or frequency.   flank pain. MS:   joint pain, stiffness, decreased range of motion, back pain. Neuro-     nothing unusual Psych:  change in mood or affect.  +depression or +anxiety.   memory loss.  OBJ- Physical Exam General- Alert, Oriented, Affect-appropriate, Distress- none acute, + not obese Skin- rash-none, lesions- none, excoriation- none Lymphadenopathy- none Head- atraumatic            Eyes- Gross vision intact, PERRLA, conjunctivae and secretions clear            Ears- Hearing, canals-normal            Nose- Clear, no-Septal dev, mucus, polyps, erosion, perforation             Throat- Mallampati III , mucosa clear , drainage- none, tonsils- atrophic,  + teeth Neck- flexible , trachea midline, no stridor , thyroid nl, carotid no bruit Chest - symmetrical excursion , unlabored           Heart/CV- RRR ,  no murmur , no gallop  , no rub, nl s1 s2                           - JVD- none , edema- none, stasis changes- none, varices- none           Lung- clear to P&A, wheeze- none, cough- none , dullness-none, rub- none           Chest wall-  Abd-  Br/ Gen/ Rectal- Not done, not indicated Extrem- + cane Neuro- + speech slow, + sucking on tongue blade in mouth ?frontal lobe

## 2020-05-02 NOTE — Telephone Encounter (Signed)
Patient requesting EMLA refill for the left thigh numbness/tingling. Has refills on file at the pharmacy.

## 2020-05-02 NOTE — Telephone Encounter (Signed)
Will route to PCP for review. 

## 2020-05-03 ENCOUNTER — Other Ambulatory Visit: Payer: Self-pay

## 2020-05-03 ENCOUNTER — Encounter: Payer: Self-pay | Admitting: Internal Medicine

## 2020-05-03 ENCOUNTER — Ambulatory Visit (INDEPENDENT_AMBULATORY_CARE_PROVIDER_SITE_OTHER): Payer: Medicaid Other | Admitting: Internal Medicine

## 2020-05-03 VITALS — BP 116/72 | HR 71 | Temp 97.4°F | Ht 71.0 in | Wt 220.6 lb

## 2020-05-03 DIAGNOSIS — G4733 Obstructive sleep apnea (adult) (pediatric): Secondary | ICD-10-CM | POA: Diagnosis not present

## 2020-05-03 DIAGNOSIS — F3132 Bipolar disorder, current episode depressed, moderate: Secondary | ICD-10-CM

## 2020-05-03 DIAGNOSIS — Z9989 Dependence on other enabling machines and devices: Secondary | ICD-10-CM

## 2020-05-03 NOTE — Patient Instructions (Signed)
Order DME [employer]- Please change auto range to 5-12, continue mask of choice, humidifier, supplies, Airview/ card  Order- referral to Dr Myrtis Ser, Orthodontist    Consider oral appliance for OSA  Order- referral to [facility]   Consider options for OSA  Please call if we can help

## 2020-05-09 ENCOUNTER — Ambulatory Visit (INDEPENDENT_AMBULATORY_CARE_PROVIDER_SITE_OTHER): Payer: Medicaid Other | Admitting: Podiatry

## 2020-05-09 ENCOUNTER — Encounter: Payer: Self-pay | Admitting: Podiatry

## 2020-05-09 ENCOUNTER — Ambulatory Visit (INDEPENDENT_AMBULATORY_CARE_PROVIDER_SITE_OTHER): Payer: Medicaid Other

## 2020-05-09 ENCOUNTER — Other Ambulatory Visit: Payer: Self-pay

## 2020-05-09 DIAGNOSIS — M205X1 Other deformities of toe(s) (acquired), right foot: Secondary | ICD-10-CM

## 2020-05-09 DIAGNOSIS — G629 Polyneuropathy, unspecified: Secondary | ICD-10-CM | POA: Diagnosis not present

## 2020-05-09 DIAGNOSIS — M779 Enthesopathy, unspecified: Secondary | ICD-10-CM | POA: Diagnosis not present

## 2020-05-09 DIAGNOSIS — M7751 Other enthesopathy of right foot: Secondary | ICD-10-CM

## 2020-05-09 MED ORDER — TRIAMCINOLONE ACETONIDE 10 MG/ML IJ SUSP
10.0000 mg | Freq: Once | INTRAMUSCULAR | Status: AC
  Administered 2020-05-09: 10 mg

## 2020-05-11 NOTE — Progress Notes (Signed)
Subjective:   Patient ID: Jeremiah Hunter, male   DOB: 60 y.o.   MRN: [id number]   HPI Patient presents stating that his big toe joint right has become inflamed and sore is also worried about lack of motion and inability to walk comfortably.  States is been doing this for a while   ROS      Objective:  Physical Exam  Neurovascular status intact with inflammation fluid within the first MPJ right and I also noted there is diminished range of motion what appears to be some spur formation around the joint surface     Assessment:  Hallux limitus condition right with structural deformity along with what appears to be acute inflammatory capsulitis     Plan:  H&P reviewed both conditions.  Today I went ahead did sterile prep around the area I injected the joint 3 mg Dexasone Kenalog 5 g Xylocaine periarticular and I then went ahead and reviewed x-rays and discussed the possibility someday for surgery but we will continue to try to hold off with this and I educated him on hallux limitus deformity structural loss of joint motion and possible surgical intervention  X-rays indicate there is not been a significant increase in spur size there does appear to be some narrowness of the joint surface with out significant spur formation

## 2020-05-20 ENCOUNTER — Ambulatory Visit (INDEPENDENT_AMBULATORY_CARE_PROVIDER_SITE_OTHER): Payer: Medicaid Other | Admitting: Family Medicine

## 2020-05-20 ENCOUNTER — Other Ambulatory Visit: Payer: Self-pay

## 2020-05-20 DIAGNOSIS — M47817 Spondylosis without myelopathy or radiculopathy, lumbosacral region: Secondary | ICD-10-CM

## 2020-05-20 NOTE — Progress Notes (Signed)
   Office Visit Note   Patient: Jeremiah Hunter           Date of Birth: 1960-08-30           MRN: [id number] Visit Date: 05/20/2020 Requested by: Storm Frisk, MD [address] [place],  [place] [address] PCP: Storm Frisk, MD  Subjective: Chief Complaint  Patient presents with  . Lower Back - Pain, Follow-up    His left thigh is still numb, but not as severe. Resume PT now? Has a back brace  x 6 weeks now - wants to make sure he is wearing it correctly. It does help. Does not want to be on medication for the back pain.    HPI: He is here for follow-up low back pain.  Since last visit he did not go to physical therapy.  He was taking medication but did not notice any difference.  He would prefer not to take medication and is now ready to start physical therapy.  He also did some research and found some [facility]ine Counts and [employer] and plans to do some of those.              ROS:   All other systems were reviewed and are negative.  Objective: Vital Signs: There were no vitals taken for this visit.  Physical Exam:  General:  Alert and oriented, in no acute distress. Pulm:  Breathing unlabored. Psy:  Normal mood, congruent affect  Low back: He has some tenderness in the midline lumbosacral spine.  No pain in the sciatic notch today.  Lower extremity strength and reflexes are normal.  Imaging: No results found.  Assessment & Plan: 1.  Lumbar spondylosis -Start physical therapy.  Home exercises as well.  Follow-up as needed.     Procedures: No procedures performed        PMFS History: Patient Active Problem List   Diagnosis Date Noted  . DDD (degenerative disc disease), lumbar   . Prolapsed internal hemorrhoids, grade 3 09/25/2019  . Meralgia paresthetica of left side 09/11/2019  . Bipolar affective disorder, currently depressed, moderate (HCC) 09/11/2019  . External hemorrhoids with complication 07/30/2019  . Overactive bladder 06/11/2019  . OSA on CPAP 03/24/2019   . Essential hypertension 03/24/2019  . Anxiety and depression 03/24/2019  . Seizure disorder (HCC) 03/24/2019   Past Medical History:  Diagnosis Date  . Acute bilateral low back pain without sciatica 07/30/2019  . DDD (degenerative disc disease), lumbar   . Enlarged prostate   . Hypertension   . Severe episode of recurrent major depressive disorder, with psychotic features (HCC) 03/24/2019  . Sleep apnea     Family History  Family history unknown: Yes    Past Surgical History:  Procedure Laterality Date  . CYSTECTOMY    . HERNIA REPAIR     Social History   Occupational History  . Not on file  Tobacco Use  . Smoking status: Never Smoker  . Smokeless tobacco: Never Used  Vaping Use  . Vaping Use: Never used  Substance and Sexual Activity  . Alcohol use: Never  . Drug use: Never  . Sexual activity: Not Currently

## 2020-06-06 ENCOUNTER — Telehealth: Payer: Self-pay | Admitting: Internal Medicine

## 2020-06-06 ENCOUNTER — Emergency Department (HOSPITAL_COMMUNITY): Payer: Medicaid Other

## 2020-06-06 ENCOUNTER — Other Ambulatory Visit: Payer: Self-pay

## 2020-06-06 ENCOUNTER — Emergency Department (HOSPITAL_COMMUNITY)
Admission: EM | Admit: 2020-06-06 | Discharge: 2020-06-06 | Disposition: A | Discharge status: Home / Self Care | Payer: Medicaid Other | Attending: Emergency Medicine | Admitting: Emergency Medicine

## 2020-06-06 DIAGNOSIS — I10 Essential (primary) hypertension: Secondary | ICD-10-CM | POA: Diagnosis not present

## 2020-06-06 DIAGNOSIS — M25511 Pain in right shoulder: Secondary | ICD-10-CM | POA: Insufficient documentation

## 2020-06-06 DIAGNOSIS — M79621 Pain in right upper arm: Secondary | ICD-10-CM | POA: Insufficient documentation

## 2020-06-06 DIAGNOSIS — Z79899 Other long term (current) drug therapy: Secondary | ICD-10-CM | POA: Insufficient documentation

## 2020-06-06 MED ORDER — DICLOFENAC SODIUM 1 % EX GEL: CUTANEOUS | 0 refills | Status: DC

## 2020-06-06 NOTE — ED Provider Notes (Signed)
[facility]-EMERGENCY DEPT Provider Note   CSN: [id number] Arrival date & time: 06/06/20  0016     History Chief Complaint  Patient presents with  . Shoulder Pain    Jeremiah Hunter is a 60 y.o. male presents to the Emergency Department complaining of gradual, persistent, progressively worsening right shoulder and humerus pain onset 2-3 weeks ago.  Patient denies inciting event, trauma or known injury.  Reports that the pain starts in his upper humerus and radiates into his right shoulder.  Reports full range of motion for the right shoulder and right arm.  Denies numbness, tingling or weakness but states that the pain is constant and nothing seems to make it better or worse.  No previous history of injury to that shoulder.  No treatments prior to arrival.  No Fever, chills, joint swelling, erythema, open wounds.  The history is provided by the patient and medical records. No language interpreter was used.       Past Medical History:  Diagnosis Date  . Acute bilateral low back pain without sciatica 07/30/2019  . DDD (degenerative disc disease), lumbar   . Enlarged prostate   . Hypertension   . Severe episode of recurrent major depressive disorder, with psychotic features (HCC) 03/24/2019  . Sleep apnea     Patient Active Problem List   Diagnosis Date Noted  . DDD (degenerative disc disease), lumbar   . Prolapsed internal hemorrhoids, grade 3 09/25/2019  . Meralgia paresthetica of left side 09/11/2019  . Bipolar affective disorder, currently depressed, moderate (HCC) 09/11/2019  . External hemorrhoids with complication 07/30/2019  . Overactive bladder 06/11/2019  . OSA on CPAP 03/24/2019  . Essential hypertension 03/24/2019  . Anxiety and depression 03/24/2019  . Seizure disorder (HCC) 03/24/2019    Past Surgical History:  Procedure Laterality Date  . CYSTECTOMY    . HERNIA REPAIR         Family History  Family history unknown: Yes    Social  History   Tobacco Use  . Smoking status: Never Smoker  . Smokeless tobacco: Never Used  Vaping Use  . Vaping Use: Never used  Substance Use Topics  . Alcohol use: Never  . Drug use: Never    Home Medications Prior to Admission medications   Medication Sig Start Date End Date Taking? Authorizing Provider  B Complex-C-E-Zn (EQL STRESS B-COMPLEX C/ZINC) TABS One a day po. With food. 09/11/19   Dohmeier, Porfirio Mylar, MD  baclofen (LIORESAL) 10 MG tablet Take 0.5-1 tablets (5-10 mg total) by mouth 3 (three) times daily as needed for muscle spasms. 12/01/19   Hilts, Casimiro Needle, MD  cholecalciferol (VITAMIN D3) 25 MCG (1000 UNIT) tablet Take 2,000 Units by mouth daily.    [provider]  diclofenac Sodium (VOLTAREN) 1 % GEL APPLY 2 GRAMS TOPICALLY TO THE AFFECTED AREA FOUR TIMES DAILY 06/06/20   Yvonne Petite, Dahlia Client, PA-C  divalproex (DEPAKOTE ER) 500 MG 24 hr tablet Take by mouth.  08/10/19   [provider]  FLUoxetine (PROZAC) 20 MG capsule Take 20 mg by mouth daily.    [provider]  gabapentin (NEURONTIN) 300 MG capsule TAKE 1 CAPSULE(300 MG) BY MOUTH THREE TIMES DAILY 01/14/20   Storm Frisk, MD  hydrochlorothiazide (HYDRODIURIL) 25 MG tablet Take 1 tablet (25 mg total) by mouth daily. 11/11/19   Storm Frisk, MD  lidocaine-prilocaine (EMLA) cream Apply 1 application topically as needed. 05/02/20   Storm Frisk, MD  loratadine (CLARITIN) 10 MG tablet  Take 1 tablet (10 mg total) by mouth daily. 11/30/19   Storm Frisk, MD  metoprolol succinate (TOPROL-XL) 25 MG 24 hr tablet Take 1 tablet (25 mg total) by mouth daily. 11/11/19   Storm Frisk, MD  Omega-3 Fatty Acids (FISH OIL) 1000 MG CAPS Take by mouth.    [provider]  OXYTROL 3.9 MG/24HR APPLY 1 PATCH TOPICALLY TO THE SKIN 2 TIMES A WEEK 01/31/20   Storm Frisk, MD  QUEtiapine (SEROQUEL) 100 MG tablet Take 100 mg by mouth at bedtime. 04/15/20   [provider]  vitamin C  (ASCORBIC ACID) 500 MG tablet Take 500 mg by mouth daily.    [provider]  zinc gluconate 50 MG tablet Take 50 mg by mouth daily.    [provider]    Allergies    Losartan  Review of Systems   Review of Systems  Constitutional: Negative for appetite change, diaphoresis, fatigue, fever and unexpected weight change.  HENT: Negative for mouth sores.   Eyes: Negative for visual disturbance.  Respiratory: Negative for cough, chest tightness, shortness of breath and wheezing.   Cardiovascular: Negative for chest pain.  Gastrointestinal: Negative for abdominal pain, constipation, diarrhea, nausea and vomiting.  Endocrine: Negative for polydipsia, polyphagia and polyuria.  Genitourinary: Negative for dysuria, frequency, hematuria and urgency.  Musculoskeletal: Positive for arthralgias and myalgias. Negative for back pain and neck stiffness.  Skin: Negative for rash.  Allergic/Immunologic: Negative for immunocompromised state.  Neurological: Negative for syncope, light-headedness and headaches.  Hematological: Does not bruise/bleed easily.  Psychiatric/Behavioral: Negative for sleep disturbance. The patient is not nervous/anxious.     Physical Exam Updated Vital Signs BP (!) 121/93 (BP Location: Right Arm)   Pulse 70   Temp 98.6 F (37 C) (Oral)   Resp 20   SpO2 98%   Physical Exam Vitals and nursing note reviewed.  Constitutional:      General: He is not in acute distress.    Appearance: He is not diaphoretic.  HENT:     Head: Normocephalic.  Eyes:     General: No scleral icterus.    Conjunctiva/sclera: Conjunctivae normal.  Cardiovascular:     Rate and Rhythm: Normal rate and regular rhythm.     Pulses: Normal pulses.          Radial pulses are 2+ on the right side and 2+ on the left side.  Pulmonary:     Effort: No tachypnea, accessory muscle usage, prolonged expiration, respiratory distress or retractions.     Breath sounds: No stridor.      Comments: Equal chest rise. No increased work of breathing. Abdominal:     General: There is no distension.     Palpations: Abdomen is soft.     Tenderness: There is no abdominal tenderness. There is no guarding or rebound.  Musculoskeletal:     Right shoulder: Normal. No swelling, effusion, tenderness or crepitus. Normal range of motion. Normal strength.     Left shoulder: Normal.     Right upper arm: Tenderness present.     Right elbow: Normal.     Left elbow: Normal.     Right forearm: Normal.     Left forearm: Normal.     Right wrist: Normal.     Left wrist: Normal.     Right hand: Normal.     Left hand: Normal.       Arms:     Cervical back: Normal range of motion.  Comments: Moves all extremities equally and without difficulty.  Skin:    General: Skin is warm and dry.     Capillary Refill: Capillary refill takes less than 2 seconds.  Neurological:     Mental Status: He is alert.     GCS: GCS eye subscore is 4. GCS verbal subscore is 5. GCS motor subscore is 6.     Comments: Speech is clear and goal oriented.  Psychiatric:        Mood and Affect: Mood normal.     ED Results / Procedures / Treatments    Radiology DG Shoulder Right  Result Date: 06/06/2020 CLINICAL DATA:  Initial evaluation for acute right shoulder pain for 3 weeks. EXAM: RIGHT SHOULDER - 2+ VIEW COMPARISON:  None. FINDINGS: No acute fracture or dislocation. Possible remotely healed right clavicular fracture noted. Mild cortical irregularity at the superolateral aspect of the right humeral head likely degenerative. Osseous mineralization normal. No visible soft tissue injury. Visualized right hemithorax grossly clear. IMPRESSION: 1. No acute osseous abnormality about the right shoulder. 2. Possible remotely healed right clavicular fracture. Electronically Signed   By: Rise Mu M.D.   On: 06/06/2020 04:38   DG Humerus Right  Result Date: 06/06/2020 CLINICAL DATA:  Initial evaluation for  acute pain for 3 weeks. EXAM: RIGHT HUMERUS - 2+ VIEW COMPARISON:  None. FINDINGS: No acute fracture or dislocation. Osseous mineralization within normal limits. No discrete osseous lesions. No visible soft tissue abnormality. Mild degenerative change noted about the shoulder and elbow. IMPRESSION: No acute osseous abnormality about the right humerus. Electronically Signed   By: Rise Mu M.D.   On: 06/06/2020 04:40    Procedures Procedures   Medications Ordered in ED Medications - No data to display  ED Course  I have reviewed the triage vital signs and the nursing notes.  Pertinent labs & imaging results that were available during my care of the patient were reviewed by me and considered in my medical decision making (see chart for details).    MDM Rules/Calculators/A&P                           Patient presents with several weeks of upper humerus and right shoulder pain.  On exam shoulder appears normal with normal range of motion and no tenderness.  Mild tenderness to palpation of the upper humerus.  No overlying skin changes or palpable deformity.  Will obtain x-rays and reassess.  Patient ambulates with a cane and is able to use a cane in his right arm/hand without difficulty.  6:14 AM Plain films are without acute abnormality.  Generative changes noted in the right shoulder.  This may be the cause of patient's pain.  No bone lesions noted.  No open wounds, skin changes to suggest cellulitis or abscess.  Will give Voltaren gel and have patient follow-up with Orthopedics for further evaluation if pain persists.  Clinical evidence of septic joint.  Patient denies IV drug use.   Final Clinical Impression(s) / ED Diagnoses Final diagnoses:  Acute pain of right shoulder    Rx / DC Orders ED Discharge Orders         Ordered    diclofenac Sodium (VOLTAREN) 1 % GEL        06/06/20 0507           Janziel Hockett, Dahlia Client, PA-C 06/06/20 0615    Gilda Crease,  MD 06/06/20 [phone]

## 2020-06-06 NOTE — Telephone Encounter (Signed)
Noted. Hope he is ok and going to able to use his CPAP. If he needs our help, please let us know.

## 2020-06-06 NOTE — Discharge Instructions (Signed)
1. Medications: Voltaren gel, usual home medications 2. Treatment: rest, drink plenty of fluids, gentle stretching 3. Follow Up: Please followup with your primary doctor and orthopedics in 1 week for further evaluation; Please return to the ER for stiffness, fevers, swelling, skin changes or other concerns.

## 2020-06-06 NOTE — Telephone Encounter (Signed)
Spoke with pt, wanted to make Jeremiah Hunter aware that he was in ED last night for an issue not related to breathing, but he did not wear his cpap because of this.   Forwarding to CY as FYI.

## 2020-06-06 NOTE — ED Triage Notes (Addendum)
Patient came by EMS complaints of R arm and shoulder pain for 3 weeks. Patient is ambulatory with cane.   No numbness or tingling

## 2020-06-09 ENCOUNTER — Ambulatory Visit: Payer: Medicaid Other | Attending: Family Medicine | Admitting: Physical Therapy

## 2020-06-09 ENCOUNTER — Other Ambulatory Visit: Payer: Self-pay

## 2020-06-09 ENCOUNTER — Encounter: Payer: Self-pay | Admitting: Physical Therapy

## 2020-06-09 DIAGNOSIS — M6281 Muscle weakness (generalized): Secondary | ICD-10-CM

## 2020-06-09 DIAGNOSIS — M5442 Lumbago with sciatica, left side: Secondary | ICD-10-CM | POA: Diagnosis not present

## 2020-06-09 DIAGNOSIS — M25652 Stiffness of left hip, not elsewhere classified: Secondary | ICD-10-CM | POA: Diagnosis present

## 2020-06-09 DIAGNOSIS — G8929 Other chronic pain: Secondary | ICD-10-CM | POA: Insufficient documentation

## 2020-06-09 DIAGNOSIS — R2689 Other abnormalities of gait and mobility: Secondary | ICD-10-CM

## 2020-06-09 DIAGNOSIS — M25651 Stiffness of right hip, not elsewhere classified: Secondary | ICD-10-CM | POA: Insufficient documentation
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
## 2020-06-09 NOTE — Patient Instructions (Signed)
Access Code: [email or web address] URL: [email or web address] Date: 06/09/2020 Prepared by: Vernon Prey April Kirstie Peri  Exercises Supine Bridge - 1 x daily - 7 x weekly - 2 sets - 10 reps Supine Posterior Pelvic Tilt - 1 x daily - 7 x weekly - 2 sets - 10 reps Hooklying Clamshell with Resistance - 1 x daily - 7 x weekly - 2 sets - 10 reps Supine Piriformis Stretch with Leg Straight - 1 x daily - 7 x weekly - 2 sets - 20-30 sec hold

## 2020-06-13 ENCOUNTER — Telehealth: Payer: Self-pay | Admitting: Critical Care Medicine

## 2020-06-13 NOTE — Telephone Encounter (Signed)
Copied from CRM [id number]. Topic: General - Other >> Jun 13, 2020  8:04 AM Jaquita Rector A wrote: Reason for CRM:  Patient called in to inquire of Dr Delford Field if its ok to take Alpha Lipoic Acid 600 MG. Say that he is looking for something for his nerve issues and would like an opinion. Please call patient at Ph# [phone]

## 2020-06-13 NOTE — Telephone Encounter (Signed)
Pt was called and informed that he can take the medication.

## 2020-06-13 NOTE — Telephone Encounter (Signed)
Will route to PCP for review. 

## 2020-06-13 NOTE — Telephone Encounter (Signed)
Yes he can take one of these per day

## 2020-06-18 ENCOUNTER — Other Ambulatory Visit: Payer: Self-pay

## 2020-06-18 ENCOUNTER — Encounter (HOSPITAL_COMMUNITY): Payer: Self-pay | Admitting: Emergency Medicine

## 2020-06-18 ENCOUNTER — Emergency Department (HOSPITAL_COMMUNITY)
Admission: EM | Admit: 2020-06-18 | Discharge: 2020-06-18 | Disposition: A | Discharge status: Home / Self Care | Payer: Medicaid Other | Attending: Emergency Medicine | Admitting: Emergency Medicine

## 2020-06-18 DIAGNOSIS — I10 Essential (primary) hypertension: Secondary | ICD-10-CM | POA: Insufficient documentation

## 2020-06-18 DIAGNOSIS — R21 Rash and other nonspecific skin eruption: Secondary | ICD-10-CM | POA: Insufficient documentation

## 2020-06-18 DIAGNOSIS — L299 Pruritus, unspecified: Secondary | ICD-10-CM | POA: Diagnosis not present

## 2020-06-18 DIAGNOSIS — Z79899 Other long term (current) drug therapy: Secondary | ICD-10-CM | POA: Insufficient documentation

## 2020-06-18 NOTE — ED Notes (Signed)
Pt brought food and refused to leave it behind.

## 2020-06-18 NOTE — ED Triage Notes (Signed)
Patient c/o itching rash to left arm and left groin since yesterday.

## 2020-06-18 NOTE — Discharge Instructions (Signed)
You may use topical itching cream or hydrocortisone cream which can be found at your local pharmacy.  Please follow-up with your primary care doctor if your symptoms do not improve.  Return to the ER for any new or worsening symptoms.

## 2020-06-18 NOTE — ED Provider Notes (Signed)
[facility]-EMERGENCY DEPT Provider Note   CSN: [id number] Arrival date & time: 06/18/20  1408     History Chief Complaint  Patient presents with  . Rash    Jeremiah Hunter is a 59 y.o. male.  HPI 60 year old male with history of hypertension, generative disc disease presents to the ER with complaints of a rash to his left antecubital area and a rash to his left groin which started yesterday.  Rash has been itchy.  He denies any changes in soaps, no new foods.  He has not been out in the woods, no known tick bites.  Denies any fevers or chills.  He was concerned that this may be a "blood clot".  Denies any chest pain, shortness of breath.  Denies any lip swelling, difficulty breathing.    Past Medical History:  Diagnosis Date  . Acute bilateral low back pain without sciatica 07/30/2019  . DDD (degenerative disc disease), lumbar   . Enlarged prostate   . Hypertension   . Severe episode of recurrent major depressive disorder, with psychotic features (HCC) 03/24/2019  . Sleep apnea     Patient Active Problem List   Diagnosis Date Noted  . DDD (degenerative disc disease), lumbar   . Prolapsed internal hemorrhoids, grade 3 09/25/2019  . Meralgia paresthetica of left side 09/11/2019  . Bipolar affective disorder, currently depressed, moderate (HCC) 09/11/2019  . External hemorrhoids with complication 07/30/2019  . Overactive bladder 06/11/2019  . OSA on CPAP 03/24/2019  . Essential hypertension 03/24/2019  . Anxiety and depression 03/24/2019  . Seizure disorder (HCC) 03/24/2019    Past Surgical History:  Procedure Laterality Date  . CYSTECTOMY    . HERNIA REPAIR         Family History  Family history unknown: Yes    Social History   Tobacco Use  . Smoking status: Never Smoker  . Smokeless tobacco: Never Used  Vaping Use  . Vaping Use: Never used  Substance Use Topics  . Alcohol use: Never  . Drug use: Never    Home Medications Prior to  Admission medications   Medication Sig Start Date End Date Taking? Authorizing Provider  B Complex-C-E-Zn (EQL STRESS B-COMPLEX C/ZINC) TABS One a day po. With food. 09/11/19   Dohmeier, Porfirio Mylar, MD  baclofen (LIORESAL) 10 MG tablet Take 0.5-1 tablets (5-10 mg total) by mouth 3 (three) times daily as needed for muscle spasms. 12/01/19   Hilts, Casimiro Needle, MD  cholecalciferol (VITAMIN D3) 25 MCG (1000 UNIT) tablet Take 2,000 Units by mouth daily.    [provider]  diclofenac Sodium (VOLTAREN) 1 % GEL APPLY 2 GRAMS TOPICALLY TO THE AFFECTED AREA FOUR TIMES DAILY 06/06/20   Muthersbaugh, Dahlia Client, PA-C  divalproex (DEPAKOTE ER) 500 MG 24 hr tablet Take by mouth.  08/10/19   [provider]  FLUoxetine (PROZAC) 20 MG capsule Take 20 mg by mouth daily.    [provider]  gabapentin (NEURONTIN) 300 MG capsule TAKE 1 CAPSULE(300 MG) BY MOUTH THREE TIMES DAILY 01/14/20   Storm Frisk, MD  hydrochlorothiazide (HYDRODIURIL) 25 MG tablet Take 1 tablet (25 mg total) by mouth daily. 11/11/19   Storm Frisk, MD  lidocaine-prilocaine (EMLA) cream Apply 1 application topically as needed. 05/02/20   Storm Frisk, MD  loratadine (CLARITIN) 10 MG tablet Take 1 tablet (10 mg total) by mouth daily. 11/30/19   Storm Frisk, MD  metoprolol succinate (TOPROL-XL) 25 MG 24 hr tablet Take 1 tablet (25 mg  total) by mouth daily. 11/11/19   Storm Frisk, MD  Omega-3 Fatty Acids (FISH OIL) 1000 MG CAPS Take by mouth.    [provider]  OXYTROL 3.9 MG/24HR APPLY 1 PATCH TOPICALLY TO THE SKIN 2 TIMES A WEEK 01/31/20   Storm Frisk, MD  QUEtiapine (SEROQUEL) 100 MG tablet Take 100 mg by mouth at bedtime. 04/15/20   [provider]  vitamin C (ASCORBIC ACID) 500 MG tablet Take 500 mg by mouth daily.    [provider]  zinc gluconate 50 MG tablet Take 50 mg by mouth daily.    [provider]    Allergies    Losartan  Review of Systems   Review of  Systems  Constitutional: Negative for fever.  HENT: Negative for trouble swallowing.   Respiratory: Negative for shortness of breath.   Skin: Positive for rash.    Physical Exam Updated Vital Signs BP 110/71   Pulse 71   Temp 98.9 F (37.2 C) (Oral)   Resp 18   SpO2 98%   Physical Exam Vitals reviewed.  Constitutional:      Appearance: Normal appearance.  HENT:     Head: Normocephalic and atraumatic.  Eyes:     General:        Right eye: No discharge.        Left eye: No discharge.     Extraocular Movements: Extraocular movements intact.     Conjunctiva/sclera: Conjunctivae normal.  Musculoskeletal:        General: No swelling. Normal range of motion.  Skin:    Comments: Left antecubital area with macular rash approximately 2 x 3 cm.  No sloughing, no central fluctuance, no warmth.  2+ radial pulses.  Left groin with 2 small raised papules, with no central fluctuance or visible discharge.  No warmth.No evidence of bullae, sloughing, blisters, pustules, no warmth, draining sinus tracts, no superficial abscesses, no vesicles, desquamation, no target lesions with dusky purpura or central bulla.  Not tender to touch.    Neurological:     General: No focal deficit present.     Mental Status: He is alert and oriented to person, place, and time.  Psychiatric:        Mood and Affect: Mood normal.        Behavior: Behavior normal.     ED Results / Procedures / Treatments   Labs (all labs ordered are listed, but only abnormal results are displayed) Labs Reviewed - No data to display  EKG None  Radiology No results found.  Procedures Procedures   Medications Ordered in ED Medications - No data to display  ED Course  I have reviewed the triage vital signs and the nursing notes.  Pertinent labs & imaging results that were available during my care of the patient were reviewed by me and considered in my medical decision making (see chart for details).    MDM  Rules/Calculators/A&P                          Rash consistent with possible contact dermatitis/small cyst in the left groin.  Evidence of abscess, not amenable to drainage.  Very low suspicion for DVT.  Patient denies any difficulty breathing or swallowing.  Pt has a patent airway without stridor and is handling secretions without difficulty; no angioedema. No blisters, no pustules, no warmth, no draining sinus tracts, no superficial abscesses, no bullous impetigo, no vesicles, no desquamation, no target  lesions with dusky purpura or a central bulla. Not tender to touch. No concern for superimposed infection. No concern for SJS, TEN, TSS, tick borne illness, syphilis or other life-threatening condition.  Patient was instructed to use topical anti-itch cream/steroids if needed.  Patient has a follow-up with his PCP next week, encouraged him to follow-up with his PCP about this.  We discussed return precautions.  He voiced understanding and is agreeable.  Stable for discharge.  Final Clinical Impression(s) / ED Diagnoses Final diagnoses:  Rash    Rx / DC Orders ED Discharge Orders    None       Mare Ferrari, PA-C 06/18/20 1600    Koleen Distance, MD 06/18/20 1724

## 2020-06-18 NOTE — ED Notes (Signed)
Patient left prior to receiving discharge paperwork. PA made aware.

## 2020-06-18 NOTE — ED Notes (Signed)
Patient ambulatory out of department with cane. Reports his ride is waiting and he has to go.

## 2020-06-21 ENCOUNTER — Other Ambulatory Visit: Payer: Self-pay | Admitting: Family Medicine

## 2020-06-22 ENCOUNTER — Ambulatory Visit: Payer: Medicaid Other | Attending: Family Medicine

## 2020-06-22 ENCOUNTER — Other Ambulatory Visit: Payer: Self-pay

## 2020-06-22 DIAGNOSIS — R278 Other lack of coordination: Secondary | ICD-10-CM | POA: Insufficient documentation

## 2020-06-22 DIAGNOSIS — M25652 Stiffness of left hip, not elsewhere classified: Secondary | ICD-10-CM | POA: Insufficient documentation

## 2020-06-22 DIAGNOSIS — R252 Cramp and spasm: Secondary | ICD-10-CM | POA: Diagnosis present

## 2020-06-22 DIAGNOSIS — R2689 Other abnormalities of gait and mobility: Secondary | ICD-10-CM

## 2020-06-22 DIAGNOSIS — G8929 Other chronic pain: Secondary | ICD-10-CM

## 2020-06-22 DIAGNOSIS — M5442 Lumbago with sciatica, left side: Secondary | ICD-10-CM | POA: Diagnosis not present

## 2020-06-22 DIAGNOSIS — M25651 Stiffness of right hip, not elsewhere classified: Secondary | ICD-10-CM | POA: Diagnosis present

## 2020-06-22 DIAGNOSIS — M6281 Muscle weakness (generalized): Secondary | ICD-10-CM | POA: Insufficient documentation

## 2020-06-22 NOTE — Therapy (Signed)
[facility] [address] [place], [place], [address] Phone: [phone]   Fax:  [phone]  Physical Therapy Treatment  Patient Details  Name: Jeremiah Hunter MRN: [id number] Date of Birth: 05-Feb-1960 Referring Provider (PT): Lavada Mesi, MD   Encounter Date: 06/22/2020   PT End of Session - 06/22/20 1123    Visit Number 2    Number of Visits 13    Date for PT Re-Evaluation 07/21/20    Authorization Type [employer] Access -- Re-auth after 3 visits    Authorization - Visit Number 0    Authorization - Number of Visits 3    PT Start Time 1130    PT Stop Time 1209    PT Time Calculation (min) 39 min    Activity Tolerance Patient tolerated treatment well    Behavior During Therapy [facility] for tasks assessed/performed           Past Medical History:  Diagnosis Date  . Acute bilateral low back pain without sciatica 07/30/2019  . DDD (degenerative disc disease), lumbar   . Enlarged prostate   . Hypertension   . Severe episode of recurrent major depressive disorder, with psychotic features (HCC) 03/24/2019  . Sleep apnea     Past Surgical History:  Procedure Laterality Date  . CYSTECTOMY    . HERNIA REPAIR      There were no vitals filed for this visit.   Subjective Assessment - 06/22/20 1133    Subjective Pt presents to PT with reports of very slight LBP. Pt has not been compliant with his HEP, as he lost the instructions from his first session. He is ready to begin PT treatment at this time.    Currently in Pain? Yes    Pain Score 1                              [facility] Adult PT Treatment/Exercise - 06/22/20 0001      Exercises   Exercises Lumbar;Knee/Hip      Lumbar Exercises: Aerobic   Nustep lvl 5 x 5 min UE/LE while taking subjective      Lumbar Exercises: Supine   Pelvic Tilt 10 reps;5 seconds    Bridge 10 reps;3 seconds   x 2     Knee/Hip Exercises: Seated   Sit to Sand 2 sets;10 reps;with  UE support      Knee/Hip Exercises: Supine   Hip Adduction Isometric 2 sets;10 reps    Other Supine Knee/Hip Exercises clamshell 2x10 red tband    Other Supine Knee/Hip Exercises march x 10 alternating                    PT Short Term Goals - 06/09/20 1514      PT SHORT TERM GOAL #1   Title Pt will be independent with initial HEP    Time 3    Period Weeks    Status New    Target Date 06/30/20      PT SHORT TERM GOAL #2   Title Pt will be able to decrease 5x STS to <15 sec to demo improved functional LE strength    Time 3    Period Weeks    Status New    Target Date 06/30/20      PT SHORT TERM GOAL #3   Title Pt will report at least 25% decrease in frequency of his back pain  Time 3    Period Weeks    Status New    Target Date 06/30/20      PT SHORT TERM GOAL #4   Title Pt will have full hip ROM    Time 3    Period Weeks    Status New    Target Date 06/30/20             PT Long Term Goals - 06/09/20 1515      PT LONG TERM GOAL #1   Title Pt will be independent with final HEP    Time 6    Period Weeks    Status New    Target Date 07/21/20      PT LONG TERM GOAL #2   Title Pt will demo bilat hip strength to at least 4+/5    Time 6    Period Weeks    Status New    Target Date 07/21/20      PT LONG TERM GOAL #3   Title Pt will return to bike riding for at least 15 min    Time 6    Period Weeks    Status New    Target Date 07/21/20      PT LONG TERM GOAL #4   Title Pt will report decrease in L lateral thigh numbness by 50%    Time 6    Period Weeks    Status New    Target Date 07/21/20                 Plan - 06/22/20 1358    Clinical Impression Statement Pt was able to complete prescribed exercises with no adverse effect. He requires frequent verbal and tactile cues for sequencing of PPT and proximal hip strengthening exercises. Pt also continues to demo proximal hip muscle weakness, contributing to decreased functional  mobility and unsteady gait. He continues to benefit from skilled PT services and should continue to be seen and progressed as tolerated.    PT Treatment/Interventions ADLs/Self Care Home Management;Electrical Stimulation;Cryotherapy;Iontophoresis 4mg /ml Dexamethasone;Moist Heat;Ultrasound;Gait training;Stair training;Functional mobility training;Therapeutic activities;Therapeutic exercise;Balance training;Neuromuscular re-education;Patient/family education;Manual techniques;Passive range of motion;Dry needling;Taping;Vasopneumatic Device;Joint Manipulations;Spinal Manipulations    PT Next Visit Plan Assess response to HEP. Hip mobs if indicated. Core and hip strengthening. Femoral nerve glides if indicated    PT Home Exercise Plan Access Code [phone]           Patient will benefit from skilled therapeutic intervention in order to improve the following deficits and impairments:  Abnormal gait,Decreased mobility,Difficulty walking,Decreased range of motion,Improper body mechanics,Decreased strength,Pain,Postural dysfunction  Visit Diagnosis: Chronic midline low back pain with left-sided sciatica  Muscle weakness (generalized)  Other abnormalities of gait and mobility  Stiffness of left hip, not elsewhere classified  Stiffness of right hip, not elsewhere classified     Problem List Patient Active Problem List   Diagnosis Date Noted  . DDD (degenerative disc disease), lumbar   . Prolapsed internal hemorrhoids, grade 3 09/25/2019  . Meralgia paresthetica of left side 09/11/2019  . Bipolar affective disorder, currently depressed, moderate (HCC) 09/11/2019  . External hemorrhoids with complication 07/30/2019  . Overactive bladder 06/11/2019  . OSA on CPAP 03/24/2019  . Essential hypertension 03/24/2019  . Anxiety and depression 03/24/2019  . Seizure disorder (HCC) 03/24/2019    05/24/2019, PT, DPT 06/22/20 5:12 PM  [facility] [address] [place], [place], [place] Phone: [phone]   Fax:  [phone]  Name:  Chinedum Vanhouten MRN: [id number] Date of Birth: 1960-09-12

## 2020-06-23 ENCOUNTER — Other Ambulatory Visit: Payer: Self-pay

## 2020-06-23 ENCOUNTER — Ambulatory Visit: Payer: Medicaid Other | Attending: Physician Assistant | Admitting: Critical Care Medicine

## 2020-06-23 ENCOUNTER — Encounter: Payer: Self-pay | Admitting: Critical Care Medicine

## 2020-06-23 VITALS — BP 119/82 | HR 82 | Temp 97.7°F | Resp 16 | Ht 70.98 in | Wt 213.6 lb

## 2020-06-23 DIAGNOSIS — G4733 Obstructive sleep apnea (adult) (pediatric): Secondary | ICD-10-CM

## 2020-06-23 DIAGNOSIS — F4310 Post-traumatic stress disorder, unspecified: Secondary | ICD-10-CM | POA: Insufficient documentation

## 2020-06-23 DIAGNOSIS — F3132 Bipolar disorder, current episode depressed, moderate: Secondary | ICD-10-CM | POA: Diagnosis not present

## 2020-06-23 DIAGNOSIS — Z1211 Encounter for screening for malignant neoplasm of colon: Secondary | ICD-10-CM

## 2020-06-23 DIAGNOSIS — M5136 Other intervertebral disc degeneration, lumbar region: Secondary | ICD-10-CM

## 2020-06-23 DIAGNOSIS — Z139 Encounter for screening, unspecified: Secondary | ICD-10-CM

## 2020-06-23 DIAGNOSIS — Z9989 Dependence on other enabling machines and devices: Secondary | ICD-10-CM

## 2020-06-23 DIAGNOSIS — G40909 Epilepsy, unspecified, not intractable, without status epilepticus: Secondary | ICD-10-CM | POA: Diagnosis not present

## 2020-06-23 DIAGNOSIS — I10 Essential (primary) hypertension: Secondary | ICD-10-CM

## 2020-06-23 DIAGNOSIS — Z79899 Other long term (current) drug therapy: Secondary | ICD-10-CM | POA: Insufficient documentation

## 2020-06-23 DIAGNOSIS — Z5901 Sheltered homelessness: Secondary | ICD-10-CM | POA: Insufficient documentation

## 2020-06-23 DIAGNOSIS — G5712 Meralgia paresthetica, left lower limb: Secondary | ICD-10-CM

## 2020-06-23 DIAGNOSIS — N3281 Overactive bladder: Secondary | ICD-10-CM | POA: Insufficient documentation

## 2020-06-23 MED ORDER — LIDOCAINE-PRILOCAINE 2.5-2.5 % EX CREA: 1.0000 "application " | TOPICAL_CREAM | CUTANEOUS | 5 refills | Status: DC | PRN

## 2020-06-23 MED ORDER — DIVALPROEX SODIUM ER 500 MG PO TB24: 500.0000 mg | ORAL_TABLET | Freq: Two times a day (BID) | ORAL | 4 refills | Status: DC

## 2020-06-23 MED ORDER — DICLOFENAC SODIUM 1 % EX GEL: CUTANEOUS | 0 refills | Status: DC

## 2020-06-23 MED ORDER — QUETIAPINE FUMARATE 100 MG PO TABS: 100.0000 mg | ORAL_TABLET | Freq: Every day | ORAL | 2 refills | Status: DC

## 2020-06-23 MED ORDER — FLUOXETINE HCL 20 MG PO CAPS: 20.0000 mg | ORAL_CAPSULE | Freq: Every day | ORAL | 2 refills | Status: DC

## 2020-06-23 MED ORDER — HYDROCHLOROTHIAZIDE 25 MG PO TABS: 25.0000 mg | ORAL_TABLET | Freq: Every day | ORAL | 3 refills | Status: DC

## 2020-06-23 MED ORDER — LORATADINE 10 MG PO TABS: 10.0000 mg | ORAL_TABLET | Freq: Every day | ORAL | 4 refills | Status: DC

## 2020-06-23 MED ORDER — OXYTROL 3.9 MG/24HR TD PTTW: MEDICATED_PATCH | TRANSDERMAL | 2 refills | Status: DC

## 2020-06-23 MED ORDER — GABAPENTIN 300 MG PO CAPS: ORAL_CAPSULE | ORAL | 0 refills | Status: DC

## 2020-06-23 NOTE — Assessment & Plan Note (Signed)
Continue Emla cream to the left thigh

## 2020-06-23 NOTE — Assessment & Plan Note (Signed)
Management per mental health provider, continue Prozac, Depakote, Seroquel

## 2020-06-23 NOTE — Progress Notes (Signed)
Wants thyroid checked Needs refill on Baclofen and Oxytrol

## 2020-06-23 NOTE — Assessment & Plan Note (Signed)
Plan to discontinue metoprolol but continue the hydrochlorothiazide for now refills given
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
## 2020-06-23 NOTE — Assessment & Plan Note (Signed)
Currently on CPAP we will continue same

## 2020-06-23 NOTE — Patient Instructions (Signed)
Stop zinc and stop vitamin C  Stop metoprolol  Refills on all your medications sent to your [facility] pharmacy, no other medication changes  Call the podiatry office for another visit with Dr. Paulla Hunter to assess your foot pain and also to get on the hygiene clinic scheduled to have your toenails cleaned and clipped  Stay on your medicines for your bladder for now months monitor how you do  To stay off of lots of extra supplements for now over-the-counter other than the ones you are currently taking  A colon cancer screening kit was given and you will go to the lab for complete set of health screening lab draws  Return to see Dr. Joya Hunter in 5 months

## 2020-06-23 NOTE — Assessment & Plan Note (Signed)
Continue topical Voltaren gel

## 2020-06-24 ENCOUNTER — Telehealth: Payer: Self-pay

## 2020-06-24 ENCOUNTER — Telehealth: Payer: Self-pay | Admitting: Critical Care Medicine

## 2020-06-24 ENCOUNTER — Ambulatory Visit: Payer: Medicaid Other

## 2020-06-24 DIAGNOSIS — M25651 Stiffness of right hip, not elsewhere classified: Secondary | ICD-10-CM

## 2020-06-24 DIAGNOSIS — M5442 Lumbago with sciatica, left side: Secondary | ICD-10-CM | POA: Diagnosis not present

## 2020-06-24 DIAGNOSIS — R2689 Other abnormalities of gait and mobility: Secondary | ICD-10-CM

## 2020-06-24 DIAGNOSIS — G8929 Other chronic pain: Secondary | ICD-10-CM

## 2020-06-24 DIAGNOSIS — R278 Other lack of coordination: Secondary | ICD-10-CM

## 2020-06-24 DIAGNOSIS — R252 Cramp and spasm: Secondary | ICD-10-CM

## 2020-06-24 DIAGNOSIS — M25652 Stiffness of left hip, not elsewhere classified: Secondary | ICD-10-CM

## 2020-06-24 DIAGNOSIS — M6281 Muscle weakness (generalized): Secondary | ICD-10-CM

## 2020-06-24 LAB — COMPREHENSIVE METABOLIC PANEL
ALT: 15 IU/L (ref 0–44)
AST: 17 IU/L (ref 0–40)
Albumin/Globulin Ratio: 1.5 (ref 1.2–2.2)
Albumin: 4.3 g/dL (ref 3.8–4.9)
Alkaline Phosphatase: 102 IU/L (ref 44–121)
BUN/Creatinine Ratio: 19 (ref 9–20)
BUN: 19 mg/dL (ref 6–24)
Bilirubin Total: 0.5 mg/dL (ref 0.0–1.2)
CO2: 25 mmol/L (ref 20–29)
Calcium: 9.9 mg/dL (ref 8.7–10.2)
Chloride: 102 mmol/L (ref 96–106)
Creatinine, Ser: 1.01 mg/dL (ref 0.76–1.27)
Globulin, Total: 2.8 g/dL (ref 1.5–4.5)
Glucose: 99 mg/dL (ref 65–99)
Potassium: 3.5 mmol/L (ref 3.5–5.2)
Sodium: 140 mmol/L (ref 134–144)
Total Protein: 7.1 g/dL (ref 6.0–8.5)
eGFR: 86 mL/min/{1.73_m2} (ref >59)

## 2020-06-24 LAB — LIPID PANEL
Chol/HDL Ratio: 3.1 ratio (ref 0.0–5.0)
Cholesterol, Total: 156 mg/dL (ref 100–199)
HDL: 50 mg/dL (ref >39)
LDL Chol Calc (NIH): 89 mg/dL (ref 0–99)
Triglycerides: 92 mg/dL (ref 0–149)
VLDL Cholesterol Cal: 17 mg/dL (ref 5–40)

## 2020-06-24 LAB — CBC WITH DIFFERENTIAL/PLATELET
Basophils Absolute: 0 10*3/uL (ref 0.0–0.2)
Basos: 1 %
EOS (ABSOLUTE): 0.1 10*3/uL (ref 0.0–0.4)
Eos: 3 %
Hematocrit: 43.5 % (ref 37.5–51.0)
Hemoglobin: 14.3 g/dL (ref 13.0–17.7)
Immature Grans (Abs): 0 10*3/uL (ref 0.0–0.1)
Immature Granulocytes: 0 %
Lymphocytes Absolute: 1.9 10*3/uL (ref 0.7–3.1)
Lymphs: 44 %
MCH: 28.8 pg (ref 26.6–33.0)
MCHC: 32.9 g/dL (ref 31.5–35.7)
MCV: 88 fL (ref 79–97)
Monocytes Absolute: 0.4 10*3/uL (ref 0.1–0.9)
Monocytes: 9 %
Neutrophils Absolute: 1.8 10*3/uL (ref 1.4–7.0)
Neutrophils: 43 %
Platelets: 218 10*3/uL (ref 150–450)
RBC: 4.97 x10E6/uL (ref 4.14–5.80)
RDW: 14.1 % (ref 11.6–15.4)
WBC: 4.3 10*3/uL (ref 3.4–10.8)

## 2020-06-24 NOTE — Telephone Encounter (Signed)
Pt is calling let dr Delford Field know his psychiatrist will be prescribing fluoxetine 20 mg and divalproex 500 mg

## 2020-06-24 NOTE — Telephone Encounter (Signed)
-----   Message from Storm Frisk, MD sent at 06/24/2020  7:34 AM EDT ----- Let pt know liver, kidney, blood counts normal.  Cholesterol is normal

## 2020-06-24 NOTE — Patient Instructions (Signed)
Access Code: [email or web address] URL: [email or web address] Date: 06/24/2020 Prepared by: Gardiner Rhyme  Exercises Supine Bridge - 1 x daily - 7 x weekly - 2 sets - 10 reps Supine Posterior Pelvic Tilt - 1 x daily - 7 x weekly - 2 sets - 10 reps Hooklying Clamshell with Resistance - 1 x daily - 7 x weekly - 2 sets - 10 reps Supine Piriformis Stretch with Leg Straight - 2 x daily - 7 x weekly - 2 sets - 20-30 sec hold Supine Single Knee to Chest Stretch - 2 x daily - 7 x weekly - 3 sets - 20-30 sec hold Supine Lower Trunk Rotation - 1 x daily - 7 x weekly - 1 sets - 10 reps - 3-5 seconds hold

## 2020-06-24 NOTE — Telephone Encounter (Signed)
Patient name and DOB has been verified Patient was informed of lab results. Patient had no questions.  

## 2020-06-24 NOTE — Therapy (Signed)
[facility] [address] [place], [place], [address] Phone: [phone]   Fax:  [phone]  Physical Therapy Treatment  Patient Details  Name: Jeremiah Hunter MRN: [id number] Date of Birth: 1960-12-04 Referring Provider (PT): Lavada Mesi, MD   Encounter Date: 06/24/2020   PT End of Session - 06/24/20 1006    Visit Number 3    Number of Visits 13    Date for PT Re-Evaluation 07/21/20    Authorization Type MCD [place] Access -- Re-auth after 3 visits    Authorization - Visit Number 0    Authorization - Number of Visits 3    PT Start Time 1010    PT Stop Time 1053    PT Time Calculation (min) 43 min    Activity Tolerance Patient tolerated treatment well    Behavior During Therapy [facility] for tasks assessed/performed           Past Medical History:  Diagnosis Date  . Acute bilateral low back pain without sciatica 07/30/2019  . DDD (degenerative disc disease), lumbar   . Enlarged prostate   . Hypertension   . Severe episode of recurrent major depressive disorder, with psychotic features (HCC) 03/24/2019  . Sleep apnea     Past Surgical History:  Procedure Laterality Date  . CYSTECTOMY    . HERNIA REPAIR      There were no vitals filed for this visit.   Subjective Assessment - 06/24/20 1016    Subjective Pt reports he's in a lot of pain today, "all the way up since yesterday". "I was hardly in pain after my last appt, haven't started my exercises yet. I was so frustrated I forgot to get the ball".    Currently in Pain? Yes    Pain Score 10-Worst pain ever    Pain Location Back    Pain Orientation Right;Left;Lower    Pain Descriptors / Indicators Aching    Pain Type Chronic pain                             [facility] Adult PT Treatment/Exercise - 06/24/20 0001      Self-Care   Self-Care Other Self-Care Comments    Other Self-Care Comments  see pt edu      Lumbar Exercises: Stretches   Single Knee to  Chest Stretch 3 reps;20 seconds;Right;Left    Double Knee to Chest Stretch 3 reps;10 seconds    Lower Trunk Rotation Limitations x10 slow      Lumbar Exercises: Aerobic   Nustep lvl 5 x 5 min UE/LE while taking subjective      Lumbar Exercises: Supine   Pelvic Tilt 10 reps;5 seconds    Pelvic Tilt Limitations difficulty remembering form; used verbal, tactile, and visual cues    Bent Knee Raise 10 reps;3 seconds    Bridge 10 reps;3 seconds   x 2   Bridge Limitations + ball squeeze      Knee/Hip Exercises: Supine   Other Supine Knee/Hip Exercises clamshell 2x10 red tband   5" holds, verbal cues for core activation and incr R hip ER/abd                 PT Education - 06/24/20 1041    Education Details added stretching to HEP to alleviate low back pain and compression, edu for when would be a good time to orient to a gym    Person(s) Educated Patient  Methods Explanation;Verbal cues;Demonstration;Tactile cues   pt using app, so no handout   Comprehension Verbalized understanding;Returned demonstration;Verbal cues required;Tactile cues required;Need further instruction            PT Short Term Goals - 06/09/20 1514      PT SHORT TERM GOAL #1   Title Pt will be independent with initial HEP    Time 3    Period Weeks    Status New    Target Date 06/30/20      PT SHORT TERM GOAL #2   Title Pt will be able to decrease 5x STS to <15 sec to demo improved functional LE strength    Time 3    Period Weeks    Status New    Target Date 06/30/20      PT SHORT TERM GOAL #3   Title Pt will report at least 25% decrease in frequency of his back pain    Time 3    Period Weeks    Status New    Target Date 06/30/20      PT SHORT TERM GOAL #4   Title Pt will have full hip ROM    Time 3    Period Weeks    Status New    Target Date 06/30/20             PT Long Term Goals - 06/09/20 1515      PT LONG TERM GOAL #1   Title Pt will be independent with final HEP    Time 6     Period Weeks    Status New    Target Date 07/21/20      PT LONG TERM GOAL #2   Title Pt will demo bilat hip strength to at least 4+/5    Time 6    Period Weeks    Status New    Target Date 07/21/20      PT LONG TERM GOAL #3   Title Pt will return to bike riding for at least 15 min    Time 6    Period Weeks    Status New    Target Date 07/21/20      PT LONG TERM GOAL #4   Title Pt will report decrease in L lateral thigh numbness by 50%    Time 6    Period Weeks    Status New    Target Date 07/21/20                 Plan - 06/24/20 1007    Clinical Impression Statement Pt presents with high levels of back pain today for unknown reason. He has not done his HEP yet, plans to start today - provided edu on importance of trying HEP when he is in pain, in order to reduce pain. Added stretching of lumbar spine to HEP with pt reporting decrease in pain to 7/10 from 10/10 when he first started. Emphasized abdominal draw in with hooklying clams, bridges, pelvic tilts, and bent knee raises today. He had a lot of difficulty performing eccentric portion of bent knee raise without letting abominals relax.    PT Treatment/Interventions ADLs/Self Care Home Management;Electrical Stimulation;Cryotherapy;Iontophoresis 4mg /ml Dexamethasone;Moist Heat;Ultrasound;Gait training;Stair training;Functional mobility training;Therapeutic activities;Therapeutic exercise;Balance training;Neuromuscular re-education;Patient/family education;Manual techniques;Passive range of motion;Dry needling;Taping;Vasopneumatic Device;Joint Manipulations;Spinal Manipulations    PT Next Visit Plan Assess response to HEP. Hip mobs if indicated. Core and hip strengthening, lumbar mobility and hip flexibility. Femoral nerve glides if indicated    PT Home  Exercise Plan Access Code [email or web address]    Consulted and Agree with Plan of Care Patient           Patient will benefit from skilled therapeutic intervention in order to  improve the following deficits and impairments:  Abnormal gait,Decreased mobility,Difficulty walking,Decreased range of motion,Improper body mechanics,Decreased strength,Pain,Postural dysfunction  Visit Diagnosis: Chronic midline low back pain with left-sided sciatica  Muscle weakness (generalized)  Other abnormalities of gait and mobility  Stiffness of left hip, not elsewhere classified  Stiffness of right hip, not elsewhere classified  Other lack of coordination  Cramp and spasm     Problem List Patient Active Problem List   Diagnosis Date Noted  . DDD (degenerative disc disease), lumbar   . Prolapsed internal hemorrhoids, grade 3 09/25/2019  . Meralgia paresthetica of left side 09/11/2019  . Bipolar affective disorder, currently depressed, moderate (HCC) 09/11/2019  . External hemorrhoids with complication 07/30/2019  . Overactive bladder 06/11/2019  . OSA on CPAP 03/24/2019  . Essential hypertension 03/24/2019  . Anxiety and depression 03/24/2019  . Seizure disorder (HCC) 03/24/2019    Marcelline Mates, PT, DPT 06/24/2020, 11:00 AM  [facility] [address] [place], [place], [address] Phone: [phone]   Fax:  [phone]  Name: Jeremiah Hunter MRN: [id number] Date of Birth: December 24, 1960

## 2020-06-28 ENCOUNTER — Other Ambulatory Visit: Payer: Self-pay

## 2020-06-28 ENCOUNTER — Ambulatory Visit: Payer: Medicaid Other | Admitting: Physical Therapy

## 2020-06-28 DIAGNOSIS — M25652 Stiffness of left hip, not elsewhere classified: Secondary | ICD-10-CM

## 2020-06-28 DIAGNOSIS — R278 Other lack of coordination: Secondary | ICD-10-CM

## 2020-06-28 DIAGNOSIS — M5442 Lumbago with sciatica, left side: Secondary | ICD-10-CM

## 2020-06-28 DIAGNOSIS — R2689 Other abnormalities of gait and mobility: Secondary | ICD-10-CM

## 2020-06-28 DIAGNOSIS — M6281 Muscle weakness (generalized): Secondary | ICD-10-CM

## 2020-06-28 DIAGNOSIS — M25651 Stiffness of right hip, not elsewhere classified: Secondary | ICD-10-CM

## 2020-06-28 DIAGNOSIS — G8929 Other chronic pain: Secondary | ICD-10-CM

## 2020-06-28 DIAGNOSIS — R252 Cramp and spasm: Secondary | ICD-10-CM

## 2020-06-28 NOTE — Therapy (Addendum)
[place], [place], [address] Phone: [phone]   Fax:  [phone]  Physical Therapy Treatment  Patient Details  Name: Jeremiah Hunter MRN: [id number] Date of Birth: [birth year] Referring Provider (PT): Eunice Blase, MD   Encounter Date: 06/28/2020   PT End of Session - 06/28/20 1139    Visit Number 4    Number of Visits 13    Date for PT Re-Evaluation 07/21/20    Authorization Type MCD [place] Access -- Re-auth after 3 visits; STill pending approval?????    Authorization - Visit Number 0    Authorization - Number of Visits 3    PT Start Time 1100    PT Stop Time 1140    PT Time Calculation (min) 40 min    Activity Tolerance Patient tolerated treatment well    Behavior During Therapy WFL for tasks assessed/performed           Past Medical History:  Diagnosis Date  . Acute bilateral low back pain without sciatica 07/30/2019  . DDD (degenerative disc disease), lumbar   . Enlarged prostate   . Hypertension   . Severe episode of recurrent major depressive disorder, with psychotic features ([place]) 03/24/2019  . Sleep apnea     Past Surgical History:  Procedure Laterality Date  . CYSTECTOMY    . HERNIA REPAIR      There were no vitals filed for this visit.   Subjective Assessment - 06/28/20 1114    Subjective Pt states he did just a few exercises. he has been trying to figure out how to use the app but got frustrated.    Pertinent History DDD, enlarged prostate, HTN, depression, sleep apnea    Limitations Walking;Standing;Lifting    How long can you sit comfortably? Varies can be hours or minutes    How long can you stand comfortably? At least half hour    How long can you walk comfortably? Walking in grocery store/shopping    Diagnostic tests MRI 08/21/19: Diffuse prominent multilevel degenerative change with 3 mm  anterolisthesis L3 on L4. No acute bony abnormality.    Patient Stated Goals Decrease  pain, increase walking, ride his bike (has not since 2020)    Currently in Pain? No/denies              [facility] PT Assessment - 06/28/20 0001      Sit to Stand   Comments 5x STS: 17 sec      AROM   Overall AROM Comments bilta hip flexion 115 in sup                         [facility] Adult PT Treatment/Exercise - 06/28/20 0001      Lumbar Exercises: Supine   Pelvic Tilt 5 seconds;20 reps    Bent Knee Raise 10 reps;3 seconds    Bridge 10 reps;5 seconds      Lumbar Exercises: Prone   Straight Leg Raise 10 reps    Other Prone Lumbar Exercises Femoral nerve glide x20    Other Prone Lumbar Exercises Plank 2x20 sec      Knee/Hip Exercises: Supine   Other Supine Knee/Hip Exercises clamshell 2x10 red tband                  PT Education - 06/28/20 1123    Education Details Reviewed how to see HEP on HEPtoGo app. Educated pt on how to use app and reviewed HEP.  Person(s) Educated Patient    Methods Explanation;Demonstration;Verbal cues;Tactile cues    Comprehension Verbalized understanding;Returned demonstration;Verbal cues required;Tactile cues required            PT Short Term Goals - 06/28/20 1134      PT SHORT TERM GOAL #1   Title Pt will be independent with initial HEP    Baseline Reviewed how to use app 06/28/20    Time 3    Period Weeks    Status On-going    Target Date 06/30/20      PT SHORT TERM GOAL #2   Title Pt will be able to decrease 5x STS to <15 sec to demo improved functional LE strength    Baseline 12 sec 06/28/20    Time 3    Period Weeks    Status Achieved    Target Date 06/30/20      PT SHORT TERM GOAL #3   Title Pt will report at least 25% decrease in frequency of his back pain    Baseline Reports no back pain 06/28/20    Time 3    Period Weeks    Status Achieved    Target Date 06/30/20      PT SHORT TERM GOAL #4   Title Pt will have full hip ROM    Time 3    Period Weeks    Status Achieved    Target Date 06/30/20              PT Long Term Goals - 06/09/20 1515      PT LONG TERM GOAL #1   Title Pt will be independent with final HEP    Time 6    Period Weeks    Status New    Target Date 07/21/20      PT LONG TERM GOAL #2   Title Pt will demo bilat hip strength to at least 4+/5    Time 6    Period Weeks    Status New    Target Date 07/21/20      PT LONG TERM GOAL #3   Title Pt will return to bike riding for at least 15 min    Time 6    Period Weeks    Status New    Target Date 07/21/20      PT LONG TERM GOAL #4   Title Pt will report decrease in L lateral thigh numbness by 50%    Time 6    Period Weeks    Status New    Target Date 07/21/20                 Plan - 06/28/20 1124    Clinical Impression Statement Pt has met all STGs. Pt with no back pain this session. Able to progress pt's core and hip strengthening. Reviewed how to use HEP2Go app for his exercises as pt forgets. Less cues required for abdominal draw/PPT this session. Reports continued L anterolateral thigh numbness -- provided femoral nerve glides. Pt would benefit from continued PT visits to meet his LTGs, improve general mobility, strength, and L lateral thigh sensation.    Personal Factors and Comorbidities Age;Time since onset of injury/illness/exacerbation;Comorbidity 1;Comorbidity 2;Comorbidity 3+    Comorbidities DDD, enlarged prostate, HTN, depression, sleep apnea    Examination-Activity Limitations Bed Mobility;Locomotion Level;Stand;Sit    Examination-Participation Restrictions Community Activity;Other;Shop    PT Treatment/Interventions ADLs/Self Care Home Management;Electrical Stimulation;Cryotherapy;Iontophoresis 73m/ml Dexamethasone;Moist Heat;Ultrasound;Gait training;Stair training;Functional mobility training;Therapeutic activities;Therapeutic exercise;Balance training;Neuromuscular re-education;Patient/family education;Manual techniques;Passive  range of motion;Dry needling;Taping;Vasopneumatic  Device;Joint Manipulations;Spinal Manipulations    PT Next Visit Plan Assess response to HEP. Hip mobs if indicated. Core and hip strengthening, lumbar mobility and hip flexibility. Femoral nerve glides.    PT Home Exercise Plan Access Code [email or web address]    Consulted and Agree with Plan of Care Patient           Patient will benefit from skilled therapeutic intervention in order to improve the following deficits and impairments:  Abnormal gait,Decreased mobility,Difficulty walking,Decreased range of motion,Improper body mechanics,Decreased strength,Pain,Postural dysfunction  Visit Diagnosis: Chronic midline low back pain with left-sided sciatica  Muscle weakness (generalized)  Other abnormalities of gait and mobility  Stiffness of left hip, not elsewhere classified  Stiffness of right hip, not elsewhere classified  Other lack of coordination  Cramp and spasm     Problem List Patient Active Problem List   Diagnosis Date Noted  . DDD (degenerative disc disease), lumbar   . Prolapsed internal hemorrhoids, grade 3 09/25/2019  . Meralgia paresthetica of left side 09/11/2019  . Bipolar affective disorder, currently depressed, moderate ([place]) 09/11/2019  . External hemorrhoids with complication 20/23/3435  . Overactive bladder 06/11/2019  . OSA on CPAP 03/24/2019  . Essential hypertension 03/24/2019  . Anxiety and depression 03/24/2019  . Seizure disorder [facility]) 03/24/2019    Flora Ratz April Ma L Brian Kocourek PT, DPT 06/28/2020, 1:26 PM  [facility] [address] [place], [place], [address] Phone: [phone]   Fax:  [phone]  Name: Jeremiah Hunter MRN: [id number] Date of Birth: May 14, 1960

## 2020-06-30 ENCOUNTER — Ambulatory Visit: Payer: Medicaid Other | Admitting: Physical Therapy

## 2020-07-02 LAB — FECAL OCCULT BLOOD, IMMUNOCHEMICAL: Fecal Occult Bld: NEGATIVE

## 2020-07-06 ENCOUNTER — Ambulatory Visit: Payer: Medicaid Other | Admitting: Physical Therapy

## 2020-07-08 ENCOUNTER — Ambulatory Visit: Payer: Self-pay | Admitting: *Deleted

## 2020-07-08 ENCOUNTER — Encounter: Payer: Medicaid Other | Admitting: Physical Therapy

## 2020-07-08 NOTE — Telephone Encounter (Signed)
Pt called in c/o having a bloody stool this morning.   "There was blood on the BM and on the toilet paper".   He has hemorrhoids and is for surgery on them once he is ready to have it done.   This has happened before when the hemorrhoids bled back in Nov. Or Dec. 2021.   He denies being dizzy, short of breath or weak.   "I feel my normal".  Denies abd pain or bloating.   He mentioned he has to strain to have a BM.   "It came out easier the morning than usual but I still had to push hard for a couple of minutes to get it loose".   He has only had the one bloody stool this morning a few minutes ago.  He does not have transportation.   "I have to call someone and see if they can take me to the doctor".   There are no appts with [facility].   The Mobile [facility] is not running because it's Friday and they don't run on Fridays.  Pt is agreeable to going to an urgent care.   He is going to call around and get someone to take him.     I instructed him to call 911 if he develops dizziness, shortness of breath, bleeding that will not stop from his rectum, weakness, breaking out in a sweat.   He verbalized understanding and was agreeable to calling 911 if he had any of these symptoms.  I forwarded my notes to [facility] for Dr. Luisa Hart Wright's information.  I sent them high priority.

## 2020-07-08 NOTE — Telephone Encounter (Signed)
Reason for Disposition  Patient sounds very sick or weak to the triager    Pt has hemorrhoids.  To have surgery eventually when he is ready.   One episode of bloody stool this morning.  Answer Assessment - Initial Assessment Questions 1. APPEARANCE of BLOOD: "What color is it?" "Is it passed separately, on the surface of the stool, or mixed in with the stool?"      A week and 1/2 ago I had a hard time having a BM.   This morning the same thing happened and it was bright red on my poop and on the toilet paper.   I do have hemorrhoids I'm to have surgery Dr.Wright aware of it.    It's been since Dec. Since I had problems with the bleeding.   2. AMOUNT: "How much blood was passed?"      First thing this morning is only time blood noticed. 3. FREQUENCY: "How many times has blood been passed with the stools?"      Once this morning  4. ONSET: "When was the blood first seen in the stools?" (Days or weeks)      Just this morning 5. DIARRHEA: "Is there also some diarrhea?" If Yes, ask: "How many diarrhea stools in the past 24 hours?"      None 6. CONSTIPATION: "Do you have constipation?" If Yes, ask: "How bad is it?"     I had to strain to have a BM this morning but I didn't have to strain as hard as usual. 7. RECURRENT SYMPTOMS: "Have you had blood in your stools before?" If Yes, ask: "When was the last time?" and "What happened that time?"      Nov. Or Dec 2021 I think 8. BLOOD THINNERS: "Do you take any blood thinners?" (e.g., Coumadin/warfarin, Pradaxa/dabigatran, aspirin)     No  9. OTHER SYMPTOMS: "Do you have any other symptoms?"  (e.g., abdomen pain, vomiting, dizziness, fever)     No abd pain or dizziness.   "I feel normal".   10. PREGNANCY: "Is there any chance you are pregnant?" "When was your last menstrual period?"       N/A  Protocols used: Rectal Bleeding-A-AH

## 2020-07-11 NOTE — Telephone Encounter (Signed)
Will route to PCP for review. 

## 2020-07-12 ENCOUNTER — Telehealth: Payer: Self-pay | Admitting: Critical Care Medicine

## 2020-07-12 NOTE — Telephone Encounter (Signed)
Patient would like PCP nurse to fax over a medical release form to him at [phone] Attention to Mingo Amber and [place]. Any questions or concerns please call patient at [phone]

## 2020-07-13 ENCOUNTER — Encounter: Payer: Medicaid Other | Admitting: Physical Therapy

## 2020-07-14 ENCOUNTER — Encounter: Payer: Self-pay | Admitting: Internal Medicine

## 2020-07-14 NOTE — Assessment & Plan Note (Signed)
Managed elsewhere. Consider how this may impact his sleep and his willingness to cope and work with CPAP.

## 2020-07-14 NOTE — Assessment & Plan Note (Signed)
Benefits from CPAP, but asks alternatives. Appropriate discussion. Plan- refer to Dr Katz/ orthodontics about possible oral appliance          Refer to ENT to discuss Inspire nerve stimulator          For now, continue CPAP auto 5-10

## 2020-07-15 ENCOUNTER — Encounter: Payer: Self-pay | Admitting: *Deleted

## 2020-07-15 ENCOUNTER — Encounter: Payer: Medicaid Other | Admitting: Physical Therapy

## 2020-07-18 ENCOUNTER — Encounter: Payer: Medicaid Other | Admitting: Physical Therapy

## 2020-07-19 NOTE — Telephone Encounter (Signed)
Called patient and provided him with the phone number to the medical records department. Patient said he would call them to get his records.

## 2020-07-19 NOTE — Telephone Encounter (Signed)
Patient called in to check status previous message say that his care team have gotten nothing yet and they are waiting on some results. Please call patient for clarification and see previous message for fax number. Pat. Ph# [phone]

## 2020-07-20 ENCOUNTER — Ambulatory Visit: Payer: Medicaid Other | Admitting: Physical Therapy

## 2020-07-20 ENCOUNTER — Other Ambulatory Visit: Payer: Self-pay | Admitting: Critical Care Medicine

## 2020-07-21 ENCOUNTER — Other Ambulatory Visit: Payer: Self-pay | Admitting: Critical Care Medicine

## 2020-07-21 ENCOUNTER — Encounter (INDEPENDENT_AMBULATORY_CARE_PROVIDER_SITE_OTHER): Payer: Self-pay | Admitting: *Deleted

## 2020-07-21 ENCOUNTER — Telehealth: Payer: Self-pay | Admitting: Critical Care Medicine

## 2020-07-21 NOTE — Telephone Encounter (Signed)
Pt seen dr Delford Field on 06-23-2020 and was given rx for hydrochlorothiazide # 90 and patient has misplaced the medication and will not be able to refill until 90 day later. Pt does not know the other bp med he was on and would like dr Delford Field to call in the other bp he took him off of. Patient said the medication starts with ?Me. [facility] [address]. Pt also would like a refill on oxytrol and diclofenac gel

## 2020-07-22 ENCOUNTER — Encounter: Payer: Medicaid Other | Admitting: Physical Therapy

## 2020-07-22 NOTE — Telephone Encounter (Signed)
Pt called and stated he found his hydrochlorothiazide medication/ Pt also stated he is taking the Emerge C vitamin pack and wants to know what other vitamins would be beneficial for him to take/ please advise

## 2020-07-26 ENCOUNTER — Encounter: Payer: Medicaid Other | Admitting: Physical Therapy

## 2020-07-28 ENCOUNTER — Ambulatory Visit: Payer: Medicaid Other

## 2020-08-02 ENCOUNTER — Encounter: Payer: Medicaid Other | Admitting: Physical Therapy

## 2020-08-04 ENCOUNTER — Encounter: Payer: Self-pay | Admitting: Physician Assistant

## 2020-08-04 ENCOUNTER — Encounter: Payer: Medicaid Other | Admitting: Physical Therapy

## 2020-08-04 ENCOUNTER — Encounter: Payer: Self-pay | Admitting: Family Medicine

## 2020-08-04 DIAGNOSIS — N3281 Overactive bladder: Secondary | ICD-10-CM

## 2020-08-05 ENCOUNTER — Encounter: Payer: Self-pay | Admitting: Family Medicine

## 2020-08-05 DIAGNOSIS — M47817 Spondylosis without myelopathy or radiculopathy, lumbosacral region: Secondary | ICD-10-CM

## 2020-08-07 ENCOUNTER — Encounter: Payer: Self-pay | Admitting: Physical Therapy

## 2020-08-08 ENCOUNTER — Encounter: Payer: Self-pay | Admitting: Critical Care Medicine

## 2020-08-08 ENCOUNTER — Encounter: Payer: Self-pay | Admitting: Physical Therapy

## 2020-08-08 ENCOUNTER — Encounter: Payer: Self-pay | Admitting: Podiatry

## 2020-08-08 ENCOUNTER — Other Ambulatory Visit: Payer: Self-pay

## 2020-08-08 ENCOUNTER — Ambulatory Visit: Payer: Medicaid Other | Attending: Critical Care Medicine | Admitting: Critical Care Medicine

## 2020-08-08 DIAGNOSIS — M5136 Other intervertebral disc degeneration, lumbar region: Secondary | ICD-10-CM | POA: Diagnosis not present

## 2020-08-08 DIAGNOSIS — F3130 Bipolar disorder, current episode depressed, mild or moderate severity, unspecified: Secondary | ICD-10-CM | POA: Insufficient documentation

## 2020-08-08 DIAGNOSIS — N401 Enlarged prostate with lower urinary tract symptoms: Secondary | ICD-10-CM | POA: Insufficient documentation

## 2020-08-08 DIAGNOSIS — G40909 Epilepsy, unspecified, not intractable, without status epilepticus: Secondary | ICD-10-CM | POA: Diagnosis not present

## 2020-08-08 DIAGNOSIS — I10 Essential (primary) hypertension: Secondary | ICD-10-CM | POA: Diagnosis present

## 2020-08-08 DIAGNOSIS — Z79899 Other long term (current) drug therapy: Secondary | ICD-10-CM | POA: Insufficient documentation

## 2020-08-08 DIAGNOSIS — N3281 Overactive bladder: Secondary | ICD-10-CM | POA: Diagnosis not present

## 2020-08-08 DIAGNOSIS — Z8719 Personal history of other diseases of the digestive system: Secondary | ICD-10-CM | POA: Diagnosis not present

## 2020-08-08 DIAGNOSIS — G4733 Obstructive sleep apnea (adult) (pediatric): Secondary | ICD-10-CM | POA: Diagnosis not present

## 2020-08-08 DIAGNOSIS — K644 Residual hemorrhoidal skin tags: Secondary | ICD-10-CM | POA: Diagnosis not present

## 2020-08-08 DIAGNOSIS — F3132 Bipolar disorder, current episode depressed, moderate: Secondary | ICD-10-CM

## 2020-08-08 NOTE — Assessment & Plan Note (Signed)
Stable on HCTZ. Dietary intake is high in sodium but exacerbated by financial situation. Hopefully he can get a source of transportation so he can get access to healthier fruits and vegetables. He does not have a cuff but a home health nurse comes every week to check. I encouraged 3-4 twelve oz bottles of water per day.

## 2020-08-08 NOTE — Assessment & Plan Note (Signed)
Currently resolved but flairs intermittently with rectal pain and bright red blood per rectum. Educated patient of the importance of avoiding constipation and prolonged sitting on toilet. He has as needed medication to help with hemorrhoid flairs. Will give handout on constipation and fiber.

## 2020-08-08 NOTE — Assessment & Plan Note (Signed)
Stable. He is currently comfortable and pain free. Continue topical voltaren and PO baclofen. Ortho is considering MRI of lumbar spine or intra-spinal injections as next step.

## 2020-08-08 NOTE — Assessment & Plan Note (Signed)
Seroquel and prozac managed by psych. He was briefly distraught while discussing a traumatc experience in prison but was able to move on. He is isolated, lives alone and does not have reliable transportation.

## 2020-08-08 NOTE — Assessment & Plan Note (Signed)
Depakote managed by specialist. Pt has been seizure free
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
## 2020-08-08 NOTE — Telephone Encounter (Signed)
Routing to Dr. Maple Hudson as Lorain Childes. Nothing further needed at this time.

## 2020-08-08 NOTE — Assessment & Plan Note (Signed)
Continues to sleep with CPAP. No changes were made today. Continue CPAP

## 2020-08-08 NOTE — Assessment & Plan Note (Signed)
Still with incontinence Depends on order throught [facility]

## 2020-08-10 ENCOUNTER — Encounter: Payer: Self-pay | Admitting: Podiatry

## 2020-08-10 ENCOUNTER — Other Ambulatory Visit: Payer: Self-pay | Admitting: *Deleted

## 2020-08-10 DIAGNOSIS — G4733 Obstructive sleep apnea (adult) (pediatric): Secondary | ICD-10-CM

## 2020-08-10 DIAGNOSIS — Z9989 Dependence on other enabling machines and devices: Secondary | ICD-10-CM

## 2020-08-10 NOTE — Telephone Encounter (Signed)
Dr. Young, Please see patient comment and advise.  Thank you. 

## 2020-08-10 NOTE — Telephone Encounter (Signed)
It sounds as if he may be having trouble with humidifier. Please ask him to call [facility] and ask them to get him in for visit so they can service machine and understand his complaint.  Please order DME [facility]-please service CPAP machine. Patient seems to be having humidifier problem.

## 2020-08-12 ENCOUNTER — Telehealth: Payer: Self-pay

## 2020-08-12 NOTE — Telephone Encounter (Signed)
I am having trouble following conversation train to see what kind of problem he is having. Ok for him to sleep off CPAP until [facility] can service it. Have him let us know if they can't take care of his concern.

## 2020-08-12 NOTE — Telephone Encounter (Signed)
Dr. Maple Hudson, please advise on mychart message below, thanks!   I decided not to sleep with the machine until [facility] check it out I hope this is ok

## 2020-08-12 NOTE — Telephone Encounter (Signed)
Dr. Maple Hudson, please advise on mychart message below, thanks!  Could I come in for appointment on Thursday July 28  or August 2 after 2:30pm or on August 9 or August 16 or August 18 or August 23 or August 25. August 30. September 1 or September 6 after 1 pm or any Tuesday or Thursday in September

## 2020-08-12 NOTE — Telephone Encounter (Signed)
Orders has been signed and sent to [facility].

## 2020-08-12 NOTE — Telephone Encounter (Signed)
I called and spoke with the pt  He was made aware that he already has his 6 month rov with Dr Maple Hudson on 11/01/20 at 11 am  He will call sooner if needed  He spoke with [facility] and they gave him tips on how to troubleshoot his CPAP issue  He is aware to call them Monday 7/25 if still has any issues with it  Per pt nothing further needed at this time

## 2020-08-14 NOTE — Telephone Encounter (Signed)
FYI for Dr. Maple Hudson:  08/12/20 I fell asleep without the machine, this has happened few times but I had always woke up later realize I didn't have the machine on my face but maybe last night because I stay up after 3 am and I'm used to probably be sleep near somewhere near 12 am midnight just saying why I didn't use the machine last night

## 2020-08-16 ENCOUNTER — Ambulatory Visit: Payer: Medicaid Other | Attending: Family Medicine

## 2020-08-16 ENCOUNTER — Other Ambulatory Visit: Payer: Self-pay

## 2020-08-16 DIAGNOSIS — R278 Other lack of coordination: Secondary | ICD-10-CM

## 2020-08-16 DIAGNOSIS — M6281 Muscle weakness (generalized): Secondary | ICD-10-CM | POA: Insufficient documentation

## 2020-08-16 DIAGNOSIS — M25652 Stiffness of left hip, not elsewhere classified: Secondary | ICD-10-CM

## 2020-08-16 DIAGNOSIS — G8929 Other chronic pain: Secondary | ICD-10-CM | POA: Diagnosis present

## 2020-08-16 DIAGNOSIS — M25651 Stiffness of right hip, not elsewhere classified: Secondary | ICD-10-CM | POA: Diagnosis present

## 2020-08-16 DIAGNOSIS — R2689 Other abnormalities of gait and mobility: Secondary | ICD-10-CM | POA: Insufficient documentation

## 2020-08-16 DIAGNOSIS — R252 Cramp and spasm: Secondary | ICD-10-CM | POA: Diagnosis present

## 2020-08-16 DIAGNOSIS — M5442 Lumbago with sciatica, left side: Secondary | ICD-10-CM | POA: Insufficient documentation

## 2020-08-16 NOTE — Therapy (Signed)
[facility] [address] [place], [place], [address] Phone: [phone]   Fax:  [phone]  Physical Therapy Treatment/Re-Evaluation  Patient Details  Name: Jeremiah Hunter MRN: [id number] Date of Birth: Mar 25, 1960 Referring Provider (PT): Lavada Mesi, MD   Encounter Date: 08/16/2020   PT End of Session - 08/16/20 1253     Visit Number 5    Number of Visits 13    Date for PT Re-Evaluation 10/11/20    Authorization Type [employer] [place] Access    Authorization Time Period initial auth submitted    PT Start Time 1210    PT Stop Time 1257    PT Time Calculation (min) 47 min    Activity Tolerance Patient tolerated treatment well    Behavior During Therapy [facility] for tasks assessed/performed             Past Medical History:  Diagnosis Date   Acute bilateral low back pain without sciatica 07/30/2019   DDD (degenerative disc disease), lumbar    Enlarged prostate    Hypertension    Severe episode of recurrent major depressive disorder, with psychotic features (HCC) 03/24/2019   Sleep apnea     Past Surgical History:  Procedure Laterality Date   CYSTECTOMY     HERNIA REPAIR      There were no vitals filed for this visit.   Subjective Assessment - 08/16/20 1211     Subjective Pt presents to PT with continued reports of left sided lower back pain. He states he has not been motivated to do much lately and has been feeling down. He notes that he previously got confused about insurance and PT authorization with visits and stopped coming to sessions. Pt notes that he and his MD would like him to continue now for a few visits to see if it can decrease his pain.    Pertinent History DDD, enlarged prostate, HTN, depression, sleep apnea    Limitations Walking;Standing;Lifting    How long can you sit comfortably? 30 minutes    How long can you stand comfortably? 30 minutes    How long can you walk comfortably? 10-15 minutes     Diagnostic tests MRI 08/21/19: Diffuse prominent multilevel degenerative change with 3 mm  anterolisthesis L3 on L4. No acute bony abnormality.    Patient Stated Goals Decrease pain, increase walking, ride his bike (has not since 2020)    Currently in Pain? Yes    Pain Score 8    10/10 at worst in last 2 weeks   Pain Location Back    Pain Orientation Left;Lower    Pain Descriptors / Indicators Aching    Pain Type Chronic pain    Pain Radiating Towards L LE    Pain Onset More than a month ago    Pain Frequency Constant    Aggravating Factors  prolonged sitting    Pain Relieving Factors rest                [facility] PT Assessment - 08/16/20 0001       Assessment   Medical Diagnosis M47.817 (ICD-10-CM) - Spondylosis of lumbosacral region without myelopathy or radiculopathy    Referring Provider (PT) Hilts, Michael, MD      Strength   Right Hip Flexion 4/5    Right Hip ABduction 3+/5    Right Hip ADduction 3+/5    Left Hip Flexion 4/5    Left Hip ABduction 3+/5    Left Hip ADduction 3+/5  Transfers   Five time sit to stand comments  30 seconds - increased lower back pain                           [facility] Adult PT Treatment/Exercise - 08/16/20 0001       Lumbar Exercises: Stretches   Passive Hamstring Stretch Limitations 2x30 sec seated ea    Lower Trunk Rotation Limitations x 5 ea    Other Lumbar Stretch Exercise seated fwd flexion w/ green physioball 2x10      Lumbar Exercises: Supine   Bridge Limitations 2x10 - 3 sec hold      Modalities   Modalities Moist Heat      Moist Heat Therapy   Number Minutes Moist Heat 10 Minutes    Moist Heat Location Lumbar Spine                    PT Education - 08/16/20 1253     Education Details reviewed goals, HEP, POC    Person(s) Educated Patient    Methods Explanation;Demonstration;Handout    Comprehension Verbalized understanding;Returned demonstration              PT Short Term Goals -  06/28/20 1134       PT SHORT TERM GOAL #1   Title Pt will be independent with initial HEP    Baseline Reviewed how to use app 06/28/20    Time 3    Period Weeks    Status On-going    Target Date 06/30/20      PT SHORT TERM GOAL #2   Title Pt will be able to decrease 5x STS to <15 sec to demo improved functional LE strength    Baseline 12 sec 06/28/20    Time 3    Period Weeks    Status Achieved    Target Date 06/30/20      PT SHORT TERM GOAL #3   Title Pt will report at least 25% decrease in frequency of his back pain    Baseline Reports no back pain 06/28/20    Time 3    Period Weeks    Status Achieved    Target Date 06/30/20      PT SHORT TERM GOAL #4   Title Pt will have full hip ROM    Time 3    Period Weeks    Status Achieved    Target Date 06/30/20               PT Long Term Goals - 08/16/20 1630       PT LONG TERM GOAL #1   Title Pt will be independent with final HEP    Time 8    Period Weeks    Status On-going    Target Date 10/11/20      PT LONG TERM GOAL #2   Title Pt will demo bilat hip strength to at least 4+/5    Baseline see flowsheet    Time 8    Period Weeks    Status On-going    Target Date 10/11/20      PT LONG TERM GOAL #3   Title Pt will be able to perform 30 minutes of walking a day with no increase in lower back pain    Baseline unable    Time 8    Period Weeks    Status On-going    Target Date 10/11/20  PT LONG TERM GOAL #4   Title Pt will self report LBP no greater than 3/10 at worst in order to improve comfort and function    Baseline 10/10 at worst current    Time 8    Period Weeks    Status On-going    Target Date 10/11/20      PT LONG TERM GOAL #5   Title Pt will decrease 5xSTS time to no greater than 15 seconds in order to improve balance and functional mobility    Baseline 30 seconds on 08/16/20    Time 8    Period Weeks    Status New    Target Date 10/11/20                   Plan - 08/16/20 1635      Clinical Impression Statement Pt presents to PT after approximately 6 week absence with continued reports of continued lower back pain and discomfort. Physical findings are consistent with pt's continued subjective c/o, as he has continued palpable pain to L lumbar paraspinals and reduced functional mobility, as evidenced by 5xSTS time. He would benefit from continuation of PT services after brief absence, with continued emphasis on decreasing pain by improve strength and lumbar ROM. Will assess response to change in HEP and progress as able.    Personal Factors and Comorbidities Age;Time since onset of injury/illness/exacerbation;Comorbidity 1;Comorbidity 2;Comorbidity 3+    Comorbidities DDD, enlarged prostate, HTN, depression, sleep apnea    Examination-Activity Limitations Bed Mobility;Locomotion Level;Stand;Sit    Examination-Participation Restrictions Community Activity;Other;Shop    PT Treatment/Interventions ADLs/Self Care Home Management;Electrical Stimulation;Cryotherapy;Iontophoresis 4mg /ml Dexamethasone;Moist Heat;Ultrasound;Gait training;Stair training;Functional mobility training;Therapeutic activities;Therapeutic exercise;Balance training;Neuromuscular re-education;Patient/family education;Manual techniques;Passive range of motion;Dry needling;Taping;Vasopneumatic Device;Joint Manipulations;Spinal Manipulations    PT Next Visit Plan Assess response to HEP; progress exercises as tolerated    PT Home Exercise Plan Access Code    Consulted and Agree with Plan of Care Patient             Patient will benefit from skilled therapeutic intervention in order to improve the following deficits and impairments:  Abnormal gait, Decreased mobility, Difficulty walking, Decreased range of motion, Improper body mechanics, Decreased strength, Pain, Postural dysfunction  Visit Diagnosis: Chronic midline low back pain with left-sided sciatica - Plan: PT plan of care  cert/re-cert  Muscle weakness (generalized) - Plan: PT plan of care cert/re-cert  Other abnormalities of gait and mobility - Plan: PT plan of care cert/re-cert  Stiffness of left hip, not elsewhere classified - Plan: PT plan of care cert/re-cert  Stiffness of right hip, not elsewhere classified - Plan: PT plan of care cert/re-cert  Other lack of coordination - Plan: PT plan of care cert/re-cert  Cramp and spasm - Plan: PT plan of care cert/re-cert     Problem List Patient Active Problem List   Diagnosis Date Noted   DDD (degenerative disc disease), lumbar    Prolapsed internal hemorrhoids, grade 3 09/25/2019   Meralgia paresthetica of left side 09/11/2019   Bipolar affective disorder, currently depressed, moderate (HCC) 09/11/2019   External hemorrhoids with complication 07/30/2019   Overactive bladder 06/11/2019   OSA (obstructive sleep apnea) 03/24/2019   Essential hypertension 03/24/2019   Anxiety and depression 03/24/2019   Seizure disorder (HCC) 03/24/2019    05/24/2019, PT, DPT 08/16/20 4:48 PM  [facility] [address] [place], [place], [place] Phone: [phone]   Fax:  [phone]  Name: Jeremiah Hunter MRN:  [id number] Date of Birth: 05/15/1960

## 2020-08-18 ENCOUNTER — Other Ambulatory Visit: Payer: Self-pay | Admitting: Critical Care Medicine

## 2020-08-18 NOTE — Telephone Encounter (Signed)
Future OV 09/27/20. Approved per protocol.  Requested Prescriptions  Pending Prescriptions Disp Refills  . diclofenac Sodium (VOLTAREN) 1 % GEL [Pharmacy Med Name: DICLOFENAC 1% GEL 100GM] 100 g 0    Sig: APPLY 2 GRAMS TOPICALLY TO THE AFFECTED AREA FOUR TIMES DAILY     Analgesics:  Topicals Passed - 08/18/2020  4:03 PM      Passed - Valid encounter within last 12 months    Recent Outpatient Visits          1 week ago Essential hypertension   [facility] Storm Frisk, MD   1 month ago Essential hypertension   [facility] Storm Frisk, MD   7 months ago Essential hypertension   [facility] Storm Frisk, MD   9 months ago Essential hypertension   [facility] Storm Frisk, MD   10 months ago Essential hypertension   [facility] [place], [place], NP      Future Appointments            In 1 month D[place] Charlcie Cradle, MD [facility]   In 2 months Young, Rennis Chris, MD [facility]

## 2020-08-19 ENCOUNTER — Telehealth: Payer: Self-pay

## 2020-08-19 NOTE — Telephone Encounter (Signed)
OXYTROL PA APPROVED UNTIL 08/19/2021

## 2020-08-21 ENCOUNTER — Encounter: Payer: Self-pay | Admitting: Physical Medicine and Rehabilitation

## 2020-08-23 ENCOUNTER — Encounter: Payer: Medicaid Other | Admitting: Physical Therapy

## 2020-08-24 NOTE — Telephone Encounter (Signed)
I'm sure it will be ok to miss CPAP a few days. Just restart when you get back.

## 2020-08-24 NOTE — Telephone Encounter (Signed)
Hello Dr Maple Hudson, please advise on mychart message, thanks!  I need go to funeral probably leave 08/31/2020 or 8/11\2022 be home 8/15-2022 there not space for me to take machine I'm riding with someone please I ask can this be alright to go with out sleeping with the machine because I can't take the person told who is giving me the ride [place] thank you please doctor young.

## 2020-08-25 ENCOUNTER — Ambulatory Visit: Payer: Medicaid Other | Admitting: Physical Medicine and Rehabilitation

## 2020-08-29 ENCOUNTER — Other Ambulatory Visit: Payer: Self-pay | Admitting: Critical Care Medicine

## 2020-08-29 NOTE — Telephone Encounter (Signed)
Requested medication (s) are due for refill today: no  Requested medication (s) are on the active medication list: yes   Last refill:  07/20/2020  Future visit scheduled: yes   Notes to clinic:  Review for refill Script last filled on 07/20/2020    Requested Prescriptions  Pending Prescriptions Disp Refills   gabapentin (NEURONTIN) 300 MG capsule [Pharmacy Med Name: GABAPENTIN 300MG  CAPSULES] 180 capsule     Sig: TAKE 2 CAPSULES(600 MG) BY MOUTH THREE TIMES DAILY      Neurology: Anticonvulsants - gabapentin Passed - 08/29/2020  8:48 AM      Passed - Valid encounter within last 12 months    Recent Outpatient Visits           3 weeks ago Essential hypertension   [facility] 10/29/2020, MD   2 months ago Essential hypertension   [facility] Storm Frisk, MD   8 months ago Essential hypertension   [facility] Storm Frisk, MD   9 months ago Essential hypertension   [facility], MD   10 months ago Essential hypertension   [facility] [place], [place], NP       Future Appointments             In 4 weeks [place], MD [facility]   In 2 months [place], [facility], MD [facility]

## 2020-08-30 ENCOUNTER — Encounter: Payer: Medicaid Other | Admitting: Physical Therapy

## 2020-08-30 ENCOUNTER — Encounter: Payer: Self-pay | Admitting: Physical Therapy

## 2020-08-30 MED ORDER — GABAPENTIN 300 MG PO CAPS: 300.0000 mg | ORAL_CAPSULE | Freq: Three times a day (TID) | ORAL | 3 refills | Status: DC

## 2020-09-06 ENCOUNTER — Ambulatory Visit: Payer: Medicaid Other | Admitting: Physical Therapy

## 2020-09-06 DIAGNOSIS — Z1211 Encounter for screening for malignant neoplasm of colon: Secondary | ICD-10-CM

## 2020-09-07 ENCOUNTER — Other Ambulatory Visit: Payer: Self-pay | Admitting: Internal Medicine

## 2020-09-07 DIAGNOSIS — G4733 Obstructive sleep apnea (adult) (pediatric): Secondary | ICD-10-CM

## 2020-09-07 NOTE — Telephone Encounter (Signed)
Hello Dr. Maple Hudson, please advise on mychart message below, thanks!  Today when I soak the machine parts the part that wholes the water the inside is brown color like it's rustic or something so I rather not use the machine until heat from medical call me 09/06/2020 is when I soak the machine thank you

## 2020-09-07 NOTE — Telephone Encounter (Signed)
Please order to his DME to please service CPAP. He reports water in humidifier is getting discolored like rust.

## 2020-09-08 ENCOUNTER — Encounter: Payer: Medicaid Other | Admitting: Physical Therapy

## 2020-09-13 ENCOUNTER — Other Ambulatory Visit: Payer: Self-pay

## 2020-09-13 ENCOUNTER — Ambulatory Visit: Payer: Medicaid Other | Attending: Family Medicine

## 2020-09-13 DIAGNOSIS — M5442 Lumbago with sciatica, left side: Secondary | ICD-10-CM | POA: Diagnosis not present

## 2020-09-13 DIAGNOSIS — G8929 Other chronic pain: Secondary | ICD-10-CM | POA: Insufficient documentation

## 2020-09-13 DIAGNOSIS — M6281 Muscle weakness (generalized): Secondary | ICD-10-CM | POA: Diagnosis present

## 2020-09-13 NOTE — Therapy (Signed)
[facility] [address] [place], [place], [address] Phone: [phone]   Fax:  [phone]  Physical Therapy Treatment  Patient Details  Name: Jeremiah Hunter MRN: [id number] Date of Birth: 1960-06-24 Referring Provider (PT): Lavada Mesi, MD   Encounter Date: 09/13/2020   PT End of Session - 09/13/20 1123     Visit Number 6    Number of Visits 13    Date for PT Re-Evaluation 10/11/20    Authorization Type [employer]    Authorization Time Period 09/07/20 - 09/27/20; 3 visits approved    Authorization - Visit Number 1    Authorization - Number of Visits 3    PT Start Time 1129    PT Stop Time 1215    PT Time Calculation (min) 46 min    Activity Tolerance Patient tolerated treatment well    Behavior During Therapy [facility] for tasks assessed/performed             Past Medical History:  Diagnosis Date   Acute bilateral low back pain without sciatica 07/30/2019   DDD (degenerative disc disease), lumbar    Enlarged prostate    Hypertension    Severe episode of recurrent major depressive disorder, with psychotic features (HCC) 03/24/2019   Sleep apnea     Past Surgical History:  Procedure Laterality Date   CYSTECTOMY     HERNIA REPAIR      There were no vitals filed for this visit.   Subjective Assessment - 09/13/20 1125     Subjective Pt presents to PT with reports of decreased lower back pain. He states he has been compliant with HEP with no adverse effects. Pt is ready to begin PT at this time.    Currently in Pain? Yes    Pain Score 2     Pain Location Back    Pain Orientation Left;Lower           [facility] Adult PT Treatment/Exercise:   Therapeutic Exercise:  NuStep lvl 5 x 5 min while taking subjective Supine clamshell 2x15 blue tband Supine bridge 2x10 - 3 sec hold Supine march 2x20  SLR 2x10 Physioball rollout 2x10  STS 2x10 - no UE support Modalities: MHP to lumbar paraspinals in supine x 8 min  post session                              PT Short Term Goals - 06/28/20 1134       PT SHORT TERM GOAL #1   Title Pt will be independent with initial HEP    Baseline Reviewed how to use app 06/28/20    Time 3    Period Weeks    Status On-going    Target Date 06/30/20      PT SHORT TERM GOAL #2   Title Pt will be able to decrease 5x STS to <15 sec to demo improved functional LE strength    Baseline 12 sec 06/28/20    Time 3    Period Weeks    Status Achieved    Target Date 06/30/20      PT SHORT TERM GOAL #3   Title Pt will report at least 25% decrease in frequency of his back pain    Baseline Reports no back pain 06/28/20    Time 3    Period Weeks    Status Achieved    Target Date 06/30/20  PT SHORT TERM GOAL #4   Title Pt will have full hip ROM    Time 3    Period Weeks    Status Achieved    Target Date 06/30/20               PT Long Term Goals - 08/16/20 1630       PT LONG TERM GOAL #1   Title Pt will be independent with final HEP    Time 8    Period Weeks    Status On-going    Target Date 10/11/20      PT LONG TERM GOAL #2   Title Pt will demo bilat hip strength to at least 4+/5    Baseline see flowsheet    Time 8    Period Weeks    Status On-going    Target Date 10/11/20      PT LONG TERM GOAL #3   Title Pt will be able to perform 30 minutes of walking a day with no increase in lower back pain    Baseline unable    Time 8    Period Weeks    Status On-going    Target Date 10/11/20      PT LONG TERM GOAL #4   Title Pt will self report LBP no greater than 3/10 at worst in order to improve comfort and function    Baseline 10/10 at worst current    Time 8    Period Weeks    Status On-going    Target Date 10/11/20      PT LONG TERM GOAL #5   Title Pt will decrease 5xSTS time to no greater than 15 seconds in order to improve balance and functional mobility    Baseline 30 seconds on 08/16/20    Time 8    Period Weeks     Status New    Target Date 10/11/20                   Plan - 09/13/20 1143     Clinical Impression Statement Pt was once again able to complete prescribed exercises with improved tolerance and functional ability. Today's session again focused on increasing core and proximal hip strength, with pt showing improvement in status since re-evaluation. Pt continues to benefit from skilled PT services and will continue to be seen and progressed as tolerated.    PT Treatment/Interventions ADLs/Self Care Home Management;Electrical Stimulation;Cryotherapy;Iontophoresis 4mg /ml Dexamethasone;Moist Heat;Ultrasound;Gait training;Stair training;Functional mobility training;Therapeutic activities;Therapeutic exercise;Balance training;Neuromuscular re-education;Patient/family education;Manual techniques;Passive range of motion;Dry needling;Taping;Vasopneumatic Device;Joint Manipulations;Spinal Manipulations    PT Next Visit Plan Assess response to HEP; progress exercises as tolerated    PT Home Exercise Plan Access Code [phone]             Patient will benefit from skilled therapeutic intervention in order to improve the following deficits and impairments:  Abnormal gait, Decreased mobility, Difficulty walking, Decreased range of motion, Improper body mechanics, Decreased strength, Pain, Postural dysfunction  Visit Diagnosis: Chronic midline low back pain with left-sided sciatica  Muscle weakness (generalized)     Problem List Patient Active Problem List   Diagnosis Date Noted   DDD (degenerative disc disease), lumbar    Prolapsed internal hemorrhoids, grade 3 09/25/2019   Meralgia paresthetica of left side 09/11/2019   Bipolar affective disorder, currently depressed, moderate (HCC) 09/11/2019   External hemorrhoids with complication 07/30/2019   Overactive bladder 06/11/2019   OSA (obstructive sleep apnea) 03/24/2019   Essential hypertension 03/24/2019  Anxiety and depression  03/24/2019   Seizure disorder (HCC) 03/24/2019    Eloy End, PT, DPT 09/13/20 12:17 PM  [facility] [address] [place], [place], [address] Phone: [phone]   Fax:  [phone]  Name: Jeremiah Hunter MRN: [id number] Date of Birth: 1960/11/12

## 2020-09-14 ENCOUNTER — Telehealth: Payer: Self-pay | Admitting: Internal Medicine

## 2020-09-14 NOTE — Telephone Encounter (Signed)
Called and spoke with Patient.  Patient stated his cpap machine is currently rusting and he has not used it.  Patient stated he spoke with someone from [employer] yesterday and was told to soak his cpap in vinegar.  Patient stated he has soaked his cpap twice in vinegar and still has rust color. Called [employer] and was told they had spoke with Patient and advised him to clean his cpap.  Advised [employer] Patient stated he had done 2 vinegar soaks and still having rust issues.   Judeth Cornfield stated Patient needed to call [employer], and Judeth Cornfield was going to [place] call Patient to go over cpap issues, and problem solve issue. ATC Patient.  Left message (DPR) for Patient to call [employer] for [place] to discuss cpap problems.

## 2020-09-15 ENCOUNTER — Encounter: Payer: Self-pay | Admitting: Podiatry

## 2020-09-20 ENCOUNTER — Ambulatory Visit: Payer: Medicaid Other

## 2020-09-21 ENCOUNTER — Ambulatory Visit: Payer: Medicaid Other | Admitting: Critical Care Medicine

## 2020-09-23 ENCOUNTER — Other Ambulatory Visit: Payer: Self-pay | Admitting: Critical Care Medicine
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
## 2020-09-27 ENCOUNTER — Encounter: Payer: Self-pay | Admitting: Critical Care Medicine

## 2020-09-27 ENCOUNTER — Ambulatory Visit: Payer: Medicaid Other | Attending: Critical Care Medicine

## 2020-09-27 ENCOUNTER — Ambulatory Visit: Payer: Medicaid Other | Attending: Critical Care Medicine | Admitting: Critical Care Medicine

## 2020-09-27 ENCOUNTER — Other Ambulatory Visit: Payer: Self-pay

## 2020-09-27 VITALS — BP 109/74 | HR 90 | Ht 70.0 in | Wt 205.6 lb

## 2020-09-27 DIAGNOSIS — I10 Essential (primary) hypertension: Secondary | ICD-10-CM | POA: Diagnosis not present

## 2020-09-27 DIAGNOSIS — M5442 Lumbago with sciatica, left side: Secondary | ICD-10-CM | POA: Insufficient documentation

## 2020-09-27 DIAGNOSIS — F319 Bipolar disorder, unspecified: Secondary | ICD-10-CM | POA: Insufficient documentation

## 2020-09-27 DIAGNOSIS — R2689 Other abnormalities of gait and mobility: Secondary | ICD-10-CM | POA: Diagnosis present

## 2020-09-27 DIAGNOSIS — F4310 Post-traumatic stress disorder, unspecified: Secondary | ICD-10-CM | POA: Insufficient documentation

## 2020-09-27 DIAGNOSIS — G40909 Epilepsy, unspecified, not intractable, without status epilepticus: Secondary | ICD-10-CM

## 2020-09-27 DIAGNOSIS — G4733 Obstructive sleep apnea (adult) (pediatric): Secondary | ICD-10-CM

## 2020-09-27 DIAGNOSIS — Z79899 Other long term (current) drug therapy: Secondary | ICD-10-CM | POA: Diagnosis not present

## 2020-09-27 DIAGNOSIS — M5136 Other intervertebral disc degeneration, lumbar region: Secondary | ICD-10-CM

## 2020-09-27 DIAGNOSIS — Z23 Encounter for immunization: Secondary | ICD-10-CM

## 2020-09-27 DIAGNOSIS — K644 Residual hemorrhoidal skin tags: Secondary | ICD-10-CM | POA: Diagnosis not present

## 2020-09-27 DIAGNOSIS — G8929 Other chronic pain: Secondary | ICD-10-CM | POA: Insufficient documentation

## 2020-09-27 DIAGNOSIS — K642 Third degree hemorrhoids: Secondary | ICD-10-CM

## 2020-09-27 DIAGNOSIS — M51369 Other intervertebral disc degeneration, lumbar region without mention of lumbar back pain or lower extremity pain: Secondary | ICD-10-CM

## 2020-09-27 DIAGNOSIS — N3281 Overactive bladder: Secondary | ICD-10-CM | POA: Diagnosis not present

## 2020-09-27 DIAGNOSIS — F3132 Bipolar disorder, current episode depressed, moderate: Secondary | ICD-10-CM

## 2020-09-27 DIAGNOSIS — M6281 Muscle weakness (generalized): Secondary | ICD-10-CM | POA: Insufficient documentation

## 2020-09-27 MED ORDER — OXYTROL 3.9 MG/24HR TD PTTW: MEDICATED_PATCH | TRANSDERMAL | 2 refills | Status: DC

## 2020-09-27 MED ORDER — BACLOFEN 10 MG PO TABS: ORAL_TABLET | ORAL | 6 refills | Status: DC

## 2020-09-27 MED ORDER — HYDROCHLOROTHIAZIDE 25 MG PO TABS: 25.0000 mg | ORAL_TABLET | Freq: Every day | ORAL | 3 refills | Status: DC

## 2020-09-27 MED ORDER — LIDOCAINE-PRILOCAINE 2.5-2.5 % EX CREA: 1.0000 "application " | TOPICAL_CREAM | CUTANEOUS | 5 refills | Status: DC | PRN

## 2020-09-27 NOTE — Patient Instructions (Signed)
Pneumococcal vaccine and flu vaccine was given  Gastroenterology will call you for the colonoscopy to be scheduled  Referral to an anorectal surgeon was made for your hemorrhoids to have them evaluated  No change in medications refill sent to your pharmacy  Continue to follow through with your physical therapy appointments we will see how your back does and based upon the results of that you may need to go back to see the orthopedic doctor  See attachment on how you can help your constipation problem which may be making your hemorrhoids worse  Your weight and blood pressure were wonderful at this visit  Return to see Dr. Delford Field 76-month

## 2020-09-27 NOTE — Assessment & Plan Note (Signed)
Significant prolapsed internal hemorrhoids and external hemorrhoids will refer to anorectal surgeon

## 2020-09-27 NOTE — Assessment & Plan Note (Signed)
Continue CPAP therapy note weight is down to 205 pounds

## 2020-09-27 NOTE — Assessment & Plan Note (Signed)
Continue depends and Oxytrol

## 2020-09-27 NOTE — Therapy (Signed)
[place], [place], [address] Phone: [phone]   Fax:  [phone]  Physical Therapy Treatment  Patient Details  Name: Jeremiah Hunter MRN: [id number] Date of Birth: Aug 02, 1960 Referring Provider (PT): Eunice Blase, MD   Encounter Date: 09/27/2020   PT End of Session - 09/27/20 1458     Visit Number 7    Number of Visits 13    Date for PT Re-Evaluation 10/11/20    Authorization Type MCD [place] Access    Authorization Time Period 09/07/20 - 09/27/20; 3 visits approved    Authorization - Visit Number 2    Authorization - Number of Visits 3    PT Start Time 1500   arrived late   PT Stop Time 1531    PT Time Calculation (min) 31 min    Activity Tolerance Patient tolerated treatment well    Behavior During Therapy [facility] for tasks assessed/performed             Past Medical History:  Diagnosis Date   Acute bilateral low back pain without sciatica 07/30/2019   DDD (degenerative disc disease), lumbar    Enlarged prostate    Hypertension    Severe episode of recurrent major depressive disorder, with psychotic features ([place]) 03/24/2019   Sleep apnea     Past Surgical History:  Procedure Laterality Date   CYSTECTOMY     HERNIA REPAIR      There were no vitals filed for this visit.   Subjective Assessment - 09/27/20 1500     Subjective Pt presents to PT with reports of continued lower back pain. Pt has been compliant with HEP with no adverse effect. Pt is ready to begin PT treatment at this time.    Currently in Pain? Yes    Pain Score 3     Pain Location Back    Pain Orientation Lower              [facility] Adult PT Treatment/Exercise:   Therapeutic Exercise:  NuStep lvl 5 x 5 min while taking subjective Supine clamshell 2x15 blue tband Supine bridge w/ ball squeeze 2x10 - 3 sec hold Supine march 2x20 blue tband SLR 2x10 Physioball rollout 2x10  STS 2x10 - no UE support Leg press 2x10  55lbs Modalities: MHP to lumbar paraspinals in supine x 9 min post session     [facility] PT Assessment - 09/27/20 0001       Strength   Right Hip Flexion 4+/5    Right Hip ABduction 4+/5    Right Hip ADduction 4+/5    Left Hip Flexion 4+/5    Left Hip ABduction 4+/5    Left Hip ADduction 4+/5      Transfers   Five time sit to stand comments  18 seconds - no UE support                                     PT Short Term Goals - 09/27/20 1508       PT SHORT TERM GOAL #1   Title Pt will be independent with initial HEP    Baseline Reviewed how to use app 06/28/20    Time 3    Period Weeks    Status Achieved    Target Date 06/30/20      PT SHORT TERM GOAL #2   Title Pt will be able to decrease 5x  STS to <15 sec to demo improved functional LE strength    Baseline 12 sec 06/28/20    Time 3    Period Weeks    Status Achieved    Target Date 06/30/20      PT SHORT TERM GOAL #3   Title Pt will report at least 25% decrease in frequency of his back pain    Baseline Reports no back pain 06/28/20    Time 3    Period Weeks    Status Achieved    Target Date 06/30/20      PT SHORT TERM GOAL #4   Title Pt will have full hip ROM    Time 3    Period Weeks    Status Achieved    Target Date 06/30/20               PT Long Term Goals - 09/27/20 1504       PT LONG TERM GOAL #1   Title Pt will be independent with final HEP    Time 8    Period Weeks    Status On-going    Target Date 10/11/20      PT LONG TERM GOAL #2   Title Pt will demo bilat hip strength to at least 4+/5    Baseline see flowsheet    Time 8    Period Weeks    Status Achieved    Target Date 10/11/20      PT LONG TERM GOAL #3   Title Pt will be able to perform 30 minutes of walking a day with no increase in lower back pain    Baseline unable    Time 8    Period Weeks    Status On-going    Target Date 10/11/20      PT LONG TERM GOAL #4   Title Pt will self report LBP no  greater than 3/10 at worst in order to improve comfort and function    Baseline 10/10 at worst current    Time 8    Period Weeks    Status Partially Met    Target Date 10/11/20      PT LONG TERM GOAL #5   Title Pt will decrease 5xSTS time to no greater than 15 seconds in order to improve balance and functional mobility    Baseline 30 seconds on 08/16/20; 18 seconds on 09/27/20    Time 8    Period Weeks    Status Partially Met    Target Date 10/11/20                   Plan - 09/27/20 1511     Clinical Impression Statement Pt was once again able to complete prescribed exercises and reported no change in baseline pain today. Today's session we again focused on increasing core and proximal hip strength in order to decrease lower back pain and improve functional mobility. Pt has made progress since restarting PT a few weeks ago, noting decrease in lower back pain today to 3/10 while also improving 5xSTS time from 30 Seconds at Re-Eval to 18 seconds today, showing improving strength and functional mobility. He continues to benefit from skilled PT and will continue to be seen per requested POC as prescribed.    Personal Factors and Comorbidities Age;Time since onset of injury/illness/exacerbation;Comorbidity 1;Comorbidity 2;Comorbidity 3+    Comorbidities DDD, enlarged prostate, HTN, depression, sleep apnea    Examination-Activity Limitations Bed Mobility;Locomotion Level;Stand;Sit    Examination-Participation Restrictions [employer]  Activity;Other;Shop    PT Frequency 1x / week    PT Duration 3 weeks    PT Treatment/Interventions ADLs/Self Care Home Management;Electrical Stimulation;Cryotherapy;Iontophoresis 82m/ml Dexamethasone;Moist Heat;Ultrasound;Gait training;Stair training;Functional mobility training;Therapeutic activities;Therapeutic exercise;Balance training;Neuromuscular re-education;Patient/family education;Manual techniques;Passive range of motion;Dry  needling;Taping;Vasopneumatic Device;Joint Manipulations;Spinal Manipulations    PT Next Visit Plan Assess response to HEP; progress exercises as tolerated    PT Home Exercise Plan Access Code [email or web address]   Consulted and Agree with Plan of Care Patient             Patient will benefit from skilled therapeutic intervention in order to improve the following deficits and impairments:  Abnormal gait, Decreased mobility, Difficulty walking, Decreased range of motion, Improper body mechanics, Decreased strength, Pain, Postural dysfunction  Visit Diagnosis: Chronic midline low back pain with left-sided sciatica  Muscle weakness (generalized)     Problem List Patient Active Problem List   Diagnosis Date Noted   DDD (degenerative disc disease), lumbar    Prolapsed internal hemorrhoids, grade 3 09/25/2019   Meralgia paresthetica of left side 09/11/2019   Bipolar affective disorder, currently depressed, moderate ([place] 09/11/2019   External hemorrhoids with complication 017/61/6073  Overactive bladder 06/11/2019   OSA (obstructive sleep apnea) 03/24/2019   Essential hypertension 03/24/2019   Anxiety and depression 03/24/2019   Seizure disorder ([place] 03/24/2019    DWard Chatters PT, DPT 09/27/20 3:44 PM  [facility] [address]Phone: [phone]  Fax:  [phone] Name: HIvie MaeseMRN: [id number] of Birth: 408/31/1962

## 2020-09-27 NOTE — Assessment & Plan Note (Signed)
Blood pressure well controlled no change in medications refills given 

## 2020-09-27 NOTE — Assessment & Plan Note (Signed)
No change in medications as per psychiatry

## 2020-09-27 NOTE — Assessment & Plan Note (Signed)
Continue Neurontin and baclofen along with physical therapy

## 2020-09-27 NOTE — Assessment & Plan Note (Signed)
Continue Depakote. 

## 2020-09-29 ENCOUNTER — Telehealth: Payer: Self-pay | Admitting: *Deleted

## 2020-09-29 ENCOUNTER — Telehealth: Payer: Self-pay | Admitting: Family Medicine

## 2020-09-29 NOTE — Telephone Encounter (Signed)
Can you follow up on this? We can provide office notes

## 2020-09-29 NOTE — Telephone Encounter (Signed)
Pt is asking if the last diagnosis given to him can be faxed to him in care of Ms Abby @ [phone]

## 2020-09-29 NOTE — Telephone Encounter (Signed)
Done

## 2020-10-04 ENCOUNTER — Ambulatory Visit: Payer: Medicaid Other

## 2020-10-04 ENCOUNTER — Other Ambulatory Visit: Payer: Self-pay

## 2020-10-04 DIAGNOSIS — M6281 Muscle weakness (generalized): Secondary | ICD-10-CM

## 2020-10-04 DIAGNOSIS — G8929 Other chronic pain: Secondary | ICD-10-CM

## 2020-10-04 NOTE — Therapy (Signed)
[place], [place], [address] Phone: [phone]   Fax:  [phone]  Physical Therapy Treatment  Patient Details  Name: Jeremiah Hunter MRN: [id number] Date of Birth: May 09, 1960 Referring Provider (PT): Eunice Blase, MD   Encounter Date: 10/04/2020   PT End of Session - 10/04/20 1142     Visit Number 7   no charge visit encounter   Number of Visits 13    Date for PT Re-Evaluation 10/11/20    Authorization Type MCD [place] Access    Authorization Time Period 10/04/2020 - 10/24/2020    Authorization - Visit Number --   no charge visit; 3 have been authorized   Authorization - Number of Visits 3    PT Start Time 1143    PT Stop Time 1146    PT Time Calculation (min) 3 min    Activity Tolerance Patient tolerated treatment well    Behavior During Therapy [facility] for tasks assessed/performed             Past Medical History:  Diagnosis Date   Acute bilateral low back pain without sciatica 07/30/2019   DDD (degenerative disc disease), lumbar    Enlarged prostate    Hypertension    Severe episode of recurrent major depressive disorder, with psychotic features ([place]) 03/24/2019   Sleep apnea     Past Surgical History:  Procedure Laterality Date   CYSTECTOMY     HERNIA REPAIR      There were no vitals filed for this visit.   Subjective Assessment - 10/04/20 1143     Subjective See assessment    Currently in Pain? Other (Comment)   see assessment                                         PT Short Term Goals - 09/27/20 1508       PT SHORT TERM GOAL #1   Title Pt will be independent with initial HEP    Baseline Reviewed how to use app 06/28/20    Time 3    Period Weeks    Status Achieved    Target Date 06/30/20      PT SHORT TERM GOAL #2   Title Pt will be able to decrease 5x STS to <15 sec to demo improved functional LE strength    Baseline 12 sec 06/28/20    Time 3     Period Weeks    Status Achieved    Target Date 06/30/20      PT SHORT TERM GOAL #3   Title Pt will report at least 25% decrease in frequency of his back pain    Baseline Reports no back pain 06/28/20    Time 3    Period Weeks    Status Achieved    Target Date 06/30/20      PT SHORT TERM GOAL #4   Title Pt will have full hip ROM    Time 3    Period Weeks    Status Achieved    Target Date 06/30/20               PT Long Term Goals - 09/27/20 1504       PT LONG TERM GOAL #1   Title Pt will be independent with final HEP    Time 8    Period Weeks    Status  On-going    Target Date 10/11/20      PT LONG TERM GOAL #2   Title Pt will demo bilat hip strength to at least 4+/5    Baseline see flowsheet    Time 8    Period Weeks    Status Achieved    Target Date 10/11/20      PT LONG TERM GOAL #3   Title Pt will be able to perform 30 minutes of walking a day with no increase in lower back pain    Baseline unable    Time 8    Period Weeks    Status On-going    Target Date 10/11/20      PT LONG TERM GOAL #4   Title Pt will self report LBP no greater than 3/10 at worst in order to improve comfort and function    Baseline 10/10 at worst current    Time 8    Period Weeks    Status Partially Met    Target Date 10/11/20      PT LONG TERM GOAL #5   Title Pt will decrease 5xSTS time to no greater than 15 seconds in order to improve balance and functional mobility    Baseline 30 seconds on 08/16/20; 18 seconds on 09/27/20    Time 8    Period Weeks    Status Partially Met    Target Date 10/11/20                   Plan - 10/04/20 1153     Clinical Impression Statement Patient arrived and stated that he really did not feel well and did not want to perform PT treatment today. He states that he is feeling very depressed and as a lot of things going on that make him not feel up for treatment. PT informed patient that should he not wish to perform treatment he would  not have to, and if he felt he would like to be dishcharged at this time all he would need to do is let us know. He stated that as of now he would like to keep his last scheduled visit, therefore no current change in plan needed. No charge visit to preserve medicaid visit limit.    PT Treatment/Interventions ADLs/Self Care Home Management;Electrical Stimulation;Cryotherapy;Iontophoresis 19m/ml Dexamethasone;Moist Heat;Ultrasound;Gait training;Stair training;Functional mobility training;Therapeutic activities;Therapeutic exercise;Balance training;Neuromuscular re-education;Patient/family education;Manual techniques;Passive range of motion;Dry needling;Taping;Vasopneumatic Device;Joint Manipulations;Spinal Manipulations    PT Home Exercise Plan Access Code [id number]   Consulted and Agree with Plan of Care Patient             Patient will benefit from skilled therapeutic intervention in order to improve the following deficits and impairments:  Abnormal gait, Decreased mobility, Difficulty walking, Decreased range of motion, Improper body mechanics, Decreased strength, Pain, Postural dysfunction  Visit Diagnosis: Chronic midline low back pain with left-sided sciatica  Muscle weakness (generalized)     Problem List Patient Active Problem List   Diagnosis Date Noted   DDD (degenerative disc disease), lumbar    Prolapsed internal hemorrhoids, grade 3 09/25/2019   Meralgia paresthetica of left side 09/11/2019   Bipolar affective disorder, currently depressed, moderate ([place] 09/11/2019   External hemorrhoids with complication 029/47/6546  Overactive bladder 06/11/2019   OSA (obstructive sleep apnea) 03/24/2019   Essential hypertension 03/24/2019   Anxiety and depression 03/24/2019   Seizure disorder ([place] 03/24/2019    DWard Chatters PT 10/04/2020, 11:56 AM  [facility]  [place], [place], [address] Phone: [phone]    Fax:  [phone]  Name: Syair Fricker MRN: [id number] Date of Birth: [birth year]

## 2020-10-06 ENCOUNTER — Other Ambulatory Visit: Payer: Self-pay

## 2020-10-06 ENCOUNTER — Emergency Department (HOSPITAL_COMMUNITY)
Admission: EM | Admit: 2020-10-06 | Discharge: 2020-10-06 | Disposition: A | Discharge status: Home / Self Care | Payer: Medicaid Other | Attending: Physician Assistant | Admitting: Physician Assistant

## 2020-10-06 ENCOUNTER — Encounter (HOSPITAL_COMMUNITY): Payer: Self-pay | Admitting: Emergency Medicine

## 2020-10-06 DIAGNOSIS — I10 Essential (primary) hypertension: Secondary | ICD-10-CM | POA: Insufficient documentation

## 2020-10-06 DIAGNOSIS — R21 Rash and other nonspecific skin eruption: Secondary | ICD-10-CM | POA: Diagnosis present

## 2020-10-06 DIAGNOSIS — Z79899 Other long term (current) drug therapy: Secondary | ICD-10-CM | POA: Insufficient documentation

## 2020-10-06 MED ORDER — HYDROCORTISONE 2.5 % EX LOTN: TOPICAL_LOTION | Freq: Two times a day (BID) | CUTANEOUS | 0 refills | Status: DC

## 2020-10-06 NOTE — ED Notes (Signed)
RN reviewed discharge instructions w/ pt. Follow up and prescriptions reviewed, pt had no further questions °

## 2020-10-06 NOTE — ED Triage Notes (Signed)
Pt here for rash to upper L back and L shoulder. Pt first noticed it yesterday, said it got worse today after showering. Reports itching, denies pain. Pt has raised red hives on back of L shoulder.

## 2020-10-06 NOTE — Discharge Instructions (Signed)
Use the cream as directed   Please follow up with your primary care provider within 5-7 days for re-evaluation of your symptoms. If you do not have a primary care provider, information for a healthcare clinic has been provided for you to make arrangements for follow up care. Please return to the emergency department for any new or worsening symptoms.

## 2020-10-06 NOTE — ED Provider Notes (Signed)
[facility] EMERGENCY DEPARTMENT Provider Note   CSN: [id number] Arrival date & time: 10/06/20  1838     History Chief Complaint  Patient presents with   Rash    Jeremiah Hunter is a 60 y.o. male.  HPI   60 y/o male with a  h/o ddd, enlarged prostate, htn, depression, sleep apnea, who presents to the ED today for eval of rash. Reports red, itchy rash noted just to the left shoulder that started yesterday. Denies any new exposures to medications, soaps, detergents, foods or other environmental changes. Denies facial swelling or difficulty breathing.   Past Medical History:  Diagnosis Date   Acute bilateral low back pain without sciatica 07/30/2019   DDD (degenerative disc disease), lumbar    Enlarged prostate    Hypertension    Severe episode of recurrent major depressive disorder, with psychotic features ([facility]) 03/24/2019   Sleep apnea     Patient Active Problem List   Diagnosis Date Noted   DDD (degenerative disc disease), lumbar    Prolapsed internal hemorrhoids, grade 3 09/25/2019   Meralgia paresthetica of left side 09/11/2019   Bipolar affective disorder, currently depressed, moderate (HCC) 09/11/2019   External hemorrhoids with complication 07/30/2019   Overactive bladder 06/11/2019   OSA (obstructive sleep apnea) 03/24/2019   Essential hypertension 03/24/2019   Anxiety and depression 03/24/2019   Seizure disorder (HCC) 03/24/2019    Past Surgical History:  Procedure Laterality Date   CYSTECTOMY     HERNIA REPAIR         Family History  Family history unknown: Yes    Social History   Tobacco Use   Smoking status: Never   Smokeless tobacco: Never  Vaping Use   Vaping Use: Never used  Substance Use Topics   Alcohol use: Never   Drug use: Never    Home Medications Prior to Admission medications   Medication Sig Start Date End Date Taking? Authorizing Provider  hydrocortisone 2.5 % lotion Apply topically 2 (two) times daily. 10/06/20   Yes Callan Norden S, PA-C  B Complex-C-E-Zn (EQL STRESS B-COMPLEX C/ZINC) TABS One a day po. With food. 09/11/19   Dohmeier, Porfirio Mylar, MD  baclofen (LIORESAL) 10 MG tablet TAKE 1/2 TO 1 TABLET(5 TO 10 MG) BY MOUTH THREE TIMES DAILY AS NEEDED FOR MUSCLE SPASMS 09/27/20   Storm Frisk, MD  cholecalciferol (VITAMIN D3) 25 MCG (1000 UNIT) tablet Take 2,000 Units by mouth daily.    [provider]  diclofenac Sodium (VOLTAREN) 1 % GEL APPLY 2 GRAMS TOPICALLY TO THE AFFECTED AREA FOUR TIMES DAILY 09/23/20   Storm Frisk, MD  divalproex (DEPAKOTE ER) 500 MG 24 hr tablet Take 1 tablet (500 mg total) by mouth in the morning and at bedtime. 06/23/20   Storm Frisk, MD  FLUoxetine (PROZAC) 20 MG capsule Take 1 capsule (20 mg total) by mouth daily. 06/23/20   Storm Frisk, MD  gabapentin (NEURONTIN) 300 MG capsule TAKE 2 CAPSULES(600 MG) BY MOUTH THREE TIMES DAILY 08/30/20   Storm Frisk, MD  hydrochlorothiazide (HYDRODIURIL) 25 MG tablet Take 1 tablet (25 mg total) by mouth daily. 09/27/20   Storm Frisk, MD  lidocaine-prilocaine (EMLA) cream Apply 1 application topically as needed. 09/27/20   Storm Frisk, MD  loratadine (CLARITIN) 10 MG tablet Take 1 tablet (10 mg total) by mouth daily. 06/23/20   Storm Frisk, MD  Omega-3 Fatty Acids (FISH OIL) 1000 MG CAPS Take by mouth.  [provider]  OXYTROL 3.9 MG/24HR APPLY 1 PATCH TOPICALLY TO THE SKIN 2 TIMES A WEEK 09/27/20   Storm Frisk, MD  QUEtiapine (SEROQUEL) 100 MG tablet Take 1 tablet (100 mg total) by mouth at bedtime. 06/23/20   Storm Frisk, MD    Allergies    Losartan  Review of Systems   Review of Systems  Constitutional:  Negative for fever.  HENT:  Negative for facial swelling and trouble swallowing.   Respiratory:  Negative for shortness of breath.   Skin:  Positive for rash.   Physical Exam Updated Vital Signs BP (!) 126/96 (BP Location: Right Arm)   Pulse 85   Temp 98.1 F (36.7 C)  (Oral)   Resp 18   Ht 5\' 10"  (1.778 m)   Wt 93 kg   SpO2 97%   BMI 29.42 kg/m   Physical Exam Constitutional:      General: He is not in acute distress.    Appearance: He is well-developed.  Eyes:     Conjunctiva/sclera: Conjunctivae normal.  Cardiovascular:     Rate and Rhythm: Normal rate and regular rhythm.  Pulmonary:     Effort: Pulmonary effort is normal.     Breath sounds: Normal breath sounds.  Skin:    General: Skin is warm and dry.     Comments: Urticarial rash noted to the left shoulder only  Neurological:     Mental Status: He is alert and oriented to person, place, and time.      ED Results / Procedures / Treatments   Labs (all labs ordered are listed, but only abnormal results are displayed) Labs Reviewed - No data to display  EKG None  Radiology No results found.  Procedures Procedures   Medications Ordered in ED Medications - No data to display  ED Course  I have reviewed the triage vital signs and the nursing notes.  Pertinent labs & imaging results that were available during my care of the patient were reviewed by me and considered in my medical decision making (see chart for details).    MDM Rules/Calculators/A&P                          Rash consistent with allergic rxn. Patient denies any difficulty breathing or swallowing.  Pt has a patent airway without stridor and is handling secretions without difficulty; no angioedema. No blisters, no pustules, no warmth, no draining sinus tracts, no superficial abscesses, no bullous impetigo, no vesicles, no desquamation, no target lesions with dusky purpura or a central bulla. Not tender to touch. No concern for superimposed infection. No concern for SJS, TEN, TSS, tick borne illness, syphilis or other life-threatening condition. Will discharge home with topical steroids given minimal sxs and mild presentation. Pcp f/u recommended  Final Clinical Impression(s) / ED Diagnoses Final diagnoses:  Rash  and nonspecific skin eruption    Rx / DC Orders ED Discharge Orders          Ordered    hydrocortisone 2.5 % lotion  2 times daily        10/06/20 1853             10/08/20, PA-C 10/06/20 1904    10/08/20, MD 10/08/20 1150

## 2020-10-10 NOTE — Telephone Encounter (Signed)
error 

## 2020-10-11 ENCOUNTER — Ambulatory Visit: Payer: Medicaid Other

## 2020-10-11 ENCOUNTER — Other Ambulatory Visit: Payer: Self-pay

## 2020-10-11 DIAGNOSIS — G8929 Other chronic pain: Secondary | ICD-10-CM

## 2020-10-11 DIAGNOSIS — M6281 Muscle weakness (generalized): Secondary | ICD-10-CM

## 2020-10-11 DIAGNOSIS — M5442 Lumbago with sciatica, left side: Secondary | ICD-10-CM | POA: Diagnosis not present

## 2020-10-11 DIAGNOSIS — R2689 Other abnormalities of gait and mobility: Secondary | ICD-10-CM

## 2020-10-11 NOTE — Therapy (Signed)
[facility] [place], [place], [address] Phone: [phone]   Fax:  [phone]  Physical Therapy Treatment/Discharge  Patient Details  Name: Jeremiah Hunter MRN: [id number] Date of Birth: Apr 26, 1960 Referring Provider (PT): Eunice Blase, MD   Encounter Date: 10/11/2020   PT End of Session - 10/11/20 1224     Visit Number 8    Number of Visits 13    Date for PT Re-Evaluation 10/11/20    Authorization Type [facility] [place] Access    Authorization Time Period 10/04/2020 - 10/24/2020    Authorization - Visit Number --   no charge visit; 3 have been authorized   Authorization - Number of Visits 3    PT Start Time 1225   arrived late   PT Stop Time 1250    PT Time Calculation (min) 25 min    Activity Tolerance Patient tolerated treatment well    Behavior During Therapy [facility] for tasks assessed/performed             Past Medical History:  Diagnosis Date   Acute bilateral low back pain without sciatica 07/30/2019   DDD (degenerative disc disease), lumbar    Enlarged prostate    Hypertension    Severe episode of recurrent major depressive disorder, with psychotic features ([place]) 03/24/2019   Sleep apnea     Past Surgical History:  Procedure Laterality Date   CYSTECTOMY     HERNIA REPAIR      There were no vitals filed for this visit.   Subjective Assessment - 10/11/20 1224     Subjective Pt presents to PT with reports of increased back pain at present. He notes that he has not been feeling too good lately, saying he has been feeling more down. He also explains that he has some N/T in L thigh now. Pt is ready to begin PT at this time.    Currently in Pain? Yes    Pain Score 10-Worst pain ever    Pain Location Back    Pain Orientation Lower           [facility] Adult PT Treatment/Exercise:   Therapeutic Exercise:  Supine clamshell x 15 blue tband Supine bridge x1 0 - 3 sec hold Supine ball squeeze x 10 - 5 sec  hold Seated fwd flex x 10  Seated hamstring stretch x 30 sec ea LTR x 10     [facility] PT Assessment - 10/11/20 0001       Transfers   Five time sit to stand comments  18 seconds - no UE support                                    PT Education - 10/11/20 1251     Education Details HEP and discharge planning    Person(s) Educated Patient    Methods Explanation;Demonstration;Handout    Comprehension Verbalized understanding;Returned demonstration              PT Short Term Goals - 09/27/20 1508       PT SHORT TERM GOAL #1   Title Pt will be independent with initial HEP    Baseline Reviewed how to use app 06/28/20    Time 3    Period Weeks    Status Achieved    Target Date 06/30/20      PT SHORT TERM GOAL #2   Title Pt will be able  to decrease 5x STS to <15 sec to demo improved functional LE strength    Baseline 12 sec 06/28/20    Time 3    Period Weeks    Status Achieved    Target Date 06/30/20      PT SHORT TERM GOAL #3   Title Pt will report at least 25% decrease in frequency of his back pain    Baseline Reports no back pain 06/28/20    Time 3    Period Weeks    Status Achieved    Target Date 06/30/20      PT SHORT TERM GOAL #4   Title Pt will have full hip ROM    Time 3    Period Weeks    Status Achieved    Target Date 06/30/20               PT Long Term Goals - 10/11/20 1230       PT LONG TERM GOAL #1   Title Pt will be independent with final HEP    Time 8    Period Weeks    Status Achieved      PT LONG TERM GOAL #2   Title Pt will demo bilat hip strength to at least 4+/5    Baseline see flowsheet    Time 8    Period Weeks    Status Achieved      PT LONG TERM GOAL #3   Title Pt will be able to perform 30 minutes of walking a day with no increase in lower back pain    Baseline unable; update on 9/20 - "10 minutes at most"    Time 8    Period Weeks    Status Not Met      PT LONG TERM GOAL #4   Title Pt will  self report LBP no greater than 3/10 at worst in order to improve comfort and function    Baseline 10/10 at worst current - continued 10/10 pain today on 9/20    Time 8    Period Weeks    Status Not Met      PT LONG TERM GOAL #5   Title Pt will decrease 5xSTS time to no greater than 15 seconds in order to improve balance and functional mobility    Baseline 30 seconds on 08/16/20; 18 seconds on 09/27/20; 18 seconds on 10/11/20    Time 8    Period Weeks    Status Partially Met                   Plan - 10/11/20 1252     Clinical Impression Statement Pt was able to complete prescribed exercises and demonstrated knowledge of HEP with no adverse effect. Over the course of PT, pt has not had much therapeutic benefit or change in status. Continues to have severe LBP and decreased functional mobility. He did improve his 5xSTS compared to first testing, but seems to have plateau'd in terms of continued improvement in therapy. Pt is being discharged at this time and is in agreement with plan. He should continue to improve slightly with HEP compliance and will soon call referring MD to set up f/u visit.    PT Treatment/Interventions ADLs/Self Care Home Management;Electrical Stimulation;Cryotherapy;Iontophoresis 17m/ml Dexamethasone;Moist Heat;Ultrasound;Gait training;Stair training;Functional mobility training;Therapeutic activities;Therapeutic exercise;Balance training;Neuromuscular re-education;Patient/family education;Manual techniques;Passive range of motion;Dry needling;Taping;Vasopneumatic Device;Joint Manipulations;Spinal Manipulations    PT Home Exercise Plan Access Code [email or web address]   Consulted and Agree with Plan of Care  Patient             Patient will benefit from skilled therapeutic intervention in order to improve the following deficits and impairments:  Abnormal gait, Decreased mobility, Difficulty walking, Decreased range of motion, Improper body mechanics, Decreased strength, Pain,  Postural dysfunction  Visit Diagnosis: Chronic midline low back pain with left-sided sciatica  Muscle weakness (generalized)  Other abnormalities of gait and mobility     Problem List Patient Active Problem List   Diagnosis Date Noted   DDD (degenerative disc disease), lumbar    Prolapsed internal hemorrhoids, grade 3 09/25/2019   Meralgia paresthetica of left side 09/11/2019   Bipolar affective disorder, currently depressed, moderate ([place]) 09/11/2019   External hemorrhoids with complication 98/33/8250   Overactive bladder 06/11/2019   OSA (obstructive sleep apnea) 03/24/2019   Essential hypertension 03/24/2019   Anxiety and depression 03/24/2019   Seizure disorder ([place]) 03/24/2019    Ward Chatters, PT 10/11/2020, 12:59 PM  [facility] [address] [place], [place], [address] Phone: [phone]   Fax:  [phone]  Name: Thadd Apuzzo MRN: [id number] Date of Birth: 06-05-1960   PHYSICAL THERAPY DISCHARGE SUMMARY  Visits from Start of Care: 8  Current functional level related to goals / functional outcomes: See goals and objective   Remaining deficits: See goals and objective   Education / Equipment: HEP   Patient agrees to discharge. Patient goals were  some met, others did not progress . Patient is being discharged due to did not respond to therapy.

## 2020-10-19 ENCOUNTER — Other Ambulatory Visit: Payer: Self-pay | Admitting: Critical Care Medicine

## 2020-10-19 DIAGNOSIS — M5136 Other intervertebral disc degeneration, lumbar region: Secondary | ICD-10-CM

## 2020-10-31 NOTE — Progress Notes (Signed)
HPI M never smoker followed for OSA, complicated by  Hemorrhoids, HTN, Seizure Disorder, Meralgia Paresthetica, Bipolar, Anxiety Depression, NPSG 06/10/19- AHI 24.2/ hr, 78%, body weight today 240 lbs =======================================================    05/03/20- 60 yoM never smoker followed for OSA, complicated by  Hemorrhoids, HTN, Seizure Disorder, Meralgia Paresthetica, Bipolar, Anxiety Depression, CPAP auto 5-10/ [facility]  ordered 10/30/19 Download- compliance 87%, AHI 1/ hr Body weight today-220 lbs Covid vax-J&J x 1 Flu vax-had Wants to discuss alternatives to CPAP. Treatment options reviewed. Does ok w CPAP but finds it frustrating.  11/01/20-  60 yoM never smoker followed for OSA, complicated by  Hemorrhoids, HTN, Seizure Disorder, Meralgia Paresthetica, Bipolar, Anxiety Depression, CPAP auto 5-10/ [facility]  ordered 10/30/19  AirSense 10 AutoSet Download- compliance 67%, AHI 1.3/ hr Body weight today-204 lbs Covid vax-1 J&J, 2 Phizer Flu vax- -----Pt states new water chamber for CPAP Still gets drowsy and blames his psychiatric medications. Humidifier reservoir for his CPAP machine get sustained despite using distilled water.  Discussed washing it.  ROS-see HPI   + = positive Constitutional:    weight loss, night sweats, fevers, chills, fatigue, lassitude. HEENT:   ++ headaches, difficulty swallowing,+ tooth/dental problems, sore throat,       sneezing, itching, ear ache, nasal congestion, post nasal drip, snoring CV:    chest pain, orthopnea, PND, swelling in lower extremities, anasarca,                                   dizziness, palpitations Resp:   +shortness of breath with exertion or at rest.                productive cough,   non-productive cough, coughing up of blood.              change in color of mucus.  wheezing.   Skin:    rash or lesions. GI:  No-   heartburn, indigestion, abdominal pain, nausea, vomiting, diarrhea,                 change in bowel habits,  loss of appetite GU: dysuria, change in color of urine, no urgency or frequency.   flank pain. MS:   joint pain, stiffness, decreased range of motion, back pain. Neuro-     nothing unusual Psych:  change in mood or affect.  +depression or +anxiety.   memory loss.  OBJ- Physical Exam General- Alert, Oriented, Affect-appropriate, Distress- none acute, + not obese Skin- rash-none, lesions- none, excoriation- none Lymphadenopathy- none Head- atraumatic            Eyes- Gross vision intact, PERRLA, conjunctivae and secretions clear            Ears- Hearing, canals-normal            Nose- Clear, no-Septal dev, mucus, polyps, erosion, perforation             Throat- Mallampati III , mucosa clear , drainage- none, tonsils- atrophic,  + teeth Neck- flexible , trachea midline, no stridor , thyroid nl, carotid no bruit Chest - symmetrical excursion , unlabored           Heart/CV- RRR , no murmur , no gallop  , no rub, nl s1 s2                           - JVD- none , edema- none,  stasis changes- none, varices- none           Lung- clear to P&A, wheeze- none, cough- none , dullness-none, rub- none           Chest wall-  Abd-  Br/ Gen/ Rectal- Not done, not indicated Extrem- + cane Neuro- + speech slow, + sucking on tongue blade in mouth ?frontal lobe

## 2020-11-01 ENCOUNTER — Other Ambulatory Visit: Payer: Self-pay

## 2020-11-01 ENCOUNTER — Ambulatory Visit (INDEPENDENT_AMBULATORY_CARE_PROVIDER_SITE_OTHER): Payer: Medicaid Other | Admitting: Internal Medicine

## 2020-11-01 ENCOUNTER — Encounter: Payer: Self-pay | Admitting: Internal Medicine

## 2020-11-01 VITALS — BP 126/72 | HR 119 | Temp 98.9°F | Ht 71.0 in | Wt 204.8 lb

## 2020-11-01 DIAGNOSIS — F3132 Bipolar disorder, current episode depressed, moderate: Secondary | ICD-10-CM | POA: Diagnosis not present

## 2020-11-01 DIAGNOSIS — Z9989 Dependence on other enabling machines and devices: Secondary | ICD-10-CM

## 2020-11-01 DIAGNOSIS — G4733 Obstructive sleep apnea (adult) (pediatric): Secondary | ICD-10-CM | POA: Diagnosis not present

## 2020-11-01 NOTE — Patient Instructions (Signed)
Order- DME [facility]   please service and replace humidifier. Patient reports tank stained despite distilled water.  Mr Duchene- Keep using your CPAP every night.

## 2020-11-03 ENCOUNTER — Encounter: Payer: Self-pay | Admitting: Surgery

## 2020-11-03 ENCOUNTER — Other Ambulatory Visit: Payer: Self-pay

## 2020-11-03 ENCOUNTER — Ambulatory Visit: Payer: Self-pay

## 2020-11-03 ENCOUNTER — Ambulatory Visit (INDEPENDENT_AMBULATORY_CARE_PROVIDER_SITE_OTHER): Payer: Medicaid Other | Admitting: Surgery

## 2020-11-03 VITALS — BP 135/93 | HR 78 | Ht 71.0 in | Wt 204.8 lb

## 2020-11-03 DIAGNOSIS — M5416 Radiculopathy, lumbar region: Secondary | ICD-10-CM | POA: Diagnosis not present

## 2020-11-03 DIAGNOSIS — M47817 Spondylosis without myelopathy or radiculopathy, lumbosacral region: Secondary | ICD-10-CM

## 2020-11-03 NOTE — Progress Notes (Signed)
Office Visit Note   Patient: Jeremiah Hunter           Date of Birth: 02/13/1960           MRN: [id number] Visit Date: 11/03/2020              Requested by: Storm Frisk, MD [address] [place],  [place] [address] PCP: Storm Frisk, MD   Assessment & Plan: Visit Diagnoses:  1. Spondylosis of lumbosacral region without myelopathy or radiculopathy   2. Radiculopathy, lumbar region     Plan: With patient's ongoing symptoms that have failed conservative treatment I recommend getting a lumbar MRI to rule out HNP/stenosis.  Follow-up in 4 weeks with Dr. Ophelia Charter to discuss results and further treatment options.  Follow-Up Instructions: Return in about 4 weeks (around 12/01/2020) for with dr yates to review lumbar mri.   Orders:  Orders Placed This Encounter  Procedures   XR Lumbar Spine 2-3 Views   MR Lumbar Spine w/o contrast   No orders of the defined types were placed in this encounter.     Procedures: No procedures performed   Clinical Data: No additional findings.   Subjective: Chief Complaint  Patient presents with   Lower Back - Pain    HPI 60 year old black male returns with complaints of low back pain and left lower extremity radiculopathy.  Patient has previously been followed by Dr. Lavada Mesi who is no longer working in this practice.  Last seen in the clinic by me 05/20/2020.  Patient has failed conservative treatment at this point.  Continues have ongoing pain in his low back that radiates into the buttock and into the left thigh.  Numbness and tingling in this area as well.  Symptoms increased with all activity.  No complaints of bowel or bladder incontinence. Review of Systems No current cardiac pulmonary GI GU issues  Objective: Vital Signs: BP (!) 135/93   Pulse 78   Ht 5\' 11"  (1.803 m)   Wt 204 lb 12.8 oz (92.9 kg)   BMI 28.56 kg/m   Physical Exam HENT:     Head: Normocephalic.  Pulmonary:     Effort: No respiratory distress.   Musculoskeletal:     Comments: Gait is somewhat antalgic.  Decreased lumbar flexion-extension due to discomfort.  Positive lumbar paraspinal tenderness.  Positive left-sided notch tenderness.  Pain with left straight leg raise.  No focal motor deficits.  Neurological:     Mental Status: He is alert and oriented to person, place, and time.  Psychiatric:        Mood and Affect: Mood normal.    Ortho Exam  Specialty Comments:  No specialty comments available.  Imaging: No results found.   PMFS History: Patient Active Problem List   Diagnosis Date Noted   DDD (degenerative disc disease), lumbar    Prolapsed internal hemorrhoids, grade 3 09/25/2019   Meralgia paresthetica of left side 09/11/2019   Bipolar affective disorder, currently depressed, moderate ([facility]) 09/11/2019   External hemorrhoids with complication 07/30/2019   Overactive bladder 06/11/2019   OSA (obstructive sleep apnea) 03/24/2019   Essential hypertension 03/24/2019   Anxiety and depression 03/24/2019   Seizure disorder ([facility]) 03/24/2019   Past Medical History:  Diagnosis Date   Acute bilateral low back pain without sciatica 07/30/2019   DDD (degenerative disc disease), lumbar    Enlarged prostate    Hypertension    Severe episode of recurrent major depressive disorder, with psychotic features ([facility]) 03/24/2019  Sleep apnea     Family History  Family history unknown: Yes    Past Surgical History:  Procedure Laterality Date   CYSTECTOMY     HERNIA REPAIR     Social History   Occupational History   Not on file  Tobacco Use   Smoking status: Never   Smokeless tobacco: Never  Vaping Use   Vaping Use: Never used  Substance and Sexual Activity   Alcohol use: Never   Drug use: Never   Sexual activity: Not Currently

## 2020-11-11 ENCOUNTER — Other Ambulatory Visit: Payer: Self-pay | Admitting: Critical Care Medicine

## 2020-11-11 DIAGNOSIS — Z91018 Allergy to other foods: Secondary | ICD-10-CM

## 2020-11-11 MED ORDER — HYDROCHLOROTHIAZIDE 25 MG PO TABS: 25.0000 mg | ORAL_TABLET | Freq: Every day | ORAL | 3 refills | Status: DC

## 2020-11-17 ENCOUNTER — Encounter: Payer: Self-pay | Admitting: Orthopaedic Surgery

## 2020-11-18 ENCOUNTER — Other Ambulatory Visit: Payer: Self-pay | Admitting: Critical Care Medicine

## 2020-11-18 MED ORDER — AMLODIPINE BESYLATE 5 MG PO TABS: 5.0000 mg | ORAL_TABLET | Freq: Every day | ORAL | 1 refills | Status: DC

## 2020-11-23 ENCOUNTER — Ambulatory Visit: Payer: Medicaid Other | Admitting: Orthopaedic Surgery

## 2020-11-29 ENCOUNTER — Ambulatory Visit: Payer: Medicaid Other | Admitting: Critical Care Medicine

## 2020-11-29 ENCOUNTER — Ambulatory Visit
Admission: RE | Admit: 2020-11-29 | Discharge: 2020-11-29 | Disposition: A | Discharge status: Home / Self Care | Payer: Medicaid Other | Source: Ambulatory Visit | Attending: Surgery | Admitting: Surgery

## 2020-11-29 ENCOUNTER — Other Ambulatory Visit: Payer: Self-pay

## 2020-11-29 ENCOUNTER — Ambulatory Visit: Payer: Medicaid Other | Admitting: Orthopaedic Surgery

## 2020-11-29 DIAGNOSIS — M47817 Spondylosis without myelopathy or radiculopathy, lumbosacral region: Secondary | ICD-10-CM

## 2020-11-29 DIAGNOSIS — M5416 Radiculopathy, lumbar region: Secondary | ICD-10-CM

## 2020-12-06 ENCOUNTER — Ambulatory Visit: Payer: Medicaid Other | Admitting: Orthopaedic Surgery

## 2020-12-06 NOTE — Telephone Encounter (Signed)
This pt needs Video visit add on tomorrow or thursday

## 2020-12-07 ENCOUNTER — Other Ambulatory Visit: Payer: Self-pay

## 2020-12-07 ENCOUNTER — Encounter: Payer: Self-pay | Admitting: Orthopaedic Surgery

## 2020-12-07 ENCOUNTER — Ambulatory Visit (INDEPENDENT_AMBULATORY_CARE_PROVIDER_SITE_OTHER): Payer: Medicaid Other | Admitting: Orthopaedic Surgery

## 2020-12-07 DIAGNOSIS — M48061 Spinal stenosis, lumbar region without neurogenic claudication: Secondary | ICD-10-CM | POA: Diagnosis not present
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
## 2020-12-08 ENCOUNTER — Encounter: Payer: Self-pay | Admitting: Critical Care Medicine

## 2020-12-08 ENCOUNTER — Ambulatory Visit: Payer: Medicaid Other | Attending: Critical Care Medicine | Admitting: Critical Care Medicine

## 2020-12-08 DIAGNOSIS — N3281 Overactive bladder: Secondary | ICD-10-CM | POA: Diagnosis not present

## 2020-12-08 DIAGNOSIS — M48061 Spinal stenosis, lumbar region without neurogenic claudication: Secondary | ICD-10-CM

## 2020-12-08 DIAGNOSIS — R296 Repeated falls: Secondary | ICD-10-CM | POA: Diagnosis not present

## 2020-12-08 DIAGNOSIS — G4733 Obstructive sleep apnea (adult) (pediatric): Secondary | ICD-10-CM

## 2020-12-08 DIAGNOSIS — F3132 Bipolar disorder, current episode depressed, moderate: Secondary | ICD-10-CM

## 2020-12-08 DIAGNOSIS — I10 Essential (primary) hypertension: Secondary | ICD-10-CM | POA: Diagnosis not present

## 2020-12-08 NOTE — Assessment & Plan Note (Signed)
Patient using his sleep machine doing well with this

## 2020-12-08 NOTE — Assessment & Plan Note (Signed)
Blood pressure well controlled at orthopedic office no change

## 2020-12-08 NOTE — Assessment & Plan Note (Signed)
As per mental health no changes in medications

## 2020-12-08 NOTE — Assessment & Plan Note (Signed)
Controlled with Oxytrol we will continue same

## 2020-12-08 NOTE — Assessment & Plan Note (Signed)
Degenerative disc disease and lumbar foraminal stenosis no surgical intervention indicated  Continue gabapentin and physical therapy

## 2020-12-08 NOTE — Progress Notes (Signed)
Established Patient Office Visit  Subjective:  Patient ID: Jeremiah Hunter, male    DOB: Jun 17, 1960  Age: 60 y.o. MRN: [id number] Virtual Visit via Video Note  I connected with Jeremiah Hunter  on 12/08/20 at 10AM by a video enabled telemedicine application and verified that I am speaking with the correct person using two identifiers.   Consent:  I discussed the limitations, risks, security and privacy concerns of performing an evaluation and management service by video visit and the availability of in person appointments. I also discussed with the patient that there may be a patient responsible charge related to this service. The patient expressed understanding and agreed to proceed.  Location of patient: Patient's at home  Location of provider: I am in my office  Persons participating in the televisit with the patient.   No one else on the call    History of Present Illness:    CC: Fall with injury to head and slight headache now resolving  HPI Jeremiah Hunter presents for primary care follow-up Patient recently fell while walking in the hallway of his apartment.  He hit his head on the floor.  He had a bit of an abrasion this resolved on its own.  He had some headache for a bit but this is decreased.  He does have a history of falls having fallen about 5 times in the past year.  He has a Rollator and a cane and has received physical therapy in the past.  Patient has no other real complaints at this visit.  We did discuss on this visit dietary recommendations and supplement recommendations  Patient had a visit with orthopedic spine who recommend physical therapy only and did not recommend any surgical intervention.  They did offer epidural injections the patient declined this      Past Medical History:  Diagnosis Date   Acute bilateral low back pain without sciatica 07/30/2019   DDD (degenerative disc disease), lumbar    Enlarged prostate    Hypertension    Severe episode of  recurrent major depressive disorder, with psychotic features ([place]) 03/24/2019   Sleep apnea     Past Surgical History:  Procedure Laterality Date   CYSTECTOMY     HERNIA REPAIR      Family History  Family history unknown: Yes    Social History   Socioeconomic History   Marital status: Single    Spouse name: Not on file   Number of children: Not on file   Years of education: Not on file   Highest education level: Not on file  Occupational History   Not on file  Tobacco Use   Smoking status: Never   Smokeless tobacco: Never  Vaping Use   Vaping Use: Never used  Substance and Sexual Activity   Alcohol use: Never   Drug use: Never   Sexual activity: Not Currently  Other Topics Concern   Not on file  Social History Narrative   Not on file   Social Determinants of Health   Financial Resource Strain: Not on file  Food Insecurity: Not on file  Transportation Needs: Not on file  Physical Activity: Not on file  Stress: Not on file  Social Connections: Not on file  Intimate Partner Violence: Not on file    Outpatient Medications Prior to Visit  Medication Sig Dispense Refill   amLODipine (NORVASC) 5 MG tablet Take 1 tablet (5 mg total) by mouth daily. 60 tablet 1   B Complex-C-E-Zn (EQL STRESS B-COMPLEX  C/ZINC) TABS One a day po. With food.     baclofen (LIORESAL) 10 MG tablet TAKE 1/2 TO 1 TABLET(5 TO 10 MG) BY MOUTH THREE TIMES DAILY AS NEEDED FOR MUSCLE SPASMS 30 tablet 6   cholecalciferol (VITAMIN D3) 25 MCG (1000 UNIT) tablet Take 2,000 Units by mouth daily.     diclofenac Sodium (VOLTAREN) 1 % GEL APPLY 2 GRAMS TOPICALLY TO THE AFFECTED AREA FOUR TIMES DAILY 100 g 2   divalproex (DEPAKOTE ER) 500 MG 24 hr tablet Take 1 tablet (500 mg total) by mouth in the morning and at bedtime. 60 tablet 4   FLUoxetine (PROZAC) 20 MG capsule Take 1 capsule (20 mg total) by mouth daily. 60 capsule 2   gabapentin (NEURONTIN) 300 MG capsule TAKE 2 CAPSULES(600 MG) BY MOUTH THREE  TIMES DAILY 180 capsule 2   hydrocortisone 2.5 % lotion Apply topically 2 (two) times daily. 59 mL 0   lidocaine-prilocaine (EMLA) cream Apply 1 application topically as needed. 30 g 5   Omega-3 Fatty Acids (FISH OIL) 1000 MG CAPS Take by mouth.     OXYTROL 3.9 MG/24HR APPLY 1 PATCH TOPICALLY TO THE SKIN 2 TIMES A WEEK 8 patch 2   QUEtiapine (SEROQUEL) 100 MG tablet Take 1 tablet (100 mg total) by mouth at bedtime. 60 tablet 2   loratadine (CLARITIN) 10 MG tablet Take 1 tablet (10 mg total) by mouth daily. (Patient not taking: Reported on 12/08/2020) 30 tablet 4   No facility-administered medications prior to visit.    Allergies  Allergen Reactions   Losartan Itching    ROS Review of Systems  Constitutional:  Negative for chills, diaphoresis and fever.  HENT:  Negative for congestion, hearing loss, nosebleeds, sore throat and tinnitus.   Eyes:  Negative for photophobia and redness.  Respiratory:  Negative for cough, shortness of breath, wheezing and stridor.   Cardiovascular:  Negative for chest pain, palpitations and leg swelling.  Gastrointestinal:  Negative for abdominal pain, blood in stool, constipation, diarrhea, nausea and vomiting.  Endocrine: Negative for polydipsia.  Genitourinary:  Negative for dysuria, flank pain, frequency, hematuria and urgency.  Musculoskeletal:  Positive for back pain. Negative for myalgias and neck pain.  Skin:  Negative for rash.  Allergic/Immunologic: Negative for environmental allergies.  Neurological:  Positive for headaches. Negative for dizziness, tremors, seizures and weakness.  Hematological:  Does not bruise/bleed easily.  Psychiatric/Behavioral:  Negative for suicidal ideas. The patient is not nervous/anxious.      Objective:    Physical Exam No exam is a video visit patient is in no distress on the video, the patient allowed me to visit his apartment and terming around there is quite a bit of clutter on the floors in the hallways and  in his bedroom There were no vitals taken for this visit. Wt Readings from Last 3 Encounters:  12/07/20 204 lb (92.5 kg)  11/03/20 204 lb 12.8 oz (92.9 kg)  11/01/20 204 lb 12.8 oz (92.9 kg)     There are no preventive care reminders to display for this patient.   There are no preventive care reminders to display for this patient.  Lab Results  Component Value Date   TSH 2.170 03/24/2019   Lab Results  Component Value Date   WBC 4.3 06/23/2020   HGB 14.3 06/23/2020   HCT 43.5 06/23/2020   MCV 88 06/23/2020   PLT 218 06/23/2020   Lab Results  Component Value Date   NA 140 06/23/2020  K 3.5 06/23/2020   CO2 25 06/23/2020   GLUCOSE 99 06/23/2020   BUN 19 06/23/2020   CREATININE 1.01 06/23/2020   BILITOT 0.5 06/23/2020   ALKPHOS 102 06/23/2020   AST 17 06/23/2020   ALT 15 06/23/2020   PROT 7.1 06/23/2020   ALBUMIN 4.3 06/23/2020   CALCIUM 9.9 06/23/2020   ANIONGAP 10 08/20/2019   EGFR 86 06/23/2020   Lab Results  Component Value Date   CHOL 156 06/23/2020   Lab Results  Component Value Date   HDL 50 06/23/2020   Lab Results  Component Value Date   LDLCALC 89 06/23/2020   Lab Results  Component Value Date   TRIG 92 06/23/2020   Lab Results  Component Value Date   CHOLHDL 3.1 06/23/2020   No results found for: HGBA1C    Assessment & Plan:   Problem List Items Addressed This Visit       Cardiovascular and Mediastinum   Essential hypertension    Blood pressure well controlled at orthopedic office no change        Respiratory   OSA (obstructive sleep apnea)    Patient using his sleep machine doing well with this        Musculoskeletal and Integument   Lumbar foraminal stenosis    Degenerative disc disease and lumbar foraminal stenosis no surgical intervention indicated  Continue gabapentin and physical therapy        Genitourinary   Overactive bladder    Controlled with Oxytrol we will continue same        Other   Bipolar  affective disorder, currently depressed, moderate ([place])    As per mental health no changes in medications      Frequent falls    This patient is a Hunter fall risk he has been falling frequently at home  Patient recommended he do the exercises physical therapy as instructed , requested he use the cane and also his Rollator is available for use       No orders of the defined types were placed in this encounter.  Follow Up Instructions: Patient knows a follow-up exam will occur in 3 months   I discussed the assessment and treatment plan with the patient. The patient was provided an opportunity to ask questions and all were answered. The patient agreed with the plan and demonstrated an understanding of the instructions.   The patient was advised to call back or seek an in-person evaluation if the symptoms worsen or if the condition fails to improve as anticipated.  I provided 30 minutes of non-face-to-face time during this encounter  including  median intraservice time , review of notes, labs, imaging, medications  and explaining diagnosis and management to the patient .     Asencion Noble, MD

## 2020-12-08 NOTE — Assessment & Plan Note (Signed)
This patient is a high fall risk he has been falling frequently at home  Patient recommended he do the exercises physical therapy as instructed , requested he use the cane and also his Rollator is available for use

## 2020-12-12 ENCOUNTER — Encounter: Payer: Self-pay | Admitting: Critical Care Medicine

## 2020-12-13 ENCOUNTER — Encounter: Payer: Self-pay | Admitting: Internal Medicine

## 2020-12-13 NOTE — Telephone Encounter (Signed)
Ok to skip CPAP as requested

## 2020-12-13 NOTE — Telephone Encounter (Signed)
CY please advise of pts mychart question.  thanks

## 2020-12-14 ENCOUNTER — Encounter: Payer: Self-pay | Admitting: Critical Care Medicine

## 2020-12-14 MED ORDER — PANTOPRAZOLE SODIUM 40 MG PO TBEC
40.0000 mg | DELAYED_RELEASE_TABLET | Freq: Every day | ORAL | 3 refills | Status: DC
Start: 2020-12-14 — End: 2021-02-16

## 2020-12-19 ENCOUNTER — Encounter: Payer: Self-pay | Admitting: Critical Care Medicine

## 2020-12-19 NOTE — Telephone Encounter (Signed)
Pt needs appt with me in next two weeks face to face for underarm smell /pain

## 2020-12-28 ENCOUNTER — Encounter: Payer: Self-pay | Admitting: Internal Medicine

## 2021-01-04 ENCOUNTER — Encounter: Payer: Self-pay | Admitting: Internal Medicine

## 2021-01-04 ENCOUNTER — Encounter: Payer: Self-pay | Admitting: Critical Care Medicine

## 2021-01-04 NOTE — Telephone Encounter (Signed)
Please advise on note. Thanks

## 2021-01-04 NOTE — Telephone Encounter (Signed)
Yes, ok. Enjoy Christmas with family.

## 2021-01-05 ENCOUNTER — Telehealth: Payer: Self-pay | Admitting: Critical Care Medicine

## 2021-01-05 ENCOUNTER — Encounter: Payer: Self-pay | Admitting: Critical Care Medicine

## 2021-01-05 ENCOUNTER — Encounter: Payer: Self-pay | Admitting: Internal Medicine

## 2021-01-05 MED ORDER — MOLNUPIRAVIR EUA 200MG CAPSULE: 4.0000 | ORAL_CAPSULE | Freq: Two times a day (BID) | ORAL | 0 refills | Status: AC

## 2021-01-05 MED ORDER — AZITHROMYCIN 250 MG PO TABS: ORAL_TABLET | ORAL | 0 refills | Status: DC

## 2021-01-05 NOTE — Telephone Encounter (Signed)
CY please advise. Thanks  I felt so bad yesterday, last night I refuse to sleep with the machine i was scare cause Im stuff nose and throat pain and its gotten badder over night after I took Night shift Mucinex can you order anything maybe help ful at [facility] [place]?   That will be for Christmas Day like to ask if i can go with sleeping with the machine I m because Im invited out of [place] with family

## 2021-01-05 NOTE — Telephone Encounter (Signed)
Outpatient Oral COVID Treatment Note  I connected with Jeremiah Hunter on 01/05/2021/11:49 AM by telephone and verified that I am speaking with the correct person using two identifiers.  I discussed the limitations, risks, security, and privacy concerns of performing an evaluation and management service by telephone and the availability of in person appointments. I also discussed with the patient that there may be a patient responsible charge related to this service. The patient expressed understanding and agreed to proceed.  Patient location: home Provider location: office  Diagnosis: COVID-19 infection  Purpose of visit: Discussion of potential use of Molnupiravir or Paxlovid, a new treatment for mild to moderate COVID-19 viral infection in non-hospitalized patients.   Subjective: Patient is a 60 y.o. male who has been diagnosed with COVID 19 viral infection.  Their symptoms began on 01/04/21 with cough, loss of taste /smell, fatigue.    Pos home covid test this am  Past Medical History:  Diagnosis Date   Acute bilateral low back pain without sciatica 07/30/2019   DDD (degenerative disc disease), lumbar    Enlarged prostate    Hypertension    Severe episode of recurrent major depressive disorder, with psychotic features ([facility]) 03/24/2019   Sleep apnea     Allergies  Allergen Reactions   Losartan Itching     Current Outpatient Medications:    azithromycin (ZITHROMAX) 250 MG tablet, Take two once then one daily until gone, Disp: 6 tablet, Rfl: 0   molnupiravir EUA (LAGEVRIO) 200 mg CAPS capsule, Take 4 capsules (800 mg total) by mouth 2 (two) times daily for 5 days., Disp: 40 capsule, Rfl: 0   amLODipine (NORVASC) 5 MG tablet, Take 1 tablet (5 mg total) by mouth daily., Disp: 60 tablet, Rfl: 1   B Complex-C-E-Zn (EQL STRESS B-COMPLEX C/ZINC) TABS, One a day po. With food., Disp: , Rfl:    baclofen (LIORESAL) 10 MG tablet, TAKE 1/2 TO 1 TABLET(5 TO 10 MG) BY MOUTH THREE TIMES DAILY AS  NEEDED FOR MUSCLE SPASMS, Disp: 30 tablet, Rfl: 6   cholecalciferol (VITAMIN D3) 25 MCG (1000 UNIT) tablet, Take 2,000 Units by mouth daily., Disp: , Rfl:    diclofenac Sodium (VOLTAREN) 1 % GEL, APPLY 2 GRAMS TOPICALLY TO THE AFFECTED AREA FOUR TIMES DAILY, Disp: 100 g, Rfl: 2   divalproex (DEPAKOTE ER) 500 MG 24 hr tablet, Take 1 tablet (500 mg total) by mouth in the morning and at bedtime., Disp: 60 tablet, Rfl: 4   FLUoxetine (PROZAC) 20 MG capsule, Take 1 capsule (20 mg total) by mouth daily., Disp: 60 capsule, Rfl: 2   gabapentin (NEURONTIN) 300 MG capsule, TAKE 2 CAPSULES(600 MG) BY MOUTH THREE TIMES DAILY, Disp: 180 capsule, Rfl: 2   hydrocortisone 2.5 % lotion, Apply topically 2 (two) times daily., Disp: 59 mL, Rfl: 0   lidocaine-prilocaine (EMLA) cream, Apply 1 application topically as needed., Disp: 30 g, Rfl: 5   loratadine (CLARITIN) 10 MG tablet, Take 1 tablet (10 mg total) by mouth daily. (Patient not taking: Reported on 12/08/2020), Disp: 30 tablet, Rfl: 4   Omega-3 Fatty Acids (FISH OIL) 1000 MG CAPS, Take by mouth., Disp: , Rfl:    OXYTROL 3.9 MG/24HR, APPLY 1 PATCH TOPICALLY TO THE SKIN 2 TIMES A WEEK, Disp: 8 patch, Rfl: 2   pantoprazole (PROTONIX) 40 MG tablet, Take 1 tablet (40 mg total) by mouth daily., Disp: 30 tablet, Rfl: 3   QUEtiapine (SEROQUEL) 100 MG tablet, Take 1 tablet (100 mg total) by mouth at bedtime.,  Disp: 60 tablet, Rfl: 2  Objective: Patient appears/sounds stable.  They are in no apparent distress.  Breathing is non labored.  Mood and behavior are normal.  Laboratory Data:  No results found for this or any previous visit (from the past 2160 hour(s)).   Assessment: 60 y.o. male with mild/moderate COVID 19 viral infection diagnosed on Covid 01/05/21 at high risk for progression to severe COVID 19.  Plan:  This patient is a 60 y.o. male that meets the following criteria for Emergency Use Authorization of: Molnupiravir  Age >18 yr SARS-COV-2 positive  test Symptom onset < 5 days Mild-to-moderate COVID disease with high risk for severe progression to hospitalization or death  I have spoken and communicated the following to the patient or parent/caregiver regarding: Molnupiravir is an unapproved drug that is authorized for use under an Emergency Use Authorization.  There are no adequate, approved, available products for the treatment of COVID-19 in adults who have mild-to-moderate COVID-19 and are at high risk for progressing to severe COVID-19, including hospitalization or death. Other therapeutics are currently authorized. For additional information on all products authorized for treatment or prevention of COVID-19, please see [email or web address]://[email or web address]-[email or web address]  There are benefits and risks of taking this treatment as outlined in the Fact Sheet for Patients and Caregivers.  Fact Sheet for Patients and Caregivers was reviewed with patient. A hard copy will be provided to patient from pharmacy prior to the patient receiving treatment. Patients should continue to self-isolate and use infection control measures (e.g., wear mask, isolate, social distance, avoid sharing personal items, clean and disinfect high touch surfaces, and frequent handwashing) according to CDC guidelines.  The patient or parent/caregiver has the option to accept or refuse treatment. [employer] has established a pregnancy surveillance program. Females of childbearing potential should use a reliable method of contraception correctly and consistently, as applicable, for the duration of treatment and for 4 days after the last dose of Molnupiravir. Males of reproductive potential who are sexually active with females of childbearing potential should use a reliable method of contraception correctly and consistently during treatment and for at least 3 months after the last  dose. Pregnancy status and risk was assessed. Patient verbalized understanding of precautions.  After reviewing above information with the patient, the patient agrees to receive molnupiravir.  Follow up instructions:    Take prescription BID x 5 days as directed Reach out to pharmacist for counseling on medication if desired For concerns regarding further COVID symptoms please follow up with your PCP or urgent care For urgent or life-threatening issues, seek care at your local emergency department  The patient was provided an opportunity to ask questions, and all were answered. The patient agreed with the plan and demonstrated an understanding of the instructions.   Script sent to [facility]  and opted to pick up RX.  The patient was advised to call their PCP or seek an in-person evaluation if the symptoms worsen or if the condition fails to improve as anticipated.   I provided 10 minutes of non face-to-face telephone visit time during this encounter, and > 50% was spent counseling as documented under my assessment & plan.  Shan Levans, MD 01/05/2021 /11:49 AM

## 2021-01-05 NOTE — Telephone Encounter (Signed)
C/o sore throat, no fever, sl cough productive yellow color. Not short of breath.  Not smelling or taste and is new.    No muscle ache.  Feels fatigue.   Pt states he will do a covid test.  He is vaccinated and boosted

## 2021-01-05 NOTE — Telephone Encounter (Signed)
This could be flu, Covid or just a cold.  Please send him to be tested for flu and Covid Suggest Nyquil cold and flu at night, Dayquil in daytime Stay well hydrated Try using a nasal rinse like NeilMed

## 2021-01-06 ENCOUNTER — Encounter: Payer: Self-pay | Admitting: Critical Care Medicine

## 2021-01-06 ENCOUNTER — Telehealth: Payer: Self-pay | Admitting: Critical Care Medicine

## 2021-01-06 ENCOUNTER — Other Ambulatory Visit: Payer: Self-pay | Admitting: Critical Care Medicine

## 2021-01-06 MED ORDER — PROMETHAZINE-DM 6.25-15 MG/5ML PO SYRP: 5.0000 mL | ORAL_SOLUTION | Freq: Four times a day (QID) | ORAL | 0 refills | Status: DC | PRN

## 2021-01-06 NOTE — Telephone Encounter (Signed)
Copied from CRM [id number]. Topic: General - Other >> Jan 06, 2021 12:01 PM Gaetana Michaelis A wrote: Reason for CRM: The patient has been in contact with their pharmacy and been told that their promethazine-dextromethorphan (PROMETHAZINE-DM) 6.25-15 MG/5ML syrup [[id number]]  was not submitted to them  The patient would like for a member of staff to please contact their pharmacy to confirm the submission of the prescription   [id number] Gin[place] Otto,  - [address] AT [facility] [address] [place] [address] Phone: [phone] Fax: [phone]  Please contact further if needed

## 2021-01-06 NOTE — Telephone Encounter (Signed)
Called pt and he has medication, pt called pharmacy and he got his medication

## 2021-01-08 ENCOUNTER — Encounter: Payer: Self-pay | Admitting: Critical Care Medicine

## 2021-01-10 ENCOUNTER — Encounter: Payer: Self-pay | Admitting: Critical Care Medicine

## 2021-01-11 ENCOUNTER — Encounter: Payer: Self-pay | Admitting: Critical Care Medicine

## 2021-01-11 ENCOUNTER — Ambulatory Visit: Payer: Self-pay | Admitting: *Deleted

## 2021-01-11 ENCOUNTER — Other Ambulatory Visit: Payer: Self-pay | Admitting: Critical Care Medicine

## 2021-01-11 NOTE — Telephone Encounter (Signed)
Jeremiah Hunter messaged the pt and go ahead and call him tell him he will likely stay positive on covid test for another week and symptoms will gradually get better.    Observe for now, if worsens with severe chest pain he should go to ED for evaluation

## 2021-01-11 NOTE — Telephone Encounter (Signed)
Pt called stating that he tested positive for covid on 01/05/21. He states that he received medication for it to help with his symptoms. He states that he tested positive again yesterday and the test still tested positive. He states that he is now experiencing brown mucus/ some tiny tinges of blood, some hot flashes, loose bowels. Please advise,       Chief Complaint: Runny nose Symptoms: Bright red tiny flecks of blood on tissue from nose, brownish drainage Frequency: This AM Pertinent Negatives: Patient denies fever, SOB Disposition: '[]' ED /'[]' Urgent Care (no appt availability in office) / '[x]' Appointment(In office/virtual)/ '[]'  [place] [facility]/ '[]' Home Care/ '[]' Refused Recommended Disposition  Additional Notes: Reports tested covid positive again yesterday. Several My Chart messages to Dr. Joya Gaskins this AM. Home care advise given, pt verbalizes understanding. Pt would like Dr. Joya Gaskins to address. Assured pt NT would route to practice for PCPs review.   Reason for Disposition  [1] COVID-19 diagnosed by positive lab test (e.g., PCR, rapid self-test kit) AND [2] mild symptoms (e.g., cough, fever, others) AND [0] no complications or SOB  Answer Assessment - Initial Assessment Questions 1. COVID-19 DIAGNOSIS: "Who made your COVID-19 diagnosis?" "Was it confirmed by a positive lab test or self-test?" If not diagnosed by a doctor (or NP/PA), ask "Are there lots of cases (community spread) where you live?" Note: See public health department website, if unsure.      2. COVID-19 EXPOSURE: "Was there any known exposure to COVID before the symptoms began?" CDC Definition of close contact: within 6 feet (2 meters) for a total of 15 minutes or more over a 24-hour period.       3. ONSET: "When did the COVID-19 symptoms start?"       4. WORST SYMPTOM: "What is your worst symptom?" (e.g., cough, fever, shortness of breath, muscle aches)      5. COUGH: "Do you have a cough?" If Yes, ask: "How bad is the  cough?"        6. FEVER: "Do you have a fever?" If Yes, ask: "What is your temperature, how was it measured, and when did it start?"     No 7. RESPIRATORY STATUS: "Describe your breathing?" (e.g., shortness of breath, wheezing, unable to speak)      no 8. BETTER-SAME-WORSE: "Are you getting better, staying the same or getting worse compared to yesterday?"  If getting worse, ask, "In what way?"     *No Answer* 9. HIGH RISK DISEASE: "Do you have any chronic medical problems?" (e.g., asthma, heart or lung disease, weak immune system, obesity, etc.)     *No Answer* 10. VACCINE: "Have you had the COVID-19 vaccine?" If Yes, ask: "Which one, how many shots, when did you get it?"       *No Answer* 11. BOOSTER: "Have you received your COVID-19 booster?" If Yes, ask: "Which one and when did you get it?"       *No Answer*  13. OTHER SYMPTOMS: "Do you have any other symptoms?"  (e.g., chills, fatigue, headache, loss of smell or taste, muscle pain, sore throat)        14. O2 SATURATION MONITOR:  "Do you use an oxygen saturation monitor (pulse oximeter) at home?" If Yes, ask "What is your reading (oxygen level) today?" "What is your usual oxygen saturation reading?" (e.g., 95%)       *No Answer*  Protocols used: Coronavirus (COVID-19) Diagnosed or Suspected-A-AH

## 2021-01-11 NOTE — Telephone Encounter (Signed)
Medication not assigned to protocol.

## 2021-01-12 ENCOUNTER — Encounter: Payer: Self-pay | Admitting: Critical Care Medicine

## 2021-01-12 NOTE — Telephone Encounter (Signed)
I spoke to pt and gave him isolation guidance   he is resolving his covid illness

## 2021-01-12 NOTE — Telephone Encounter (Signed)
Called pt and left a vm on doctors note

## 2021-01-15 ENCOUNTER — Encounter: Payer: Self-pay | Admitting: Critical Care Medicine

## 2021-01-16 ENCOUNTER — Encounter: Payer: Self-pay | Admitting: Critical Care Medicine

## 2021-01-17 NOTE — Telephone Encounter (Signed)
Pt called this morning saying he is still testing positive for covid and having symptoms.  He wants to know if Dr. Delford Field can do something to help him.  CB# [phone]

## 2021-01-18 ENCOUNTER — Other Ambulatory Visit: Payer: Self-pay

## 2021-01-18 ENCOUNTER — Encounter (HOSPITAL_COMMUNITY): Payer: Self-pay | Admitting: Emergency Medicine

## 2021-01-18 ENCOUNTER — Ambulatory Visit (HOSPITAL_COMMUNITY)
Admission: EM | Admit: 2021-01-18 | Discharge: 2021-01-18 | Disposition: A | Discharge status: Home / Self Care | Payer: Medicaid Other | Attending: Internal Medicine | Admitting: Internal Medicine

## 2021-01-18 ENCOUNTER — Telehealth: Payer: Self-pay | Admitting: Critical Care Medicine

## 2021-01-18 DIAGNOSIS — J209 Acute bronchitis, unspecified: Secondary | ICD-10-CM | POA: Insufficient documentation

## 2021-01-18 LAB — CBC WITH DIFFERENTIAL/PLATELET
Abs Immature Granulocytes: 0.01 10*3/uL (ref 0.00–0.07)
Basophils Absolute: 0 10*3/uL (ref 0.0–0.1)
Basophils Relative: 1 %
Eosinophils Absolute: 0.1 10*3/uL (ref 0.0–0.5)
Eosinophils Relative: 3 %
HCT: 42 % (ref 39.0–52.0)
Hemoglobin: 14.3 g/dL (ref 13.0–17.0)
Immature Granulocytes: 0 %
Lymphocytes Relative: 47 %
Lymphs Abs: 1.6 10*3/uL (ref 0.7–4.0)
MCH: 29.5 pg (ref 26.0–34.0)
MCHC: 34 g/dL (ref 30.0–36.0)
MCV: 86.8 fL (ref 80.0–100.0)
Monocytes Absolute: 0.3 10*3/uL (ref 0.1–1.0)
Monocytes Relative: 9 %
Neutro Abs: 1.4 10*3/uL — ABNORMAL LOW (ref 1.7–7.7)
Neutrophils Relative %: 40 %
Platelets: 203 10*3/uL (ref 150–400)
RBC: 4.84 MIL/uL (ref 4.22–5.81)
RDW: 14.7 % (ref 11.5–15.5)
WBC: 3.4 10*3/uL — ABNORMAL LOW (ref 4.0–10.5)
nRBC: 0 % (ref 0.0–0.2)

## 2021-01-18 LAB — BASIC METABOLIC PANEL
Anion gap: 8 (ref 5–15)
BUN: 15 mg/dL (ref 6–20)
CO2: 28 mmol/L (ref 22–32)
Calcium: 9.3 mg/dL (ref 8.9–10.3)
Chloride: 102 mmol/L (ref 98–111)
Creatinine, Ser: 1.05 mg/dL (ref 0.61–1.24)
GFR, Estimated: >60 mL/min (ref >60)
Glucose, Bld: 91 mg/dL (ref 70–99)
Potassium: 3.8 mmol/L (ref 3.5–5.1)
Sodium: 138 mmol/L (ref 135–145)

## 2021-01-18 NOTE — Telephone Encounter (Signed)
Copied from CRM [id number]. Topic: General - Call Back - No Documentation >> Jan 17, 2021  2:50 PM Randol Kern wrote: Reason for CRM: Pt says he was told that someone from the office would follow up with him today about his messages with PCP. Please advise, tried calling office.

## 2021-01-18 NOTE — Telephone Encounter (Signed)
Ok thank you 

## 2021-01-18 NOTE — Telephone Encounter (Signed)
Called patient and answered his question. Dr.Wright he also stated that a while back someone was supposed to call him for digestion to do some test. Looked in chart but didn't see anything for gastro.

## 2021-01-18 NOTE — Discharge Instructions (Addendum)
We will call with recommendations if labs are abnormal Your lung sounds clear There is no indication for antibiotics at this time Continue taking allergy medications.

## 2021-01-18 NOTE — ED Triage Notes (Addendum)
Started 01/05/2021.  Patient did contact pcp and did perform home covid test and it was positive.  Patient is not feeling as bad, sneezing, patient is blowing slightly brown secretions, mouth, cheek is sore.  Throat and cough has decreased.  Patient feels fatigue.  Patient wants an antibiotic

## 2021-01-18 NOTE — ED Provider Notes (Signed)
[facility]    CSN: [id number] Arrival date & time: 01/18/21  0803      History   Chief Complaint Chief Complaint  Patient presents with   Sore Throat    HPI Jeremiah Hunter is a 60 y.o. male comes to the urgent care with complaints of sneezing, nasal itching, cough productive of dark sputum.  Patient was diagnosed with COVID-19 on 12/15.  Patient has completed a course of azithromycin within the past week and a half.  He comes in complaining of above-mentioned symptoms.  He denies any shortness of breath, sore throat or wheezing.  He has a history of seasonal allergies and takes medication for that.  No nausea, vomiting or diarrhea.  Cough is not debilitating.  He complains of fatigue.  No diarrhea.Marland Kitchen   HPI  Past Medical History:  Diagnosis Date   Acute bilateral low back pain without sciatica 07/30/2019   DDD (degenerative disc disease), lumbar    Enlarged prostate    Hypertension    Severe episode of recurrent major depressive disorder, with psychotic features (HCC) 03/24/2019   Sleep apnea     Patient Active Problem List   Diagnosis Date Noted   Frequent falls 12/08/2020   Lumbar foraminal stenosis 12/07/2020   DDD (degenerative disc disease), lumbar    Prolapsed internal hemorrhoids, grade 3 09/25/2019   Meralgia paresthetica of left side 09/11/2019   Bipolar affective disorder, currently depressed, moderate (HCC) 09/11/2019   External hemorrhoids with complication 07/30/2019   Overactive bladder 06/11/2019   OSA (obstructive sleep apnea) 03/24/2019   Essential hypertension 03/24/2019   Seizure disorder (HCC) 03/24/2019    Past Surgical History:  Procedure Laterality Date   CYSTECTOMY     HERNIA REPAIR         Home Medications    Prior to Admission medications   Medication Sig Start Date End Date Taking? Authorizing Provider  amLODipine (NORVASC) 5 MG tablet Take 1 tablet (5 mg total) by mouth daily. 11/18/20   Storm Frisk, MD  B  Complex-C-E-Zn (EQL STRESS B-COMPLEX C/ZINC) TABS One a day po. With food. 09/11/19   Dohmeier, Porfirio Mylar, MD  baclofen (LIORESAL) 10 MG tablet TAKE 1/2 TO 1 TABLET(5 TO 10 MG) BY MOUTH THREE TIMES DAILY AS NEEDED FOR MUSCLE SPASMS 09/27/20   Storm Frisk, MD  cholecalciferol (VITAMIN D3) 25 MCG (1000 UNIT) tablet Take 2,000 Units by mouth daily.    [provider]  diclofenac Sodium (VOLTAREN) 1 % GEL APPLY 2 GRAMS TOPICALLY TO THE AFFECTED AREA FOUR TIMES DAILY 09/23/20   Storm Frisk, MD  divalproex (DEPAKOTE ER) 500 MG 24 hr tablet Take 1 tablet (500 mg total) by mouth in the morning and at bedtime. 06/23/20   Storm Frisk, MD  FLUoxetine (PROZAC) 20 MG capsule Take 1 capsule (20 mg total) by mouth daily. 06/23/20   Storm Frisk, MD  gabapentin (NEURONTIN) 300 MG capsule TAKE 2 CAPSULES(600 MG) BY MOUTH THREE TIMES DAILY 08/30/20   Storm Frisk, MD  hydrocortisone 2.5 % lotion Apply topically 2 (two) times daily. 10/06/20   Couture, Cortni S, PA-C  lidocaine-prilocaine (EMLA) cream Apply 1 application topically as needed. 09/27/20   Storm Frisk, MD  loratadine (CLARITIN) 10 MG tablet Take 1 tablet (10 mg total) by mouth daily. Patient not taking: Reported on 12/08/2020 06/23/20   Storm Frisk, MD  Omega-3 Fatty Acids (FISH OIL) 1000 MG CAPS Take by mouth.    [provider]  OXYTROL 3.9 MG/24HR APPLY 1 PATCH TOPICALLY TO THE SKIN 2 TIMES A WEEK 09/27/20   Storm Frisk, MD  pantoprazole (PROTONIX) 40 MG tablet Take 1 tablet (40 mg total) by mouth daily. 12/14/20   Storm Frisk, MD  promethazine-dextromethorphan (PROMETHAZINE-DM) 6.25-15 MG/5ML syrup Take 5 mLs by mouth 4 (four) times daily as needed for cough. Patient not taking: Reported on 01/18/2021 01/06/21   Storm Frisk, MD  QUEtiapine (SEROQUEL) 100 MG tablet Take 1 tablet (100 mg total) by mouth at bedtime. 06/23/20   Storm Frisk, MD    Family History Family History  Family history  unknown: Yes    Social History Social History   Tobacco Use   Smoking status: Never   Smokeless tobacco: Never  Vaping Use   Vaping Use: Never used  Substance Use Topics   Alcohol use: Never   Drug use: Never     Allergies   Losartan   Review of Systems Review of Systems  Constitutional: Negative.   HENT:  Positive for congestion, rhinorrhea and sneezing. Negative for postnasal drip and voice change.   Respiratory:  Negative for shortness of breath and wheezing.   Cardiovascular: Negative.   Gastrointestinal: Negative.   Genitourinary: Negative.   Neurological: Negative.  Negative for headaches.    Physical Exam Triage Vital Signs ED Triage Vitals  Enc Vitals Group     BP 01/18/21 0832 129/86     Pulse Rate 01/18/21 0832 82     Resp 01/18/21 0832 20     Temp 01/18/21 0832 98.4 F (36.9 C)     Temp Source 01/18/21 0832 Oral     SpO2 01/18/21 0832 98 %     Weight --      Height --      Head Circumference --      Peak Flow --      Pain Score 01/18/21 0828 0     Pain Loc --      Pain Edu? --      Excl. in GC? --    No data found.  Updated Vital Signs BP 129/86 (BP Location: Right Arm) Comment: large cuff   Pulse 82    Temp 98.4 F (36.9 C) (Oral)    Resp 20    SpO2 98%   Visual Acuity Right Eye Distance:   Left Eye Distance:   Bilateral Distance:    Right Eye Near:   Left Eye Near:    Bilateral Near:     Physical Exam Vitals and nursing note reviewed.  Constitutional:      General: He is not in acute distress. HENT:     Right Ear: Tympanic membrane normal.     Left Ear: Tympanic membrane normal.     Mouth/Throat:     Mouth: Mucous membranes are moist.  Cardiovascular:     Rate and Rhythm: Normal rate and regular rhythm.  Pulmonary:     Effort: Pulmonary effort is normal.     Breath sounds: Normal breath sounds.  Abdominal:     Palpations: Abdomen is soft.  Musculoskeletal:     Cervical back: Normal range of motion.  Skin:    General:  Skin is warm.  Neurological:     Mental Status: He is alert.     UC Treatments / Results  Labs (all labs ordered are listed, but only abnormal results are displayed) Labs Reviewed  CBC WITH DIFFERENTIAL/PLATELET  BASIC METABOLIC PANEL    EKG  Radiology No results found.  Procedures Procedures (including critical care time)  Medications Ordered in UC Medications - No data to display  Initial Impression / Assessment and Plan / UC Course  I have reviewed the triage vital signs and the nursing notes.  Pertinent labs & imaging results that were available during my care of the patient were reviewed by me and considered in my medical decision making (see chart for details).     1.  Acute bronchitis with fatigue: No indication for antibiotics at this time Patient has completed antibiotics recently CBC, BMP We will call patient with recommendations if labs are abnormal Maintain adequate hydration Start allergy medications Follow-up with primary care physician. Final Clinical Impressions(s) / UC Diagnoses   Final diagnoses:  Acute bronchitis, unspecified organism     Discharge Instructions      We will call with recommendations if labs are abnormal Your lung sounds clear There is no indication for antibiotics at this time Continue taking allergy medications.   ED Prescriptions   None    PDMP not reviewed this encounter.   Merrilee Jansky, MD 01/18/21 [phone]

## 2021-01-18 NOTE — Telephone Encounter (Signed)
This was for a colonoscopy  I see he is in the work queue

## 2021-01-19 ENCOUNTER — Encounter: Payer: Self-pay | Admitting: Gastroenterology

## 2021-01-19 ENCOUNTER — Encounter: Payer: Self-pay | Admitting: Internal Medicine

## 2021-01-19 NOTE — Telephone Encounter (Signed)
FYI for CY.  

## 2021-01-20 ENCOUNTER — Encounter: Payer: Self-pay | Admitting: Orthopaedic Surgery

## 2021-01-21 ENCOUNTER — Encounter: Payer: Self-pay | Admitting: Critical Care Medicine

## 2021-01-24 NOTE — Telephone Encounter (Signed)
noted 

## 2021-01-26 ENCOUNTER — Other Ambulatory Visit: Payer: Self-pay

## 2021-01-26 ENCOUNTER — Encounter: Payer: Self-pay | Admitting: Critical Care Medicine

## 2021-01-26 ENCOUNTER — Ambulatory Visit: Payer: Medicaid Other | Attending: Critical Care Medicine | Admitting: Critical Care Medicine

## 2021-01-26 VITALS — BP 137/94 | HR 91 | Resp 16 | Wt 208.4 lb

## 2021-01-26 DIAGNOSIS — L299 Pruritus, unspecified: Secondary | ICD-10-CM | POA: Insufficient documentation

## 2021-01-26 DIAGNOSIS — M5136 Other intervertebral disc degeneration, lumbar region: Secondary | ICD-10-CM | POA: Diagnosis not present

## 2021-01-26 DIAGNOSIS — R296 Repeated falls: Secondary | ICD-10-CM

## 2021-01-26 DIAGNOSIS — F3132 Bipolar disorder, current episode depressed, moderate: Secondary | ICD-10-CM

## 2021-01-26 DIAGNOSIS — M48061 Spinal stenosis, lumbar region without neurogenic claudication: Secondary | ICD-10-CM

## 2021-01-26 DIAGNOSIS — G4733 Obstructive sleep apnea (adult) (pediatric): Secondary | ICD-10-CM | POA: Diagnosis not present

## 2021-01-26 DIAGNOSIS — I10 Essential (primary) hypertension: Secondary | ICD-10-CM

## 2021-01-26 DIAGNOSIS — F333 Major depressive disorder, recurrent, severe with psychotic symptoms: Secondary | ICD-10-CM

## 2021-01-26 MED ORDER — HYDROXYZINE PAMOATE 25 MG PO CAPS: 25.0000 mg | ORAL_CAPSULE | Freq: Three times a day (TID) | ORAL | 1 refills | Status: DC | PRN

## 2021-01-26 NOTE — Assessment & Plan Note (Signed)
Continue using cane. Exercise safety at gym with new exercise regimen and get instructions prior to using equipment.

## 2021-01-26 NOTE — Assessment & Plan Note (Signed)
Continue using CPAP machine; patient is doing well with this.

## 2021-01-26 NOTE — Assessment & Plan Note (Signed)
Blood pressure somewhat elevated today; no changes in medications at this time, reevaluate at next visit.

## 2021-01-26 NOTE — Assessment & Plan Note (Signed)
Continue Neurontin and Baclofen. PT completed, no further PT scheduled at this time.

## 2021-01-26 NOTE — Progress Notes (Deleted)
Established Patient Office Visit  Subjective:  Patient ID: Jeremiah Hunter, male    DOB: [birth year]  Age: 61 y.o. MRN: [id number]  CC: No chief complaint on file.   HPI Jahzion Brogden presents for ***  Past Medical History:  Diagnosis Date   Acute bilateral low back pain without sciatica 07/30/2019   DDD (degenerative disc disease), lumbar    Enlarged prostate    Hypertension    Severe episode of recurrent major depressive disorder, with psychotic features ([place]) 03/24/2019   Sleep apnea     Past Surgical History:  Procedure Laterality Date   CYSTECTOMY     HERNIA REPAIR      Family History  Family history unknown: Yes    Social History   Socioeconomic History   Marital status: Single    Spouse name: Not on file   Number of children: Not on file   Years of education: Not on file   Highest education level: Not on file  Occupational History   Not on file  Tobacco Use   Smoking status: Never   Smokeless tobacco: Never  Vaping Use   Vaping Use: Never used  Substance and Sexual Activity   Alcohol use: Never   Drug use: Never   Sexual activity: Not Currently  Other Topics Concern   Not on file  Social History Narrative   Not on file   Social Determinants of Health   Financial Resource Strain: Not on file  Food Insecurity: Not on file  Transportation Needs: Not on file  Physical Activity: Not on file  Stress: Not on file  Social Connections: Not on file  Intimate Partner Violence: Not on file    Outpatient Medications Prior to Visit  Medication Sig Dispense Refill   amLODipine (NORVASC) 5 MG tablet Take 1 tablet (5 mg total) by mouth daily. 60 tablet 1   B Complex-C-E-Zn (EQL STRESS B-COMPLEX C/ZINC) TABS One a day po. With food.     baclofen (LIORESAL) 10 MG tablet TAKE 1/2 TO 1 TABLET(5 TO 10 MG) BY MOUTH THREE TIMES DAILY AS NEEDED FOR MUSCLE SPASMS 30 tablet 6   cholecalciferol (VITAMIN D3) 25 MCG (1000 UNIT) tablet Take 2,000 Units by mouth daily.      diclofenac Sodium (VOLTAREN) 1 % GEL APPLY 2 GRAMS TOPICALLY TO THE AFFECTED AREA FOUR TIMES DAILY 100 g 2   divalproex (DEPAKOTE ER) 500 MG 24 hr tablet Take 1 tablet (500 mg total) by mouth in the morning and at bedtime. 60 tablet 4   FLUoxetine (PROZAC) 20 MG capsule Take 1 capsule (20 mg total) by mouth daily. 60 capsule 2   gabapentin (NEURONTIN) 300 MG capsule TAKE 2 CAPSULES(600 MG) BY MOUTH THREE TIMES DAILY 180 capsule 2   hydrocortisone 2.5 % lotion Apply topically 2 (two) times daily. 59 mL 0   lidocaine-prilocaine (EMLA) cream Apply 1 application topically as needed. 30 g 5   loratadine (CLARITIN) 10 MG tablet Take 1 tablet (10 mg total) by mouth daily. (Patient not taking: Reported on 12/08/2020) 30 tablet 4   Omega-3 Fatty Acids (FISH OIL) 1000 MG CAPS Take by mouth.     OXYTROL 3.9 MG/24HR APPLY 1 PATCH TOPICALLY TO THE SKIN 2 TIMES A WEEK 8 patch 2   pantoprazole (PROTONIX) 40 MG tablet Take 1 tablet (40 mg total) by mouth daily. 30 tablet 3   promethazine-dextromethorphan (PROMETHAZINE-DM) 6.25-15 MG/5ML syrup Take 5 mLs by mouth 4 (four) times daily as needed for cough. (Patient not  taking: Reported on 01/18/2021) 118 mL 0   QUEtiapine (SEROQUEL) 100 MG tablet Take 1 tablet (100 mg total) by mouth at bedtime. 60 tablet 2   No facility-administered medications prior to visit.    Allergies  Allergen Reactions   Losartan Itching    ROS Review of Systems    Objective:    Physical Exam  There were no vitals taken for this visit. Wt Readings from Last 3 Encounters:  12/07/20 204 lb (92.5 kg)  11/03/20 204 lb 12.8 oz (92.9 kg)  11/01/20 204 lb 12.8 oz (92.9 kg)     There are no preventive care reminders to display for this patient.  There are no preventive care reminders to display for this patient.  Lab Results  Component Value Date   TSH 2.170 03/24/2019   Lab Results  Component Value Date   WBC 3.4 (L) 01/18/2021   HGB 14.3 01/18/2021   HCT 42.0  01/18/2021   MCV 86.8 01/18/2021   PLT 203 01/18/2021   Lab Results  Component Value Date   NA 138 01/18/2021   K 3.8 01/18/2021   CO2 28 01/18/2021   GLUCOSE 91 01/18/2021   BUN 15 01/18/2021   CREATININE 1.05 01/18/2021   BILITOT 0.5 06/23/2020   ALKPHOS 102 06/23/2020   AST 17 06/23/2020   ALT 15 06/23/2020   PROT 7.1 06/23/2020   ALBUMIN 4.3 06/23/2020   CALCIUM 9.3 01/18/2021   ANIONGAP 8 01/18/2021   EGFR 86 06/23/2020   Lab Results  Component Value Date   CHOL 156 06/23/2020   Lab Results  Component Value Date   HDL 50 06/23/2020   Lab Results  Component Value Date   LDLCALC 89 06/23/2020   Lab Results  Component Value Date   TRIG 92 06/23/2020   Lab Results  Component Value Date   CHOLHDL 3.1 06/23/2020   No results found for: HGBA1C    Assessment & Plan:   Problem List Items Addressed This Visit   None   No orders of the defined types were placed in this encounter.   Follow-up: No follow-ups on file.    Asencion Noble, MD

## 2021-01-26 NOTE — Patient Instructions (Signed)
No change in medications except a prescription for hydoxyzine to take three times a day as needed for itching   You can go to the gym and :  Use treadmill level ground, slow speed 1.5  walking , hold on to hand rails Use ellipitical machine , have staff show you how to use, very slow no resistance Level walking with cane  Use very light weights on exercise your arms, no military presses no heavy lifting using shoulders or weights on shoulders   Return Dr Joya Gaskins 3 months

## 2021-01-26 NOTE — Assessment & Plan Note (Signed)
Continue follow-ups with mental/behavioral health.

## 2021-01-26 NOTE — Assessment & Plan Note (Signed)
Hydroxyzine prescribed to help with itching symptoms.

## 2021-01-26 NOTE — Assessment & Plan Note (Signed)
No surgical intervention being pursued at this time. Continue with Neurontin and Baclofen for pain management.   May begin exercise regimen at new gym. Suggestions include walking on treadmill at 1.5 mph, elliptical, stationary bike. No weights heavier than 10 lbs should be used, and no military press shoulder type movements.

## 2021-01-26 NOTE — Progress Notes (Signed)
Established Patient Office Visit  Subjective:  Patient ID: Jeremiah Hunter, male    DOB: Feb 16, 1960  Age: 61 y.o. MRN: [id number]  CC:  Chief Complaint  Patient presents with   Hypertension    HPI Jeremiah Hunter is a 61 year old man who presents for primary care follow up today. His only acute complaint at this visit is some bodily itching, particularly on the right side of his neck and shoulder.   Jeremiah Hunter has a history of low back pain and degenerative disc disease. He received an MRI for this and has completed physical therapy as recommended by orthopedics. He has joined a gym recently and is interested and motivated to begin exercising more frequently.   His hypertension is currently being managed with Amlodipine and he takes this daily. He denies taking home blood pressures at this time. His sleep apnea also remains well managed with the use of his CPAP machine at home.  Patient did mention that his mood has been low lately, as he has had multiple family members and friends pass away recently. He also has some worries surrounding his safety at his apartment.   Past Medical History:  Diagnosis Date   Acute bilateral low back pain without sciatica 07/30/2019   DDD (degenerative disc disease), lumbar    Enlarged prostate    Hypertension    Severe episode of recurrent major depressive disorder, with psychotic features ([place]) 03/24/2019   Sleep apnea     Past Surgical History:  Procedure Laterality Date   CYSTECTOMY     HERNIA REPAIR      Family History  Family history unknown: Yes    Social History   Socioeconomic History   Marital status: Single    Spouse name: Not on file   Number of children: Not on file   Years of education: Not on file   Highest education level: Not on file  Occupational History   Not on file  Tobacco Use   Smoking status: Never   Smokeless tobacco: Never  Vaping Use   Vaping Use: Never used  Substance and Sexual Activity   Alcohol use: Never    Drug use: Never   Sexual activity: Not Currently  Other Topics Concern   Not on file  Social History Narrative   Not on file   Social Determinants of Health   Financial Resource Strain: Not on file  Food Insecurity: Not on file  Transportation Needs: Not on file  Physical Activity: Not on file  Stress: Not on file  Social Connections: Not on file  Intimate Partner Violence: Not on file    Outpatient Medications Prior to Visit  Medication Sig Dispense Refill   amLODipine (NORVASC) 5 MG tablet Take 1 tablet (5 mg total) by mouth daily. 60 tablet 1   baclofen (LIORESAL) 10 MG tablet TAKE 1/2 TO 1 TABLET(5 TO 10 MG) BY MOUTH THREE TIMES DAILY AS NEEDED FOR MUSCLE SPASMS 30 tablet 6   cholecalciferol (VITAMIN D3) 25 MCG (1000 UNIT) tablet Take 2,000 Units by mouth daily.     diclofenac Sodium (VOLTAREN) 1 % GEL APPLY 2 GRAMS TOPICALLY TO THE AFFECTED AREA FOUR TIMES DAILY 100 g 2   divalproex (DEPAKOTE ER) 500 MG 24 hr tablet Take 1 tablet (500 mg total) by mouth in the morning and at bedtime. 60 tablet 4   FLUoxetine (PROZAC) 20 MG capsule Take 1 capsule (20 mg total) by mouth daily. 60 capsule 2   gabapentin (NEURONTIN) 300  MG capsule TAKE 2 CAPSULES(600 MG) BY MOUTH THREE TIMES DAILY 180 capsule 2   hydrocortisone 2.5 % lotion Apply topically 2 (two) times daily. 59 mL 0   lidocaine-prilocaine (EMLA) cream Apply 1 application topically as needed. 30 g 5   loratadine (CLARITIN) 10 MG tablet Take 1 tablet (10 mg total) by mouth daily. 30 tablet 4   OXYTROL 3.9 MG/24HR APPLY 1 PATCH TOPICALLY TO THE SKIN 2 TIMES A WEEK 8 patch 2   QUEtiapine (SEROQUEL) 100 MG tablet Take 1 tablet (100 mg total) by mouth at bedtime. 60 tablet 2   B Complex-C-E-Zn (EQL STRESS B-COMPLEX C/ZINC) TABS One a day po. With food. (Patient not taking: Reported on 01/26/2021)     Omega-3 Fatty Acids (FISH OIL) 1000 MG CAPS Take by mouth. (Patient not taking: Reported on 01/26/2021)     pantoprazole (PROTONIX) 40 MG  tablet Take 1 tablet (40 mg total) by mouth daily. 30 tablet 3   promethazine-dextromethorphan (PROMETHAZINE-DM) 6.25-15 MG/5ML syrup Take 5 mLs by mouth 4 (four) times daily as needed for cough. (Patient not taking: Reported on 01/18/2021) 118 mL 0   No facility-administered medications prior to visit.    Allergies  Allergen Reactions   Losartan Itching    ROS Review of Systems  Constitutional: Negative.   HENT: Negative.    Eyes: Negative.   Respiratory: Negative.    Gastrointestinal: Negative.   Endocrine: Negative.   Genitourinary: Negative.   Musculoskeletal:  Positive for back pain.  Skin:  Positive for rash (itching on neck).  Allergic/Immunologic: Negative.   Neurological:  Negative for dizziness, numbness and headaches. Weakness: frequent falls. Psychiatric/Behavioral:  Positive for dysphoric mood. Negative for self-injury and suicidal ideas.      Objective:    Physical Exam Vitals reviewed.  Constitutional:      Appearance: Normal appearance.  HENT:     Head: Normocephalic and atraumatic.     Mouth/Throat:     Mouth: Mucous membranes are moist.     Dentition: No dental tenderness, dental caries or gum lesions.  Cardiovascular:     Rate and Rhythm: Normal rate and regular rhythm.     Pulses: Normal pulses.     Heart sounds: Normal heart sounds.  Pulmonary:     Effort: Pulmonary effort is normal. No respiratory distress.     Breath sounds: Normal breath sounds. No wheezing.  Chest:     Chest wall: No tenderness.  Skin:    General: Skin is warm and dry.  Neurological:     Mental Status: He is alert.  Psychiatric:        Mood and Affect: Mood normal.        Behavior: Behavior normal.        Thought Content: Thought content normal.        Judgment: Judgment normal.    BP (!) 137/94    Pulse 91    Resp 16    Wt 208 lb 6.4 oz (94.5 kg)    SpO2 96%    BMI 29.07 kg/m  Wt Readings from Last 3 Encounters:  01/26/21 208 lb 6.4 oz (94.5 kg)  12/07/20 204 lb  (92.5 kg)  11/03/20 204 lb 12.8 oz (92.9 kg)     There are no preventive care reminders to display for this patient.  There are no preventive care reminders to display for this patient.  Lab Results  Component Value Date   TSH 2.170 03/24/2019   Lab Results  Component Value  Date   WBC 3.4 (L) 01/18/2021   HGB 14.3 01/18/2021   HCT 42.0 01/18/2021   MCV 86.8 01/18/2021   PLT 203 01/18/2021   Lab Results  Component Value Date   NA 138 01/18/2021   K 3.8 01/18/2021   CO2 28 01/18/2021   GLUCOSE 91 01/18/2021   BUN 15 01/18/2021   CREATININE 1.05 01/18/2021   BILITOT 0.5 06/23/2020   ALKPHOS 102 06/23/2020   AST 17 06/23/2020   ALT 15 06/23/2020   PROT 7.1 06/23/2020   ALBUMIN 4.3 06/23/2020   CALCIUM 9.3 01/18/2021   ANIONGAP 8 01/18/2021   EGFR 86 06/23/2020   Lab Results  Component Value Date   CHOL 156 06/23/2020   Lab Results  Component Value Date   HDL 50 06/23/2020   Lab Results  Component Value Date   LDLCALC 89 06/23/2020   Lab Results  Component Value Date   TRIG 92 06/23/2020   Lab Results  Component Value Date   CHOLHDL 3.1 06/23/2020   No results found for: HGBA1C    Assessment & Plan:   Problem List Items Addressed This Visit       Cardiovascular and Mediastinum   Essential hypertension - Primary    Blood pressure somewhat elevated today; no changes in medications at this time, reevaluate at next visit.        Respiratory   OSA (obstructive sleep apnea)    Continue using CPAP machine; patient is doing well with this.         Musculoskeletal and Integument   DDD (degenerative disc disease), lumbar    Continue Neurontin and Baclofen. PT completed, no further PT scheduled at this time.         Lumbar foraminal stenosis    No surgical intervention being pursued at this time. Continue with Neurontin and Baclofen for pain management.   May begin exercise regimen at new gym. Suggestions include walking on treadmill at 1.5  mph, elliptical, stationary bike. No weights heavier than 10 lbs should be used, and no military press shoulder type movements.        Other   Bipolar affective disorder, currently depressed, moderate ([place])    Continue follow-ups with mental/behavioral health.       Relevant Medications   hydrOXYzine (VISTARIL) 25 MG capsule   Frequent falls    Continue using cane. Exercise safety at gym with new exercise regimen and get instructions prior to using equipment.      Itching    Hydroxyzine prescribed to help with itching symptoms.       Other Visit Diagnoses     Severe episode of recurrent major depressive disorder, with psychotic features ([place])       Relevant Medications   hydrOXYzine (VISTARIL) 25 MG capsule       Meds ordered this encounter  Medications   hydrOXYzine (VISTARIL) 25 MG capsule    Sig: Take 1 capsule (25 mg total) by mouth every 8 (eight) hours as needed.    Dispense:  60 capsule    Refill:  1  I have seen and examined this patient with the mid-level provider and agree with the above note .   Follow-up: Return in about 3 months (around 04/26/2021).    Asencion Noble, MD

## 2021-01-27 ENCOUNTER — Encounter: Payer: Self-pay | Admitting: Critical Care Medicine

## 2021-01-30 ENCOUNTER — Encounter: Payer: Self-pay | Admitting: Critical Care Medicine

## 2021-01-30 NOTE — Progress Notes (Signed)
Letter for pt.

## 2021-02-01 ENCOUNTER — Encounter: Payer: Self-pay | Admitting: Critical Care Medicine

## 2021-02-03 ENCOUNTER — Encounter: Payer: Self-pay | Admitting: Internal Medicine

## 2021-02-06 ENCOUNTER — Encounter: Payer: Self-pay | Admitting: Critical Care Medicine

## 2021-02-07 NOTE — Telephone Encounter (Signed)
Please advise on patient's mychart message  I fall asleep off and on during the days at anytime for example I could be texting as we are texting back & forward I could fall asleep just that quick waiting for the reply or I have been put on hold with a company like [employer] then fall asleep sometimes I heard the saying Mr Lenart then I wake up and sometimes when I go to bed at night before I notice I had woke up and remember I dont have the mask on or machines I try to go bed early and still sometimes this happens i have to read at least one bible chapter or write things I need to do then I just fall asleep at anytime I dont understand this  , I have been talking with Family the next thing I fell asleep sometimes they notice and I heard them say wake up or some times I dont. Last night I was only able to sleep almost 3 hours

## 2021-02-09 ENCOUNTER — Encounter: Payer: Self-pay | Admitting: Internal Medicine

## 2021-02-09 NOTE — Assessment & Plan Note (Signed)
Benefits from CPAP.  Discussed compliance goals. Plan-order DME service for humidifier tank

## 2021-02-09 NOTE — Assessment & Plan Note (Signed)
Not interfering with CPAP use.  He does seem anxious for reassurance that he is using CPAP appropriately.

## 2021-02-10 ENCOUNTER — Encounter: Payer: Self-pay | Admitting: Critical Care Medicine

## 2021-02-15 ENCOUNTER — Encounter: Payer: Self-pay | Admitting: Critical Care Medicine

## 2021-02-16 ENCOUNTER — Other Ambulatory Visit: Payer: Self-pay

## 2021-02-16 ENCOUNTER — Ambulatory Visit (AMBULATORY_SURGERY_CENTER): Payer: Medicaid Other

## 2021-02-16 VITALS — Ht 71.0 in | Wt 205.0 lb

## 2021-02-16 DIAGNOSIS — Z1211 Encounter for screening for malignant neoplasm of colon: Secondary | ICD-10-CM

## 2021-02-16 MED ORDER — NA SULFATE-K SULFATE-MG SULF 17.5-3.13-1.6 GM/177ML PO SOLN: 1.0000 | Freq: Once | ORAL | 0 refills | Status: AC

## 2021-02-16 NOTE — Progress Notes (Signed)
No egg or soy allergy known to patient  No issues known to pt with past sedation with any surgeries or procedures Patient denies ever being told they had issues or difficulty with intubation  No FH of Malignant Hyperthermia Pt is not on diet pills Pt is not on home 02  Pt is not on blood thinners  Pt reports issues with constipation - patient reports he increases his po fluids/ advised to take OTC stool softeners/laxatives No A fib or A flutter Pt is fully vaccinated for Covid x 2 + boosters; NO PA's for preps discussed with pt in PV today  Discussed with pt there will be an out-of-pocket cost for prep and that varies from $0 to 70 + dollars - pt verbalized understanding  Due to the COVID-19 pandemic we are asking patients to follow certain guidelines in PV and the [facility]   Pt aware of COVID protocols and [facility] guidelines  PV completed over the phone. Pt verified name, DOB, address and insurance during PV today.  Pt mailed instruction packet with copy of consent form to read and not return, and instructions.  Pt encouraged to call with questions or issues.  If pt has My chart, procedure instructions sent via My Chart

## 2021-02-17 ENCOUNTER — Encounter: Payer: Self-pay | Admitting: Internal Medicine

## 2021-02-18 ENCOUNTER — Other Ambulatory Visit: Payer: Self-pay | Admitting: Critical Care Medicine

## 2021-02-18 NOTE — Telephone Encounter (Signed)
Requested Prescriptions  Pending Prescriptions Disp Refills   loratadine (CLARITIN) 10 MG tablet [Pharmacy Med Name: LORATADINE 10MG  TABLETS] 30 tablet 4    Sig: TAKE 1 TABLET(10 MG) BY MOUTH DAILY     Ear, Nose, and Throat:  Antihistamines Passed - 02/18/2021 12:23 PM      Passed - Valid encounter within last 12 months    Recent Outpatient Visits          3 weeks ago Essential hypertension   [facility] 02/20/2021, MD   2 months ago Essential hypertension   [facility] Storm Frisk, MD   4 months ago Essential hypertension   [facility] Storm Frisk, MD   6 months ago Essential hypertension   [facility] Storm Frisk, MD   8 months ago Essential hypertension   [facility] Storm Frisk, MD              OXYTROL 3.9 MG/24HR [Pharmacy Med Name: OXYTROL 3.9MG / DAY PATCH] 8 patch 2    Sig: APPLY 1 PATCH TOPICALLY TO THE SKIN 2 TIMES A WEEK     Urology:  Bladder Agents Passed - 02/18/2021 12:23 PM      Passed - Valid encounter within last 12 months    Recent Outpatient Visits          3 weeks ago Essential hypertension   [facility] 02/20/2021, MD   2 months ago Essential hypertension   [facility] Storm Frisk, MD   4 months ago Essential hypertension   [facility] Storm Frisk, MD   6 months ago Essential hypertension    [facility]   8 months ago Essential hypertension   [facility], MD

## 2021-02-19 ENCOUNTER — Encounter: Payer: Self-pay | Admitting: Critical Care Medicine

## 2021-02-20 ENCOUNTER — Encounter: Payer: Self-pay | Admitting: Critical Care Medicine

## 2021-02-26 ENCOUNTER — Encounter: Payer: Self-pay | Admitting: Critical Care Medicine

## 2021-03-02 ENCOUNTER — Encounter: Payer: Medicaid Other | Admitting: Gastroenterology

## 2021-03-05 NOTE — Progress Notes (Unsigned)
HPI M never smoker followed for OSA, complicated by  Hemorrhoids, HTN, Seizure Disorder, Meralgia Paresthetica, Bipolar, Anxiety Depression, NPSG 06/10/19- AHI 24.2/ hr, 78%, body weight today 240 lbs =======================================================   11/01/20-  60 yoM never smoker followed for OSA, complicated by  Hemorrhoids, HTN, Seizure Disorder, Meralgia Paresthetica, Bipolar, Anxiety Depression, CPAP auto 5-10/ [employer]  ordered 10/30/19  AirSense 10 AutoSet Download- compliance 67%, AHI 1.3/ hr Body weight today-204 lbs Covid vax-1 J&J, 2 Phizer Flu vax- -----Pt states new water chamber for CPAP Still gets drowsy and blames his psychiatric medications. Humidifier reservoir for his CPAP machine get sustained despite using distilled water.  Discussed washing it.  03/07/21- 60 yoM never smoker followed for OSA, complicated by  Hemorrhoids, HTN, Seizure Disorder, Meralgia Paresthetica, Bipolar, Anxiety Depression, CPAP auto 5-10/ [facility]  ordered 10/30/19  AirSense 10 AutoSet Download- compliance  Body weight today- Covid vax-1 J&J, 2 Phizer Flu vax-  ROS-see HPI   + = positive Constitutional:    weight loss, night sweats, fevers, chills, fatigue, lassitude. HEENT:   ++ headaches, difficulty swallowing,+ tooth/dental problems, sore throat,       sneezing, itching, ear ache, nasal congestion, post nasal drip, snoring CV:    chest pain, orthopnea, PND, swelling in lower extremities, anasarca,                                   dizziness, palpitations Resp:   +shortness of breath with exertion or at rest.                productive cough,   non-productive cough, coughing up of blood.              change in color of mucus.  wheezing.   Skin:    rash or lesions. GI:  No-   heartburn, indigestion, abdominal pain, nausea, vomiting, diarrhea,                 change in bowel habits, loss of appetite GU: dysuria, change in color of urine, no urgency or frequency.   flank pain. MS:    joint pain, stiffness, decreased range of motion, back pain. Neuro-     nothing unusual Psych:  change in mood or affect.  +depression or +anxiety.   memory loss.  OBJ- Physical Exam General- Alert, Oriented, Affect-appropriate, Distress- none acute, + not obese Skin- rash-none, lesions- none, excoriation- none Lymphadenopathy- none Head- atraumatic            Eyes- Gross vision intact, PERRLA, conjunctivae and secretions clear            Ears- Hearing, canals-normal            Nose- Clear, no-Septal dev, mucus, polyps, erosion, perforation             Throat- Mallampati III , mucosa clear , drainage- none, tonsils- atrophic,  + teeth Neck- flexible , trachea midline, no stridor , thyroid nl, carotid no bruit Chest - symmetrical excursion , unlabored           Heart/CV- RRR , no murmur , no gallop  , no rub, nl s1 s2                           - JVD- none , edema- none, stasis changes- none, varices- none           Lung- clear  to P&A, wheeze- none, cough- none , dullness-none, rub- none           Chest wall-  Abd-  Br/ Gen/ Rectal- Not done, not indicated Extrem- + cane Neuro- + speech slow, + sucking on tongue blade in mouth ?frontal lobe

## 2021-03-06 ENCOUNTER — Encounter: Payer: Self-pay | Admitting: Internal Medicine

## 2021-03-06 NOTE — Telephone Encounter (Signed)
Patient is scheduled for OV tomorrow at 11:30 and has been advised to keep that appt so that he can talk to Dr. Maple Hudson about everything going on. Nothing further needed at this time.

## 2021-03-06 NOTE — Telephone Encounter (Signed)
Will forward to [facility] and Dr. Annamaria Boots that the pt was having issues with his transportation service and may not be able to make tomorrows appt. Advised pt to call us to reschedule if he needs.

## 2021-03-07 ENCOUNTER — Encounter: Payer: Self-pay | Admitting: Internal Medicine

## 2021-03-07 ENCOUNTER — Ambulatory Visit: Payer: Medicaid Other | Admitting: Internal Medicine

## 2021-03-13 ENCOUNTER — Ambulatory Visit (INDEPENDENT_AMBULATORY_CARE_PROVIDER_SITE_OTHER): Payer: Medicaid Other | Admitting: Podiatry

## 2021-03-13 ENCOUNTER — Other Ambulatory Visit: Payer: Self-pay

## 2021-03-13 ENCOUNTER — Encounter: Payer: Self-pay | Admitting: Podiatry

## 2021-03-13 DIAGNOSIS — B351 Tinea unguium: Secondary | ICD-10-CM | POA: Diagnosis not present

## 2021-03-13 DIAGNOSIS — M79674 Pain in right toe(s): Secondary | ICD-10-CM | POA: Diagnosis not present

## 2021-03-13 DIAGNOSIS — M79675 Pain in left toe(s): Secondary | ICD-10-CM

## 2021-03-13 NOTE — Progress Notes (Signed)
This patient returns to my office for at risk foot care.  This patient requires this care by a professional since this patient will be at risk due to having neuropathy and he takes gabapentin.  This patient is unable to cut nails himself since the patient cannot reach his nails.These nails are painful walking and wearing shoes.  This patient presents for at risk foot care today.  General Appearance  Alert, conversant and in no acute stress.  Vascular  Dorsalis pedis and posterior tibial  pulses are palpable  bilaterally.  Capillary return is within normal limits  bilaterally. Temperature is within normal limits  bilaterally.  Neurologic  Senn-Weinstein monofilament wire test diminished/absent  bilaterally. Muscle power within normal limits bilaterally.  Nails Thick disfigured discolored nails with subungual debris  from hallux to fifth toes bilaterally. No evidence of bacterial infection or drainage bilaterally.  Orthopedic  No limitations of motion  feet .  No crepitus or effusions noted.  No bony pathology or digital deformities noted.   Hallux limitus  B/L.  Skin  normotropic skin with no porokeratosis noted bilaterally.  No signs of infections or ulcers noted.     Onychomycosis  Pain in right toes  Pain in left toes  Consent was obtained for treatment procedures.   Mechanical debridement of nails 1-5  bilaterally performed with a nail nipper.  Filed with dremel without incident.    Return office visit  4 months                    Told patient to return for periodic foot care and evaluation due to potential at risk complications.   Helane Gunther DPM

## 2021-03-14 ENCOUNTER — Ambulatory Visit: Payer: Medicaid Other | Admitting: Primary Care

## 2021-03-18 NOTE — Progress Notes (Signed)
HPI M never smoker followed for OSA, complicated by  Hemorrhoids, HTN, Seizure Disorder, Meralgia Paresthetica, Bipolar, Anxiety Depression, NPSG 06/10/19- AHI 24.2/ hr, 78%, body weight today 240 lbs =======================================================   11/01/20-  36 yoM never smoker followed for OSA, complicated by  Hemorrhoids, HTN, Seizure Disorder, Meralgia Paresthetica, Bipolar, Anxiety Depression, CPAP auto 5-10/ [facility]  ordered 10/30/19  AirSense 10 AutoSet Download- compliance 67%, AHI 1.3/ hr Body weight today-204 lbs Covid vax-1 J&J, 2 Phizer Flu vax- -----Pt states new water chamber for CPAP Still gets drowsy and blames his psychiatric medications. Humidifier reservoir for his CPAP machine gets stained despite using distilled water.  Discussed washing it.  03/20/21-60 yoM never smoker followed for OSA, complicated by  Hemorrhoids, HTN, Seizure Disorder, Meralgia Paresthetica, Bipolar, Anxiety Depression, CPAP auto 5-12/ [facility]  ordered 10/30/19  AirSense 10 AutoSet Download- compliance 97%, AHI 1.3/ hr Body weight today-213 lbs Covid vax-1 J&J, 3 Phizer Flu vax-had Complains his FFM is uncomfortable. Discussed mask fitting and he requests this.  Denies health changes.  Indicates several family deaths over past year. Grieving.  ROS-see HPI   + = positive Constitutional:    weight loss, night sweats, fevers, chills, fatigue, lassitude. HEENT:   ++ headaches, difficulty swallowing,+ tooth/dental problems, sore throat,       sneezing, itching, ear ache, nasal congestion, post nasal drip, snoring CV:    chest pain, orthopnea, PND, swelling in lower extremities, anasarca,                                   dizziness, palpitations Resp:   +shortness of breath with exertion or at rest.                productive cough,   non-productive cough, coughing up of blood.              change in color of mucus.  wheezing.   Skin:    rash or lesions. GI:  No-   heartburn, indigestion,  abdominal pain, nausea, vomiting, diarrhea,                 change in bowel habits, loss of appetite GU: dysuria, change in color of urine, no urgency or frequency.   flank pain. MS:   joint pain, stiffness, decreased range of motion, back pain. Neuro-     nothing unusual Psych:  change in mood or affect.  +depression or +anxiety.   memory loss.  OBJ- Physical Exam General- Alert, Oriented, Affect-labile- cried briefly referring to deaths in family, Distress- none acute, + not obese Skin- rash-none, lesions- none, excoriation- none Lymphadenopathy- none Head- atraumatic            Eyes- Gross vision intact, PERRLA, conjunctivae and secretions clear            Ears- Hearing, canals-normal            Nose- Clear, no-Septal dev, mucus, polyps, erosion, perforation             Throat- Mallampati III , mucosa clear , drainage- none, tonsils- atrophic,  + teeth Neck- flexible , trachea midline, no stridor , thyroid nl, carotid no bruit Chest - symmetrical excursion , unlabored           Heart/CV- RRR , no murmur , no gallop  , no rub, nl s1 s2                           -  JVD- none , edema- none, stasis changes- none, varices- none           Lung- clear to P&A, wheeze- none, cough- none , dullness-none, rub- none           Chest wall-  Abd-  Br/ Gen/ Rectal- Not done, not indicated Extrem- + cane Neuro- + speech slow, + sucking on tongue blade in mouth ?frontal lobe

## 2021-03-20 ENCOUNTER — Other Ambulatory Visit: Payer: Self-pay

## 2021-03-20 ENCOUNTER — Encounter: Payer: Self-pay | Admitting: Internal Medicine

## 2021-03-20 ENCOUNTER — Ambulatory Visit (INDEPENDENT_AMBULATORY_CARE_PROVIDER_SITE_OTHER): Payer: Medicaid Other | Admitting: Internal Medicine

## 2021-03-20 VITALS — BP 132/90 | HR 81 | Temp 98.2°F | Ht 71.0 in | Wt 213.2 lb

## 2021-03-20 DIAGNOSIS — G4733 Obstructive sleep apnea (adult) (pediatric): Secondary | ICD-10-CM

## 2021-03-20 DIAGNOSIS — Z9989 Dependence on other enabling machines and devices: Secondary | ICD-10-CM | POA: Diagnosis not present

## 2021-03-20 DIAGNOSIS — F3132 Bipolar disorder, current episode depressed, moderate: Secondary | ICD-10-CM

## 2021-03-20 NOTE — Patient Instructions (Signed)
Order- schedule mask fitting desensitization at sleep center  We can continue CPAP auto 5-12, mask of choice,  supplies,  Humidifier, Airview/ card  Please call if we can help

## 2021-03-21 ENCOUNTER — Encounter: Payer: Self-pay | Admitting: Internal Medicine

## 2021-03-21 NOTE — Assessment & Plan Note (Signed)
Labile affect at this visit. He will continue to follow appropriate provider for help.

## 2021-03-21 NOTE — Assessment & Plan Note (Signed)
Benefits from CPAP with good compliance and control. Uncomfortable mask. He is maxing out current pressure range but control is good, so won't change now.  Plan- refer for mask fitting.

## 2021-03-28 ENCOUNTER — Encounter (HOSPITAL_BASED_OUTPATIENT_CLINIC_OR_DEPARTMENT_OTHER): Payer: Self-pay | Admitting: Internal Medicine

## 2021-03-28 ENCOUNTER — Encounter: Payer: Self-pay | Admitting: Critical Care Medicine

## 2021-03-29 ENCOUNTER — Encounter (HOSPITAL_BASED_OUTPATIENT_CLINIC_OR_DEPARTMENT_OTHER): Payer: Self-pay | Admitting: Internal Medicine

## 2021-03-29 ENCOUNTER — Other Ambulatory Visit: Payer: Self-pay | Admitting: Critical Care Medicine

## 2021-03-29 MED ORDER — BACLOFEN 10 MG PO TABS: ORAL_TABLET | ORAL | 6 refills | Status: DC

## 2021-03-29 MED ORDER — GABAPENTIN 300 MG PO CAPS: ORAL_CAPSULE | ORAL | 2 refills | Status: DC

## 2021-03-31 ENCOUNTER — Encounter (HOSPITAL_BASED_OUTPATIENT_CLINIC_OR_DEPARTMENT_OTHER): Payer: Self-pay | Admitting: Internal Medicine

## 2021-04-02 ENCOUNTER — Encounter (HOSPITAL_BASED_OUTPATIENT_CLINIC_OR_DEPARTMENT_OTHER): Payer: Self-pay | Admitting: Internal Medicine

## 2021-04-03 ENCOUNTER — Encounter: Payer: Self-pay | Admitting: Critical Care Medicine

## 2021-04-03 ENCOUNTER — Encounter: Payer: Self-pay | Admitting: Orthopaedic Surgery

## 2021-04-03 ENCOUNTER — Encounter (HOSPITAL_BASED_OUTPATIENT_CLINIC_OR_DEPARTMENT_OTHER): Payer: Self-pay | Admitting: Internal Medicine

## 2021-04-03 ENCOUNTER — Encounter: Payer: Self-pay | Admitting: Podiatry

## 2021-04-03 ENCOUNTER — Encounter: Payer: Self-pay | Admitting: Physical Therapy

## 2021-04-03 ENCOUNTER — Other Ambulatory Visit: Payer: Self-pay | Admitting: Critical Care Medicine

## 2021-04-04 NOTE — Telephone Encounter (Signed)
Requested Prescriptions  ?Pending Prescriptions Disp Refills  ?? amLODipine (NORVASC) 5 MG tablet [Pharmacy Med Name: AMLODIPINE BESYLATE 5MG  TABLETS] 60 tablet 1  ?  Sig: TAKE 1 TABLET(5 MG) BY MOUTH DAILY  ?  ? Cardiovascular: Calcium Channel Blockers 2 Failed - 04/03/2021 11:43 AM  ?  ?  Failed - Last BP in normal range  ?  BP Readings from Last 1 Encounters:  ?03/20/21 132/90  ?   ?  ?  Passed - Last Heart Rate in normal range  ?  Pulse Readings from Last 1 Encounters:  ?03/20/21 81  ?   ?  ?  Passed - Valid encounter within last 6 months  ?  Recent Outpatient Visits   ?      ? 2 months ago Essential hypertension  ? [place] [name], MD  ? 3 months ago Essential hypertension  ? [place] [name], MD  ? 6 months ago Essential hypertension  ? [place] [name], [place]  ? 7 months ago Essential hypertension  ? [place] [name], MD  ? 9 months ago Essential hypertension  ? [place] [name], MD  ?  ?  ?Future Appointments   ?        ? In 2 months [name], MD [place]  ? In 11 months Deneise Lever, MD [facility]  ?  ? ?  ?  ?  ? ?

## 2021-04-04 NOTE — Telephone Encounter (Signed)
01/26/21 OV , PCP recommended f/u appt 04/26/21. Patient needs scheduled visit.  ?

## 2021-04-11 ENCOUNTER — Encounter: Payer: Self-pay | Admitting: Critical Care Medicine

## 2021-04-12 ENCOUNTER — Encounter: Payer: Self-pay | Admitting: Critical Care Medicine

## 2021-04-12 ENCOUNTER — Encounter (HOSPITAL_BASED_OUTPATIENT_CLINIC_OR_DEPARTMENT_OTHER): Payer: Self-pay | Admitting: Internal Medicine

## 2021-04-18 ENCOUNTER — Encounter (HOSPITAL_BASED_OUTPATIENT_CLINIC_OR_DEPARTMENT_OTHER): Payer: Self-pay | Admitting: Internal Medicine

## 2021-04-22 ENCOUNTER — Encounter (HOSPITAL_BASED_OUTPATIENT_CLINIC_OR_DEPARTMENT_OTHER): Payer: Self-pay | Admitting: Internal Medicine

## 2021-04-24 ENCOUNTER — Other Ambulatory Visit (HOSPITAL_BASED_OUTPATIENT_CLINIC_OR_DEPARTMENT_OTHER): Payer: Medicaid Other | Admitting: Internal Medicine

## 2021-04-26 ENCOUNTER — Encounter: Payer: Self-pay | Admitting: Critical Care Medicine

## 2021-04-27 ENCOUNTER — Encounter (HOSPITAL_BASED_OUTPATIENT_CLINIC_OR_DEPARTMENT_OTHER): Payer: Self-pay | Admitting: Internal Medicine

## 2021-05-01 ENCOUNTER — Encounter: Payer: Self-pay | Admitting: Critical Care Medicine

## 2021-05-03 ENCOUNTER — Encounter (HOSPITAL_BASED_OUTPATIENT_CLINIC_OR_DEPARTMENT_OTHER): Payer: Self-pay | Admitting: Internal Medicine

## 2021-05-03 ENCOUNTER — Encounter: Payer: Self-pay | Admitting: Critical Care Medicine

## 2021-05-09 ENCOUNTER — Encounter (HOSPITAL_BASED_OUTPATIENT_CLINIC_OR_DEPARTMENT_OTHER): Payer: Self-pay | Admitting: Internal Medicine

## 2021-05-11 ENCOUNTER — Encounter: Payer: Self-pay | Admitting: Critical Care Medicine

## 2021-05-14 ENCOUNTER — Encounter (HOSPITAL_BASED_OUTPATIENT_CLINIC_OR_DEPARTMENT_OTHER): Payer: Self-pay | Admitting: Internal Medicine

## 2021-05-16 ENCOUNTER — Encounter: Payer: Self-pay | Admitting: Critical Care Medicine

## 2021-05-17 ENCOUNTER — Encounter (HOSPITAL_BASED_OUTPATIENT_CLINIC_OR_DEPARTMENT_OTHER): Payer: Self-pay | Admitting: Internal Medicine

## 2021-05-17 ENCOUNTER — Other Ambulatory Visit: Payer: Self-pay | Admitting: Critical Care Medicine

## 2021-05-17 MED ORDER — AZELASTINE HCL 0.1 % NA SOLN: 2.0000 | Freq: Two times a day (BID) | NASAL | 12 refills | Status: DC

## 2021-05-21 ENCOUNTER — Encounter (HOSPITAL_BASED_OUTPATIENT_CLINIC_OR_DEPARTMENT_OTHER): Payer: Self-pay | Admitting: Internal Medicine

## 2021-05-22 ENCOUNTER — Encounter: Payer: Self-pay | Admitting: Critical Care Medicine

## 2021-05-22 ENCOUNTER — Other Ambulatory Visit: Payer: Self-pay | Admitting: Critical Care Medicine

## 2021-05-22 NOTE — Telephone Encounter (Signed)
Will forward to provider  

## 2021-05-23 ENCOUNTER — Encounter: Payer: Self-pay | Admitting: Critical Care Medicine

## 2021-05-25 ENCOUNTER — Encounter (HOSPITAL_BASED_OUTPATIENT_CLINIC_OR_DEPARTMENT_OTHER): Payer: Self-pay | Admitting: Internal Medicine

## 2021-05-30 ENCOUNTER — Ambulatory Visit (HOSPITAL_BASED_OUTPATIENT_CLINIC_OR_DEPARTMENT_OTHER): Payer: Medicaid Other | Attending: Internal Medicine | Admitting: Internal Medicine

## 2021-05-30 DIAGNOSIS — G4733 Obstructive sleep apnea (adult) (pediatric): Secondary | ICD-10-CM

## 2021-06-12 NOTE — Progress Notes (Incomplete)
Established Patient Office Visit  Subjective:  Patient ID: Jeremiah Hunter, male    DOB: [birth year]  Age: 61 y.o. MRN: [id number]  CC:  No chief complaint on file.   HPI Jeremiah Hunter is a 60 year old man who presents for primary care follow up today. His only acute complaint at this visit is some bodily itching, particularly on the right side of his neck and shoulder.   Jeremiah Hunter has a history of low back pain and degenerative disc disease. He received an MRI for this and has completed physical therapy as recommended by orthopedics. He has joined a gym recently and is interested and motivated to begin exercising more frequently.   His hypertension is currently being managed with Amlodipine and he takes this daily. He denies taking home blood pressures at this time. His sleep apnea also remains well managed with the use of his CPAP machine at home.  Patient did mention that his mood has been low lately, as he has had multiple family members and friends pass away recently. He also has some worries surrounding his safety at his apartment.   Past Medical History:  Diagnosis Date  . Acute bilateral low back pain without sciatica 07/30/2019  . Allergy    on meds  . Anxiety    on meds  . DDD (degenerative disc disease), lumbar    on meds  . Enlarged prostate   . GERD (gastroesophageal reflux disease)    on meds  . Hypertension    on meds  . Severe episode of recurrent major depressive disorder, with psychotic features ([place]) 03/24/2019   on meds  . Sleep apnea    uses CPAP    Past Surgical History:  Procedure Laterality Date  . CYSTECTOMY    . DENTAL SURGERY     teeth pulled  . HERNIA REPAIR      Family History  Family history unknown: Yes    Social History   Socioeconomic History  . Marital status: Single    Spouse name: Not on file  . Number of children: Not on file  . Years of education: Not on file  . Highest education level: Not on file  Occupational History   . Not on file  Tobacco Use  . Smoking status: Never  . Smokeless tobacco: Never  Vaping Use  . Vaping Use: Never used  Substance and Sexual Activity  . Alcohol use: Never  . Drug use: Never  . Sexual activity: Not Currently  Other Topics Concern  . Not on file  Social History Narrative  . Not on file   Social Determinants of Health   Financial Resource Strain: Not on file  Food Insecurity: Not on file  Transportation Needs: Not on file  Physical Activity: Not on file  Stress: Not on file  Social Connections: Not on file  Intimate Partner Violence: Not on file    Outpatient Medications Prior to Visit  Medication Sig Dispense Refill  . amLODipine (NORVASC) 5 MG tablet TAKE 1 TABLET(5 MG) BY MOUTH DAILY 60 tablet 1  . azelastine (ASTELIN) 0.1 % nasal spray Place 2 sprays into both nostrils 2 (two) times daily. Use in each nostril as directed 30 mL 12  . B Complex-C-E-Zn (EQL STRESS B-COMPLEX C/ZINC) TABS One a day po. With food.    . baclofen (LIORESAL) 10 MG tablet TAKE 1/2 TO 1 TABLET(5 TO 10 MG) BY MOUTH THREE TIMES DAILY AS NEEDED FOR MUSCLE SPASMS 30 tablet 6  .  cholecalciferol (VITAMIN D3) 25 MCG (1000 UNIT) tablet Take 2,000 Units by mouth daily.    . Cyanocobalamin (VITAMIN B-12 PO) Take 1 tablet by mouth daily at 6 (six) AM.    . divalproex (DEPAKOTE ER) 500 MG 24 hr tablet Take 1 tablet (500 mg total) by mouth in the morning and at bedtime. 60 tablet 4  . FLUoxetine (PROZAC) 20 MG capsule Take 1 capsule (20 mg total) by mouth daily. 60 capsule 2  . gabapentin (NEURONTIN) 300 MG capsule TAKE 2 CAPSULES(600 MG) BY MOUTH THREE TIMES DAILY 180 capsule 2  . hydrOXYzine (VISTARIL) 25 MG capsule Take 1 capsule (25 mg total) by mouth every 8 (eight) hours as needed. 60 capsule 1  . lidocaine-prilocaine (EMLA) cream Apply 1 application topically as needed. 30 g 5  . loratadine (CLARITIN) 10 MG tablet TAKE 1 TABLET(10 MG) BY MOUTH DAILY 30 tablet 4  . OXYTROL 3.9 MG/24HR APPLY  1 PATCH TOPICALLY TO THE SKIN 2 TIMES A WEEK 8 patch 2  . QUEtiapine (SEROQUEL) 100 MG tablet Take 1 tablet (100 mg total) by mouth at bedtime. 60 tablet 2   No facility-administered medications prior to visit.    Allergies  Allergen Reactions  . Losartan Itching    ROS Review of Systems  Constitutional: Negative.   HENT: Negative.    Eyes: Negative.   Respiratory: Negative.    Gastrointestinal: Negative.   Endocrine: Negative.   Genitourinary: Negative.   Musculoskeletal:  Positive for back pain.  Skin:  Positive for rash (itching on neck).  Allergic/Immunologic: Negative.   Neurological:  Negative for dizziness, numbness and headaches. Weakness: frequent falls. Psychiatric/Behavioral:  Positive for dysphoric mood. Negative for self-injury and suicidal ideas.      Objective:    Physical Exam Vitals reviewed.  Constitutional:      Appearance: Normal appearance.  HENT:     Head: Normocephalic and atraumatic.     Mouth/Throat:     Mouth: Mucous membranes are moist.     Dentition: No dental tenderness, dental caries or gum lesions.  Cardiovascular:     Rate and Rhythm: Normal rate and regular rhythm.     Pulses: Normal pulses.     Heart sounds: Normal heart sounds.  Pulmonary:     Effort: Pulmonary effort is normal. No respiratory distress.     Breath sounds: Normal breath sounds. No wheezing.  Chest:     Chest wall: No tenderness.  Skin:    General: Skin is warm and dry.  Neurological:     Mental Status: He is alert.  Psychiatric:        Mood and Affect: Mood normal.        Behavior: Behavior normal.        Thought Content: Thought content normal.        Judgment: Judgment normal.    There were no vitals taken for this visit. Wt Readings from Last 3 Encounters:  05/30/21 213 lb (96.6 kg)  03/20/21 213 lb 3.2 oz (96.7 kg)  02/16/21 205 lb (93 kg)     There are no preventive care reminders to display for this patient.  There are no preventive care  reminders to display for this patient.  Lab Results  Component Value Date   TSH 2.170 03/24/2019   Lab Results  Component Value Date   WBC 3.4 (L) 01/18/2021   HGB 14.3 01/18/2021   HCT 42.0 01/18/2021   MCV 86.8 01/18/2021   PLT 203 01/18/2021  Lab Results  Component Value Date   NA 138 01/18/2021   K 3.8 01/18/2021   CO2 28 01/18/2021   GLUCOSE 91 01/18/2021   BUN 15 01/18/2021   CREATININE 1.05 01/18/2021   BILITOT 0.5 06/23/2020   ALKPHOS 102 06/23/2020   AST 17 06/23/2020   ALT 15 06/23/2020   PROT 7.1 06/23/2020   ALBUMIN 4.3 06/23/2020   CALCIUM 9.3 01/18/2021   ANIONGAP 8 01/18/2021   EGFR 86 06/23/2020   Lab Results  Component Value Date   CHOL 156 06/23/2020   Lab Results  Component Value Date   HDL 50 06/23/2020   Lab Results  Component Value Date   LDLCALC 89 06/23/2020   Lab Results  Component Value Date   TRIG 92 06/23/2020   Lab Results  Component Value Date   CHOLHDL 3.1 06/23/2020   No results found for: HGBA1C    Assessment & Plan:   Problem List Items Addressed This Visit   None  No orders of the defined types were placed in this encounter. I have seen and examined this patient with the mid-level provider and agree with the above note .   Follow-up: No follow-ups on file.    Asencion Noble, MD

## 2021-06-13 ENCOUNTER — Encounter: Payer: Self-pay | Admitting: Critical Care Medicine

## 2021-06-13 ENCOUNTER — Ambulatory Visit: Payer: Medicaid Other | Attending: Critical Care Medicine | Admitting: Critical Care Medicine

## 2021-06-13 VITALS — BP 132/84 | HR 82 | Wt 209.2 lb

## 2021-06-13 DIAGNOSIS — M51369 Other intervertebral disc degeneration, lumbar region without mention of lumbar back pain or lower extremity pain: Secondary | ICD-10-CM

## 2021-06-13 DIAGNOSIS — G40909 Epilepsy, unspecified, not intractable, without status epilepticus: Secondary | ICD-10-CM

## 2021-06-13 DIAGNOSIS — N3281 Overactive bladder: Secondary | ICD-10-CM

## 2021-06-13 DIAGNOSIS — I739 Peripheral vascular disease, unspecified: Secondary | ICD-10-CM

## 2021-06-13 DIAGNOSIS — Z79899 Other long term (current) drug therapy: Secondary | ICD-10-CM | POA: Insufficient documentation

## 2021-06-13 DIAGNOSIS — I10 Essential (primary) hypertension: Secondary | ICD-10-CM

## 2021-06-13 DIAGNOSIS — M5136 Other intervertebral disc degeneration, lumbar region: Secondary | ICD-10-CM | POA: Diagnosis not present

## 2021-06-13 DIAGNOSIS — Z9989 Dependence on other enabling machines and devices: Secondary | ICD-10-CM | POA: Insufficient documentation

## 2021-06-13 DIAGNOSIS — L299 Pruritus, unspecified: Secondary | ICD-10-CM

## 2021-06-13 DIAGNOSIS — R296 Repeated falls: Secondary | ICD-10-CM

## 2021-06-13 DIAGNOSIS — G4733 Obstructive sleep apnea (adult) (pediatric): Secondary | ICD-10-CM

## 2021-06-13 DIAGNOSIS — Z13220 Encounter for screening for lipoid disorders: Secondary | ICD-10-CM

## 2021-06-13 DIAGNOSIS — G5712 Meralgia paresthetica, left lower limb: Secondary | ICD-10-CM | POA: Diagnosis not present

## 2021-06-13 DIAGNOSIS — E559 Vitamin D deficiency, unspecified: Secondary | ICD-10-CM

## 2021-06-13 DIAGNOSIS — Z9181 History of falling: Secondary | ICD-10-CM | POA: Insufficient documentation

## 2021-06-13 LAB — POCT ABI - SCREENING FOR PILOT NO CHARGE
Left ABI: 1.47
Right ABI: 1.45

## 2021-06-13 MED ORDER — FLUOXETINE HCL 20 MG PO CAPS: 20.0000 mg | ORAL_CAPSULE | Freq: Every day | ORAL | 2 refills | Status: DC

## 2021-06-13 MED ORDER — AMLODIPINE BESYLATE 5 MG PO TABS: ORAL_TABLET | ORAL | 4 refills | Status: DC

## 2021-06-13 MED ORDER — BACLOFEN 10 MG PO TABS: ORAL_TABLET | ORAL | 6 refills | Status: DC

## 2021-06-13 MED ORDER — HYDROXYZINE PAMOATE 25 MG PO CAPS: 25.0000 mg | ORAL_CAPSULE | Freq: Three times a day (TID) | ORAL | 1 refills | Status: DC | PRN

## 2021-06-13 MED ORDER — OXYTROL 3.9 MG/24HR TD PTTW: MEDICATED_PATCH | TRANSDERMAL | 2 refills | Status: DC

## 2021-06-13 MED ORDER — QUETIAPINE FUMARATE 100 MG PO TABS: 100.0000 mg | ORAL_TABLET | Freq: Every day | ORAL | 2 refills | Status: DC

## 2021-06-13 MED ORDER — DIVALPROEX SODIUM ER 500 MG PO TB24: 500.0000 mg | ORAL_TABLET | Freq: Two times a day (BID) | ORAL | 4 refills | Status: DC

## 2021-06-13 NOTE — Assessment & Plan Note (Signed)
Blood pressure well controlled at this time no changes made refills given

## 2021-06-13 NOTE — Patient Instructions (Addendum)
Labs today include vitamin D level and cholesterol  Blood flow was normal  No change in medications  Return to see Dr. Delford Field 4 months

## 2021-06-13 NOTE — Assessment & Plan Note (Signed)
Improved with Emla cream

## 2021-06-13 NOTE — Assessment & Plan Note (Signed)
No recurrence seizure medicines refilled

## 2021-06-13 NOTE — Assessment & Plan Note (Signed)
Continue baclofen as needed 

## 2021-06-13 NOTE — Assessment & Plan Note (Signed)
ABI screen in the office normal suspect cramping therefore vascular surgery not needed

## 2021-06-13 NOTE — Assessment & Plan Note (Signed)
Patient compliant with CPAP therapy

## 2021-06-13 NOTE — Assessment & Plan Note (Signed)
Continue to use hydroxyzine as needed

## 2021-06-13 NOTE — Assessment & Plan Note (Signed)
Improved

## 2021-06-13 NOTE — Assessment & Plan Note (Signed)
Continue Oxytrol patches

## 2021-06-13 NOTE — Assessment & Plan Note (Signed)
Taking oral over-the-counter supplement we will reassess vitamin D levels

## 2021-06-14 ENCOUNTER — Other Ambulatory Visit: Payer: Self-pay | Admitting: Critical Care Medicine

## 2021-06-14 DIAGNOSIS — E559 Vitamin D deficiency, unspecified: Secondary | ICD-10-CM

## 2021-06-14 LAB — VITAMIN D 25 HYDROXY (VIT D DEFICIENCY, FRACTURES): Vit D, 25-Hydroxy: 26.7 ng/mL — ABNORMAL LOW (ref 30.0–100.0)

## 2021-06-14 LAB — LIPID PANEL
Chol/HDL Ratio: 2.5 ratio (ref 0.0–5.0)
Cholesterol, Total: 145 mg/dL (ref 100–199)
HDL: 59 mg/dL (ref >39)
LDL Chol Calc (NIH): 66 mg/dL (ref 0–99)
Triglycerides: 110 mg/dL (ref 0–149)
VLDL Cholesterol Cal: 20 mg/dL (ref 5–40)

## 2021-06-14 MED ORDER — VITAMIN D (ERGOCALCIFEROL) 1.25 MG (50000 UNIT) PO CAPS: 50000.0000 [IU] | ORAL_CAPSULE | ORAL | 5 refills | Status: DC

## 2021-06-20 ENCOUNTER — Encounter (HOSPITAL_BASED_OUTPATIENT_CLINIC_OR_DEPARTMENT_OTHER): Payer: Medicaid Other | Admitting: Critical Care Medicine

## 2021-06-20 DIAGNOSIS — K120 Recurrent oral aphthae: Secondary | ICD-10-CM | POA: Diagnosis not present

## 2021-06-21 DIAGNOSIS — K120 Recurrent oral aphthae: Secondary | ICD-10-CM | POA: Insufficient documentation

## 2021-06-21 MED ORDER — CHLORHEXIDINE GLUCONATE 0.12 % MT SOLN
15.0000 mL | Freq: Two times a day (BID) | OROMUCOSAL | 0 refills | Status: DC
Start: 2021-06-21 — End: 2021-12-08

## 2021-06-21 NOTE — Telephone Encounter (Signed)

## 2021-06-23 ENCOUNTER — Telehealth: Payer: Self-pay | Admitting: Critical Care Medicine

## 2021-06-23 DIAGNOSIS — H539 Unspecified visual disturbance: Secondary | ICD-10-CM

## 2021-06-23 NOTE — Telephone Encounter (Signed)
Pt is calling to ask Dr. Delford Field on getting a balance ball anti slip ball for his back or getting and lumbar memory foam pillow when traveling in a car or sitting in a chair.  Please advise [phone]

## 2021-06-23 NOTE — Telephone Encounter (Signed)
Medicaid will not pay for those items. Suggest shopping [facility] for low cost

## 2021-06-26 NOTE — Telephone Encounter (Signed)
Called and pt is aware

## 2021-06-26 NOTE — Telephone Encounter (Signed)
Eye ref sent  Lumbar foam is better

## 2021-06-26 NOTE — Telephone Encounter (Signed)
Called pt to let him know of note, pt did let me know that he was asking which would be better for him and also wanted a referral for the eye doctor

## 2021-06-28 ENCOUNTER — Encounter (HOSPITAL_BASED_OUTPATIENT_CLINIC_OR_DEPARTMENT_OTHER): Payer: Medicaid Other | Admitting: Critical Care Medicine

## 2021-06-28 DIAGNOSIS — R252 Cramp and spasm: Secondary | ICD-10-CM | POA: Diagnosis not present

## 2021-06-29 DIAGNOSIS — R252 Cramp and spasm: Secondary | ICD-10-CM | POA: Insufficient documentation

## 2021-06-29 NOTE — Telephone Encounter (Signed)
Please see the MyChart message reply(ies) for my assessment and plan.  ?  ?This patient gave consent for this Medical Advice Message and is aware that it may result in a bill to their insurance company, as well as the possibility of receiving a bill for a co-payment or deductible. They are an established patient, but are not seeking medical advice exclusively about a problem treated during an in person or video visit in the last seven days. I did not recommend an in person or video visit within seven days of my reply.  ?  ?I spent a total of 8 minutes cumulative time within 7 days through MyChart messaging. ? ?Luis Sami, MD   ?

## 2021-07-05 ENCOUNTER — Telehealth: Payer: Self-pay | Admitting: Critical Care Medicine

## 2021-07-05 NOTE — Telephone Encounter (Signed)
Copied from CRM [id number]. Topic: General - Other >> Jul 05, 2021  8:02 AM Faith T wrote: Reason for CRM: Pt called in about having some questions about a leg wedge pillow, and wanted to speak with someone, please advise.

## 2021-07-06 ENCOUNTER — Other Ambulatory Visit: Payer: Self-pay

## 2021-07-06 DIAGNOSIS — R252 Cramp and spasm: Secondary | ICD-10-CM

## 2021-07-07 ENCOUNTER — Telehealth: Payer: Self-pay | Admitting: Critical Care Medicine

## 2021-07-07 ENCOUNTER — Ambulatory Visit: Payer: Medicaid Other | Attending: Critical Care Medicine

## 2021-07-07 NOTE — Telephone Encounter (Signed)
Yes buy a wedge ,  No , stay on cpap

## 2021-07-07 NOTE — Telephone Encounter (Signed)
Called pt earlier and spoke with message was already sent to pcp about this.

## 2021-07-07 NOTE — Telephone Encounter (Signed)
Called pt and and he is aware of note, he said he will try with the cpap

## 2021-07-07 NOTE — Telephone Encounter (Signed)
Pt wants to inform pcp that he is having difficulty with the cpap machine and no longer wants it, please contact pt to further assist.  Thank you

## 2021-07-07 NOTE — Telephone Encounter (Signed)
Called pt, he wants to know your thoughts on the wedge. He also stated that he will pay for it himself   For his CPAP machine he wants to get off of it, stated that he has been having some bad dream about the machine. He talked with mental health they said for him to stop using and talk with you first.

## 2021-07-08 LAB — MAGNESIUM: Magnesium: 2.2 mg/dL (ref 1.6–2.3)

## 2021-07-08 LAB — BMP8+EGFR
BUN/Creatinine Ratio: 12 (ref 9–20)
BUN: 13 mg/dL (ref 6–24)
CO2: 24 mmol/L (ref 20–29)
Calcium: 9.1 mg/dL (ref 8.7–10.2)
Chloride: 102 mmol/L (ref 96–106)
Creatinine, Ser: 1.13 mg/dL (ref 0.76–1.27)
Glucose: 79 mg/dL (ref 70–99)
Potassium: 3.7 mmol/L (ref 3.5–5.2)
Sodium: 139 mmol/L (ref 134–144)
eGFR: 75 mL/min/{1.73_m2} (ref >59)

## 2021-07-11 ENCOUNTER — Ambulatory Visit: Payer: Medicaid Other | Admitting: Podiatry

## 2021-07-12 ENCOUNTER — Telehealth: Payer: Self-pay | Admitting: Internal Medicine

## 2021-07-12 NOTE — Telephone Encounter (Signed)
Called and spoke with pt letting him know the info per CY and he verbalized understanding. Nothing further needed. 

## 2021-07-12 NOTE — Telephone Encounter (Signed)
Called and spoke with pt who states that he no longer wants to use the CPAP.  Patient has been having nightmares thinking that his CPAP machine has been trying to hurt him. Pt said that he is afraid of the machine and states that he feels like it is trying to kill him and suffocate him and harm his body.  While speaking with pt, pt did not seem right on the phone while talking to  him. Pt said that he tried to call Behavior[facility] Health about what has been happening and he was told to call us due to this being a CPAP issue.  I stated to pt that I would send this to CY about his CPAP but stated to pt that he should try to call Behavioral Health back about the nightmares that he has been having to see if he might be able to get an appt scheduled with a therapist for advice and he verbalized understanding.   Dr. Maple Hudson, please advise what you recommend in regards to pt's CPAP as patient said that he no longer wants it due to what has been happening recently.

## 2021-07-12 NOTE — Telephone Encounter (Signed)
Pt states he is not wanting to use CPAP machine anylonger. Has been getting nightmares and he is afriad of the mahcine. States it is a monster that is trying to kill him. Routing as urgent as pt seemed off during the call and wasn't making complete sentences. Please advise.

## 2021-07-12 NOTE — Telephone Encounter (Signed)
I agree with you Irving Burton. Suggest he stop using CPAP for now. Continue to work closely with [employer]. If he feels better later on he can try CPAP again. When I see him again, if this is still an issue, he and I can look at alternatives to CPAP.

## 2021-07-14 ENCOUNTER — Telehealth: Payer: Self-pay | Admitting: Critical Care Medicine

## 2021-07-14 NOTE — Telephone Encounter (Signed)
He states that he is still having cramps, wanting to know what is wrong

## 2021-07-17 ENCOUNTER — Ambulatory Visit: Payer: Medicaid Other | Admitting: Physician Assistant

## 2021-07-17 ENCOUNTER — Encounter: Payer: Self-pay | Admitting: Physician Assistant

## 2021-07-17 VITALS — BP 117/76 | HR 85 | Resp 18 | Ht 71.0 in | Wt 202.0 lb

## 2021-07-17 DIAGNOSIS — R252 Cramp and spasm: Secondary | ICD-10-CM | POA: Diagnosis not present

## 2021-07-17 NOTE — Telephone Encounter (Signed)
Will need OV  suggest [facility] medicine unit this week as I am fully booked out

## 2021-07-21 ENCOUNTER — Ambulatory Visit: Payer: Self-pay

## 2021-07-21 DIAGNOSIS — M79671 Pain in right foot: Secondary | ICD-10-CM

## 2021-07-21 NOTE — Telephone Encounter (Signed)
Pt called to report that he is experiencing numbness in his foot and significant pain. He disconnected while I had him on hold waiting for triage.    Chief Complaint: Continued right foot pain. Asking if anything else can be done for this. Symptoms: Pain, numbness "has gone." Frequency: "A long time." Pertinent Negatives: Patient denies  Disposition: [] ED /[] Urgent Care (no appt availability in office) / [] Appointment(In office/virtual)/ []  [facility] [facility]/ [] Home Care/ [] Refused Recommended Disposition /[] [facility]/ [x]  Follow-up with PCP Additional Notes: Please advise pt.   Answer Assessment - Initial Assessment Questions 1. ONSET: "When did the pain start?"      For awhile 2. LOCATION: "Where is the pain located?"      Right foot 3. PAIN: "How bad is the pain?"    (Scale 1-10; or mild, moderate, severe)  - MILD (1-3): doesn't interfere with normal activities.   - MODERATE (4-7): interferes with normal activities (e.g., work or school) or awakens from sleep, limping.   - SEVERE (8-10): excruciating pain, unable to do any normal activities, unable to walk.      Severe 4. WORK OR EXERCISE: "Has there been any recent work or exercise that involved this part of the body?"      No 5. CAUSE: "What do you think is causing the foot pain?"     Unsure 6. OTHER SYMPTOMS: "Do you have any other symptoms?" (e.g., leg pain, rash, fever, numbness)     No 7. PREGNANCY: "Is there any chance you are pregnant?" "When was your last menstrual period?"     N/a  Protocols used: Foot Pain-A-AH

## 2021-07-23 NOTE — Addendum Note (Signed)
Addended by: Storm Frisk on: 07/23/2021 09:49 AM   Modules accepted: Orders

## 2021-07-23 NOTE — Telephone Encounter (Signed)
Referral to podiatry made

## 2021-07-24 ENCOUNTER — Ambulatory Visit: Payer: Self-pay

## 2021-07-24 NOTE — Telephone Encounter (Signed)
Pt stated he received a message from his pharmacy stating that one of his medications have been taken off the market due to it being dangerous to take. Pt reports that he just stopped taking all his medications except the behavioral health meds because he was unsure of which medication the message was referencing as no med name was given. Cb# [phone]  Called pt's pharmacy and no reported recalls on his medication. Spoke with [employer] (pharmacist) before calling pt. She stated they have no record of a call regarding any medication being recalled that the pt is taking. Called pt and pt stopped taking his med Friday. Pt does not recall which med. Looked up recalled med's on FDA web site. Did not seeing any matching medications.  Routing to office for review. Pt stated wont start medication back until he hears back form Dr Delford Field.

## 2021-07-24 NOTE — Telephone Encounter (Signed)
Fyi.

## 2021-07-24 NOTE — Telephone Encounter (Signed)
Called and left vm  °

## 2021-07-24 NOTE — Telephone Encounter (Signed)
Noted  

## 2021-07-24 NOTE — Progress Notes (Signed)
I spoke to the patient none of the medications he is on have been removed from the market I do have no idea what this phone call was about I think it was an error I called the patient and told him to stay on all of his medications he said he would do so

## 2021-07-26 ENCOUNTER — Ambulatory Visit: Payer: Self-pay | Admitting: *Deleted

## 2021-07-26 NOTE — Telephone Encounter (Signed)
The pt has been evaluated and nothing found  would monitor for now

## 2021-07-26 NOTE — Telephone Encounter (Signed)
Routing to PCP for review.

## 2021-07-26 NOTE — Telephone Encounter (Signed)
Summary:Per agent: " pt calling back with cramping complaintsas before   Pt had some more cramps last nite and wants a fu call as he is drinking water. Wants to know what else he can do. [phone]"       Chief Complaint: leg cramps Symptoms: Cramping of both legs , calf muscle. Wants Dr. Delford Field to know he has been drinking a lot of water as advised and still has cramping in the mornings. Questioning if there is anything else he can do. Also asking if it would be OK if he took a teaspoon of Apple Cider Vinegar at bedtime.  Frequency: "Long time"  See other encounters Pertinent Negatives: Patient denies  Disposition: [] ED /[] Urgent Care (no appt availability in office) / [] Appointment(In office/virtual)/ []  [place] [facility]/ [] Home Care/ [] Refused Recommended Disposition /[] [facility]/ [x]  Follow-up with PCP Additional Notes: Please see encounter and advise. Thank you.  Reason for Disposition  Leg pain or muscle cramp is a chronic symptom (recurrent or ongoing AND present > 4 weeks)  Answer Assessment - Initial Assessment Questions 1. ONSET: "When did the pain start?"      "Long time" 2. LOCATION: "Where is the pain located?"      Both legs , calf muscles 3. PAIN: "How bad is the pain?"    (Scale 1-10; or mild, moderate, severe)   -  MILD (1-3): doesn't interfere with normal activities    -  MODERATE (4-7): interferes with normal activities (e.g., work or school) or awakens from sleep, limping    -  SEVERE (8-10): excruciating pain, unable to do any normal activities, unable to walk     In mornings only 4. WORK OR EXERCISE: "Has there been any recent work or exercise that involved this part of the body?"      No 5. CAUSE: "What do you think is causing the leg pain?"     Unsure 6. OTHER SYMPTOMS: "Do you have any other symptoms?" (e.g., chest pain, back pain, breathing difficulty, swelling, rash, fever, numbness, weakness)  Protocols used: Leg Pain-A-AH

## 2021-07-28 NOTE — Telephone Encounter (Signed)
Pt  stated he couldn't make it to the mobile unit /and if Dr. Delford Field can prescribe him something for his leg cramps / he also mentioned no one called him about the pain in his right foot / please advise    Pt. States he would like "something for these cramps sent to my pharmacy." Instructed of PCP message 07/26/21. Still requests medication. Please advise pt.

## 2021-07-31 ENCOUNTER — Telehealth: Payer: Self-pay | Admitting: Emergency Medicine

## 2021-07-31 NOTE — Telephone Encounter (Signed)
Copied from CRM [id number]. Topic: General - Other >> Jul 31, 2021  8:56 AM Esperanza Sheets wrote: Reason for CRM: Patient checking status of rx request regarding leg cramps  Please reference 7-5 TE and fu w/ patient

## 2021-08-01 NOTE — Telephone Encounter (Signed)
Pt will try to go to mobile unit today for leg cramping but called to check status of RX request for this issue / please advise

## 2021-08-01 NOTE — Telephone Encounter (Signed)
Pt wanting medication for cramps

## 2021-08-01 NOTE — Telephone Encounter (Signed)
Needs another exam

## 2021-08-02 ENCOUNTER — Encounter: Payer: Self-pay | Admitting: Physician Assistant

## 2021-08-02 ENCOUNTER — Ambulatory Visit: Payer: Self-pay | Admitting: *Deleted

## 2021-08-02 ENCOUNTER — Ambulatory Visit: Payer: Medicaid Other | Admitting: Physician Assistant

## 2021-08-02 VITALS — BP 119/80 | HR 75 | Resp 18 | Ht 71.0 in | Wt 198.0 lb

## 2021-08-02 DIAGNOSIS — R252 Cramp and spasm: Secondary | ICD-10-CM | POA: Diagnosis not present

## 2021-08-02 MED ORDER — CYCLOBENZAPRINE HCL 10 MG PO TABS: 10.0000 mg | ORAL_TABLET | Freq: Three times a day (TID) | ORAL | 0 refills | Status: DC | PRN

## 2021-08-02 NOTE — Telephone Encounter (Signed)
Patient states he was unable to be seen at mobile unit yesterday 7-11  Patient requesting rx, advised pt of provider's recommendation below  Encouraged patient to go and be seen at mobile unit today, patient states he will try

## 2021-08-02 NOTE — Telephone Encounter (Signed)
Called pt and he stated that he talked with his sister to get a ride, I told him to give me a call back if he wasn't able to get to the mobile clinic today and I will schedule him for next week

## 2021-08-02 NOTE — Patient Instructions (Signed)
You are going to stop the baclofen and do a trial of the Flexeril.  You can use this every 8 hours as needed.  Please let us know if there is anything else we can do for you.  Keep Pounding!  Roney Jaffe, PA-C Physician Assistant [facility] [email or web address]  Leg Cramps Leg cramps occur when one or more muscles tighten and a person has no control over it (involuntary muscle contraction). Muscle cramps are most common in the calf muscles of the leg. They can occur during exercise or at rest. Leg cramps are painful, and they may last for a few seconds to a few minutes. Cramps may return several times before they finally stop. Usually, leg cramps are not caused by a serious medical problem. In many cases, the cause is not known. Some common causes include: Excessive physical effort (overexertion), such as during intense exercise. Doing the same motion over and over. Staying in a certain position for a long period of time. Improper preparation, form, or technique while doing a sport or an activity. Dehydration. Injury. Side effects of certain medicines. Abnormally low levels of minerals in your blood (electrolytes), especially potassium and calcium. This could result from: Pregnancy. Taking diuretic medicines. Follow these instructions at home: Eating and drinking Drink enough fluid to keep your urine pale yellow. Staying hydrated may help prevent cramps. Eat a healthy diet that includes plenty of nutrients to help your muscles function. A healthy diet includes fruits and vegetables, lean protein, whole grains, and low-fat or nonfat dairy products. Managing pain, stiffness, and swelling     Try massaging, stretching, and relaxing the affected muscle. Do this for several minutes at a time. If directed, put ice on areas that are sore or painful after a cramp. To do this: Put ice in a plastic bag. Place a towel between your skin  and the bag. Leave the ice on for 20 minutes, 2-3 times a day. Remove the ice if your skin turns bright red. This is very important. If you cannot feel pain, heat, or cold, you have a greater risk of damage to the area. If directed, apply heat to muscles that are tense or tight. Do this before you exercise, or as often as told by your health care provider. Use the heat source that your health care provider recommends, such as a moist heat pack or a heating pad. To do this: Place a towel between your skin and the heat source. Leave the heat on for 20-30 minutes. Remove the heat if your skin turns bright red. This is especially important if you are unable to feel pain, heat, or cold. You may have a greater risk of getting burned. Try taking hot showers or baths to help relax tight muscles. General instructions If you are having frequent leg cramps, avoid intense exercise for several days. Take over-the-counter and prescription medicines only as told by your health care provider. Keep all follow-up visits. This is important. Contact a health care provider if: Your leg cramps get more severe or more frequent, or they do not improve over time. Your foot becomes cold, numb, or blue. Summary Muscle cramps can develop in any muscle, but the most common place is in the calf muscles of the leg. Leg cramps are painful, and they may last for a few seconds to a few minutes. Usually, leg cramps are not caused by a serious medical problem. Often, the cause is not known. Stay hydrated, and take over-the-counter and prescription  medicines only as told by your health care provider. This information is not intended to replace advice given to you by your health care provider. Make sure you discuss any questions you have with your health care provider. Document Revised: 05/27/2019 Document Reviewed: 05/27/2019 Elsevier Patient Education  2023 [employer].

## 2021-08-02 NOTE — Progress Notes (Signed)
   Established Patient Office Visit  Subjective   Patient ID: Berish Bohman, male    DOB: 01/07/1961  Age: 61 y.o. MRN: [id number]  Chief Complaint  Patient presents with  . leg cramps    Legs and feet for a few years.    Urine is clear 4-5 bottles of water    {History (Optional):23778}  ROS    Objective:     BP 119/80 (BP Location: Left Arm, Patient Position: Sitting, Cuff Size: Normal)   Pulse 75   Resp 18   Ht 5\' 11"  (1.803 m)   Wt 198 lb (89.8 kg)   SpO2 96%   BMI 27.62 kg/m  {Vitals History (Optional):23777}  Physical Exam   No results found for any visits on 08/02/21.  {Labs (Optional):23779}  The 10-year ASCVD risk score (Arnett DK, et al., 2019) is: 9.8%    Assessment & Plan:   Problem List Items Addressed This Visit   None   No follow-ups on file.    2020 [place]ers, PA-C

## 2021-08-02 NOTE — Telephone Encounter (Signed)
ssage from Aretta Nip sent at 08/02/2021  9:16 AM EDT  Summary: pt want vaccine   PT insist, states heard on news that need to get vaccine as our mosquitoes are carrying malaria. Want fu call [phone]           Call History   Type Contact Phone/Fax User  08/02/2021 09:12 AM EDT Phone (Incoming) Jeremiah Amber "Smooth" (Self)  Aretta Nip   Reason for Disposition  Health Information question, no triage required and triager able to answer question  Answer Assessment - Initial Assessment Questions 1. REASON FOR CALL or QUESTION: "What is your reason for calling today?" or "How can I best help you?" or "What question do you have that I can help answer?"     Returned pt's call regarding malaria and vaccine.  I wanted to know if there is protection for malaria.   I saw on TV about malaria.   I think it's in [place].    No symptoms of malaria but I was wondering if there was a vaccine or something for it.   "I don't want to get bit by  mosquito".  I gave him reassurance that there wasn't any cases of malaria locally that we are aware of.   No vaccines being offered at this time  He thanked me for calling him back and that he felt better now.  Protocols used: Information Only Call - No Triage-A-AH

## 2021-08-10 ENCOUNTER — Ambulatory Visit (HOSPITAL_COMMUNITY)
Admission: EM | Admit: 2021-08-10 | Discharge: 2021-08-10 | Disposition: A | Discharge status: Home / Self Care | Payer: Medicaid Other | Attending: Emergency Medicine | Admitting: Emergency Medicine

## 2021-08-10 ENCOUNTER — Encounter (HOSPITAL_COMMUNITY): Payer: Self-pay | Admitting: Emergency Medicine

## 2021-08-10 DIAGNOSIS — Z20822 Contact with and (suspected) exposure to covid-19: Secondary | ICD-10-CM

## 2021-08-10 DIAGNOSIS — R110 Nausea: Secondary | ICD-10-CM

## 2021-08-10 LAB — SARS CORONAVIRUS 2 (TAT 6-24 HRS): SARS Coronavirus 2: NEGATIVE

## 2021-08-10 MED ORDER — ONDANSETRON 4 MG PO TBDP
4.0000 mg | ORAL_TABLET | Freq: Once | ORAL | Status: AC
Start: 2021-08-10 — End: 2021-08-10
  Administered 2021-08-10: 4 mg via ORAL

## 2021-08-10 MED ORDER — ONDANSETRON 4 MG PO TBDP
ORAL_TABLET | ORAL | Status: AC
  Filled 2021-08-10: qty 1

## 2021-08-10 MED ORDER — ONDANSETRON 4 MG PO TBDP: 4.0000 mg | ORAL_TABLET | Freq: Three times a day (TID) | ORAL | 0 refills | Status: DC | PRN

## 2021-08-10 NOTE — ED Provider Notes (Signed)
[facility]    CSN: [id number] Arrival date & time: 08/10/21  0802      History   Chief Complaint Chief Complaint  Patient presents with   Weird Feeling    HPI Jeremiah Hunter is a 61 y.o. male.  Presents with 1 day history of "feeling weird". He cannot further describe this feeling. Feels tired overall. Yesterday had a little headache and nasal congestion that has since resolved. Reports nausea this morning, has gotten better over the morning but still present. Last BM this morning, normal for him. Eating and drinking lots of water.  Denies any fever, chills, eye or ear symptoms, sore throat, chest pain or tightness, shortness of breath or trouble breathing, abdominal pain, vomiting/diarrhea/constipation, rash. No dizziness, lightheadedness, vision changes, memory problems, gait imbalance, weakness, numbness or tingling.  No known sick contacts. No new medications.  Does have history of anxiety, MDD with psychotic features, bipolar. On medicine. History of seizure disorder, on Depakote. Reports no recent seizures.  Past Medical History:  Diagnosis Date   Acute bilateral low back pain without sciatica 07/30/2019   Allergy    on meds   Anxiety    on meds   DDD (degenerative disc disease), lumbar    on meds   Enlarged prostate    GERD (gastroesophageal reflux disease)    on meds   Hypertension    on meds   Severe episode of recurrent major depressive disorder, with psychotic features (HCC) 03/24/2019   on meds   Sleep apnea    uses CPAP    Patient Active Problem List   Diagnosis Date Noted   Leg cramps 06/29/2021   Aphthous ulcer 06/21/2021   Vitamin D deficiency 06/13/2021   Pain due to onychomycosis of toenails of both feet 03/13/2021   Itching 01/26/2021   Frequent falls 12/08/2020   Lumbar foraminal stenosis 12/07/2020   DDD (degenerative disc disease), lumbar    Prolapsed internal hemorrhoids, grade 3 09/25/2019   Meralgia paresthetica of  left side 09/11/2019   Bipolar affective disorder, currently depressed, moderate (HCC) 09/11/2019   External hemorrhoids with complication 07/30/2019   Overactive bladder 06/11/2019   OSA (obstructive sleep apnea) 03/24/2019   Essential hypertension 03/24/2019   Seizure disorder (HCC) 03/24/2019    Past Surgical History:  Procedure Laterality Date   CYSTECTOMY     DENTAL SURGERY     teeth pulled   HERNIA REPAIR         Home Medications    Prior to Admission medications   Medication Sig Start Date End Date Taking? Authorizing Provider  amLODipine (NORVASC) 5 MG tablet TAKE 1 TABLET(5 MG) BY MOUTH DAILY 06/13/21  Yes Storm Frisk, MD  azelastine (ASTELIN) 0.1 % nasal spray Place 2 sprays into both nostrils 2 (two) times daily. Use in each nostril as directed 05/17/21  Yes Storm Frisk, MD  B Complex-C-E-Zn (EQL STRESS B-COMPLEX C/ZINC) TABS One a day po. With food. 09/11/19  Yes Dohmeier, Porfirio Mylar, MD  chlorhexidine (PERIDEX) 0.12 % solution Use as directed 15 mLs in the mouth or throat 2 (two) times daily. 06/21/21  Yes Storm Frisk, MD  cholecalciferol (VITAMIN D3) 25 MCG (1000 UNIT) tablet Take 2,000 Units by mouth daily.   Yes [provider]  Cyanocobalamin (VITAMIN B-12 PO) Take 1 tablet by mouth daily at 6 (six) AM.   Yes [provider]  cyclobenzaprine (FLEXERIL) 10 MG tablet Take 1 tablet (10 mg total) by mouth 3 (three) times  daily as needed for muscle spasms. 08/02/21  Yes Mayers, Cari S, PA-C  divalproex (DEPAKOTE ER) 500 MG 24 hr tablet Take 1 tablet (500 mg total) by mouth in the morning and at bedtime. 06/13/21  Yes Storm Frisk, MD  FLUoxetine (PROZAC) 20 MG capsule Take 1 capsule (20 mg total) by mouth daily. 06/13/21  Yes Storm Frisk, MD  hydrOXYzine (VISTARIL) 25 MG capsule Take 1 capsule (25 mg total) by mouth every 8 (eight) hours as needed. 06/13/21  Yes Storm Frisk, MD  lidocaine-prilocaine (EMLA) cream Apply 1  application topically as needed. 09/27/20  Yes Storm Frisk, MD  loratadine (CLARITIN) 10 MG tablet TAKE 1 TABLET(10 MG) BY MOUTH DAILY 02/18/21  Yes Storm Frisk, MD  ondansetron (ZOFRAN-ODT) 4 MG disintegrating tablet Take 1 tablet (4 mg total) by mouth every 8 (eight) hours as needed for nausea or vomiting. 08/10/21  Yes Allsion Nogales, Lurena Joiner, PA-C  OXYTROL 3.9 MG/24HR Apply patch two times a week 06/13/21  Yes Storm Frisk, MD  QUEtiapine (SEROQUEL) 100 MG tablet Take 1 tablet (100 mg total) by mouth at bedtime. 06/13/21  Yes Storm Frisk, MD  Vitamin D, Ergocalciferol, (DRISDOL) 1.25 MG (50000 UNIT) CAPS capsule Take 1 capsule (50,000 Units total) by mouth every 7 (seven) days. 06/14/21  Yes Storm Frisk, MD    Family History Family History  Family history unknown: Yes    Social History Social History   Tobacco Use   Smoking status: Never   Smokeless tobacco: Never  Vaping Use   Vaping Use: Never used  Substance Use Topics   Alcohol use: Never   Drug use: Never     Allergies   Losartan   Review of Systems Review of Systems  Per HPI  Physical Exam Triage Vital Signs ED Triage Vitals  Enc Vitals Group     BP 08/10/21 0828 118/82     Pulse Rate 08/10/21 0828 77     Resp 08/10/21 0828 18     Temp 08/10/21 0828 98.9 F (37.2 C)     Temp Source 08/10/21 0828 Oral     SpO2 08/10/21 0828 94 %     Weight 08/10/21 0830 205 lb (93 kg)     Height 08/10/21 0830 5\' 11"  (1.803 m)     Head Circumference --      Peak Flow --      Pain Score 08/10/21 0829 4     Pain Loc --      Pain Edu? --      Excl. in GC? --    No data found.  Updated Vital Signs BP 118/82 (BP Location: Left Arm)   Pulse 77   Temp 98.9 F (37.2 C) (Oral)   Resp 18   Ht 5\' 11"  (1.803 m)   Wt 205 lb (93 kg)   SpO2 94%   BMI 28.59 kg/m    Physical Exam Vitals and nursing note reviewed.  Constitutional:      General: He is not in acute distress.    Appearance: He is not  ill-appearing.  HENT:     Head: Normocephalic and atraumatic.     Nose: Nose normal.     Mouth/Throat:     Mouth: Mucous membranes are moist.     Pharynx: Oropharynx is clear.  Eyes:     Extraocular Movements: Extraocular movements intact.     Conjunctiva/sclera: Conjunctivae normal.     Pupils: Pupils are equal, round, and reactive  to light.  Cardiovascular:     Rate and Rhythm: Normal rate and regular rhythm.     Pulses: Normal pulses.     Heart sounds: Normal heart sounds.  Pulmonary:     Effort: Pulmonary effort is normal. No respiratory distress.     Breath sounds: Normal breath sounds.  Abdominal:     Palpations: Abdomen is soft.     Tenderness: There is no abdominal tenderness.     Comments: Some mild tenderness to palpation of left lower quadrant  Musculoskeletal:        General: Normal range of motion.     Cervical back: Normal range of motion. No rigidity.  Lymphadenopathy:     Cervical: No cervical adenopathy.  Neurological:     General: No focal deficit present.     Mental Status: He is alert and oriented to person, place, and time.     Cranial Nerves: Cranial nerves 2-12 are intact. No cranial nerve deficit or facial asymmetry.     Sensory: Sensation is intact. No sensory deficit.     Motor: Motor function is intact. No weakness.     Coordination: Coordination is intact. Coordination normal.     Gait: Gait normal.     Comments: Strength 5/5 all extrem, sensation intact, full ROM. Walks with cane at baseline      UC Treatments / Results  Labs (all labs ordered are listed, but only abnormal results are displayed) Labs Reviewed  SARS CORONAVIRUS 2 (TAT 6-24 HRS)    EKG  Radiology No results found.  Procedures Procedures (including critical care time)  Medications Ordered in UC Medications  ondansetron (ZOFRAN-ODT) disintegrating tablet 4 mg (4 mg Oral Given 08/10/21 3500)    Initial Impression / Assessment and Plan / UC Course  I have reviewed the  triage vital signs and the nursing notes.  Pertinent labs & imaging results that were available during my care of the patient were reviewed by me and considered in my medical decision making (see chart for details).  He is overall well-appearing.  Physical exam is unremarkable, neurological exam unremarkable. Dose of ODT Zofran given for nausea with improvement of symptoms. COVID test pending. Could be start of a virus or stomach virus, hard to say given only 1 day of symptoms and unclear history.  At this time I recommend watch and wait.  Can try Zofran for nausea.  Emergency department precautions, otherwise follow-up with primary care provider if symptoms persist.  Patient agrees with plan, discharged in stable condition.  Final Clinical Impressions(s) / UC Diagnoses   Final diagnoses:  Encounter for laboratory testing for COVID-19 virus  Nausea     Discharge Instructions      We will call you if your COVID test is positive.  You can try the Zofran every 8 hours as needed. This medicine can help with the upset stomach feeling you were having.  Please go to the emergency department with any worsening symptoms.  Otherwise, follow-up with your primary care provider.     ED Prescriptions     Medication Sig Dispense Auth. Provider   ondansetron (ZOFRAN-ODT) 4 MG disintegrating tablet Take 1 tablet (4 mg total) by mouth every 8 (eight) hours as needed for nausea or vomiting. 12 tablet Jasun Gasparini, Lurena Joiner, PA-C      PDMP not reviewed this encounter.   Marlow Baars, [place] 08/10/21 [phone]

## 2021-08-10 NOTE — Discharge Instructions (Addendum)
We will call you if your COVID test is positive.  You can try the Zofran every 8 hours as needed. This medicine can help with the upset stomach feeling you were having.  Please go to the emergency department with any worsening symptoms.  Otherwise, follow-up with your primary care provider.

## 2021-08-10 NOTE — ED Triage Notes (Signed)
Patient states that he is feeling "weird", no cough, no nasal drainage, slight headache, no ear pain x 3 days.  Patient has taken some OTC Metamucil.

## 2021-08-11 ENCOUNTER — Emergency Department (HOSPITAL_COMMUNITY)
Admission: EM | Admit: 2021-08-11 | Discharge: 2021-08-11 | Disposition: A | Discharge status: Home / Self Care | Payer: Medicaid Other | Attending: Emergency Medicine | Admitting: Emergency Medicine

## 2021-08-11 ENCOUNTER — Other Ambulatory Visit: Payer: Self-pay

## 2021-08-11 ENCOUNTER — Encounter (HOSPITAL_COMMUNITY): Payer: Self-pay

## 2021-08-11 DIAGNOSIS — W19XXXA Unspecified fall, initial encounter: Secondary | ICD-10-CM | POA: Insufficient documentation

## 2021-08-11 DIAGNOSIS — G8929 Other chronic pain: Secondary | ICD-10-CM | POA: Diagnosis not present

## 2021-08-11 DIAGNOSIS — S8991XA Unspecified injury of right lower leg, initial encounter: Secondary | ICD-10-CM | POA: Diagnosis present

## 2021-08-11 DIAGNOSIS — I10 Essential (primary) hypertension: Secondary | ICD-10-CM | POA: Insufficient documentation

## 2021-08-11 DIAGNOSIS — S80211A Abrasion, right knee, initial encounter: Secondary | ICD-10-CM | POA: Insufficient documentation

## 2021-08-11 DIAGNOSIS — M79671 Pain in right foot: Secondary | ICD-10-CM | POA: Diagnosis not present

## 2021-08-11 DIAGNOSIS — M545 Low back pain, unspecified: Secondary | ICD-10-CM | POA: Diagnosis not present

## 2021-08-11 MED ORDER — PREDNISONE 10 MG (21) PO TBPK: ORAL_TABLET | Freq: Every day | ORAL | 0 refills | Status: DC

## 2021-08-11 NOTE — Discharge Instructions (Signed)
You were seen today for a worsening of your chronic back pain after a fall.  I have prescribed a short course of steroids to help with your inflammation.  Your right knee has an abrasion.  Please keep the area clean and use ice on the right knee as needed.  Please keep your scheduled podiatry follow-up for next week.

## 2021-08-11 NOTE — ED Triage Notes (Signed)
Patient states he was walking and a dog chased him. Patient fell and is now c/o low back pain.

## 2021-08-11 NOTE — ED Provider Notes (Signed)
[facility]-EMERGENCY DEPT Provider Note   CSN: [id number] Arrival date & time: 08/11/21  1036     History  Chief Complaint  Patient presents with   Back Pain   Fall    Jeremiah Hunter is a 61 y.o. male.  Patient presents to the hospital complaining of back pain and right knee pain secondary to a fall.  Patient states he was walking outside of his apartment when the neighbors dog charged him when he fell.  He fell landing on the anterior portion of his right knee.  The patient states he has chronic back pain and that this aggravated his underlying back pain.  He denies hitting his head, denies losing consciousness.  Has past medical history significant for hypertension, anxiety, GERD, degenerative disc disease, bilateral low back pain  HPI     Home Medications Prior to Admission medications   Medication Sig Start Date End Date Taking? Authorizing Provider  predniSONE (STERAPRED UNI-PAK 21 TAB) 10 MG (21) TBPK tablet Take by mouth daily. Take 6 tabs by mouth daily  for 2 days, then 5 tabs for 2 days, then 4 tabs for 2 days, then 3 tabs for 2 days, 2 tabs for 2 days, then 1 tab by mouth daily for 2 days 08/11/21  Yes Barrie Dunker B, PA-C  amLODipine (NORVASC) 5 MG tablet TAKE 1 TABLET(5 MG) BY MOUTH DAILY 06/13/21   Storm Frisk, MD  azelastine (ASTELIN) 0.1 % nasal spray Place 2 sprays into both nostrils 2 (two) times daily. Use in each nostril as directed 05/17/21   Storm Frisk, MD  B Complex-C-E-Zn (EQL STRESS B-COMPLEX C/ZINC) TABS One a day po. With food. 09/11/19   Dohmeier, Porfirio Mylar, MD  chlorhexidine (PERIDEX) 0.12 % solution Use as directed 15 mLs in the mouth or throat 2 (two) times daily. 06/21/21   Storm Frisk, MD  cholecalciferol (VITAMIN D3) 25 MCG (1000 UNIT) tablet Take 2,000 Units by mouth daily.    [provider]  Cyanocobalamin (VITAMIN B-12 PO) Take 1 tablet by mouth daily at 6 (six) AM.    [provider]   cyclobenzaprine (FLEXERIL) 10 MG tablet Take 1 tablet (10 mg total) by mouth 3 (three) times daily as needed for muscle spasms. 08/02/21   Mayers, Cari S, PA-C  divalproex (DEPAKOTE ER) 500 MG 24 hr tablet Take 1 tablet (500 mg total) by mouth in the morning and at bedtime. 06/13/21   Storm Frisk, MD  FLUoxetine (PROZAC) 20 MG capsule Take 1 capsule (20 mg total) by mouth daily. 06/13/21   Storm Frisk, MD  hydrOXYzine (VISTARIL) 25 MG capsule Take 1 capsule (25 mg total) by mouth every 8 (eight) hours as needed. 06/13/21   Storm Frisk, MD  lidocaine-prilocaine (EMLA) cream Apply 1 application topically as needed. 09/27/20   Storm Frisk, MD  loratadine (CLARITIN) 10 MG tablet TAKE 1 TABLET(10 MG) BY MOUTH DAILY 02/18/21   Storm Frisk, MD  ondansetron (ZOFRAN-ODT) 4 MG disintegrating tablet Take 1 tablet (4 mg total) by mouth every 8 (eight) hours as needed for nausea or vomiting. 08/10/21   Rising, Lurena Joiner, PA-C  OXYTROL 3.9 MG/24HR Apply patch two times a week 06/13/21   Storm Frisk, MD  QUEtiapine (SEROQUEL) 100 MG tablet Take 1 tablet (100 mg total) by mouth at bedtime. 06/13/21   Storm Frisk, MD  Vitamin D, Ergocalciferol, (DRISDOL) 1.25 MG (50000 UNIT) CAPS capsule Take 1 capsule (50,000 Units total)  by mouth every 7 (seven) days. 06/14/21   Storm Frisk, MD      Allergies    Losartan    Review of Systems   Review of Systems  Musculoskeletal:  Positive for arthralgias and back pain.  Skin:  Positive for wound.  Neurological:  Negative for syncope.    Physical Exam Updated Vital Signs BP 122/89 (BP Location: Left Arm)   Pulse 99   Temp 98.9 F (37.2 C) (Oral)   Resp 18   SpO2 95%  Physical Exam Vitals and nursing note reviewed.  Constitutional:      General: He is not in acute distress. HENT:     Head: Normocephalic and atraumatic.     Mouth/Throat:     Mouth: Mucous membranes are moist.  Eyes:     Conjunctiva/sclera: Conjunctivae  normal.  Cardiovascular:     Rate and Rhythm: Normal rate and regular rhythm.     Pulses: Normal pulses.     Heart sounds: Normal heart sounds.  Pulmonary:     Effort: Pulmonary effort is normal.     Breath sounds: Normal breath sounds.  Musculoskeletal:        General: Tenderness present. Normal range of motion.     Cervical back: Normal range of motion and neck supple.     Comments: Normal range of motion noted with the right knee.  No tenderness to palpation.  No swelling noted. Tenderness to palpation across the lumbar region bilaterally  Skin:    General: Skin is warm and dry.     Comments: Abrasion noted to the anterior knee  Neurological:     Mental Status: He is alert and oriented to person, place, and time.     Cranial Nerves: No cranial nerve deficit.     Sensory: No sensory deficit.     ED Results / Procedures / Treatments   Labs (all labs ordered are listed, but only abnormal results are displayed) Labs Reviewed - No data to display  EKG None  Radiology No results found.  Procedures Procedures    Medications Ordered in ED Medications - No data to display  ED Course/ Medical Decision Making/ A&P                           Medical Decision Making  The patient presents with a chief complaint of worsening back pain.  Differential includes worsening of chronic back pain, herniated disc, fracture, dislocation  I reviewed the patient's medical history including notes showing that he has an upcoming visit with podiatry  I considered obtaining imaging of the patient's lumbar spine.  The patient has no red flag symptoms such as urinary incontinence, fecal incontinence, urinary retention, saddle anesthesia.  The patient describes this is a worsening of his chronic pain.  I have low suspicion of a fracture, dislocation, or herniated disc at this time.  He is also not complaining of radicular symptoms.  Plan to treat the patient home with a short course of steroids and  have him follow-up with his primary care provider.  The patient's right knee has normal range of motion and appears to be just an abrasion.  I see no utility in plain films of the right knee at this time.  The patient may ice the knee as needed and should keep the area clean.  Prior to discharge the patient states that he has pain in the bottom of his right foot.  He states  this is not new.  He also endorses having an appoint with podiatry next week for evaluation.  I see no utility in evaluation in the emergency department as he would likely be referred to podiatry for definitive care.  Discharge home at this time.  Prednisone taper pack prescribed.        Final Clinical Impression(s) / ED Diagnoses Final diagnoses:  Chronic bilateral low back pain, unspecified whether sciatica present  Knee abrasion, right, initial encounter  Right foot pain    Rx / DC Orders ED Discharge Orders          Ordered    predniSONE (STERAPRED UNI-PAK 21 TAB) 10 MG (21) TBPK tablet  Daily        08/11/21 1211              Pamala Duffel 08/11/21 1212    Cathren Laine, MD 08/12/21 1955

## 2021-08-11 NOTE — ED Triage Notes (Signed)
Pt BIB EMS from home home. Pt was being chased by a dog and fell. Pt has abrasion to R knee. Pt endorses low back pain and R leg pain and numbness.

## 2021-08-14 ENCOUNTER — Ambulatory Visit: Payer: Medicaid Other | Admitting: Podiatry

## 2021-08-17 ENCOUNTER — Ambulatory Visit: Payer: Medicaid Other | Admitting: Podiatry

## 2021-08-21 IMAGING — CT CT HEAD W/O CM
3 series · 14 of 47 positions shown, 16 images · non-contrast
Comparison: Head CT 05/15/2019.

CLINICAL DATA: Neuro deficit, acute, stroke suspected. Additional
provided: Patient reports left leg numbness since [REDACTED].

EXAM:
CT HEAD WITHOUT CONTRAST
TECHNIQUE: Contiguous axial images were obtained from the base of the skull
through the vertex without intravenous contrast.

[Series 3: head 5.0 h30s · axial · 0.46mm/px · z∈[-134,+11]mm · 8 of 35 slices shown, 10 images]
[im 3/35  brain]
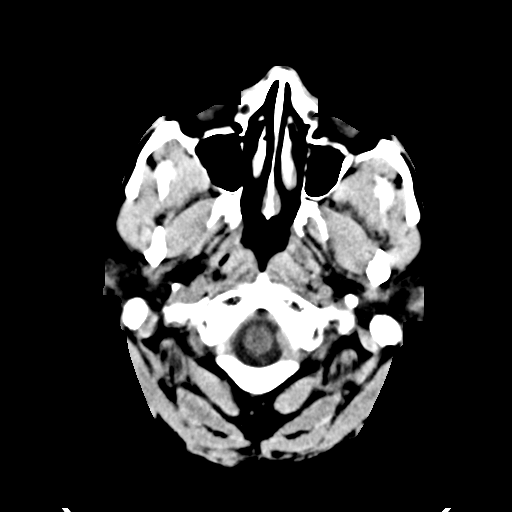
[im 3/35  bone]
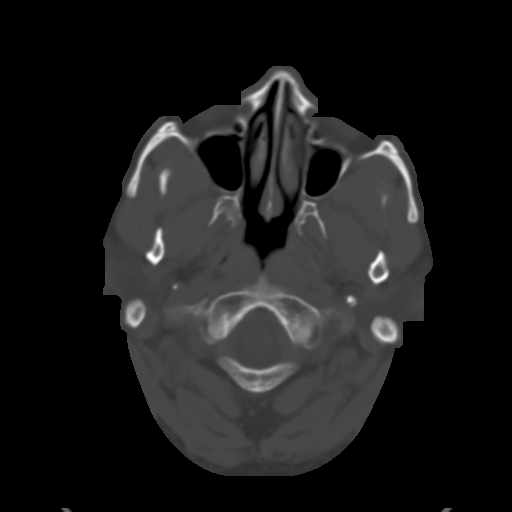
[im 8/35  brain]
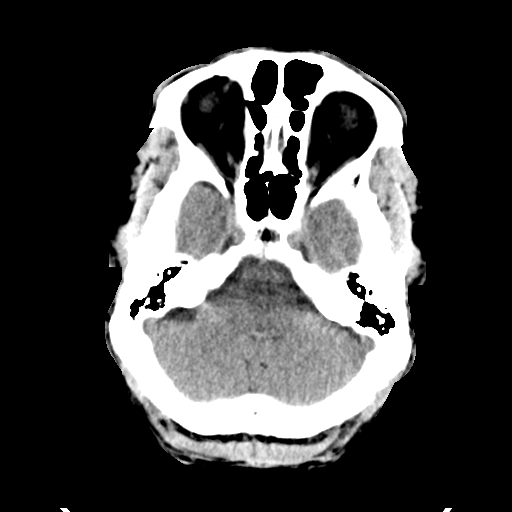
[im 11/35  brain]
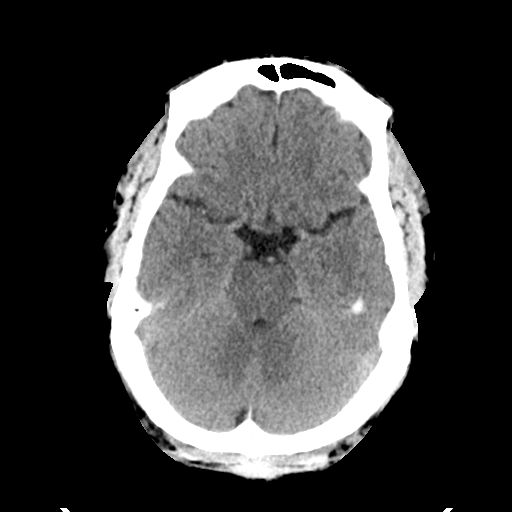
[im 16/35  brain]
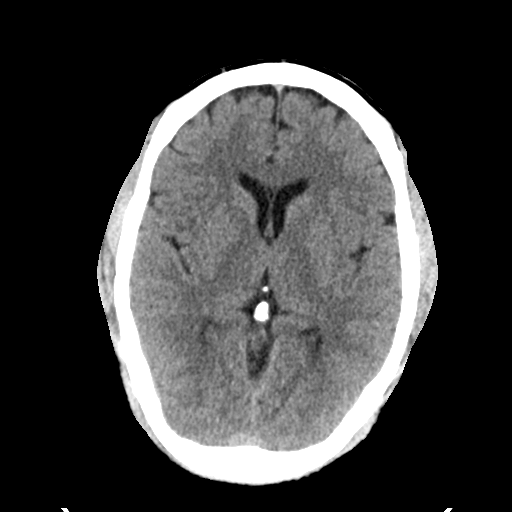
[im 19/35  brain]
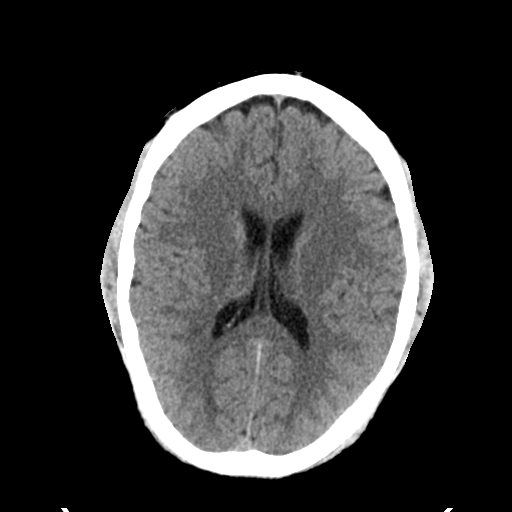
[im 19/35  bone]
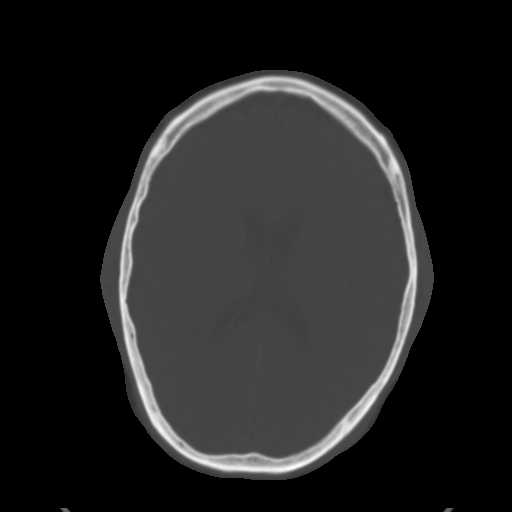
[im 24/35  brain]
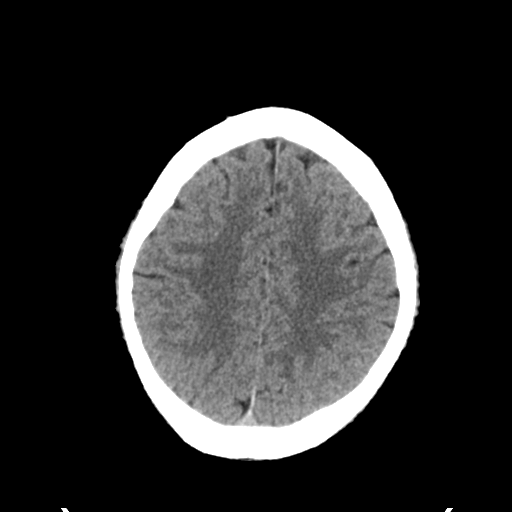
[im 27/35  brain]
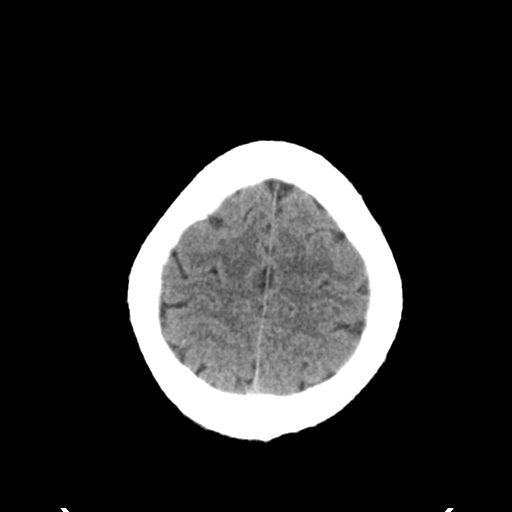
[im 32/35  brain]
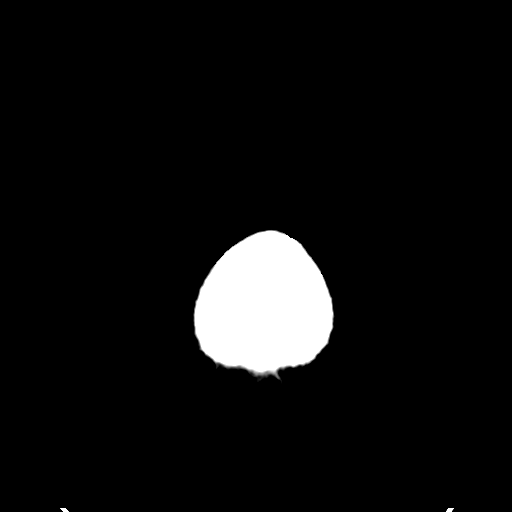

[Series 5: head 3.0 mpr cor · coronal · 0.34mm/px · 3 of 77 slices shown]
[im 26/77  brain]
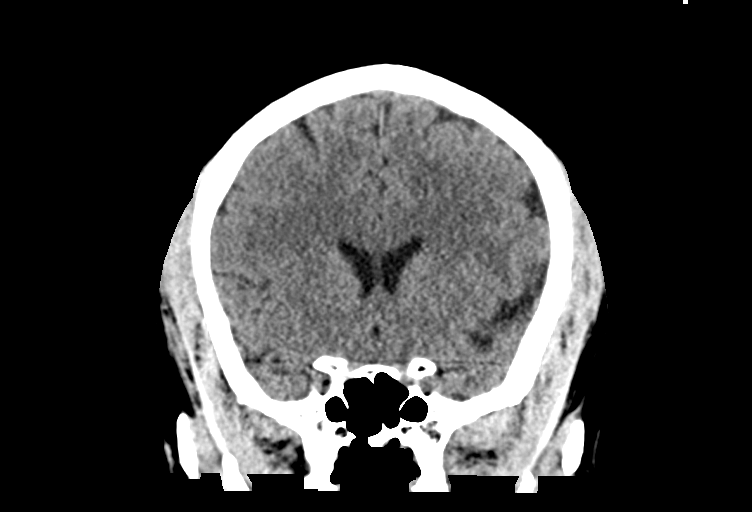
[im 34/77  brain]
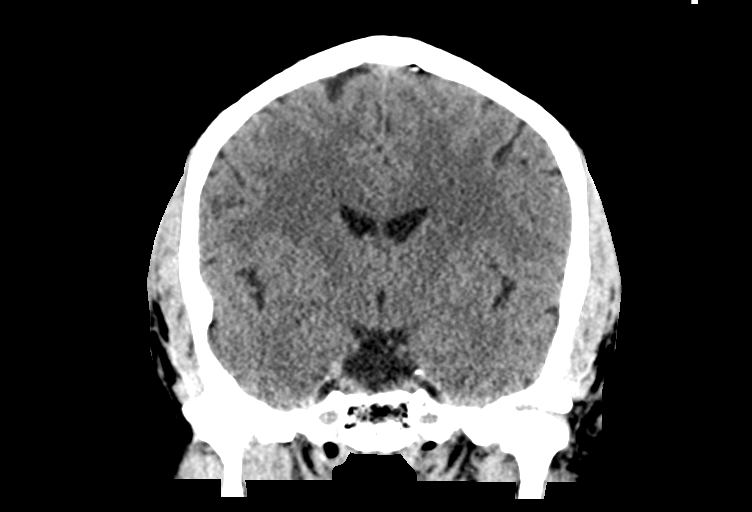
[im 43/77  brain]
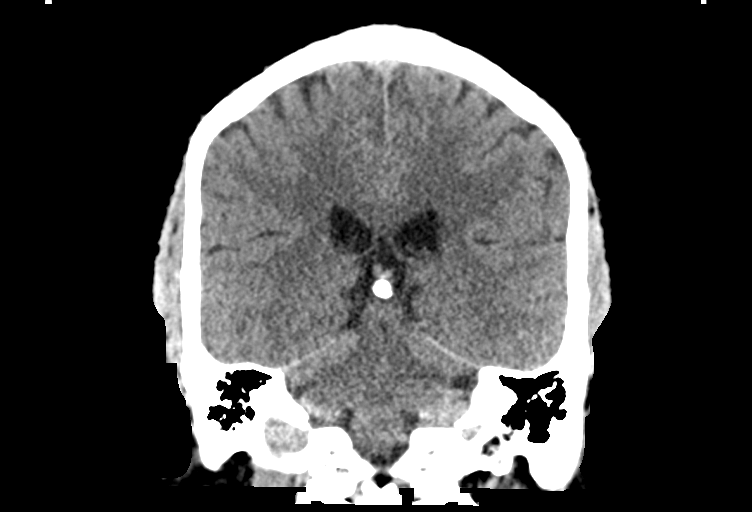

[Series 6: head 3.0 mpr sag · sagittal · 0.36mm/px · 3 of 67 slices shown]
[im 23/67  brain]
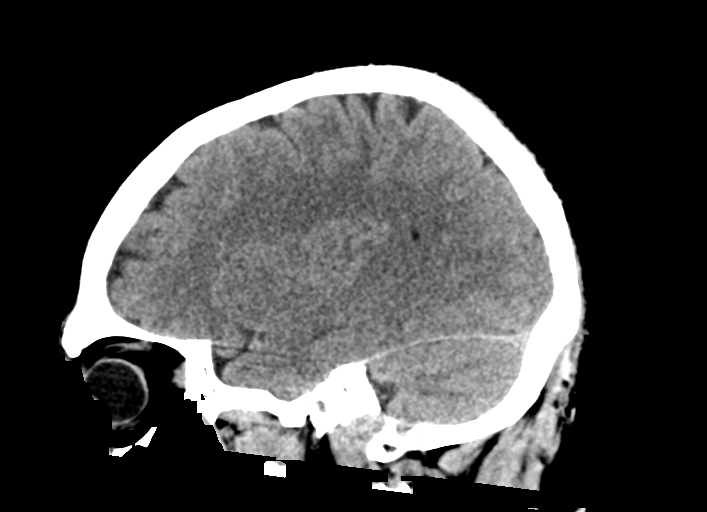
[im 34/67  brain]
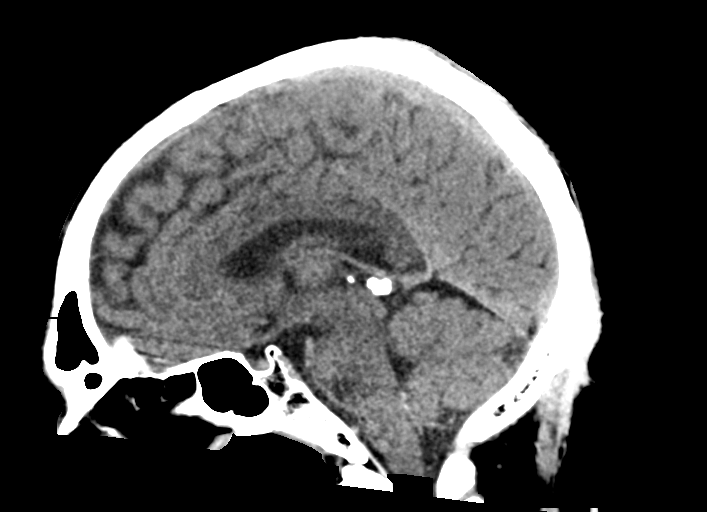
[im 45/67  brain]
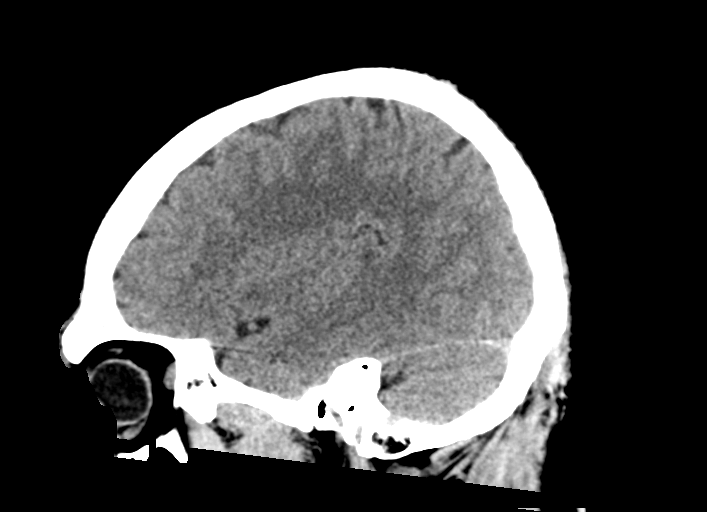

[14 of 47 positions shown; findings below may reference images not displayed]

FINDINGS: Brain:

Cerebral volume is normal for age.

Prominent perivascular space versus minimal chronic small vessel
ischemic change within the right temporoparietal periventricular
white matter (series 3, image 20).

There is no acute intracranial hemorrhage.

No demarcated cortical infarct.

No extra-axial fluid collection.

No evidence of intracranial mass.

No midline shift.

Vascular: No hyperdense vessel.

Skull: Normal. Negative for fracture or focal lesion.

Sinuses/Orbits: Visualized orbits show no acute finding. Chronic
medially displaced deformity of the left lamina papyracea. Mild
ethmoid sinus mucosal thickening. No significant mastoid effusion.
IMPRESSION: No CT evidence of acute intracranial abnormality.

Redemonstrated prominent perivascular space versus minimal chronic
small-vessel ischemic change within the right temporoparietal
periventricular white matter.

Chronic medially displaced deformity of the left lamina papyracea.

Mild ethmoid sinus mucosal thickening.

## 2021-08-24 ENCOUNTER — Encounter: Payer: Self-pay | Admitting: Critical Care Medicine

## 2021-08-25 ENCOUNTER — Ambulatory Visit: Payer: Self-pay

## 2021-08-25 NOTE — Telephone Encounter (Signed)
  Chief Complaint: ear pain Symptoms: L ear pain near opening of canal, tenderness, HA at times Frequency: 1 week Pertinent Negatives: Patient denies drainage, decreased hearing, dizziness Disposition: [] ED /[x] Urgent Care (no appt availability in office) / [] Appointment(In office/virtual)/ []  [facility]/ [] Home Care/ [] Refused Recommended Disposition /[] [facility]/ []  Follow-up with PCP Additional Notes: pt had sent Mychart message to Dr. but needed more information and pt felt it was best to call in. Pt hasn't taken anything for pain yet so advised to go ahead and do that and then due to no appts available advised him to go to UC. Pt states he will try to go within 24 hrs, he has to find transportation.   Reason for Disposition  [1] SEVERE pain AND [2] not improved 2 hours after taking analgesic medication (e.g., ibuprofen or acetaminophen)  Answer Assessment - Initial Assessment Questions 1. LOCATION: "Which ear is involved?"     L ear  2. ONSET: "When did the ear start hurting"      1 week  3. SEVERITY: "How bad is the pain?"  (Scale 1-10; mild, moderate or severe)   - MILD (1-3): doesn't interfere with normal activities    - MODERATE (4-7): interferes with normal activities or awakens from sleep    - SEVERE (8-10): excruciating pain, unable to do any normal activities      10, tenderness 6. CAUSE: "Have you been swimming recently?", "How often do you use Q-TIPS?", "Have you had any recent air travel or scuba diving?"     no 7. OTHER SYMPTOMS: "Do you have any other symptoms?" (e.g., headache, stiff neck, dizziness, vomiting, runny nose, decreased hearing)     HA,  Protocols used: Earache-A-AH

## 2021-08-26 ENCOUNTER — Encounter (HOSPITAL_COMMUNITY): Payer: Self-pay | Admitting: *Deleted

## 2021-08-26 ENCOUNTER — Ambulatory Visit (HOSPITAL_COMMUNITY)
Admission: EM | Admit: 2021-08-26 | Discharge: 2021-08-26 | Disposition: A | Discharge status: Home / Self Care | Payer: Medicaid Other | Attending: Physician Assistant | Admitting: Physician Assistant

## 2021-08-26 ENCOUNTER — Other Ambulatory Visit: Payer: Self-pay

## 2021-08-26 DIAGNOSIS — H9202 Otalgia, left ear: Secondary | ICD-10-CM | POA: Diagnosis not present

## 2021-08-26 DIAGNOSIS — H6012 Cellulitis of left external ear: Secondary | ICD-10-CM | POA: Diagnosis not present

## 2021-08-26 MED ORDER — CEPHALEXIN 500 MG PO CAPS: 500.0000 mg | ORAL_CAPSULE | Freq: Three times a day (TID) | ORAL | 0 refills | Status: DC

## 2021-08-26 MED ORDER — IBUPROFEN 600 MG PO TABS: 600.0000 mg | ORAL_TABLET | Freq: Three times a day (TID) | ORAL | 0 refills | Status: DC

## 2021-08-26 NOTE — ED Provider Notes (Signed)
[facility]    CSN: [id number] Arrival date & time: 08/26/21  1003      History   Chief Complaint Chief Complaint  Patient presents with   external ear pain    HPI Jeremiah Hunter is a 61 y.o. male.   61 year old male presents with left ear pain.  It indicates for the past week he has had progressive left ear pain on the top and the inside of the ear auricle.  Patient relates that it has become very tender, mild swelling and redness.  Patient relates he has been taking ibuprofen with mild relief from the discomfort.  He indicates that he has not had fever, chills, and no upper respiratory symptoms of sinus congestion, postnasal drip or rhinitis.  Patient relates he has not traumatized the left ear, and has not had any water exposure.     Past Medical History:  Diagnosis Date   Acute bilateral low back pain without sciatica 07/30/2019   Allergy    on meds   Anxiety    on meds   DDD (degenerative disc disease), lumbar    on meds   Enlarged prostate    GERD (gastroesophageal reflux disease)    on meds   Hypertension    on meds   Severe episode of recurrent major depressive disorder, with psychotic features (HCC) 03/24/2019   on meds   Sleep apnea    uses CPAP    Patient Active Problem List   Diagnosis Date Noted   Leg cramps 06/29/2021   Aphthous ulcer 06/21/2021   Vitamin D deficiency 06/13/2021   Pain due to onychomycosis of toenails of both feet 03/13/2021   Itching 01/26/2021   Frequent falls 12/08/2020   Lumbar foraminal stenosis 12/07/2020   DDD (degenerative disc disease), lumbar    Prolapsed internal hemorrhoids, grade 3 09/25/2019   Meralgia paresthetica of left side 09/11/2019   Bipolar affective disorder, currently depressed, moderate (HCC) 09/11/2019   External hemorrhoids with complication 07/30/2019   Overactive bladder 06/11/2019   OSA (obstructive sleep apnea) 03/24/2019   Essential hypertension 03/24/2019   Seizure disorder (HCC)  03/24/2019    Past Surgical History:  Procedure Laterality Date   CYSTECTOMY     DENTAL SURGERY     teeth pulled   HERNIA REPAIR         Home Medications    Prior to Admission medications   Medication Sig Start Date End Date Taking? Authorizing Provider  cephALEXin (KEFLEX) 500 MG capsule Take 1 capsule (500 mg total) by mouth 3 (three) times daily. 08/26/21  Yes Ellsworth Lennox, PA-C  ibuprofen (ADVIL) 600 MG tablet Take 1 tablet (600 mg total) by mouth 3 (three) times daily. 08/26/21  Yes Ellsworth Lennox, PA-C  amLODipine (NORVASC) 5 MG tablet TAKE 1 TABLET(5 MG) BY MOUTH DAILY 06/13/21   Storm Frisk, MD  azelastine (ASTELIN) 0.1 % nasal spray Place 2 sprays into both nostrils 2 (two) times daily. Use in each nostril as directed 05/17/21   Storm Frisk, MD  B Complex-C-E-Zn (EQL STRESS B-COMPLEX C/ZINC) TABS One a day po. With food. 09/11/19   Dohmeier, Porfirio Mylar, MD  chlorhexidine (PERIDEX) 0.12 % solution Use as directed 15 mLs in the mouth or throat 2 (two) times daily. 06/21/21   Storm Frisk, MD  cholecalciferol (VITAMIN D3) 25 MCG (1000 UNIT) tablet Take 2,000 Units by mouth daily.    [provider]  Cyanocobalamin (VITAMIN B-12 PO) Take 1 tablet by mouth daily at  6 (six) AM.    [provider]  cyclobenzaprine (FLEXERIL) 10 MG tablet Take 1 tablet (10 mg total) by mouth 3 (three) times daily as needed for muscle spasms. 08/02/21   Mayers, Cari S, PA-C  divalproex (DEPAKOTE ER) 500 MG 24 hr tablet Take 1 tablet (500 mg total) by mouth in the morning and at bedtime. 06/13/21   Storm Frisk, MD  FLUoxetine (PROZAC) 20 MG capsule Take 1 capsule (20 mg total) by mouth daily. 06/13/21   Storm Frisk, MD  hydrOXYzine (VISTARIL) 25 MG capsule Take 1 capsule (25 mg total) by mouth every 8 (eight) hours as needed. 06/13/21   Storm Frisk, MD  lidocaine-prilocaine (EMLA) cream Apply 1 application topically as needed. 09/27/20   Storm Frisk, MD  loratadine  (CLARITIN) 10 MG tablet TAKE 1 TABLET(10 MG) BY MOUTH DAILY 02/18/21   Storm Frisk, MD  ondansetron (ZOFRAN-ODT) 4 MG disintegrating tablet Take 1 tablet (4 mg total) by mouth every 8 (eight) hours as needed for nausea or vomiting. 08/10/21   Rising, Lurena Joiner, PA-C  OXYTROL 3.9 MG/24HR Apply patch two times a week 06/13/21   Storm Frisk, MD  predniSONE (STERAPRED UNI-PAK 21 TAB) 10 MG (21) TBPK tablet Take by mouth daily. Take 6 tabs by mouth daily  for 2 days, then 5 tabs for 2 days, then 4 tabs for 2 days, then 3 tabs for 2 days, 2 tabs for 2 days, then 1 tab by mouth daily for 2 days 08/11/21   Barrie Dunker B, PA-C  QUEtiapine (SEROQUEL) 100 MG tablet Take 1 tablet (100 mg total) by mouth at bedtime. 06/13/21   Storm Frisk, MD  Vitamin D, Ergocalciferol, (DRISDOL) 1.25 MG (50000 UNIT) CAPS capsule Take 1 capsule (50,000 Units total) by mouth every 7 (seven) days. 06/14/21   Storm Frisk, MD    Family History Family History  Family history unknown: Yes    Social History Social History   Tobacco Use   Smoking status: Never   Smokeless tobacco: Never  Vaping Use   Vaping Use: Never used  Substance Use Topics   Alcohol use: Never   Drug use: Never     Allergies   Losartan   Review of Systems Review of Systems  HENT:  Positive for ear pain (left upper auricle).      Physical Exam Triage Vital Signs ED Triage Vitals  Enc Vitals Group     BP 08/26/21 1024 117/78     Pulse Rate 08/26/21 1024 78     Resp 08/26/21 1024 18     Temp 08/26/21 1024 98.7 F (37.1 C)     Temp src --      SpO2 08/26/21 1024 98 %     Weight --      Height --      Head Circumference --      Peak Flow --      Pain Score 08/26/21 1022 10     Pain Loc --      Pain Edu? --      Excl. in GC? --    No data found.  Updated Vital Signs BP 117/78   Pulse 78   Temp 98.7 F (37.1 C)   Resp 18   SpO2 98%   Visual Acuity Right Eye Distance:   Left Eye Distance:   Bilateral  Distance:    Right Eye Near:   Left Eye Near:    Bilateral Near:  Physical Exam Constitutional:      Appearance: Normal appearance.  HENT:     Right Ear: Tympanic membrane and ear canal normal.     Left Ear: Tympanic membrane and ear canal normal.     Mouth/Throat:     Mouth: Mucous membranes are moist.     Pharynx: Oropharynx is clear. Uvula midline. No pharyngeal swelling.  Cardiovascular:     Rate and Rhythm: Normal rate and regular rhythm.     Heart sounds: Normal heart sounds.  Pulmonary:     Effort: Pulmonary effort is normal.     Breath sounds: Normal breath sounds and air entry. No wheezing, rhonchi or rales.  Lymphadenopathy:     Cervical: No cervical adenopathy.  Skin:    Comments: Left ear auricle: There is mild redness and tenderness at the top of the auricle and just to the beginning of the inside at the anterior fold, there is mild redness and a forming acne type bumps that is present.  There is no drainage from the site, but there is pain on palpation of this area.  Neurological:     Mental Status: He is alert.      UC Treatments / Results  Labs (all labs ordered are listed, but only abnormal results are displayed) Labs Reviewed - No data to display  EKG   Radiology No results found.  Procedures Procedures (including critical care time)  Medications Ordered in UC Medications - No data to display  Initial Impression / Assessment and Plan / UC Course  I have reviewed the triage vital signs and the nursing notes.  Pertinent labs & imaging results that were available during my care of the patient were reviewed by me and considered in my medical decision making (see chart for details).    Plan: 1.  This is an early forming cellulitis-acne pimple like formation.  Advised to take Keflex 500 mg 1 3 times daily until area resolves. 2.  Advised take ibuprofen 600 mg 1 every 8 hours with food. 3.  Advised to follow-up PCP or return to urgent care if  symptoms fail to improve Final Clinical Impressions(s) / UC Diagnoses   Final diagnoses:  Acute otalgia, left  Cellulitis of antihelix of left ear     Discharge Instructions      Advised take ibuprofen 600 mg 1 every 8 hours with food to help decrease the left ear pain. Advised to take the Keflex 500 mg 1 every 8 hours to treat the early forming cellulitis of the left ear. Advised to follow-up with PCP or return to urgent care if symptoms fail to improve.    ED Prescriptions     Medication Sig Dispense Auth. Provider   ibuprofen (ADVIL) 600 MG tablet Take 1 tablet (600 mg total) by mouth 3 (three) times daily. 30 tablet Ellsworth Lennox, PA-C   cephALEXin (KEFLEX) 500 MG capsule Take 1 capsule (500 mg total) by mouth 3 (three) times daily. 20 capsule Ellsworth Lennox, PA-C      PDMP not reviewed this encounter.   Ellsworth Lennox, PA-C 08/26/21 1042

## 2021-08-26 NOTE — ED Triage Notes (Signed)
Pt reports external lt ear pain for one week.

## 2021-08-26 NOTE — Discharge Instructions (Signed)
Advised take ibuprofen 600 mg 1 every 8 hours with food to help decrease the left ear pain. Advised to take the Keflex 500 mg 1 every 8 hours to treat the early forming cellulitis of the left ear. Advised to follow-up with PCP or return to urgent care if symptoms fail to improve.

## 2021-09-01 ENCOUNTER — Ambulatory Visit: Payer: Self-pay

## 2021-09-01 ENCOUNTER — Emergency Department (HOSPITAL_COMMUNITY)
Admission: EM | Admit: 2021-09-01 | Discharge: 2021-09-01 | Disposition: A | Discharge status: Home / Self Care | Payer: Medicaid Other | Attending: Emergency Medicine | Admitting: Emergency Medicine

## 2021-09-01 ENCOUNTER — Encounter (HOSPITAL_COMMUNITY): Payer: Self-pay

## 2021-09-01 ENCOUNTER — Other Ambulatory Visit: Payer: Self-pay

## 2021-09-01 ENCOUNTER — Telehealth: Payer: Self-pay

## 2021-09-01 DIAGNOSIS — I10 Essential (primary) hypertension: Secondary | ICD-10-CM | POA: Insufficient documentation

## 2021-09-01 DIAGNOSIS — R339 Retention of urine, unspecified: Secondary | ICD-10-CM

## 2021-09-01 DIAGNOSIS — R140 Abdominal distension (gaseous): Secondary | ICD-10-CM | POA: Insufficient documentation

## 2021-09-01 DIAGNOSIS — K5900 Constipation, unspecified: Secondary | ICD-10-CM | POA: Diagnosis not present

## 2021-09-01 LAB — URINALYSIS, ROUTINE W REFLEX MICROSCOPIC
Bacteria, UA: NONE SEEN
Bilirubin Urine: NEGATIVE
Glucose, UA: NEGATIVE mg/dL
Ketones, ur: NEGATIVE mg/dL
Leukocytes,Ua: NEGATIVE
Nitrite: NEGATIVE
Protein, ur: NEGATIVE mg/dL
Specific Gravity, Urine: 1.002 — ABNORMAL LOW (ref 1.005–1.030)
pH: 7 (ref 5.0–8.0)

## 2021-09-01 LAB — CBC WITH DIFFERENTIAL/PLATELET
Abs Immature Granulocytes: 0.01 10*3/uL (ref 0.00–0.07)
Basophils Absolute: 0 10*3/uL (ref 0.0–0.1)
Basophils Relative: 0 %
Eosinophils Absolute: 0 10*3/uL (ref 0.0–0.5)
Eosinophils Relative: 1 %
HCT: 43.4 % (ref 39.0–52.0)
Hemoglobin: 14.5 g/dL (ref 13.0–17.0)
Immature Granulocytes: 0 %
Lymphocytes Relative: 32 %
Lymphs Abs: 1.6 10*3/uL (ref 0.7–4.0)
MCH: 28.5 pg (ref 26.0–34.0)
MCHC: 33.4 g/dL (ref 30.0–36.0)
MCV: 85.3 fL (ref 80.0–100.0)
Monocytes Absolute: 0.3 10*3/uL (ref 0.1–1.0)
Monocytes Relative: 6 %
Neutro Abs: 2.9 10*3/uL (ref 1.7–7.7)
Neutrophils Relative %: 61 %
Platelets: 230 10*3/uL (ref 150–400)
RBC: 5.09 MIL/uL (ref 4.22–5.81)
RDW: 15 % (ref 11.5–15.5)
WBC: 4.9 10*3/uL (ref 4.0–10.5)
nRBC: 0 % (ref 0.0–0.2)

## 2021-09-01 LAB — BASIC METABOLIC PANEL
Anion gap: 6 (ref 5–15)
BUN: 10 mg/dL (ref 6–20)
CO2: 27 mmol/L (ref 22–32)
Calcium: 9.3 mg/dL (ref 8.9–10.3)
Chloride: 107 mmol/L (ref 98–111)
Creatinine, Ser: 1.09 mg/dL (ref 0.61–1.24)
GFR, Estimated: >60 mL/min (ref >60)
Glucose, Bld: 95 mg/dL (ref 70–99)
Potassium: 3.7 mmol/L (ref 3.5–5.1)
Sodium: 140 mmol/L (ref 135–145)

## 2021-09-01 MED ORDER — POLYETHYLENE GLYCOL 3350 17 G PO PACK: 17.0000 g | PACK | Freq: Every day | ORAL | 0 refills | Status: DC

## 2021-09-01 NOTE — Discharge Instructions (Addendum)
You were seen today for abdominal tenderness, difficulty urinating, and fullness that appears to be related to developing acute urinary retention.  Your labs are reassuring but given your symptoms, you would likely benefit from a short course of outpatient catheterization.  We will place a Foley catheter today and recommend that you follow-up with a urologist using the above phone numbers within the next 14 days.  If you are unable to follow-up with your urologist in that timeframe, follow-up with your primary care provider.  If you are unable to see them then you should return to the emergency department for ongoing care management.

## 2021-09-01 NOTE — Telephone Encounter (Signed)
Routing to PCP

## 2021-09-01 NOTE — Telephone Encounter (Signed)
Patient called in asking if we had any catheter bags patient states the top at the bottom of bag keeps coming off. Asked patient did they give him or give him the address for extra supplies he stated no. A referral for Urology has also been placed.

## 2021-09-01 NOTE — ED Provider Notes (Signed)
[facility]-EMERGENCY DEPT Provider Note   CSN: [id number] Arrival date & time: 09/01/21  1006     History Chief Complaint  Patient presents with   Constipation    HPI Jeremiah Hunter is a 61 y.o. male presenting for abdominal pain.  Patient endorses feelings of pain when trying to go to the bathroom.  He has been having constant urinary dribbling and feeling that he cannot empty his bladder. He denies fevers chills nausea vomiting syncope or shortness of breath.  The pain has been intermittent in nature over the past 2 weeks with acute exacerbation this morning.  Sharp and periumbilical in nature.  No known sick contacts.  No history of similar.  He does have a distant history of hyperactive bladder that followed with urology though has been lost to follow-up.   Patient's recorded medical, surgical, social, medication list and allergies were reviewed in the Snapshot window as part of the initial history.   Review of Systems   Review of Systems  Constitutional:  Negative for chills and fever.  HENT:  Negative for ear pain and sore throat.   Eyes:  Negative for pain and visual disturbance.  Respiratory:  Negative for cough and shortness of breath.   Cardiovascular:  Negative for chest pain and palpitations.  Gastrointestinal:  Negative for abdominal pain and vomiting.  Genitourinary:  Positive for difficulty urinating. Negative for dysuria, flank pain and hematuria.  Musculoskeletal:  Positive for back pain and gait problem. Negative for arthralgias.  Skin:  Negative for color change and rash.  Neurological:  Negative for seizures and syncope.  All other systems reviewed and are negative.   Physical Exam Updated Vital Signs BP (!) 134/109 (BP Location: Right Arm)   Pulse (!) 102   Temp 98.2 F (36.8 C) (Oral)   Resp 20   Ht 5\' 11"  (1.803 m)   Wt 93 kg   SpO2 98%   BMI 28.60 kg/m  Physical Exam Vitals and nursing note reviewed.  Constitutional:       General: He is not in acute distress.    Appearance: He is well-developed.  HENT:     Head: Normocephalic and atraumatic.  Eyes:     Conjunctiva/sclera: Conjunctivae normal.  Cardiovascular:     Rate and Rhythm: Normal rate and regular rhythm.     Heart sounds: No murmur heard. Pulmonary:     Effort: Pulmonary effort is normal. No respiratory distress.     Breath sounds: Normal breath sounds.  Abdominal:     General: There is distension.     Palpations: Abdomen is soft.     Tenderness: There is abdominal tenderness (suprapubic mild tenderness and fullness). There is no guarding.  Musculoskeletal:        General: No swelling.     Cervical back: Neck supple.  Skin:    General: Skin is warm and dry.     Capillary Refill: Capillary refill takes less than 2 seconds.  Neurological:     Mental Status: He is alert.  Psychiatric:        Mood and Affect: Mood normal.      ED Course/ Medical Decision Making/ A&P    Procedures Procedures   Medications Ordered in ED Medications - No data to display  Medical Decision Making:    Jeremiah Hunter is a 61 y.o. male who presented to the ED today with abdominal fullness, inability to urinate detailed above.     External chart has been reviewed including  outpatient visit notes regarding patient's outpatient pain medication. Patient's presentation is complicated by their history of multiple comorbid medical problems including underlying muscle spasms limiting mobility, chronic illness including HTN, constipation, chronic pain.  Patient placed on continuous vitals and telemetry monitoring while in ED which was reviewed periodically.   Complete initial physical exam performed, notably the patient  was hemodynamically stable in no acute distress.  He has mild tenderness with palpation over his suprapubic space.  He feels mildly distended in his abdomen.  He is ambulatory tolerating p.o. intake in no acute distress at this time.      EMERGENCY  DEPARTMENT ULTRASOUND  Study: Limited Ultrasound of Bladder  INDICATIONS: to assess for urinary retention and/or bladder volume prior to urinary catheter Multiple views of the bladder were obtained in real-time in the transverse and longitudinal planes with a multi-frequency probe.  PERFORMED BY: Myself IMAGES ARCHIVED?: Yes LIMITATIONS: None INTERPRETATION: Large Volume and Volume Measurement 461 cc    Patient attempted voiding and repeat ultrasound demonstrated 275 cc in the bladder with continued sensation of needing to void but inability to.  Reviewed and confirmed nursing documentation for past medical history, family history, social history.    Initial Assessment:   With the patient's presentation of abdominal pain, most likely diagnosis is acute urinary retention likely secondary to developing BPH. Other diagnoses were considered including (but not limited to) appendicitis, cholecystitis, nephrolithiasis, nephropathy, pyelonephritis, urinary tract infection. These are considered less likely due to history of present illness and physical exam findings.   This is most consistent with an acute life/limb threatening illness complicated by underlying chronic conditions.  Initial Plan:  Screening labs including CBC and Metabolic panel to evaluate for infectious or metabolic etiology of disease.  Urinalysis with reflex culture ordered to evaluate for UTI or relevant urologic/nephrologic pathology.  Point-of-care bladder ultrasound demonstrating acute urinary retention.  Foley catheter placed with gross symptomatic improvement.  Initial Study Results:   Laboratory  All laboratory results reviewed without evidence of clinically relevant pathology.    Final Assessment and Plan:   Patient's history of present onset vitals and findings remain consistent with acute urinary tension.  Foley catheter placed the patient symptomatically improved.  He also feels is having difficulty with bowel  movements, will prescribe short course of MiraLAX in outpatient setting.  If patient instructions on outpatient follow-up with urology versus primary care and importance of returning if he is unable to obtain follow-up. Patient ambulatory tolerating p.o. intake in no acute distress at this time.  Disposition:  Based on the above findings, I believe patient is stable for discharge.    Patient/family educated about specific return precautions for given chief complaint and symptoms.  Patient/family educated about follow-up with PCP and urology.    Patient/family expressed understanding of return precautions and need for follow-up. Patient spoken to regarding all imaging and laboratory results and appropriate follow up for these results. All education provided in verbal form with additional information in written form. Time was allowed for answering of patient questions. Patient discharged.    Emergency Department Medication Summary:   Medications - No data to display       Clinical Impression:  1. Constipation, unspecified constipation type   2. Urinary retention      Discharge   Final Clinical Impression(s) / ED Diagnoses Final diagnoses:  Constipation, unspecified constipation type  Urinary retention    Rx / DC Orders ED Discharge Orders  Ordered    Ambulatory referral to Urology        09/01/21 1238    polyethylene glycol (MIRALAX) 17 g packet  Daily        09/01/21 1241              Glyn Ade, MD 09/01/21 1243

## 2021-09-01 NOTE — Telephone Encounter (Signed)
  Chief Complaint: Fecal impaction Symptoms: Cannot have a BM Frequency: this morning Pertinent Negatives: Patient denies any other issues Disposition: [x] ED /[] Urgent Care (no appt availability in office) / [] Appointment(In office/virtual)/ []  [facility]/ [] Home Care/ [] Refused Recommended Disposition /[] [facility]/ []  Follow-up with PCP Additional Notes: Pt request ems to come and get him. Pt states he had a BM this morning, but now needs to go and fecal matter is very hard and unable to pass. Also having trouble urinating. Pt states he has blurry vision. EMS called and are coming for pt.  Reason for Disposition  [1] Rectal pain or fullness from fecal impaction (rectum full of stool) AND [2] has not tried SITZ bath, suppository or enema  Answer Assessment - Initial Assessment Questions 1. STOOL PATTERN OR FREQUENCY: "How often do you have a bowel movement (BM)?"  (Normal range: 3 times a day to every 3 days)  "When was your last BM?"       Every morning 2. STRAINING: "Do you have to strain to have a BM?"      yes 3. RECTAL PAIN: "Does your rectum hurt when the stool comes out?" If Yes, ask: "Do you have hemorrhoids? How bad is the pain?"  (Scale 1-10; or mild, moderate, severe)      4. STOOL COMPOSITION: "Are the stools hard?"      yes 5. BLOOD ON STOOLS: "Has there been any blood on the toilet tissue or on the surface of the BM?" If Yes, ask: "When was the last time?"     no 6. CHRONIC CONSTIPATION: "Is this a new problem for you?"  If No, ask: "How long have you had this problem?" (days, weeks, months)      new 7. CHANGES IN DIET OR HYDRATION: "Have there been any recent changes in your diet?" "How much fluids are you drinking on a daily basis?"  "How much have you had to drink today?"      8. MEDICINES: "Have you been taking any new medicines?" "Are you taking any narcotic pain medicines?" (e.g., Dilaudid, morphine, Percocet, Vicodin)      9. LAXATIVES: "Have  you been using any stool softeners, laxatives, or enemas?"  If Yes, ask "What, how often, and when was the last time?"      10. ACTIVITY:  "How much walking do you do every day?"  "Has your activity level decreased in the past week?"         11. CAUSE: "What do you think is causing the constipation?"        Unsure 12. OTHER SYMPTOMS: "Do you have any other symptoms?" (e.g., abdomen pain, bloating, fever, vomiting)       Unable to urinate 13. MEDICAL HISTORY: "Do you have a history of hemorrhoids, rectal fissures, or rectal surgery or rectal abscess?"          14. PREGNANCY: "Is there any chance you are pregnant?" "When was your last menstrual period?"       na  Protocols used: Constipation-A-AH

## 2021-09-01 NOTE — ED Triage Notes (Signed)
Pt bib ems from home pt c/o constipation and retention x 2 hours.

## 2021-09-04 ENCOUNTER — Telehealth: Payer: Self-pay | Admitting: Emergency Medicine

## 2021-09-04 ENCOUNTER — Ambulatory Visit: Payer: Self-pay | Admitting: *Deleted

## 2021-09-04 ENCOUNTER — Emergency Department (HOSPITAL_COMMUNITY)
Admission: EM | Admit: 2021-09-04 | Discharge: 2021-09-04 | Disposition: A | Discharge status: Home / Self Care | Payer: Medicaid Other | Attending: Emergency Medicine | Admitting: Emergency Medicine

## 2021-09-04 ENCOUNTER — Ambulatory Visit: Payer: Self-pay

## 2021-09-04 ENCOUNTER — Encounter (HOSPITAL_COMMUNITY): Payer: Self-pay | Admitting: Emergency Medicine

## 2021-09-04 ENCOUNTER — Other Ambulatory Visit: Payer: Self-pay

## 2021-09-04 DIAGNOSIS — R319 Hematuria, unspecified: Secondary | ICD-10-CM | POA: Diagnosis present

## 2021-09-04 DIAGNOSIS — R339 Retention of urine, unspecified: Secondary | ICD-10-CM | POA: Insufficient documentation

## 2021-09-04 DIAGNOSIS — Z79899 Other long term (current) drug therapy: Secondary | ICD-10-CM | POA: Diagnosis not present

## 2021-09-04 DIAGNOSIS — N3001 Acute cystitis with hematuria: Secondary | ICD-10-CM | POA: Insufficient documentation

## 2021-09-04 LAB — CBC WITH DIFFERENTIAL/PLATELET
Abs Immature Granulocytes: 0.01 10*3/uL (ref 0.00–0.07)
Basophils Absolute: 0 10*3/uL (ref 0.0–0.1)
Basophils Relative: 0 %
Eosinophils Absolute: 0.1 10*3/uL (ref 0.0–0.5)
Eosinophils Relative: 1 %
HCT: 39.5 % (ref 39.0–52.0)
Hemoglobin: 13.3 g/dL (ref 13.0–17.0)
Immature Granulocytes: 0 %
Lymphocytes Relative: 27 %
Lymphs Abs: 1.8 10*3/uL (ref 0.7–4.0)
MCH: 28.7 pg (ref 26.0–34.0)
MCHC: 33.7 g/dL (ref 30.0–36.0)
MCV: 85.3 fL (ref 80.0–100.0)
Monocytes Absolute: 0.5 10*3/uL (ref 0.1–1.0)
Monocytes Relative: 7 %
Neutro Abs: 4.3 10*3/uL (ref 1.7–7.7)
Neutrophils Relative %: 65 %
Platelets: 231 10*3/uL (ref 150–400)
RBC: 4.63 MIL/uL (ref 4.22–5.81)
RDW: 15.2 % (ref 11.5–15.5)
WBC: 6.8 10*3/uL (ref 4.0–10.5)
nRBC: 0 % (ref 0.0–0.2)

## 2021-09-04 LAB — URINALYSIS, MICROSCOPIC (REFLEX)
RBC / HPF: >50 RBC/hpf (ref 0–5)
Squamous Epithelial / HPF: NONE SEEN (ref 0–5)

## 2021-09-04 LAB — COMPREHENSIVE METABOLIC PANEL
ALT: 11 U/L (ref 0–44)
AST: 19 U/L (ref 15–41)
Albumin: 3.7 g/dL (ref 3.5–5.0)
Alkaline Phosphatase: 70 U/L (ref 38–126)
Anion gap: 5 (ref 5–15)
BUN: 15 mg/dL (ref 6–20)
CO2: 26 mmol/L (ref 22–32)
Calcium: 8.9 mg/dL (ref 8.9–10.3)
Chloride: 104 mmol/L (ref 98–111)
Creatinine, Ser: 1.22 mg/dL (ref 0.61–1.24)
GFR, Estimated: >60 mL/min (ref >60)
Glucose, Bld: 141 mg/dL — ABNORMAL HIGH (ref 70–99)
Potassium: 3.4 mmol/L — ABNORMAL LOW (ref 3.5–5.1)
Sodium: 135 mmol/L (ref 135–145)
Total Bilirubin: 0.6 mg/dL (ref 0.3–1.2)
Total Protein: 6.8 g/dL (ref 6.5–8.1)

## 2021-09-04 LAB — URINALYSIS, ROUTINE W REFLEX MICROSCOPIC
Glucose, UA: NEGATIVE mg/dL
Ketones, ur: NEGATIVE mg/dL
Nitrite: POSITIVE — AB
Protein, ur: >300 mg/dL — AB
Specific Gravity, Urine: >1.03 — ABNORMAL HIGH (ref 1.005–1.030)
pH: 5.5 (ref 5.0–8.0)

## 2021-09-04 MED ORDER — CEPHALEXIN 500 MG PO CAPS: 500.0000 mg | ORAL_CAPSULE | Freq: Four times a day (QID) | ORAL | 0 refills | Status: DC

## 2021-09-04 NOTE — ED Provider Notes (Signed)
[facility]-EMERGENCY DEPT Provider Note   CSN: [id number] Arrival date & time: 09/04/21  1524     History  Chief Complaint  Patient presents with   Foley issue   Hematuria    Jeremiah Hunter is a 61 y.o. male who presents to the emergency department concerns for Foley issue onset today.  He had a Foley placed on 09/01/2021 due to pain with urination and dribbling with urination.  Notes he has a follow-up appointment with [facility] on 09/11/2021.  Has associated hematuria which prompted his ED visit.  No meds tried prior to arrival.  Denies fever, abdominal pain, nausea, vomiting.     The history is provided by the patient. No language interpreter was used.       Home Medications Prior to Admission medications   Medication Sig Start Date End Date Taking? Authorizing Provider  amLODipine (NORVASC) 5 MG tablet TAKE 1 TABLET(5 MG) BY MOUTH DAILY 06/13/21   Storm Frisk, MD  azelastine (ASTELIN) 0.1 % nasal spray Place 2 sprays into both nostrils 2 (two) times daily. Use in each nostril as directed 05/17/21   Storm Frisk, MD  B Complex-C-E-Zn (EQL STRESS B-COMPLEX C/ZINC) TABS One a day po. With food. 09/11/19   Dohmeier, Porfirio Mylar, MD  cephALEXin (KEFLEX) 500 MG capsule Take 1 capsule (500 mg total) by mouth 4 (four) times daily for 7 days. 09/04/21 09/11/21  Aarna Mihalko A, PA-C  chlorhexidine (PERIDEX) 0.12 % solution Use as directed 15 mLs in the mouth or throat 2 (two) times daily. 06/21/21   Storm Frisk, MD  cholecalciferol (VITAMIN D3) 25 MCG (1000 UNIT) tablet Take 2,000 Units by mouth daily.    [provider]  Cyanocobalamin (VITAMIN B-12 PO) Take 1 tablet by mouth daily at 6 (six) AM.    [provider]  cyclobenzaprine (FLEXERIL) 10 MG tablet Take 1 tablet (10 mg total) by mouth 3 (three) times daily as needed for muscle spasms. 08/02/21   Mayers, Cari S, PA-C  divalproex (DEPAKOTE ER) 500 MG 24 hr tablet Take 1 tablet (500  mg total) by mouth in the morning and at bedtime. 06/13/21   Storm Frisk, MD  FLUoxetine (PROZAC) 20 MG capsule Take 1 capsule (20 mg total) by mouth daily. 06/13/21   Storm Frisk, MD  hydrOXYzine (VISTARIL) 25 MG capsule Take 1 capsule (25 mg total) by mouth every 8 (eight) hours as needed. 06/13/21   Storm Frisk, MD  ibuprofen (ADVIL) 600 MG tablet Take 1 tablet (600 mg total) by mouth 3 (three) times daily. 08/26/21   Ellsworth Lennox, PA-C  lidocaine-prilocaine (EMLA) cream Apply 1 application topically as needed. 09/27/20   Storm Frisk, MD  loratadine (CLARITIN) 10 MG tablet TAKE 1 TABLET(10 MG) BY MOUTH DAILY 02/18/21   Storm Frisk, MD  ondansetron (ZOFRAN-ODT) 4 MG disintegrating tablet Take 1 tablet (4 mg total) by mouth every 8 (eight) hours as needed for nausea or vomiting. 08/10/21   Rising, Lurena Joiner, PA-C  OXYTROL 3.9 MG/24HR Apply patch two times a week 06/13/21   Storm Frisk, MD  polyethylene glycol (MIRALAX) 17 g packet Take 17 g by mouth daily. 09/01/21   Glyn Ade, MD  predniSONE (STERAPRED UNI-PAK 21 TAB) 10 MG (21) TBPK tablet Take by mouth daily. Take 6 tabs by mouth daily  for 2 days, then 5 tabs for 2 days, then 4 tabs for 2 days, then 3 tabs for 2 days, 2 tabs  for 2 days, then 1 tab by mouth daily for 2 days 08/11/21   Darrick Grinder, PA-C  QUEtiapine (SEROQUEL) 100 MG tablet Take 1 tablet (100 mg total) by mouth at bedtime. 06/13/21   Storm Frisk, MD  Vitamin D, Ergocalciferol, (DRISDOL) 1.25 MG (50000 UNIT) CAPS capsule Take 1 capsule (50,000 Units total) by mouth every 7 (seven) days. 06/14/21   Storm Frisk, MD      Allergies    Losartan    Review of Systems   Review of Systems  Constitutional:  Negative for fever.  Gastrointestinal:  Negative for abdominal pain, nausea and vomiting.  Genitourinary:  Positive for dysuria and hematuria.  All other systems reviewed and are negative.   Physical Exam Updated Vital Signs BP  116/83   Pulse 66   Temp 98.7 F (37.1 C) (Oral)   Resp 18   Ht 5\' 11"  (1.803 m)   Wt 90.7 kg   SpO2 100%   BMI 27.89 kg/m  Physical Exam Vitals and nursing note reviewed. Exam conducted with a chaperone present.  Constitutional:      General: He is not in acute distress.    Appearance: He is not diaphoretic.  HENT:     Head: Normocephalic and atraumatic.     Mouth/Throat:     Pharynx: No oropharyngeal exudate.  Eyes:     General: No scleral icterus.    Conjunctiva/sclera: Conjunctivae normal.  Cardiovascular:     Rate and Rhythm: Normal rate and regular rhythm.     Pulses: Normal pulses.     Heart sounds: Normal heart sounds.  Pulmonary:     Effort: Pulmonary effort is normal. No respiratory distress.     Breath sounds: Normal breath sounds. No wheezing.  Abdominal:     General: Bowel sounds are normal.     Palpations: Abdomen is soft. There is no mass.     Tenderness: There is no abdominal tenderness. There is no guarding or rebound.  Genitourinary:    Penis: Normal and circumcised. No erythema, tenderness, discharge or swelling.      Comments: RN chaperone present for exam.  Normal, circumcised penis without lesions or discharge or erythema noted to the area. Musculoskeletal:        General: Normal range of motion.     Cervical back: Normal range of motion and neck supple.  Skin:    General: Skin is warm and dry.  Neurological:     Mental Status: He is alert.  Psychiatric:        Behavior: Behavior normal.     ED Results / Procedures / Treatments   Labs (all labs ordered are listed, but only abnormal results are displayed) Labs Reviewed  COMPREHENSIVE METABOLIC PANEL - Abnormal; Notable for the following components:      Result Value   Potassium 3.4 (*)    Glucose, Bld 141 (*)    All other components within normal limits  URINALYSIS, ROUTINE W REFLEX MICROSCOPIC - Abnormal; Notable for the following components:   Specific Gravity, Urine >1.030 (*)    Hgb  urine dipstick LARGE (*)    Bilirubin Urine SMALL (*)    Protein, ur >300 (*)    Nitrite POSITIVE (*)    Leukocytes,Ua MODERATE (*)    All other components within normal limits  URINALYSIS, MICROSCOPIC (REFLEX) - Abnormal; Notable for the following components:   Bacteria, UA FEW (*)    All other components within normal limits  URINE CULTURE  CBC WITH DIFFERENTIAL/PLATELET    EKG None  Radiology No results found.  Procedures Procedures    Medications Ordered in ED Medications - No data to display  ED Course/ Medical Decision Making/ A&P Clinical Course as of 09/04/21 2222  Mon Sep 04, 2021  2023 Bladder scan noted to be at 0. Discussed with patient treatment plan.  [SB]    Clinical Course User Index [SB] Kruze Atchley A, PA-C                           Medical Decision Making Amount and/or Complexity of Data Reviewed Labs: ordered.  Risk Prescription drug management.   Pt presents with concerns for issues with his Foley and hematuria onset today.  No abdominal pain, nausea, vomiting, fever.  Patient afebrile.  On exam patient with no acute cardiovascular, respiratory, down exam findings.  RN chaperone present for GU exam.  Normal circumcised penis without lesions or discharge or erythema noted to the area.  Differential diagnosis includes acute cystitis, obstruction, kidney stone.   Additional history obtained:  External records from outside source obtained and reviewed including: Patient was evaluated in the ED on 09/01/2021 where he had a Foley catheter placed at that time.  Patient was also instructed to follow-up with urology within the next 10 days.  Labs:  I ordered, and personally interpreted labs.  The pertinent results include:   Urinalysis notable for nitrates and leukocytes, large amount of hemoglobin.  Urine culture ordered results pending at time of discharge. CMP with slightly decreased potassium at 3.4 otherwise unremarkable. CBC  unremarkable   Disposition: Presentation suspicious for acute cystitis with hematuria.  Bladder scan at 0 in the emergency department, doubt obstruction at this time.  Doubt kidney stone at this time.  Foley catheter easily flushed in the emergency department by RN.  Foley catheter without issues noted on exam, urine noted in Foley bag throughout ED visit.  After consideration of the diagnostic results and the patients response to treatment, I feel that the patient would benefit from Discharge home.  Patient will be discharged home with a prescription for Keflex.  Supportive care measures and strict return precautions discussed with patient at bedside. Pt acknowledges and verbalizes understanding. Pt appears safe for discharge. Follow up as indicated in discharge paperwork.    This chart was dictated using voice recognition software, Dragon. Despite the best efforts of this provider to proofread and correct errors, errors may still occur which can change documentation meaning.   Final Clinical Impression(s) / ED Diagnoses Final diagnoses:  Acute cystitis with hematuria    Rx / DC Orders ED Discharge Orders          Ordered    cephALEXin (KEFLEX) 500 MG capsule  4 times daily        09/04/21 2138              Edison Wollschlager A, PA-C 09/04/21 2222    Terrilee Files, MD 09/05/21 1113

## 2021-09-04 NOTE — Discharge Instructions (Addendum)
It was a pleasure taking care of you today!   Your labs were unremarkable. Your urine was notable for a UTI. You will be sent a prescription for Keflex, take as prescribed. Ensure that you complete the entire course of antibiotic.  You have a urine culture ordered with results pending at your discharge.  Ensure to maintain fluid intake.  You may follow-up with your primary care provider as needed. Maintain your follow up appointment with the urologist on 09/21/21. Return to the emergency department if you are experiencing increasing or worsening abdominal pain, urinary symptoms, fever, or decreased fluid intake.

## 2021-09-04 NOTE — ED Notes (Signed)
Pt has been able to void and leg bag is full of urine.

## 2021-09-04 NOTE — Telephone Encounter (Signed)
Copied from CRM [id number]. Topic: Appointment Scheduling - Scheduling Inquiry for Clinic >> Sep 04, 2021  8:06 AM Tiffany B wrote: Reason for CRM: Patient would like to schedule a hospital follow up, stool blood test overdue since 06/30/2021, patient has a urine bag and would like a referral to urologist, patient would like PCP nurse to review hospital lab results/

## 2021-09-04 NOTE — Telephone Encounter (Signed)
Reason for Disposition  Passing pure blood or large blood clots (i.e., size > a dime)  (Exception: Fleck or small strands.)    Has a urinary catheter in and the urine is bloody in the bag.  Answer Assessment - Initial Assessment Questions 1. COLOR of URINE: "Describe the color of the urine."  (e.g., tea-colored, pink, red, bloody) "Do you have blood clots in your urine?" (e.g., none, pea, grape, small coin)     I have a urine bag since Friday.   I felt pain.   I have blood in the pee in my bag.   The nurse never called me back.   2. ONSET: "When did the bleeding start?"      It's in my penis.   I have a catheter they put in me.   3. EPISODES: "How many times has there been blood in the urine?" or "How many times today?"     Today since an hour ago. 4. PAIN with URINATION: "Is there any pain with passing your urine?" If Yes, ask: "How bad is the pain?"  (Scale 1-10; or mild, moderate, severe)    - MILD: Complains slightly about urination hurting.    - MODERATE: Interferes with normal activities.      - SEVERE: Excruciating, unwilling or unable to urinate because of the pain.      I have this catheter in my penis they put in last Friday. 5. FEVER: "Do you have a fever?" If Yes, ask: "What is your temperature, how was it measured, and when did it start?"     Not asked 6. ASSOCIATED SYMPTOMS: "Are you passing urine more frequently than usual?"     Pain in his penis 7. OTHER SYMPTOMS: "Do you have any other symptoms?" (e.g., back/flank pain, abdomen pain, vomiting)     Bloody urine in bag over the last hour. 8. PREGNANCY: "Is there any chance you are pregnant?" "When was your last menstrual period?"     N/A  Protocols used: Urine - Blood In-A-AH

## 2021-09-04 NOTE — Telephone Encounter (Signed)
Duplicate message,the same message was sent to provider already

## 2021-09-04 NOTE — Telephone Encounter (Signed)
Pt was in ED with urinary retention needs foley catheter drainage bag  Erskine Squibb can you help?  I can place a misc order

## 2021-09-04 NOTE — Telephone Encounter (Signed)
I called the patient to confirm that he has catheter bags and he said yes he did and he is currently at the ED because of blood in his urine.

## 2021-09-04 NOTE — ED Notes (Signed)
Received verbal order to flush the patient's foley catheter. This RN flushed 50cc without difficulty but was only able to pull back 30cc. [place], PA informed.

## 2021-09-04 NOTE — Telephone Encounter (Signed)
Look  to previous message

## 2021-09-04 NOTE — Telephone Encounter (Signed)
Called patient, to follow up about recent request  about appt. And referral and about catheter bags. When asked twice, patient stated that he was fine and didn't need anything. Follow up appointment has been made and he has referred to urology  already.

## 2021-09-04 NOTE — ED Provider Triage Note (Signed)
Emergency Medicine Provider Triage Evaluation Note  Jeremiah Hunter , a 61 y.o. male  was evaluated in triage.  Pt complains of blood in his Foley bag and penile pain.  Patient was seen here on 09/01/2021 and had the Foley catheter put in for acute urinary retention.  Patient was doing fine until today when he noticed that there was some pain in his penis and he saw the bag that there was some blood in it.  The pain has gone steadily worse and he has increased urinary frequency and pain during urination.  Review of Systems  Positive:  Negative:   Physical Exam  BP 118/74   Pulse (!) 102   Temp 98.9 F (37.2 C) (Oral)   Resp 18   Ht 5\' 11"  (1.803 m)   Wt 90.7 kg   SpO2 98%   BMI 27.89 kg/m  Gen:   Awake, no distress   Resp:  Normal effort  MSK:   Moves extremities without difficulty  Other:  Blood-tinged urine noted in Foley bag  Medical Decision Making  Medically screening exam initiated at 3:46 PM.  Appropriate orders placed.  Vandy Tsuchiya was informed that the remainder of the evaluation will be completed by another provider, this initial triage assessment does not replace that evaluation, and the importance of remaining in the ED until their evaluation is complete.     Mingo Amber, Janell Quiet 09/04/21 1548

## 2021-09-04 NOTE — Telephone Encounter (Signed)
FYI

## 2021-09-04 NOTE — Telephone Encounter (Signed)
Summary: blood in urine bag   Patient called in says wears bag for urine has blood in urine in the bag.,just noticed today     Called pt back. Pt states that he is going to the ED to be seen because the pain has gone higher.

## 2021-09-04 NOTE — ED Notes (Signed)
Pt A&OX4 ambulatory at d/c with independent steady gait. Pt verbalized understanding of d/c instructions, prescription and follow up care. 

## 2021-09-04 NOTE — ED Triage Notes (Signed)
Pt reports blood in urine in foley bag. Pt reports he is in a lot of pain as well. 10/10.

## 2021-09-04 NOTE — Telephone Encounter (Signed)
  Chief Complaint: urine in catheter bag has blood in it and having pain  in his penis  Symptoms: above Frequency: Now for the past hour Pertinent Negatives: Patient denies N/A Disposition: [x] ED /[] Urgent Care (no appt availability in office) / [] Appointment(In office/virtual)/ []  [facility]/ [] Home Care/ [] Refused Recommended Disposition /[] [place] [facility]/ []  Follow-up with PCP Additional Notes: I have instructed him to go to the ED.   "My nephew is on the way now to take me".   I sent this information to [facility].

## 2021-09-04 NOTE — Addendum Note (Signed)
Addended by: Storm Frisk on: 09/04/2021 08:24 AM   Modules accepted: Orders

## 2021-09-05 ENCOUNTER — Other Ambulatory Visit: Payer: Self-pay

## 2021-09-05 ENCOUNTER — Telehealth: Payer: Self-pay

## 2021-09-05 NOTE — Telephone Encounter (Signed)
Oxytrol PA approved until 09/04/21

## 2021-09-05 NOTE — Telephone Encounter (Signed)
Noted  

## 2021-09-07 LAB — URINE CULTURE: Culture: >=100000 — AB

## 2021-09-08 ENCOUNTER — Telehealth: Payer: Self-pay | Admitting: *Deleted

## 2021-09-08 ENCOUNTER — Encounter: Payer: Self-pay | Admitting: Podiatry

## 2021-09-08 ENCOUNTER — Ambulatory Visit: Payer: Self-pay | Admitting: *Deleted

## 2021-09-08 ENCOUNTER — Ambulatory Visit (INDEPENDENT_AMBULATORY_CARE_PROVIDER_SITE_OTHER): Payer: Medicaid Other | Admitting: Podiatry

## 2021-09-08 DIAGNOSIS — M7671 Peroneal tendinitis, right leg: Secondary | ICD-10-CM | POA: Diagnosis not present

## 2021-09-08 DIAGNOSIS — B351 Tinea unguium: Secondary | ICD-10-CM | POA: Diagnosis not present

## 2021-09-08 MED ORDER — TRIAMCINOLONE ACETONIDE 10 MG/ML IJ SUSP
10.0000 mg | Freq: Once | INTRAMUSCULAR | Status: AC
  Administered 2021-09-08: 10 mg

## 2021-09-08 NOTE — Telephone Encounter (Signed)
Post ED Visit - Positive Culture Follow-up: Successful Patient Follow-Up  Culture assessed and recommendations reviewed by:  []  , Pharm.D. []  Enzo Bi, Pharm.D., BCPS AQ-ID []  , Pharm.D., BCPS []  Celedonio Miyamoto, .D., BCPS []  [place], .D., BCPS, AAHIVP []  Georgina Pillion, Pharm.D., BCPS, AAHIVP []  [address], PharmD, BCPS []  , PharmD, BCPS []  [place], PharmD, BCPS []  [address], PharmD  Positive urine culture  []  Patient discharged without antimicrobial prescription and treatment is now indicated [x]  Organism is resistant to prescribed ED discharge antimicrobial []  Patient with positive blood cultures  Changes discussed with ED provider: , MD New antibiotic prescription Stop Cephalexin, start Omnicef 300mg  PO BID x 7 days Called to Est[facility]k. [phone]  Contacted patient, date 09/08/2021, time 1245   09/08/2021, 12:45 PM

## 2021-09-08 NOTE — Telephone Encounter (Signed)
Summary: medication reaction   Pt states he is having aches and stinging from his rt elbow down to his hand   Pt states he thinks symptoms are due to cephalexin 500 mg   Please fu w/ pt      Reason for Disposition  Weakness (i.e., loss of strength) in hand or fingers  (Exception: Not truly weak; hand feels weak because of pain.)  Answer Assessment - Initial Assessment Questions 1. ONSET: "When did the pain start?"     This week 2. LOCATION: "Where is the pain located?"     Right forearm down to the hand 3. PAIN: "How bad is the pain?" (Scale 1-10; or mild, moderate, severe)   - MILD (1-3): Doesn't interfere with normal activities.   - MODERATE (4-7): Interferes with normal activities (e.g., work or school) or awakens from sleep.   - SEVERE (8-10): Excruciating pain, unable to do any normal activities, unable to hold a cup of water.     Moderate/severe 4. WORK OR EXERCISE: "Has there been any recent work or exercise that involved this part of the body?"     no 5. CAUSE: "What do you think is causing the arm pain?"     unsure 6. OTHER SYMPTOMS: "Do you have any other symptoms?" (e.g., neck pain, swelling, rash, fever, numbness, weakness)     weakness  Protocols used: Arm Pain-A-AH

## 2021-09-08 NOTE — Progress Notes (Signed)
ED Antimicrobial Stewardship Positive Culture Follow Up   Jeremiah Hunter is an 61 y.o. male who presented to [facility] on 09/04/2021 with a chief complaint of  Chief Complaint  Patient presents with   Foley issue   Hematuria    Recent Results (from the past 720 hour(s))  SARS CORONAVIRUS 2 (TAT 6-24 HRS) Anterior Nasal Swab     Status: None   Collection Time: 08/10/21  8:13 AM   Specimen: Anterior Nasal Swab  Result Value Ref Range Status   SARS Coronavirus 2 NEGATIVE NEGATIVE Final    Comment: (NOTE) SARS-CoV-2 target nucleic acids are NOT DETECTED.  The SARS-CoV-2 RNA is generally detectable in upper and lower respiratory specimens during the acute phase of infection. Negative results do not preclude SARS-CoV-2 infection, do not rule out co-infections with other pathogens, and should not be used as the sole basis for treatment or other patient management decisions. Negative results must be combined with clinical observations, patient history, and epidemiological information. The expected result is Negative.  Fact Sheet for Patients: HairSlick.no  Fact Sheet for Healthcare Providers: quierodirigir.com  This test is not yet approved or cleared by the [place] FDA and  has been authorized for detection and/or diagnosis of SARS-CoV-2 by FDA under an Emergency Use Authorization (EUA). This EUA will remain  in effect (meaning this test can be used) for the duration of the COVID-19 declaration under Se ction 564(b)(1) of the Act, 21 U.S.C. section 360bbb-3(b)(1), unless the authorization is terminated or revoked sooner.  Performed at [facility], [address] [place], [place] [address]   Urine Culture     Status: Abnormal   Collection Time: 09/04/21  6:31 PM   Specimen: Urine, Clean Catch  Result Value Ref Range Status   Specimen Description   Final    URINE, CLEAN CATCH Performed at [facility], [address], [place], [place] [address]    Special Requests   Final    NONE Performed at [facility], [address], [place], [place] [address]    Culture >=100,000 COLONIES/mL SERRATIA MARCESCENS (A)  Final   Report Status 09/07/2021 FINAL  Final   Organism ID, Bacteria SERRATIA MARCESCENS (A)  Final      Susceptibility   Serratia marcescens - MIC*    CEFAZOLIN >=64 RESISTANT Resistant     CEFEPIME <=0.12 SENSITIVE Sensitive     CEFTRIAXONE <=0.25 SENSITIVE Sensitive     CIPROFLOXACIN <=0.25 SENSITIVE Sensitive     GENTAMICIN <=1 SENSITIVE Sensitive     NITROFURANTOIN 256 RESISTANT Resistant     TRIMETH/SULFA <=20 SENSITIVE Sensitive     * >=100,000 COLONIES/mL SERRATIA MARCESCENS    [x]  Treated with cephalexin, organism resistant to prescribed antimicrobial []  Patient discharged originally without antimicrobial agent and treatment is now indicated  New antibiotic prescription:  - Omnicef 300 mg PO BID x 7 days   ED Provider: , MD   , PharmD, BCPS 09/08/2021 12:17 PM

## 2021-09-08 NOTE — Telephone Encounter (Signed)
  Chief Complaint: R arm pain Symptoms: R arm pain and weakness Frequency: few days Pertinent Negatives: Patient denies fever, swelling Disposition: [] ED /[x] Urgent Care (no appt availability in office) / [] Appointment(In office/virtual)/ []  [place]/ [] Home Care/ [] Refused Recommended Disposition /[]  Mobile Bus/ []  Follow-up with PCP Additional Notes: call late in day- office closing soon- advised UC for evaluation of arm

## 2021-09-09 ENCOUNTER — Encounter (HOSPITAL_COMMUNITY): Payer: Self-pay

## 2021-09-09 ENCOUNTER — Ambulatory Visit (INDEPENDENT_AMBULATORY_CARE_PROVIDER_SITE_OTHER): Payer: Medicaid Other

## 2021-09-09 ENCOUNTER — Ambulatory Visit (HOSPITAL_COMMUNITY)
Admission: EM | Admit: 2021-09-09 | Discharge: 2021-09-09 | Disposition: A | Discharge status: Home / Self Care | Payer: Medicaid Other

## 2021-09-09 DIAGNOSIS — M4722 Other spondylosis with radiculopathy, cervical region: Secondary | ICD-10-CM

## 2021-09-09 DIAGNOSIS — M542 Cervicalgia: Secondary | ICD-10-CM

## 2021-09-09 MED ORDER — METHYLPREDNISOLONE 4 MG PO TBPK: ORAL_TABLET | ORAL | 0 refills | Status: DC

## 2021-09-09 NOTE — Discharge Instructions (Addendum)
The x-ray of your neck does show narrowing of the spinal column at the level of C5-C6 and C6-C7, essentially the base of your neck.  This is also the area that affects the nerves that go out to your arms which is why you are having difficulty with pain, numbness, weakness in your right arm and hand.  I recommend that you begin a tapering dose of the steroid today to see if we can reduce some of the inflammation that is causing your nerves to act up at this time.  Please take 1 row of tablets daily with your morning meal.  Please follow-up with the doctor that is currently caring for your lower back pain regarding your new findings of stenosis in your neck.  Thank you for visiting urgent care today.

## 2021-09-09 NOTE — ED Triage Notes (Signed)
On and off pain in the right arm. States her feels the pain more when letting the arm hand. Pain in the right shoulder, tingling and numbness in the hand. Patient also having weakness in the right hand when trying to grab/hold things. States his hand will let go of and drop things.

## 2021-09-09 NOTE — ED Provider Notes (Signed)
[facility]    CSN: [id number] Arrival date & time: 09/09/21  1003    HISTORY   Chief Complaint  Patient presents with   Arm Pain   HPI Jeremiah Hunter is a pleasant, 61 y.o. male who presents to urgent care today. Patient complains of intermittent right arm pain.  Also complains of pain in the right shoulder, tingling and numbness in the hand. Patient also having weakness in the right hand when trying to grab/hold things. States his hand will let go of and drop things.  Patient reports a history of lower back pain, states he currently has a urinary catheter in place because he has difficulty going to the bathroom.  EMR reviewed, patient has a history of lumbar spinal stenosis with radiculopathy.  The history is provided by the patient.   Past Medical History:  Diagnosis Date   Acute bilateral low back pain without sciatica 07/30/2019   Allergy    on meds   Anxiety    on meds   DDD (degenerative disc disease), lumbar    on meds   Enlarged prostate    GERD (gastroesophageal reflux disease)    on meds   Hypertension    on meds   Severe episode of recurrent major depressive disorder, with psychotic features (HCC) 03/24/2019   on meds   Sleep apnea    uses CPAP   Patient Active Problem List   Diagnosis Date Noted   Urinary retention 09/04/2021   Leg cramps 06/29/2021   Aphthous ulcer 06/21/2021   Vitamin D deficiency 06/13/2021   Pain due to onychomycosis of toenails of both feet 03/13/2021   Itching 01/26/2021   Frequent falls 12/08/2020   Lumbar foraminal stenosis 12/07/2020   DDD (degenerative disc disease), lumbar    Prolapsed internal hemorrhoids, grade 3 09/25/2019   Meralgia paresthetica of left side 09/11/2019   Bipolar affective disorder, currently depressed, moderate (HCC) 09/11/2019   External hemorrhoids with complication 07/30/2019   Overactive bladder 06/11/2019   OSA (obstructive sleep apnea) 03/24/2019   Essential hypertension 03/24/2019    Seizure disorder (HCC) 03/24/2019   Past Surgical History:  Procedure Laterality Date   CYSTECTOMY     DENTAL SURGERY     teeth pulled   HERNIA REPAIR      Home Medications    Prior to Admission medications   Medication Sig Start Date End Date Taking? Authorizing Provider  amLODipine (NORVASC) 5 MG tablet TAKE 1 TABLET(5 MG) BY MOUTH DAILY 06/13/21   Storm FriskWright, Patrick E, MD  azelastine (ASTELIN) 0.1 % nasal spray Place 2 sprays into both nostrils 2 (two) times daily. Use in each nostril as directed 05/17/21   Storm FriskWright, Patrick E, MD  B Complex-C-E-Zn (EQL STRESS B-COMPLEX C/ZINC) TABS One a day po. With food. 09/11/19   Dohmeier, Porfirio Mylararmen, MD  cephALEXin (KEFLEX) 500 MG capsule Take 1 capsule (500 mg total) by mouth 4 (four) times daily for 7 days. 09/04/21 09/11/21  Blue, Soijett A, PA-C  chlorhexidine (PERIDEX) 0.12 % solution Use as directed 15 mLs in the mouth or throat 2 (two) times daily. 06/21/21   Storm FriskWright, Patrick E, MD  Cyanocobalamin (VITAMIN B-12 PO) Take 1 tablet by mouth daily at 6 (six) AM.    [provider]  cyclobenzaprine (FLEXERIL) 10 MG tablet Take 1 tablet (10 mg total) by mouth 3 (three) times daily as needed for muscle spasms. 08/02/21   Mayers, Cari S, PA-C  divalproex (DEPAKOTE ER) 500 MG 24 hr tablet Take  1 tablet (500 mg total) by mouth in the morning and at bedtime. 06/13/21   Storm Frisk, MD  FLUoxetine (PROZAC) 20 MG capsule Take 1 capsule (20 mg total) by mouth daily. 06/13/21   Storm Frisk, MD  hydrOXYzine (VISTARIL) 25 MG capsule Take 1 capsule (25 mg total) by mouth every 8 (eight) hours as needed. 06/13/21   Storm Frisk, MD  ibuprofen (ADVIL) 600 MG tablet Take 1 tablet (600 mg total) by mouth 3 (three) times daily. 08/26/21   Ellsworth Lennox, PA-C  lidocaine-prilocaine (EMLA) cream Apply 1 application topically as needed. 09/27/20   Storm Frisk, MD  loratadine (CLARITIN) 10 MG tablet TAKE 1 TABLET(10 MG) BY MOUTH DAILY 02/18/21   Storm Frisk, MD  ondansetron (ZOFRAN-ODT) 4 MG disintegrating tablet Take 1 tablet (4 mg total) by mouth every 8 (eight) hours as needed for nausea or vomiting. 08/10/21   Rising, Lurena Joiner, PA-C  OXYTROL 3.9 MG/24HR Apply patch two times a week 06/13/21   Storm Frisk, MD  polyethylene glycol (MIRALAX) 17 g packet Take 17 g by mouth daily. 09/01/21   Glyn Ade, MD  predniSONE (STERAPRED UNI-PAK 21 TAB) 10 MG (21) TBPK tablet Take by mouth daily. Take 6 tabs by mouth daily  for 2 days, then 5 tabs for 2 days, then 4 tabs for 2 days, then 3 tabs for 2 days, 2 tabs for 2 days, then 1 tab by mouth daily for 2 days 08/11/21   Barrie Dunker B, PA-C  QUEtiapine (SEROQUEL) 100 MG tablet Take 1 tablet (100 mg total) by mouth at bedtime. 06/13/21   Storm Frisk, MD  Vitamin D, Ergocalciferol, (DRISDOL) 1.25 MG (50000 UNIT) CAPS capsule Take 1 capsule (50,000 Units total) by mouth every 7 (seven) days. 06/14/21   Storm Frisk, MD    Family History Family History  Family history unknown: Yes   Social History Social History   Tobacco Use   Smoking status: Never   Smokeless tobacco: Never  Vaping Use   Vaping Use: Never used  Substance Use Topics   Alcohol use: Never   Drug use: Never   Allergies   Losartan  Review of Systems Review of Systems Pertinent findings revealed after performing a 14 point review of systems has been noted in the history of present illness.  Physical Exam Triage Vital Signs ED Triage Vitals  Enc Vitals Group     BP 11/18/20 0827 (!) 147/82     Pulse Rate 11/18/20 0827 72     Resp 11/18/20 0827 18     Temp 11/18/20 0827 98.3 F (36.8 C)     Temp Source 11/18/20 0827 Oral     SpO2 11/18/20 0827 98 %     Weight --      Height --      Head Circumference --      Peak Flow --      Pain Score 11/18/20 0826 5     Pain Loc --      Pain Edu? --      Excl. in GC? --    Updated Vital Signs BP 124/87 (BP Location: Right Arm)   Pulse 76   Temp 97.7  F (36.5 C) (Oral)   Resp 16   Ht 5\' 11"  (1.803 m)   Wt 204 lb (92.5 kg)   SpO2 96%   BMI 28.45 kg/m   Physical Exam Vitals and nursing note reviewed.  Constitutional:  General: He is not in acute distress.    Appearance: Normal appearance. He is normal weight. He is not ill-appearing.  HENT:     Head: Normocephalic and atraumatic.  Eyes:     Extraocular Movements: Extraocular movements intact.     Conjunctiva/sclera: Conjunctivae normal.     Pupils: Pupils are equal, round, and reactive to light.  Cardiovascular:     Rate and Rhythm: Normal rate and regular rhythm.  Pulmonary:     Effort: Pulmonary effort is normal.     Breath sounds: Normal breath sounds.  Musculoskeletal:     Cervical back: Neck supple. Spasms, tenderness and bony tenderness present. Pain with movement present. Decreased range of motion.     Thoracic back: Normal.     Lumbar back: Normal.  Skin:    General: Skin is warm and dry.  Neurological:     General: No focal deficit present.     Mental Status: He is alert and oriented to person, place, and time. Mental status is at baseline.     Cranial Nerves: Cranial nerves 2-12 are intact.     Sensory: Sensation is intact.     Motor: Weakness present. No tremor.     Coordination: Coordination is intact.  Psychiatric:        Mood and Affect: Mood normal.        Behavior: Behavior normal.        Thought Content: Thought content normal.        Judgment: Judgment normal.     [facility] Couse / Diagnostics / Procedures:     Radiology DG Cervical Spine Complete  Result Date: 09/09/2021 CLINICAL DATA:  Right-sided neck pain for 2 weeks EXAM: CERVICAL SPINE - COMPLETE 4+ VIEW COMPARISON:  None Available. FINDINGS: The cervical spine is imaged through the C7 vertebral body in the lateral projection. Vertebral body heights are preserved, without evidence of acute injury. Alignment is normal. There is marked disc space narrowing with bulky anterior osteophytes at  C5-C6 and C6-C7 and to a lesser degree at C4-C5. There is overall mild multilevel neural foraminal stenosis without definite high-grade osseous foraminal narrowing. The prevertebral soft tissues are unremarkable. The imaged lung apices are clear. IMPRESSION: 1. Disc space narrowing and bulky anterior osteophytes most notable at C5-C6 and C6-C7. 2. Normal alignment.  No acute finding. Electronically Signed   By: Lesia Hausen M.D.   On: 09/09/2021 11:08    Procedures Procedures (including critical care time) EKG  Pending results:  Labs Reviewed - No data to display  Medications Ordered in UC: Medications - No data to display  UC Diagnoses / Final Clinical Impressions(s)   I have reviewed the triage vital signs and the nursing notes.  Pertinent labs & imaging results that were available during my care of the patient were reviewed by me and considered in my medical decision making (see chart for details).    Final diagnoses:  Osteoarthritis of spine with radiculopathy, cervical region   X-ray of cervical spine did reveal degenerative arthritic changes.  Patient advised that this is the likely source of the pain, numbness and weakness in his right arm and hand at this time.  Patient provided with a prescription for methylprednisolone tapering dose pack.  Patient advised to follow-up with provider that he is currently seeing about his lower back pain to discuss further treatment options for his cervical radiculopathy.  Return precautions advised.   ED Prescriptions     Medication Sig Dispense Auth. Provider  methylPREDNISolone (MEDROL DOSEPAK) 4 MG TBPK tablet Take 24 mg on day 1, 20 mg on day 2, 16 mg on day 3, 12 mg on day 4, 8 mg on day 5, 4 mg on day 6.  Take all tablets in each row at once, do not spread tablets out throughout the day. 21 tablet Theadora Rama Scales, PA-C      PDMP not reviewed this encounter.  Discharge Instructions:   Discharge Instructions      The x-ray of  your neck does show narrowing of the spinal column at the level of C5-C6 and C6-C7, essentially the base of your neck.  This is also the area that affects the nerves that go out to your arms which is why you are having difficulty with pain, numbness, weakness in your right arm and hand.  I recommend that you begin a tapering dose of the steroid today to see if we can reduce some of the inflammation that is causing your nerves to act up at this time.  Please take 1 row of tablets daily with your morning meal.  Please follow-up with the doctor that is currently caring for your lower back pain regarding your new findings of stenosis in your neck.  Thank you for visiting urgent care today.      Disposition Upon Discharge:  Condition: stable for discharge home Home: take medications as prescribed; routine discharge instructions as discussed; follow up as advised.  Patient presented with an acute illness with associated systemic symptoms and significant discomfort requiring urgent management. In my opinion, this is a condition that a prudent lay person (someone who possesses an average knowledge of health and medicine) may potentially expect to result in complications if not addressed urgently such as respiratory distress, impairment of bodily function or dysfunction of bodily organs.   Routine symptom specific, illness specific and/or disease specific instructions were discussed with the patient and/or caregiver at length.   As such, the patient has been evaluated and assessed, work-up was performed and treatment was provided in alignment with urgent care protocols and evidence based medicine.  Patient/parent/caregiver has been advised that the patient may require follow up for further testing and treatment if the symptoms continue in spite of treatment, as clinically indicated and appropriate.  Patient/parent/caregiver has been advised to report to orthopedic urgent care clinic or return to the [facility]  or PCP in 3-5 days if no better; follow-up with orthopedics, PCP or the Emergency Department if new signs and symptoms develop or if the current signs or symptoms continue to change or worsen for further workup, evaluation and treatment as clinically indicated and appropriate  The patient will follow up with their current PCP if and as advised. If the patient does not currently have a PCP we will have assisted them in obtaining one.   The patient may need specialty follow up if the symptoms continue, in spite of conservative treatment and management, for further workup, evaluation, consultation and treatment as clinically indicated and appropriate.  Patient/parent/caregiver verbalized understanding and agreement of plan as discussed.  All questions were addressed during visit.  Please see discharge instructions below for further details of plan.  This office note has been dictated using Teaching laboratory technician.  Unfortunately, this method of dictation can sometimes lead to typographical or grammatical errors.  I apologize for your inconvenience in advance if this occurs.  Please do not hesitate to reach out to me if clarification is needed.      Lequita Halt,  Lillia Abed Scales, PA-C 09/09/21 1204

## 2021-09-11 ENCOUNTER — Encounter: Payer: Self-pay | Admitting: *Deleted

## 2021-09-11 NOTE — Telephone Encounter (Signed)
I called the patient and his arm pain is less.  He is on an antibiotic for his bladder infection and is no longer on the cephalexin I advised him to keep his follow-up appointments with urology and with PA Mcclung next week

## 2021-09-18 ENCOUNTER — Ambulatory Visit: Payer: Self-pay

## 2021-09-18 ENCOUNTER — Ambulatory Visit: Payer: Self-pay | Admitting: *Deleted

## 2021-09-18 NOTE — Telephone Encounter (Signed)
Reason for Disposition  Patient sounds very sick or weak to the triager    Instructed to call urologist that removed the Foley catheter this morning or go to the ED if unable to contact the urologist.  Answer Assessment - Initial Assessment Questions 1. SYMPTOMS: "What symptoms are you concerned about?"      He had a urinary Foley catheter removed this morning.   They removed the catheter in the urologist's office this morning.    They filled me up to see if I could urinate.  It really hurt to urinate and I let them know.   I came home and they told me I should not have any more pain.   I had to rush to the bathroom and when I urinated it hurt so bad.   I was still hurting a half hour later after urinating.    That's why I called this morning and talked with a nurse.  [facility] triage nurse)  The urologist gave me some antibiotics so I took 2 of them. The nurse told me  to call later.   That's why I'm calling.  It's hard to get through to the urologist office that's why I'm calling y'all.  International aid/development worker and Wellness).    I'm still having pain when I urinate.  I let him know if he could not get through to the urologist and he was unable to urinate or was still in severe pain since having the catheter removed to go to the ED.   He was agreeable to this plan.   "I'm going to try and call the urologist office right now".      2. ONSET:  "When did the symptoms start?"     This morning in the urologist's office after they removed the catheter. 3. FEVER: "Is there a fever?" If Yes, ask: "What is the temperature, how was it measured, and when did it start?"     Not asked 4. ABDOMEN PAIN: "Is there any abdomen pain?" (e.g., Scale 1-10; or mild, moderate, severe)     Not asked 5. URINE COLOR: "What color is the urine?"  "Is there blood present in the urine?" (e.g., clear, yellow, cloudy, tea-colored, blood streaks, bright red)     Not asked but having pain with urination 6. URINE AMOUNT: "When did you  last empty the urine from the collection bag?" "How much urine was in the bag at that time?" How much urine is in the collection bag now?"     Foley catheter is out 7. INSERTION: "How long have you (they) had the catheter?"     Removed this morning 8. OTHER SYMPTOMS: "Are there any other symptoms?" (e.g., abdomen swelling, back pain, bladder spasms, constipation, foul smelling urine, leaking of urine)      Severe pain with urination since the removal. 9. MEDICINES: "Are you taking any medicines to treat urinary problems?" (e.g., antibiotics for a urinary tract infection, medicines to treat bladder spasms)      They gave me antibiotics and told me to take 2 today which I did. 10. PREGNANCY: "Is there any chance you are pregnant?" "When was your last menstrual period?"       N/A  Protocols used: Urinary Catheter (e.g., Foley) Symptoms and Questions-A-AH

## 2021-09-18 NOTE — Telephone Encounter (Signed)
  Chief Complaint: Painful urination  Symptoms: Painful urination Frequency: Today Pertinent Negatives: Patient denies fever Disposition: [] ED /[] Urgent Care (no appt availability in office) / [] Appointment(In office/virtual)/ []  [place] [facility]/ [] Home Care/ [] Refused Recommended Disposition /[] [place] [facility]/ [x]  Follow-up with PCP Additional Notes: Pt states he had the catheter removed today, pt states that it is very painful when he urinates.  PT will take 600 IBU and antibiotic prescribed. Pt will call back in 2 hours if no improvement.    Reason for Disposition  [1] SEVERE pain with urination (e.g., excruciating) AND [2] not improved after 2 hours of pain medicine (e.g., acetaminophen or ibuprofen)  Answer Assessment - Initial Assessment Questions 1. SEVERITY: "How bad is the pain?"  (e.g., Scale 1-10; mild, moderate, or severe)   - MILD (1-3): Complains slightly about urination hurting.   - MODERATE (4-7): Interferes with normal activities.     - SEVERE (8-10): Excruciating, unwilling or unable to urinate because of the pain.      10/10 2. FREQUENCY: "How many times have you had painful urination today?"      1X 3. PATTERN: "Is pain present every time you urinate or just sometimes?"      Each time 4. ONSET: "When did the painful urination start?"      today 5. FEVER: "Do you have a fever?" If Yes, ask: "What is your temperature, how was it measured, and when did it start?"     no 6. PAST UTI: "Have you had a urine infection before?" If Yes, ask: "When was the last time?" and "What happened that time?"      yes 7. CAUSE: "What do you think is causing the painful urination?"      Removing catheter 8. OTHER SYMPTOMS: "Do you have any other symptoms?" (e.g., flank pain, penis discharge, scrotal pain, blood in urine)     no  Protocols used: Urination Pain - Male-A-AH

## 2021-09-18 NOTE — Telephone Encounter (Signed)
  Chief Complaint: Painful urination after Foley catheter removal this morning at urologist's office. Symptoms: Severe pain with urination Frequency: since catheter removed this morning Pertinent Negatives: Patient denies N/A Disposition: [x] ED /[] Urgent Care (no appt availability in office) / [] Appointment(In office/virtual)/ []  [place] [facility]/ [] Home Care/ [] Refused Recommended Disposition /[] [place] [facility]/ []  Follow-up with PCP Additional Notes: If unable to get in contact with urologist go to ED

## 2021-09-19 NOTE — Telephone Encounter (Signed)
Patient states he reached out to Urologist again. They were able to help him. He is feeling better.

## 2021-09-19 NOTE — Telephone Encounter (Signed)
Patient states he reached out to Urologist again. They were able to help him. He is feeling better.  

## 2021-09-21 ENCOUNTER — Ambulatory Visit: Payer: Medicaid Other | Attending: Physician Assistant | Admitting: Physician Assistant

## 2021-09-21 ENCOUNTER — Encounter: Payer: Self-pay | Admitting: Physician Assistant

## 2021-09-21 VITALS — BP 127/83 | HR 88 | Ht 71.0 in | Wt 197.8 lb

## 2021-09-21 DIAGNOSIS — M545 Low back pain, unspecified: Secondary | ICD-10-CM | POA: Insufficient documentation

## 2021-09-21 DIAGNOSIS — M48061 Spinal stenosis, lumbar region without neurogenic claudication: Secondary | ICD-10-CM

## 2021-09-21 DIAGNOSIS — M79601 Pain in right arm: Secondary | ICD-10-CM | POA: Insufficient documentation

## 2021-09-21 DIAGNOSIS — M4722 Other spondylosis with radiculopathy, cervical region: Secondary | ICD-10-CM | POA: Diagnosis not present

## 2021-09-21 DIAGNOSIS — Z09 Encounter for follow-up examination after completed treatment for conditions other than malignant neoplasm: Secondary | ICD-10-CM

## 2021-09-21 DIAGNOSIS — M5412 Radiculopathy, cervical region: Secondary | ICD-10-CM

## 2021-09-21 DIAGNOSIS — M47816 Spondylosis without myelopathy or radiculopathy, lumbar region: Secondary | ICD-10-CM | POA: Diagnosis not present

## 2021-09-21 DIAGNOSIS — M5136 Other intervertebral disc degeneration, lumbar region: Secondary | ICD-10-CM

## 2021-09-21 DIAGNOSIS — M79643 Pain in unspecified hand: Secondary | ICD-10-CM | POA: Insufficient documentation

## 2021-09-21 DIAGNOSIS — R531 Weakness: Secondary | ICD-10-CM | POA: Diagnosis not present

## 2021-09-21 DIAGNOSIS — M25511 Pain in right shoulder: Secondary | ICD-10-CM | POA: Insufficient documentation

## 2021-09-21 DIAGNOSIS — M2578 Osteophyte, vertebrae: Secondary | ICD-10-CM | POA: Diagnosis not present

## 2021-09-21 MED ORDER — OXYTROL 3.9 MG/24HR TD PTTW
MEDICATED_PATCH | TRANSDERMAL | 2 refills | Status: DC
Start: 2021-09-21 — End: 2021-12-26

## 2021-09-21 NOTE — Progress Notes (Signed)
Patient ID: Jeremiah Hunter, male   DOB: [birth year], 61 y.o.   MRN: [id number]   Jeremiah Hunter, is a 61 y.o. male  [id number]:[id number]  [id number]  DOB - [birth year]  Chief Complaint  Patient presents with   Hospitalization Follow-up       Subjective:   Jeremiah Hunter is a 61 y.o. male here today for a follow up visit after being seen in the ED R arm pain and weakness.  Ortho referral was recommended for this and his lower back pain.  His c-spine xray shows osteophytes and disc narrowing.  He needs RF of urinary medication.    From ED note:  C-spine xray 09/09/2021: IMPRESSION: 1. Disc space narrowing and bulky anterior osteophytes most notable at C5-C6 and C6-C7. 2. Normal alignment.  No acute finding. After UC visit  MR Lumbar spine 11/30/2020: IMPRESSION: 1. Lower lumbar spondylosis resulting in severe left and moderate right foraminal stenosis at the L3-4 level and moderate-to-severe bilateral foraminal stenosis at L4-L5. 2. No canal stenosis at any level.  HPI Jeremiah Hunter is a pleasant, 61 y.o. male who presents to urgent care today. Patient complains of intermittent right arm pain.  Also complains of pain in the right shoulder, tingling and numbness in the hand. Patient also having weakness in the right hand when trying to grab/hold things. States his hand will let go of and drop things.  Patient reports a history of lower back pain, states he currently has a urinary catheter in place because he has difficulty going to the bathroom.  EMR reviewed, patient has a history of lumbar spinal stenosis with radiculopathy.   X-ray of cervical spine did reveal degenerative arthritic changes.  Patient advised that this is the likely source of the pain, numbness and weakness in his right arm and hand at this time.  Patient provided with a prescription for methylprednisolone tapering dose pack.  Patient advised to follow-up with provider that he is currently seeing about his lower back pain to  discuss further treatment options for his cervical radiculopathy.    No problems updated.  ALLERGIES: Allergies  Allergen Reactions   Losartan Itching    PAST MEDICAL HISTORY: Past Medical History:  Diagnosis Date   Acute bilateral low back pain without sciatica 07/30/2019   Allergy    on meds   Anxiety    on meds   DDD (degenerative disc disease), lumbar    on meds   Enlarged prostate    GERD (gastroesophageal reflux disease)    on meds   Hypertension    on meds   Severe episode of recurrent major depressive disorder, with psychotic features (HCC) 03/24/2019   on meds   Sleep apnea    uses CPAP    MEDICATIONS AT HOME: Prior to Admission medications   Medication Sig Start Date End Date Taking? Authorizing Provider  amLODipine (NORVASC) 5 MG tablet TAKE 1 TABLET(5 MG) BY MOUTH DAILY 06/13/21   Storm Frisk, MD  azelastine (ASTELIN) 0.1 % nasal spray Place 2 sprays into both nostrils 2 (two) times daily. Use in each nostril as directed 05/17/21   Storm Frisk, MD  B Complex-C-E-Zn (EQL STRESS B-COMPLEX C/ZINC) TABS One a day po. With food. 09/11/19   Dohmeier, Porfirio Mylar, MD  baclofen (LIORESAL) 10 MG tablet Take 10 mg by mouth 3 (three) times daily. 09/02/21   [provider]  cefdinir (OMNICEF) 300 MG capsule Take 300 mg by mouth 2 (two) times daily. 09/08/21   [provider]  chlorhexidine (PERIDEX) 0.12 % solution Use as directed 15 mLs in the mouth or throat 2 (two) times daily. 06/21/21   Storm Frisk, MD  Cyanocobalamin (VITAMIN B-12 PO) Take 1 tablet by mouth daily at 6 (six) AM.    [provider]  cyclobenzaprine (FLEXERIL) 10 MG tablet Take 1 tablet (10 mg total) by mouth 3 (three) times daily as needed for muscle spasms. 08/02/21   Mayers, Cari S, PA-C  divalproex (DEPAKOTE ER) 500 MG 24 hr tablet Take 1 tablet (500 mg total) by mouth in the morning and at bedtime. 06/13/21   Storm Frisk, MD  FLUoxetine (PROZAC) 20 MG capsule  Take 1 capsule (20 mg total) by mouth daily. 06/13/21   Storm Frisk, MD  ibuprofen (ADVIL) 600 MG tablet Take 1 tablet (600 mg total) by mouth 3 (three) times daily. 08/26/21   Ellsworth Lennox, PA-C  lidocaine-prilocaine (EMLA) cream Apply 1 application topically as needed. 09/27/20   Storm Frisk, MD  loratadine (CLARITIN) 10 MG tablet TAKE 1 TABLET(10 MG) BY MOUTH DAILY 02/18/21   Storm Frisk, MD  methylPREDNISolone (MEDROL DOSEPAK) 4 MG TBPK tablet Take 24 mg on day 1, 20 mg on day 2, 16 mg on day 3, 12 mg on day 4, 8 mg on day 5, 4 mg on day 6.  Take all tablets in each row at once, do not spread tablets out throughout the day. 09/09/21   Theadora Rama Scales, PA-C  ondansetron (ZOFRAN-ODT) 4 MG disintegrating tablet Take 1 tablet (4 mg total) by mouth every 8 (eight) hours as needed for nausea or vomiting. 08/10/21   Rising, Lurena Joiner, PA-C  OXYTROL 3.9 MG/24HR Apply patch two times a week 09/21/21   Anders Simmonds, PA-C  polyethylene glycol (MIRALAX) 17 g packet Take 17 g by mouth daily. 09/01/21   Glyn Ade, MD  QUEtiapine (SEROQUEL) 100 MG tablet Take 1 tablet (100 mg total) by mouth at bedtime. 06/13/21   Storm Frisk, MD  Vitamin D, Ergocalciferol, (DRISDOL) 1.25 MG (50000 UNIT) CAPS capsule Take 1 capsule (50,000 Units total) by mouth every 7 (seven) days. 06/14/21   Storm Frisk, MD    ROS: Neg HEENT Neg resp Neg cardiac Neg GI Neg GU Neg psych Neg neuro  Objective:   Vitals:   09/21/21 1347  BP: 127/83  Pulse: 88  SpO2: 96%  Weight: 197 lb 12.8 oz (89.7 kg)  Height: 5\' 11"  (1.803 m)   Exam General appearance : Awake, alert, not in any distress. Speech Clear. Not toxic looking HEENT: Atraumatic and Normocephalic Neck: Supple, no JVD. No cervical lymphadenopathy.  Chest: Good air entry bilaterally, CTAB.  No rales/rhonchi/wheezing CVS: S1 S2 regular, no murmurs.  Extremities: B/L Lower Ext shows no edema, both legs are warm to touch Neurology:  Awake alert, and oriented X 3, CN II-XII intact, Non focal Skin: No Rash  Data Review No results found for: "HGBA1C"  Assessment & Plan   1. DDD (degenerative disc disease), lumbar - Ambulatory referral to Orthopedic Surgery  2. Lumbar foraminal stenosis - Ambulatory referral to Orthopedic Surgery  3. Cervical radiculopathy - Ambulatory referral to Orthopedic Surgery  4. Encounter for examination following treatment at hospital    Return in about 4 months (around 01/21/2022) for PCP for chronic conditions.  The patient was given clear instructions to go to ER or return to medical center if symptoms don't improve, worsen or new problems develop. The patient verbalized understanding. The patient  was told to call to get lab results if they haven't heard anything in the next week.      Freeman Caldron, PA-C [facility] [place], [place]   09/21/2021, 2:03 PM

## 2021-09-22 ENCOUNTER — Ambulatory Visit: Payer: Self-pay

## 2021-09-22 NOTE — Telephone Encounter (Signed)
Pt called back to say they called the podiatrist and was given the number to the doctor on call. He called to ask if he should just go ahead and call the podiatrist on call. I advised him to, pt states he will.

## 2021-09-22 NOTE — Telephone Encounter (Signed)
Routing to PCP

## 2021-09-22 NOTE — Telephone Encounter (Signed)
The patient shares that they received a shot from their podiatrist on 09/08/21 and shares that they have experienced numbness in their foot since   The patient has not noticed any swelling or skin discoloration   The patient shares that the numbness is only in their foot not leg   The patient would like to speak with a member of clinical staff when possible   Please contact further      Chief Complaint: Pt. States his podiatrist gave him a shot in top right foot and he has had numbness ever since. Instructed to notify podiatry. Wants his PCP aware. Symptoms: Above Frequency: 2 weeks ago Pertinent Negatives: Patient denies  Disposition: [] ED /[] Urgent Care (no appt availability in office) / [] Appointment(In office/virtual)/ []  [place] [facility]/ [] Home Care/ [] Refused Recommended Disposition /[] [place] [facility]/ [x]  Follow-up with PCP Additional Notes: Please advise pt.  Answer Assessment - Initial Assessment Questions 1. SYMPTOM: "What is the main symptom you are concerned about?" (e.g., weakness, numbness)     Right foot  2. ONSET: "When did this start?" (minutes, hours, days; while sleeping)     2 weeks ago  3. LAST NORMAL: "When was the last time you (the patient) were normal (no symptoms)?"     2 weeks  4. PATTERN "Does this come and go, or has it been constant since it started?"  "Is it present now?"     Constant 5. CARDIAC SYMPTOMS: "Have you had any of the following symptoms: chest pain, difficulty breathing, palpitations?"     No 6. NEUROLOGIC SYMPTOMS: "Have you had any of the following symptoms: headache, dizziness, vision loss, double vision, changes in speech, unsteady on your feet?"     No 7. OTHER SYMPTOMS: "Do you have any other symptoms?"     No 8. PREGNANCY: "Is there any chance you are pregnant?" "When was your last menstrual period?"     N/a  Protocols used: Neurologic Deficit-A-AH

## 2021-09-23 ENCOUNTER — Encounter: Payer: Self-pay | Admitting: Critical Care Medicine

## 2021-09-26 MED ORDER — FLUTICASONE PROPIONATE 50 MCG/ACT NA SUSP: 2.0000 | Freq: Every day | NASAL | 6 refills | Status: DC

## 2021-09-27 NOTE — Telephone Encounter (Signed)
I spoke to the patient and he said he currently receives 2 hrs x 7 days/week.  We can submit a request for additional hours but there is no guarantee he will receive more hours. There has to be a significant change in his functional status I explained to him that [employer] will do another in home assessment if needed to determine if additional hours are necessary and he said he understood.  He will also need a visit with you, it can be virtual, before the referral is placed because he has not seen you in over 90 days.

## 2021-10-02 ENCOUNTER — Telehealth: Payer: Self-pay | Admitting: Podiatry

## 2021-10-02 ENCOUNTER — Telehealth: Payer: Self-pay

## 2021-10-02 NOTE — Telephone Encounter (Signed)
Sshould be fine

## 2021-10-02 NOTE — Telephone Encounter (Signed)
I haven't received any messages on our voicemail from Mr. Levit and I don't see any previous documentation about it from anyone else. I will give the patient a call.

## 2021-10-02 NOTE — Telephone Encounter (Signed)
Patient called states he is having a reaction to the injections that he was given on 8/18 , his foot is numb and stinging.   Would like to speak to someone, he said he has been leaving messages on  voicemail and no one has called him ? Please advise

## 2021-10-03 ENCOUNTER — Ambulatory Visit: Payer: Medicaid Other | Attending: Critical Care Medicine | Admitting: Critical Care Medicine

## 2021-10-03 ENCOUNTER — Encounter: Payer: Self-pay | Admitting: Critical Care Medicine

## 2021-10-03 DIAGNOSIS — I10 Essential (primary) hypertension: Secondary | ICD-10-CM | POA: Diagnosis not present

## 2021-10-03 DIAGNOSIS — M489 Spondylopathy, unspecified: Secondary | ICD-10-CM | POA: Diagnosis not present

## 2021-10-03 DIAGNOSIS — M5136 Other intervertebral disc degeneration, lumbar region: Secondary | ICD-10-CM | POA: Diagnosis not present

## 2021-10-03 DIAGNOSIS — R296 Repeated falls: Secondary | ICD-10-CM | POA: Diagnosis not present

## 2021-10-03 NOTE — Assessment & Plan Note (Signed)
Hypertension at goal no changes

## 2021-10-03 NOTE — Progress Notes (Signed)
Established Patient Office Visit  Subjective:  Patient ID: Jeremiah Hunter, male    DOB: 07-27-1960  Age: 61 y.o. MRN: [id number] Virtual Visit via Telephone Note  I connected with Jeremiah Hunter on 10/03/21 at  3:00 PM EDT by telephone and verified that I am speaking with the correct person using two identifiers.   Consent:  I discussed the limitations, risks, security and privacy concerns of performing an evaluation and management service by telephone and the availability of in person appointments. I also discussed with the patient that there may be a patient responsible charge related to this service. The patient expressed understanding and agreed to proceed.  Location of patient: Patient's at home  Location of provider: I am in my office  Persons participating in the televisit with the patient.   No one else on the call   CC:  Assess PCS status   HPI 01/26/21  Jeremiah Hunter is a 61 year old man who presents for primary care follow up today. His only acute complaint at this visit is some bodily itching, particularly on the right side of his neck and shoulder.   Mr. Crum has a history of low back pain and degenerative disc disease. He received an MRI for this and has completed physical therapy as recommended by orthopedics. He has joined a gym recently and is interested and motivated to begin exercising more frequently.   His hypertension is currently being managed with Amlodipine and he takes this daily. He denies taking home blood pressures at this time. His sleep apnea also remains well managed with the use of his CPAP machine at home.  Patient did mention that his mood has been low lately, as he has had multiple family members and friends pass away recently. He also has some worries surrounding his safety at his apartment.   06/13/21 This patient returns in follow-up and is complaining of some itching on the scalp and some cramps in the legs when walking.  On arrival blood  pressure is good 132/84  Patient's been compliant with all his medications and his CPAP machine.  He does not have any new complaints he does need refills on all his medications at this time.  He has not had any recent falls.  9/12 Patient seen by way of a phone visit to assess status for personal care services currently is receiving services 2 hours a day 7 days a week with [facility].  They are going to come back in October for reassessment to see if he deserves more time.  He is having assistance with brushing his teeth other his activities of daily living.  Note since the last visit in May he has been to the emergency room for bladder cystitis and has been with urology at least once he received course of antibiotics and tamsulosin he is taking tamsulosin finished antibiotics.  He has refills on all his medications.  He has had frequent falls.  He has an upcoming appointment with spine physician because he has lumbar spine disease and then saw [facility]ung end of August and found to have cervical spine disease as well.  Past Medical History:  Diagnosis Date   Acute bilateral low back pain without sciatica 07/30/2019   Allergy    on meds   Anxiety    on meds   DDD (degenerative disc disease), lumbar    on meds   Enlarged prostate    GERD (gastroesophageal reflux disease)    on meds  Hypertension    on meds   Severe episode of recurrent major depressive disorder, with psychotic features ([place]) 03/24/2019   on meds   Sleep apnea    uses CPAP    Past Surgical History:  Procedure Laterality Date   CYSTECTOMY     DENTAL SURGERY     teeth pulled   HERNIA REPAIR      Family History  Family history unknown: Yes    Social History   Socioeconomic History   Marital status: Single    Spouse name: Not on file   Number of children: Not on file   Years of education: Not on file   Highest education level: Not on file  Occupational History   Not on file  Tobacco Use   Smoking  status: Never   Smokeless tobacco: Never  Vaping Use   Vaping Use: Never used  Substance and Sexual Activity   Alcohol use: Never   Drug use: Never   Sexual activity: Not Currently  Other Topics Concern   Not on file  Social History Narrative   Not on file   Social Determinants of Health   Financial Resource Strain: Not on file  Food Insecurity: Not on file  Transportation Needs: Not on file  Physical Activity: Not on file  Stress: Not on file  Social Connections: Not on file  Intimate Partner Violence: Not on file    Outpatient Medications Prior to Visit  Medication Sig Dispense Refill   amLODipine (NORVASC) 5 MG tablet TAKE 1 TABLET(5 MG) BY MOUTH DAILY 60 tablet 4   azelastine (ASTELIN) 0.1 % nasal spray Place 2 sprays into both nostrils 2 (two) times daily. Use in each nostril as directed 30 mL 12   B Complex-C-E-Zn (EQL STRESS B-COMPLEX C/ZINC) TABS One a day po. With food.     baclofen (LIORESAL) 10 MG tablet Take 10 mg by mouth 3 (three) times daily.     chlorhexidine (PERIDEX) 0.12 % solution Use as directed 15 mLs in the mouth or throat 2 (two) times daily. 120 mL 0   Cyanocobalamin (VITAMIN B-12 PO) Take 1 tablet by mouth daily at 6 (six) AM.     cyclobenzaprine (FLEXERIL) 10 MG tablet Take 1 tablet (10 mg total) by mouth 3 (three) times daily as needed for muscle spasms. 30 tablet 0   divalproex (DEPAKOTE ER) 500 MG 24 hr tablet Take 1 tablet (500 mg total) by mouth in the morning and at bedtime. 60 tablet 4   FLUoxetine (PROZAC) 20 MG capsule Take 1 capsule (20 mg total) by mouth daily. 60 capsule 2   fluticasone (FLONASE) 50 MCG/ACT nasal spray Place 2 sprays into both nostrils daily. 16 g 6   ibuprofen (ADVIL) 600 MG tablet Take 1 tablet (600 mg total) by mouth 3 (three) times daily. 30 tablet 0   lidocaine-prilocaine (EMLA) cream Apply 1 application topically as needed. 30 g 5   loratadine (CLARITIN) 10 MG tablet TAKE 1 TABLET(10 MG) BY MOUTH DAILY 30 tablet 4    ondansetron (ZOFRAN-ODT) 4 MG disintegrating tablet Take 1 tablet (4 mg total) by mouth every 8 (eight) hours as needed for nausea or vomiting. 12 tablet 0   OXYTROL 3.9 MG/24HR Apply patch two times a week 8 patch 2   phenazopyridine (PYRIDIUM) 200 MG tablet Take 200 mg by mouth 3 (three) times daily.     polyethylene glycol (MIRALAX) 17 g packet Take 17 g by mouth daily. 14 each 0   QUEtiapine (  SEROQUEL) 100 MG tablet Take 1 tablet (100 mg total) by mouth at bedtime. 60 tablet 2   tamsulosin (FLOMAX) 0.4 MG CAPS capsule Take 0.4 mg by mouth at bedtime.     Vitamin D, Ergocalciferol, (DRISDOL) 1.25 MG (50000 UNIT) CAPS capsule Take 1 capsule (50,000 Units total) by mouth every 7 (seven) days. 12 capsule 5   cefdinir (OMNICEF) 300 MG capsule Take 300 mg by mouth 2 (two) times daily.     ciprofloxacin (CIPRO) 500 MG tablet Take 500 mg by mouth 2 (two) times daily.     methylPREDNISolone (MEDROL DOSEPAK) 4 MG TBPK tablet Take 24 mg on day 1, 20 mg on day 2, 16 mg on day 3, 12 mg on day 4, 8 mg on day 5, 4 mg on day 6.  Take all tablets in each row at once, do not spread tablets out throughout the day. 21 tablet 0   No facility-administered medications prior to visit.    Allergies  Allergen Reactions   Losartan Itching    ROS Review of Systems  Constitutional: Negative.   HENT: Negative.  Negative for ear pain, postnasal drip, rhinorrhea, sinus pressure, sore throat, trouble swallowing and voice change.   Eyes: Negative.   Respiratory: Negative.  Negative for apnea, cough, choking, chest tightness, shortness of breath, wheezing and stridor.   Cardiovascular: Negative.  Negative for chest pain, palpitations and leg swelling.  Gastrointestinal: Negative.  Negative for abdominal distention, abdominal pain, nausea and vomiting.  Endocrine: Negative.   Genitourinary: Negative.   Musculoskeletal:  Positive for back pain. Negative for arthralgias and myalgias.  Skin:  Negative for rash.   Allergic/Immunologic: Negative.  Negative for environmental allergies and food allergies.  Neurological: Negative.  Negative for dizziness, syncope, numbness and headaches. Weakness: frequent falls. Hematological: Negative.  Negative for adenopathy. Does not bruise/bleed easily.  Psychiatric/Behavioral:  Negative for agitation, dysphoric mood, self-injury, sleep disturbance and suicidal ideas. The patient is not nervous/anxious.       Objective:    No exam this was a phone visit  There were no vitals taken for this visit. Wt Readings from Last 3 Encounters:  09/21/21 197 lb 12.8 oz (89.7 kg)  09/09/21 204 lb (92.5 kg)  09/04/21 200 lb (90.7 kg)     Health Maintenance Due  Topic Date Due   COLON CANCER SCREENING ANNUAL FOBT  06/30/2021   INFLUENZA VACCINE  08/22/2021    There are no preventive care reminders to display for this patient.  Lab Results  Component Value Date   TSH 2.170 03/24/2019   Lab Results  Component Value Date   WBC 6.8 09/04/2021   HGB 13.3 09/04/2021   HCT 39.5 09/04/2021   MCV 85.3 09/04/2021   PLT 231 09/04/2021   Lab Results  Component Value Date   NA 135 09/04/2021   K 3.4 (L) 09/04/2021   CO2 26 09/04/2021   GLUCOSE 141 (H) 09/04/2021   BUN 15 09/04/2021   CREATININE 1.22 09/04/2021   BILITOT 0.6 09/04/2021   ALKPHOS 70 09/04/2021   AST 19 09/04/2021   ALT 11 09/04/2021   PROT 6.8 09/04/2021   ALBUMIN 3.7 09/04/2021   CALCIUM 8.9 09/04/2021   ANIONGAP 5 09/04/2021   EGFR 75 07/07/2021   Lab Results  Component Value Date   CHOL 145 06/13/2021   Lab Results  Component Value Date   HDL 59 06/13/2021   Lab Results  Component Value Date   LDLCALC 66 06/13/2021   Lab  Results  Component Value Date   TRIG 110 06/13/2021   Lab Results  Component Value Date   CHOLHDL 2.5 06/13/2021   No results found for: "HGBA1C"    Assessment & Plan:   Problem List Items Addressed This Visit       Cardiovascular and Mediastinum    Essential hypertension    Hypertension at goal no changes        Musculoskeletal and Integument   DDD (degenerative disc disease), lumbar    Patient with lumbar disc disease has follow-up with orthopedic spine told to keep this appointment        Other   Frequent falls    Continues to have unstable balance being assessed for more hours for PCS      Cervical spine disease    X-ray showing involvement of the cervical spine suspect some of his hand symptoms are from same he will discuss this with orthopedics     No orders of the defined types were placed in this encounter. IFollow Up Instructions:  Patient knows follow-up will occur I discussed the assessment and treatment plan with the patient. The patient was provided an opportunity to ask questions and all were answered. The patient agreed with the plan and demonstrated an understanding of the instructions.   The patient was advised to call back or seek an in-person evaluation if the symptoms worsen or if the condition fails to improve as anticipated.  I provided 30 minutes of non-face-to-face time during this encounter  including  median intraservice time , review of notes, labs, imaging, medications  and explaining diagnosis and management to the patient .    Asencion Noble, MD

## 2021-10-03 NOTE — Assessment & Plan Note (Signed)
Continues to have unstable balance being assessed for more hours for [facility]

## 2021-10-03 NOTE — Assessment & Plan Note (Signed)
Patient with lumbar disc disease has follow-up with orthopedic spine told to keep this appointment

## 2021-10-03 NOTE — Assessment & Plan Note (Signed)
X-ray showing involvement of the cervical spine suspect some of his hand symptoms are from same he will discuss this with orthopedics

## 2021-10-04 LAB — GLUCOSE, POCT (MANUAL RESULT ENTRY): POC Glucose: 121 mg/dl — AB (ref 70–99)

## 2021-10-10 ENCOUNTER — Encounter: Payer: Self-pay | Admitting: Orthopaedic Surgery

## 2021-10-10 ENCOUNTER — Ambulatory Visit (INDEPENDENT_AMBULATORY_CARE_PROVIDER_SITE_OTHER): Payer: Medicaid Other | Admitting: Orthopaedic Surgery

## 2021-10-10 VITALS — BP 120/78 | HR 82 | Ht 71.0 in | Wt 197.0 lb

## 2021-10-10 DIAGNOSIS — M489 Spondylopathy, unspecified: Secondary | ICD-10-CM | POA: Diagnosis not present

## 2021-10-10 DIAGNOSIS — M542 Cervicalgia: Secondary | ICD-10-CM

## 2021-10-10 DIAGNOSIS — M4712 Other spondylosis with myelopathy, cervical region: Secondary | ICD-10-CM | POA: Diagnosis not present

## 2021-10-10 NOTE — Progress Notes (Signed)
Office Visit Note   Patient: Jeremiah Hunter           Date of Birth: 10-12-1960           MRN: [id number] Visit Date: 10/10/2021              Requested by: Jeremiah Donovan, PA-C [place],  [place] [address] PCP: Jeremiah Stain, MD   Assessment & Plan: Visit Diagnoses:  1. Cervical spine disease   2. Other spondylosis with myelopathy, cervical region     Plan: With greater than 86-month history of falling gait disturbance and ambulation requiring a walker and significant spurring noted in his neck multilevel would recommend proceeding with cervical MRI scan to make sure he does not have cervical cord compression with his falling.  Office follow-up after scan for review.  Follow-Up Instructions: No follow-ups on file.   Orders:  No orders of the defined types were placed in this encounter.  No orders of the defined types were placed in this encounter.     Procedures: No procedures performed   Clinical Data: No additional findings.   Subjective: Chief Complaint  Patient presents with   Neck - Pain   Lower Back - Pain     HPI: 61 year old male lives alone and goes by "Smooth" had previously used a cane intermittently and is now having to use a rolling walker with reverse seat.  He is having problems with cooking cleaning has had some falls has abrasions over his right knee from falling and has noticed weakness in his arms also weakness in his legs.  2022 he had lumbar MRI that showed some foraminal stenosis at L3-4 and more pronounced at L4-5 without central stenosis.  Patient had is now slow deliberate gait not wide-based but states he has tingling and numbness in his feet and has had falling.  I had seen patient in November 2022 and at that time he had back pain and some leg pain but was ambulating without a cane or walker.  Review of Systems positive for seizures with previous CT scan head without acute changes.  Does have some sleep apnea,  hypertension bipolar disorder.  Recent falling and weakness upper and lower extremities with ambulation requiring a rolling walker.   Objective: Vital Signs: BP 120/78   Pulse 82   Ht 5\' 11"  (1.803 m)   Wt 197 lb (89.4 kg)   BMI 27.48 kg/m   Physical Exam Constitutional:      Appearance: He is well-developed.  HENT:     Head: Normocephalic and atraumatic.     Right Ear: External ear normal.     Left Ear: External ear normal.  Eyes:     Pupils: Pupils are equal, round, and reactive to light.  Neck:     Thyroid: No thyromegaly.     Trachea: No tracheal deviation.  Cardiovascular:     Rate and Rhythm: Normal rate.  Pulmonary:     Effort: Pulmonary effort is normal.     Breath sounds: No wheezing.  Abdominal:     General: Bowel sounds are normal.     Palpations: Abdomen is soft.  Musculoskeletal:     Cervical back: Neck supple.  Skin:    General: Skin is warm and dry.     Capillary Refill: Capillary refill takes less than 2 seconds.  Neurological:     Mental Status: He is alert and oriented to person, place, and time.  Psychiatric:  Behavior: Behavior normal.        Thought Content: Thought content normal.        Judgment: Judgment normal.   Patient pleasant conversant.  Ortho Exam patient has 3+ upper and lower extremities.  He has 2 beat clonus right and left foot.  Gait is extremely slow he can walk across the exam room without using his walker #short stride gait.  Anterior tib and gastrocsoleus show trace weakness both right and left.  Biceps triceps brachial radialis wrist flexion extension shows symmetrical trace weakness.  Specialty Comments:  No specialty comments available.  Imaging: Narrative & Impression  CLINICAL DATA:  Right-sided neck pain for 2 weeks   EXAM: CERVICAL SPINE - COMPLETE 4+ VIEW   COMPARISON:  None Available.   FINDINGS: The cervical spine is imaged through the C7 vertebral body in the lateral projection. Vertebral body  heights are preserved, without evidence of acute injury. Alignment is normal. There is marked disc space narrowing with bulky anterior osteophytes at C5-C6 and C6-C7 and to a lesser degree at C4-C5. There is overall mild multilevel neural foraminal stenosis without definite high-grade osseous foraminal narrowing.   The prevertebral soft tissues are unremarkable.   The imaged lung apices are clear.   IMPRESSION: 1. Disc space narrowing and bulky anterior osteophytes most notable at C5-C6 and C6-C7. 2. Normal alignment.  No acute finding.     Electronically Signed   By: Jeremiah Hunter M.D.   On: 09/09/2021 11:08       PMFS History: Patient Active Problem List   Diagnosis Date Noted   Other spondylosis with myelopathy, cervical region 10/10/2021   Cervical spine disease 10/03/2021   Urinary retention 09/04/2021   Leg cramps 06/29/2021   Aphthous ulcer 06/21/2021   Vitamin D deficiency 06/13/2021   Pain due to onychomycosis of toenails of both feet 03/13/2021   Itching 01/26/2021   Frequent falls 12/08/2020   Lumbar foraminal stenosis 12/07/2020   DDD (degenerative disc disease), lumbar    Prolapsed internal hemorrhoids, grade 3 09/25/2019   Meralgia paresthetica of left side 09/11/2019   Bipolar affective disorder, currently depressed, moderate ([place]) 09/11/2019   External hemorrhoids with complication 85/88/5027   Overactive bladder 06/11/2019   OSA (obstructive sleep apnea) 03/24/2019   Essential hypertension 03/24/2019   Seizure disorder ([place]) 03/24/2019   Past Medical History:  Diagnosis Date   Acute bilateral low back pain without sciatica 07/30/2019   Allergy    on meds   Anxiety    on meds   DDD (degenerative disc disease), lumbar    on meds   Enlarged prostate    GERD (gastroesophageal reflux disease)    on meds   Hypertension    on meds   Severe episode of recurrent major depressive disorder, with psychotic features ([place]) 03/24/2019   on meds   Sleep  apnea    uses CPAP    Family History  Family history unknown: Yes    Past Surgical History:  Procedure Laterality Date   CYSTECTOMY     DENTAL SURGERY     teeth pulled   HERNIA REPAIR     Social History   Occupational History   Not on file  Tobacco Use   Smoking status: Never   Smokeless tobacco: Never  Vaping Use   Vaping Use: Never used  Substance and Sexual Activity   Alcohol use: Never   Drug use: Never   Sexual activity: Not Currently

## 2021-10-10 NOTE — Addendum Note (Signed)
Addended by: Meyer Cory on: 10/10/2021 02:35 PM   Modules accepted: Orders

## 2021-10-12 ENCOUNTER — Telehealth: Payer: Self-pay

## 2021-10-12 NOTE — Telephone Encounter (Signed)
Request for an increase in PCS hours faxed to [facility]

## 2021-10-13 ENCOUNTER — Ambulatory Visit: Payer: Self-pay | Admitting: *Deleted

## 2021-10-13 NOTE — Telephone Encounter (Signed)
  Chief Complaint: fell 10/07/21 injured right knee requesting medication for pain and antibiotic Symptoms: reports falling 10/07/21 at night but does not remember the fall. Injured right knee, abrasion reported, red skin off of knee. Dark area around open wound , looks like scabbed area. No bleeding mild swelling . Severe pain , can walk with cane  Frequency: 10/07/21 Pertinent Negatives: Patient denies na  Disposition: [] ED /[x] Urgent Care (no appt availability in office) / [] Appointment(In office/virtual)/ []  [facility]/ [] Home Care/ [] Refused Recommended Disposition /[]  Mobile Bus/ [x]  Follow-up with PCP Additional Notes:   Recommended UC due to no appt. Patient reports he requires prior notification due to transportation issues. Please advise if OV can be scheduled after 4 p for transportation issues. Recommended to clean abrasion area with soap and water and pat dry. Can apply OTC neosporin ointment . Please advise .     Reason for Disposition  [1] SEVERE pain AND [2] not improved 2 hours after pain medicine/ice packs  Answer Assessment - Initial Assessment Questions 1. MECHANISM: "How did the injury happen?" (e.g., twisting injury, direct blow)      Not sure patient reports he thinks he fell at night  2. ONSET: "When did the injury happen?" (Minutes or hours ago)      Saturday 10/07/21 3. LOCATION: "Where is the injury located?"      Right knee 4. APPEARANCE of INJURY: "What does the injury look like?"      Red open abrasion. Dark in color to outside of wound, scabbed 5. SEVERITY: "Can you put weight on that leg?" "Can you walk?"      Yes but pain with walking  6. SIZE: For cuts, bruises, or swelling, ask: "How large is it?" (e.g., inches or centimeters;  entire joint)      Bruised right knee , swelling mild  7. PAIN: "Is there pain?" If Yes, ask: "How bad is the pain?"  "What does it keep you from doing?" (e.g., Scale 1-10; or mild, moderate, severe)   -  NONE:  (0): no pain   -  MILD (1-3): doesn't interfere with normal activities    -  MODERATE (4-7): interferes with normal activities (e.g., work or school) or awakens from sleep, limping    -  SEVERE (8-10): excruciating pain, unable to do any normal activities, unable to walk     Severe pain  8. TETANUS: For any breaks in the skin, ask: "When was the last tetanus booster?"     Na  9. OTHER SYMPTOMS: "Do you have any other symptoms?"  (e.g., "pop" when knee injured, swelling, locking, buckling)      Right knee injured with mild swelling  10. PREGNANCY: "Is there any chance you are pregnant?" "When was your last menstrual period?"       na  Protocols used: Knee Injury-A-AH

## 2021-10-14 NOTE — Telephone Encounter (Signed)
He will need an appt  I dont believe I have openings but can be added on if I do

## 2021-10-16 ENCOUNTER — Ambulatory Visit (INDEPENDENT_AMBULATORY_CARE_PROVIDER_SITE_OTHER): Payer: Medicaid Other

## 2021-10-16 ENCOUNTER — Ambulatory Visit (INDEPENDENT_AMBULATORY_CARE_PROVIDER_SITE_OTHER): Payer: Medicaid Other | Admitting: Podiatry

## 2021-10-16 DIAGNOSIS — R202 Paresthesia of skin: Secondary | ICD-10-CM | POA: Diagnosis not present

## 2021-10-16 DIAGNOSIS — M7671 Peroneal tendinitis, right leg: Secondary | ICD-10-CM | POA: Diagnosis not present

## 2021-10-16 DIAGNOSIS — M7751 Other enthesopathy of right foot: Secondary | ICD-10-CM | POA: Diagnosis not present

## 2021-10-16 DIAGNOSIS — R200 Anesthesia of skin: Secondary | ICD-10-CM | POA: Diagnosis not present

## 2021-10-16 NOTE — Progress Notes (Signed)
Subjective:   Patient ID: Jeremiah Hunter, male   DOB: 61 y.o.   MRN: [id number]   HPI Patient states that he has a lot of tingling and numbness in his foot and is getting a lot of aching in the outside of the right foot.  States that in the past injections had made a difference with patient having long-term history of neuropathy   ROS      Objective:  Physical Exam  I found the neurological to be diminished both sharp dull and vibratory that is been chronic in nature may be related to back issues with inflammation pain of the outside of the right foot around the fifth metatarsal base with fluid buildup     Assessment:  Acute peroneal tendinitis right most likely due to foot structure and walking with long-term chronic neuropathy     Plan:  H&P reviewed condition explained risk of injection patient wants injection.  Sterile prep did inject the peroneal sheath as it inserts base of fifth metatarsal 3 mg Dexasone Kenalog 5 mg Xylocaine applied sterile dressing  X-rays indicate no signs of bone appears to be a soft tissue pathology currently

## 2021-10-17 NOTE — Progress Notes (Signed)
Subjective:   Patient ID: Jeremiah Hunter, male   DOB: 61 y.o.   MRN: [id number]   HPI Patient states he still has the numbness and feels like it is worse since the previous injection stating that the pain in the outside of the foot is improved still slightly sore   ROS      Objective:  Physical Exam  Neurovascular status unchanged patient's had a history of neuropathy with numbness and I do not see where the injection would have exasperated this.  I do think it is more inflammatory in the heel and again he may have some kind of an issue as far as neuropathic changes which have been present for a fairly long time     Assessment:  Probability that this is his overall neuropathy which may be getting worse may be related to back issues also      Plan:  Discussed at great length and at this point organ to just put this on a watch she will use ice on the outside of the heel with supportive shoes and if neuropathy gets worse we will have to see a neurologist and at this point he wants to see whether or not I will tone.  All questions answered today

## 2021-10-18 NOTE — Telephone Encounter (Signed)
Called and scheduled appointment for next week.

## 2021-10-20 ENCOUNTER — Other Ambulatory Visit: Payer: Self-pay | Admitting: Podiatry

## 2021-10-20 DIAGNOSIS — M779 Enthesopathy, unspecified: Secondary | ICD-10-CM

## 2021-10-25 ENCOUNTER — Ambulatory Visit: Payer: Medicaid Other | Admitting: Critical Care Medicine

## 2021-10-25 NOTE — Progress Notes (Deleted)
Established Patient Office Visit  Subjective:  Patient ID: Tung Pustejovsky, male    DOB: [birth year]  Age: 61 y.o. MRN: [id number]  CC:  Assess PCS status   HPI 01/26/21  Tiron Suski is a 61 year old man who presents for primary care follow up today. His only acute complaint at this visit is some bodily itching, particularly on the right side of his neck and shoulder.   Mr. Radney has a history of low back pain and degenerative disc disease. He received an MRI for this and has completed physical therapy as recommended by orthopedics. He has joined a gym recently and is interested and motivated to begin exercising more frequently.   His hypertension is currently being managed with Amlodipine and he takes this daily. He denies taking home blood pressures at this time. His sleep apnea also remains well managed with the use of his CPAP machine at home.  Patient did mention that his mood has been low lately, as he has had multiple family members and friends pass away recently. He also has some worries surrounding his safety at his apartment.   06/13/21 This patient returns in follow-up and is complaining of some itching on the scalp and some cramps in the legs when walking.  On arrival blood pressure is good 132/84  Patient's been compliant with all his medications and his CPAP machine.  He does not have any new complaints he does need refills on all his medications at this time.  He has not had any recent falls.  9/12 Patient seen by way of a phone visit to assess status for personal care services currently is receiving services 2 hours a day 7 days a week with [facility].  They are going to come back in October for reassessment to see if he deserves more time.  He is having assistance with brushing his teeth other his activities of daily living.  Note since the last visit in May he has been to the emergency room for bladder cystitis and has been with urology at least once he received  course of antibiotics and tamsulosin he is taking tamsulosin finished antibiotics.  He has refills on all his medications.  He has had frequent falls.  He has an upcoming appointment with spine physician because he has lumbar spine disease and then saw Mcclung end of August and found to have cervical spine disease as well.  10/25/21   colon/ flu   Dutch Gray 9/19: Assessment & Plan: Visit Diagnoses:  1. Cervical spine disease   2. Other spondylosis with myelopathy, cervical region       Plan: With greater than 62-monthhistory of falling gait disturbance and ambulation requiring a walker and significant spurring noted in his neck multilevel would recommend proceeding with cervical MRI scan to make sure he does not have cervical cord compression with his falling.  Office follow-up after scan for review.>>> MRI 11/01/21  [facility]: Cystoscopy   Essential hypertension       Hypertension at goal no changes            Musculoskeletal and Integument    DDD (degenerative disc disease), lumbar      Patient with lumbar disc disease has follow-up with orthopedic spine told to keep this appointment            Other    Frequent falls      Continues to have unstable balance being [facility]  Cervical spine disease      X-ray showing involvement of the cervical spine suspect some of his hand symptoms are from same he will discuss this with orthopedics     Past Medical History:  Diagnosis Date   Acute bilateral low back pain without sciatica 07/30/2019   Allergy    on meds   Anxiety    on meds   DDD (degenerative disc disease), lumbar    on meds   Enlarged prostate    GERD (gastroesophageal reflux disease)    on meds   Hypertension    on meds   Severe episode of recurrent major depressive disorder, with psychotic features ([place]) 03/24/2019   on meds   Sleep apnea    uses CPAP    Past Surgical History:  Procedure Laterality Date   CYSTECTOMY      DENTAL SURGERY     teeth pulled   HERNIA REPAIR      Family History  Family history unknown: Yes    Social History   Socioeconomic History   Marital status: Single    Spouse name: Not on file   Number of children: Not on file   Years of education: Not on file   Highest education level: Not on file  Occupational History   Not on file  Tobacco Use   Smoking status: Never   Smokeless tobacco: Never  Vaping Use   Vaping Use: Never used  Substance and Sexual Activity   Alcohol use: Never   Drug use: Never   Sexual activity: Not Currently  Other Topics Concern   Not on file  Social History Narrative   Not on file   Social Determinants of Health   Financial Resource Strain: Not on file  Food Insecurity: Not on file  Transportation Needs: Not on file  Physical Activity: Not on file  Stress: Not on file  Social Connections: Not on file  Intimate Partner Violence: Not on file    Outpatient Medications Prior to Visit  Medication Sig Dispense Refill   amLODipine (NORVASC) 5 MG tablet TAKE 1 TABLET(5 MG) BY MOUTH DAILY 60 tablet 4   azelastine (ASTELIN) 0.1 % nasal spray Place 2 sprays into both nostrils 2 (two) times daily. Use in each nostril as directed 30 mL 12   B Complex-C-E-Zn (EQL STRESS B-COMPLEX C/ZINC) TABS One a day po. With food.     baclofen (LIORESAL) 10 MG tablet Take 10 mg by mouth 3 (three) times daily.     chlorhexidine (PERIDEX) 0.12 % solution Use as directed 15 mLs in the mouth or throat 2 (two) times daily. 120 mL 0   Cyanocobalamin (VITAMIN B-12 PO) Take 1 tablet by mouth daily at 6 (six) AM.     cyclobenzaprine (FLEXERIL) 10 MG tablet Take 1 tablet (10 mg total) by mouth 3 (three) times daily as needed for muscle spasms. 30 tablet 0   divalproex (DEPAKOTE ER) 500 MG 24 hr tablet Take 1 tablet (500 mg total) by mouth in the morning and at bedtime. 60 tablet 4   FLUoxetine (PROZAC) 20 MG capsule Take 1 capsule (20 mg total) by mouth daily. 60 capsule 2    fluticasone (FLONASE) 50 MCG/ACT nasal spray Place 2 sprays into both nostrils daily. 16 g 6   ibuprofen (ADVIL) 600 MG tablet Take 1 tablet (600 mg total) by mouth 3 (three) times daily. 30 tablet 0   lidocaine-prilocaine (EMLA) cream Apply 1 application topically as needed. 30 g 5  loratadine (CLARITIN) 10 MG tablet TAKE 1 TABLET(10 MG) BY MOUTH DAILY 30 tablet 4   ondansetron (ZOFRAN-ODT) 4 MG disintegrating tablet Take 1 tablet (4 mg total) by mouth every 8 (eight) hours as needed for nausea or vomiting. 12 tablet 0   OXYTROL 3.9 MG/24HR Apply patch two times a week 8 patch 2   phenazopyridine (PYRIDIUM) 200 MG tablet Take 200 mg by mouth 3 (three) times daily.     polyethylene glycol (MIRALAX) 17 g packet Take 17 g by mouth daily. 14 each 0   QUEtiapine (SEROQUEL) 100 MG tablet Take 1 tablet (100 mg total) by mouth at bedtime. 60 tablet 2   tamsulosin (FLOMAX) 0.4 MG CAPS capsule Take 0.4 mg by mouth at bedtime.     Vitamin D, Ergocalciferol, (DRISDOL) 1.25 MG (50000 UNIT) CAPS capsule Take 1 capsule (50,000 Units total) by mouth every 7 (seven) days. 12 capsule 5   No facility-administered medications prior to visit.    Allergies  Allergen Reactions   Losartan Itching    ROS Review of Systems  Constitutional: Negative.   HENT: Negative.  Negative for ear pain, postnasal drip, rhinorrhea, sinus pressure, sore throat, trouble swallowing and voice change.   Eyes: Negative.   Respiratory: Negative.  Negative for apnea, cough, choking, chest tightness, shortness of breath, wheezing and stridor.   Cardiovascular: Negative.  Negative for chest pain, palpitations and leg swelling.  Gastrointestinal: Negative.  Negative for abdominal distention, abdominal pain, nausea and vomiting.  Endocrine: Negative.   Genitourinary: Negative.   Musculoskeletal:  Positive for back pain. Negative for arthralgias and myalgias.  Skin:  Negative for rash.  Allergic/Immunologic: Negative.  Negative for  environmental allergies and food allergies.  Neurological: Negative.  Negative for dizziness, syncope, numbness and headaches. Weakness: frequent falls. Hematological: Negative.  Negative for adenopathy. Does not bruise/bleed easily.  Psychiatric/Behavioral:  Negative for agitation, dysphoric mood, self-injury, sleep disturbance and suicidal ideas. The patient is not nervous/anxious.       Objective:    No exam this was a phone visit  There were no vitals taken for this visit. Wt Readings from Last 3 Encounters:  10/10/21 197 lb (89.4 kg)  09/21/21 197 lb 12.8 oz (89.7 kg)  09/09/21 204 lb (92.5 kg)     Health Maintenance Due  Topic Date Due   COLON CANCER SCREENING ANNUAL FOBT  06/30/2021   INFLUENZA VACCINE  08/22/2021    There are no preventive care reminders to display for this patient.  Lab Results  Component Value Date   TSH 2.170 03/24/2019   Lab Results  Component Value Date   WBC 6.8 09/04/2021   HGB 13.3 09/04/2021   HCT 39.5 09/04/2021   MCV 85.3 09/04/2021   PLT 231 09/04/2021   Lab Results  Component Value Date   NA 135 09/04/2021   K 3.4 (L) 09/04/2021   CO2 26 09/04/2021   GLUCOSE 141 (H) 09/04/2021   BUN 15 09/04/2021   CREATININE 1.22 09/04/2021   BILITOT 0.6 09/04/2021   ALKPHOS 70 09/04/2021   AST 19 09/04/2021   ALT 11 09/04/2021   PROT 6.8 09/04/2021   ALBUMIN 3.7 09/04/2021   CALCIUM 8.9 09/04/2021   ANIONGAP 5 09/04/2021   EGFR 75 07/07/2021   Lab Results  Component Value Date   CHOL 145 06/13/2021   Lab Results  Component Value Date   HDL 59 06/13/2021   Lab Results  Component Value Date   LDLCALC 66 06/13/2021   Lab  Results  Component Value Date   TRIG 110 06/13/2021   Lab Results  Component Value Date   CHOLHDL 2.5 06/13/2021   No results found for: "HGBA1C"    Assessment & Plan:   Problem List Items Addressed This Visit   None No orders of the defined types were placed in this encounter. IFollow Up  Instructions:  Patient knows follow-up will occur I discussed the assessment and treatment plan with the patient. The patient was provided an opportunity to ask questions and all were answered. The patient agreed with the plan and demonstrated an understanding of the instructions.   The patient was advised to call back or seek an in-person evaluation if the symptoms worsen or if the condition fails to improve as anticipated.  I provided 30 minutes of non-face-to-face time during this encounter  including  median intraservice time , review of notes, labs, imaging, medications  and explaining diagnosis and management to the patient .    Asencion Noble, MD

## 2021-10-26 ENCOUNTER — Encounter: Payer: Self-pay | Admitting: Critical Care Medicine

## 2021-10-31 ENCOUNTER — Other Ambulatory Visit: Payer: Medicaid Other

## 2021-11-01 ENCOUNTER — Encounter: Payer: Self-pay | Admitting: Critical Care Medicine

## 2021-11-01 ENCOUNTER — Ambulatory Visit
Admission: RE | Admit: 2021-11-01 | Discharge: 2021-11-01 | Disposition: A | Discharge status: Home / Self Care | Payer: Medicaid Other | Source: Ambulatory Visit | Attending: Orthopaedic Surgery | Admitting: Orthopaedic Surgery

## 2021-11-01 DIAGNOSIS — M542 Cervicalgia: Secondary | ICD-10-CM

## 2021-11-01 DIAGNOSIS — M4712 Other spondylosis with myelopathy, cervical region: Secondary | ICD-10-CM

## 2021-11-01 NOTE — Progress Notes (Signed)
HPI M never smoker followed for OSA, complicated by  Hemorrhoids, HTN, Seizure Disorder, Meralgia Paresthetica, Bipolar, Anxiety Depression, NPSG 06/10/19- AHI 24.2/ hr, 78%, body weight today 240 lbs =======================================================   03/20/21-60 yoM never smoker followed for OSA, complicated by  Hemorrhoids, HTN, Seizure Disorder, Meralgia Paresthetica, Bipolar, Anxiety Depression, CPAP auto 5-12/ [facility]  ordered 10/30/19  AirSense 10 AutoSet Download- compliance 97%, AHI 1.3/ hr Body weight today-213 lbs Covid vax-1 J&J, 3 Phizer Flu vax-had Complains his FFM is uncomfortable. Discussed mask fitting and he requests this.  Denies health changes.  Indicates several family deaths over past year. Grieving.  11/02/21- 61 yoM never smoker followed for OSA, complicated by  Hemorrhoids, HTN, Seizure Disorder, Meralgia Paresthetica, Cervical Spine Disease,  Bipolar, Anxiety Depression, CPAP auto 5-12/ [facility]  ordered 10/30/19  AirSense 10 AutoSet Download- compliance 67%, AHI 2.1/ hr Body weight today-200 lbs Covid vax-1 J&J, 3 Phizer Flu vax-declines ------Pt f/u he says he is doing well. Pt seems to be having some confusion.  He had called in June and we had given permission then to stop using CPAP to see how he did without, pending f/u. After discussion of options now he chooses to update sleep study. Not 40 lb weight loss since his study in 2021.  In center sleep study preferred due to his hx of seizures and his mood disturbance.  ROS-see HPI   + = positive Constitutional:    weight loss, night sweats, fevers, chills, fatigue, lassitude. HEENT:   ++ headaches, difficulty swallowing,+ tooth/dental problems, sore throat,       sneezing, itching, ear ache, nasal congestion, post nasal drip, snoring CV:    chest pain, orthopnea, PND, swelling in lower extremities, anasarca,                                   dizziness, palpitations Resp:   +shortness of breath with  exertion or at rest.                productive cough,   non-productive cough, coughing up of blood.              change in color of mucus.  wheezing.   Skin:    rash or lesions. GI:  No-   heartburn, indigestion, abdominal pain, nausea, vomiting, diarrhea,                 change in bowel habits, loss of appetite GU: dysuria, change in color of urine, no urgency or frequency.   flank pain. MS:   joint pain, stiffness, decreased range of motion, back pain. Neuro-     nothing unusual Psych:  change in mood or affect.  +depression or +anxiety.   memory loss.  OBJ- Physical Exam General- Alert, Oriented, Affect-appropriate- Distress- none acute, + not obese Skin- rash-none, lesions- none, excoriation- none Lymphadenopathy- none Head- atraumatic            Eyes- Gross vision intact, PERRLA, conjunctivae and secretions clear            Ears- Hearing, canals-normal            Nose- Clear, no-Septal dev, mucus, polyps, erosion, perforation             Throat- Mallampati III , mucosa clear , drainage- none, tonsils- atrophic,  + teeth Neck- flexible , trachea midline, no stridor , thyroid nl, carotid no bruit Chest - symmetrical excursion ,  unlabored           Heart/CV- RRR , no murmur , no gallop  , no rub, nl s1 s2                           - JVD- none , edema- none, stasis changes- none, varices- none           Lung- clear to P&A, wheeze- none, cough- none , dullness-none, rub- none           Chest wall-  Abd-  Br/ Gen/ Rectal- Not done, not indicated Extrem- + cane Neuro- +no acute abnormality noted

## 2021-11-02 ENCOUNTER — Ambulatory Visit (INDEPENDENT_AMBULATORY_CARE_PROVIDER_SITE_OTHER): Payer: Medicaid Other | Admitting: Internal Medicine

## 2021-11-02 ENCOUNTER — Encounter: Payer: Self-pay | Admitting: Internal Medicine

## 2021-11-02 VITALS — BP 130/74 | HR 79 | Ht 71.0 in | Wt 200.6 lb

## 2021-11-02 DIAGNOSIS — F3132 Bipolar disorder, current episode depressed, moderate: Secondary | ICD-10-CM | POA: Diagnosis not present

## 2021-11-02 DIAGNOSIS — G40909 Epilepsy, unspecified, not intractable, without status epilepticus: Secondary | ICD-10-CM | POA: Diagnosis not present

## 2021-11-02 DIAGNOSIS — G4733 Obstructive sleep apnea (adult) (pediatric): Secondary | ICD-10-CM | POA: Diagnosis not present

## 2021-11-02 NOTE — Assessment & Plan Note (Signed)
is unusual but not overtly disturbed at this visit.

## 2021-11-02 NOTE — Assessment & Plan Note (Signed)
Controlled.  

## 2021-11-02 NOTE — Patient Instructions (Signed)
Order- schedule split night sleep study at sleep center    dx OSA, seizure disorder  You can call us for results about 2 weeks after your sleep study

## 2021-11-03 ENCOUNTER — Other Ambulatory Visit: Payer: Self-pay

## 2021-11-03 ENCOUNTER — Telehealth: Payer: Self-pay | Admitting: Internal Medicine

## 2021-11-03 NOTE — Telephone Encounter (Signed)
Sleep study order d/c 11/03/2021 @ 3:48pm

## 2021-11-03 NOTE — Telephone Encounter (Signed)
Ok thanks. We will see him in January. Holly please d/c the sleep study order

## 2021-11-03 NOTE — Addendum Note (Signed)
Addended by: Priscille Kluver on: 11/03/2021 03:47 PM   Modules accepted: Orders

## 2021-11-03 NOTE — Telephone Encounter (Signed)
FYI - I scheduled inlab study for patient and when I called him with appt info he stated he decided he didn't want to do study - Dr Annamaria Boots told him he didn't need to and he trusted his opinion.  He has appt for follow up in January and said he would see Dr Annamaria Boots then.

## 2021-11-08 ENCOUNTER — Telehealth: Payer: Self-pay | Admitting: Critical Care Medicine

## 2021-11-08 DIAGNOSIS — E876 Hypokalemia: Secondary | ICD-10-CM

## 2021-11-08 NOTE — Telephone Encounter (Signed)
Copied from [place] [id number]. Topic: General - Other >> Nov 08, 2021  1:58 PM Ja-Kwan M wrote: Reason for CRM: Pt stated he has ben taking medications and would like to request that labs be ordered. Cb# [phone]

## 2021-11-08 NOTE — Telephone Encounter (Signed)
Copied from CRM [id number]. Topic: General - Other >> Nov 08, 2021  1:58 PM Ja-Kwan M wrote: Reason for CRM: Pt stated he has ben taking medications and would like to request that labs be ordered. Cb# [phone] 

## 2021-11-10 NOTE — Telephone Encounter (Signed)
Lab order has been placed.  He can take apple cider if he would like to.

## 2021-11-10 NOTE — Addendum Note (Signed)
Addended by: Charlott Rakes on: 11/10/2021 04:14 PM   Modules accepted: Orders

## 2021-11-10 NOTE — Telephone Encounter (Signed)
Called patient and he stated that he would like lab (basic) to make sure everything is normal. Also stated that a Doctor told and a while ago that he could take apple cider, Is it ok for him to take this with his currents medication.

## 2021-11-13 NOTE — Telephone Encounter (Signed)
Called patient and he is aware of note and verbalized understanding

## 2021-11-17 ENCOUNTER — Ambulatory Visit: Payer: Self-pay

## 2021-11-17 ENCOUNTER — Encounter (HOSPITAL_COMMUNITY): Payer: Self-pay

## 2021-11-17 ENCOUNTER — Emergency Department (HOSPITAL_COMMUNITY): Payer: Medicaid Other

## 2021-11-17 ENCOUNTER — Emergency Department (HOSPITAL_COMMUNITY)
Admission: EM | Admit: 2021-11-17 | Discharge: 2021-11-17 | Disposition: A | Discharge status: Home / Self Care | Payer: Medicaid Other | Attending: Emergency Medicine | Admitting: Emergency Medicine

## 2021-11-17 DIAGNOSIS — M79602 Pain in left arm: Secondary | ICD-10-CM | POA: Insufficient documentation

## 2021-11-17 MED ORDER — ACETAMINOPHEN 500 MG PO TABS
1000.0000 mg | ORAL_TABLET | Freq: Once | ORAL | Status: AC
  Administered 2021-11-17: 1000 mg via ORAL
  Filled 2021-11-17: qty 2

## 2021-11-17 NOTE — ED Triage Notes (Signed)
Pt arrived via POV, c/o left arm pain, worsening with mvmt and palpation. Denies any injury to such.

## 2021-11-17 NOTE — Discharge Instructions (Signed)
You have been seen today for your complaint of left chest pain. Your imaging showed no abnormalities. Your discharge medications include Tylenol and ibuprofen.  You may alternate these every 4 hours as needed for pain.  You should follow dosing instructions on the bottle. Follow up with: Your primary care provider in 1 week Please seek immediate medical care if you develop any of the following symptoms: You have numbness or tingling in the injured area. You lose a lot of strength in the injured area. At this time there does not appear to be the presence of an emergent medical condition, however there is always the potential for conditions to change. Please read and follow the below instructions.  Do not take your medicine if  develop an itchy rash, swelling in your mouth or lips, or difficulty breathing; call 911 and seek immediate emergency medical attention if this occurs.  You may review your lab tests and imaging results in their entirety on your MyChart account.  Please discuss all results of fully with your primary care provider and other specialist at your follow-up visit.  Note: Portions of this text may have been transcribed using voice recognition software. Every effort was made to ensure accuracy; however, inadvertent computerized transcription errors may still be present.

## 2021-11-17 NOTE — ED Provider Notes (Signed)
[facility]-EMERGENCY DEPT Provider Note   CSN: [id number] Arrival date & time: 11/17/21  1510     History  Chief Complaint  Patient presents with   Arm Pain    Jeremiah Hunter is a 61 y.o. male.  Who presents ED for evaluation of left tricep pain.  States pain started at approximately 9 AM this morning.  No inciting injury.  States he just noticed the pain gradually increased.  Currently rating the pain at an 8 out of 10.  Worse with elbow extension.  Describes the pain as aching.  No numbness, weakness or tingling.  States he does have similar pains occasionally in his right upper extremity.   Arm Pain       Home Medications Prior to Admission medications   Medication Sig Start Date End Date Taking? Authorizing Provider  amLODipine (NORVASC) 5 MG tablet TAKE 1 TABLET(5 MG) BY MOUTH DAILY 06/13/21   Storm Frisk, MD  azelastine (ASTELIN) 0.1 % nasal spray Place 2 sprays into both nostrils 2 (two) times daily. Use in each nostril as directed 05/17/21   Storm Frisk, MD  B Complex-C-E-Zn (EQL STRESS B-COMPLEX C/ZINC) TABS One a day po. With food. 09/11/19   Dohmeier, Porfirio Mylar, MD  baclofen (LIORESAL) 10 MG tablet Take 10 mg by mouth 3 (three) times daily. 09/02/21   [provider]  chlorhexidine (PERIDEX) 0.12 % solution Use as directed 15 mLs in the mouth or throat 2 (two) times daily. 06/21/21   Storm Frisk, MD  Cyanocobalamin (VITAMIN B-12 PO) Take 1 tablet by mouth daily at 6 (six) AM.    [provider]  cyclobenzaprine (FLEXERIL) 10 MG tablet Take 1 tablet (10 mg total) by mouth 3 (three) times daily as needed for muscle spasms. 08/02/21   Mayers, Cari S, PA-C  divalproex (DEPAKOTE ER) 500 MG 24 hr tablet Take 1 tablet (500 mg total) by mouth in the morning and at bedtime. 06/13/21   Storm Frisk, MD  FLUoxetine (PROZAC) 20 MG capsule Take 1 capsule (20 mg total) by mouth daily. 06/13/21   Storm Frisk, MD  fluticasone  (FLONASE) 50 MCG/ACT nasal spray Place 2 sprays into both nostrils daily. 09/26/21   Storm Frisk, MD  ibuprofen (ADVIL) 600 MG tablet Take 1 tablet (600 mg total) by mouth 3 (three) times daily. 08/26/21   Ellsworth Lennox, PA-C  lidocaine-prilocaine (EMLA) cream Apply 1 application topically as needed. 09/27/20   Storm Frisk, MD  loratadine (CLARITIN) 10 MG tablet TAKE 1 TABLET(10 MG) BY MOUTH DAILY 02/18/21   Storm Frisk, MD  ondansetron (ZOFRAN-ODT) 4 MG disintegrating tablet Take 1 tablet (4 mg total) by mouth every 8 (eight) hours as needed for nausea or vomiting. 08/10/21   Rising, Lurena Joiner, PA-C  OXYTROL 3.9 MG/24HR Apply patch two times a week 09/21/21   Anders Simmonds, PA-C  phenazopyridine (PYRIDIUM) 200 MG tablet Take 200 mg by mouth 3 (three) times daily. 09/18/21   [provider]  polyethylene glycol (MIRALAX) 17 g packet Take 17 g by mouth daily. 09/01/21   Glyn Ade, MD  QUEtiapine (SEROQUEL) 100 MG tablet Take 1 tablet (100 mg total) by mouth at bedtime. 06/13/21   Storm Frisk, MD  tamsulosin (FLOMAX) 0.4 MG CAPS capsule Take 0.4 mg by mouth at bedtime. 09/18/21   [provider]  Vitamin D, Ergocalciferol, (DRISDOL) 1.25 MG (50000 UNIT) CAPS capsule Take 1 capsule (50,000 Units total) by mouth every  7 (seven) days. 06/14/21   Storm Frisk, MD      Allergies    Losartan    Review of Systems   Review of Systems  Musculoskeletal:  Positive for myalgias.  All other systems reviewed and are negative.   Physical Exam Updated Vital Signs Temp 98.7 F (37.1 C) (Oral)  Physical Exam Vitals and nursing note reviewed.  Constitutional:      General: He is not in acute distress.    Appearance: Normal appearance. He is normal weight. He is not ill-appearing.  HENT:     Head: Normocephalic and atraumatic.  Pulmonary:     Effort: Pulmonary effort is normal. No respiratory distress.  Abdominal:     General: Abdomen is flat.   Musculoskeletal:        General: Normal range of motion.     Right upper arm: Normal.     Left upper arm: No tenderness or bony tenderness.     Cervical back: Neck supple.     Comments: Left upper extremity normal to inspection.  Normal range of motion.  Sensation intact.  Cap refill and radial pulses normal.  Strength intact.  Skin:    General: Skin is warm and dry.  Neurological:     Mental Status: He is alert and oriented to person, place, and time.  Psychiatric:        Mood and Affect: Mood normal.        Behavior: Behavior normal.     ED Results / Procedures / Treatments   Labs (all labs ordered are listed, but only abnormal results are displayed) Labs Reviewed - No data to display  EKG None  Radiology DG Humerus Left  Result Date: 11/17/2021 CLINICAL DATA:  Left arm pain, worse with movement and palpation EXAM: LEFT HUMERUS - 2+ VIEW COMPARISON:  None Available. FINDINGS: Frontal and lateral views of the left humerus are obtained. No acute fracture. Alignment of the left shoulder and elbow is anatomic. Soft tissues are normal. IMPRESSION: 1. Unremarkable left humerus. Electronically Signed   By: Sharlet Salina M.D.   On: 11/17/2021 16:36    Procedures Procedures    Medications Ordered in ED Medications  acetaminophen (TYLENOL) tablet 1,000 mg (1,000 mg Oral Given 11/17/21 1553)    ED Course/ Medical Decision Making/ A&P                           Medical Decision Making Amount and/or Complexity of Data Reviewed Radiology: ordered.  Risk OTC drugs.  This patient presents to the ED for concern of left tricep pain, this involves an extensive number of treatment options, and is a complaint that carries with it a high risk of complications and morbidity.  The differential diagnosis includes muscle strain, sprain, fracture  My initial workup includes p.o. Tylenol and x-ray  Additional history obtained from: Nursing notes from this visit.  I ordered imaging  studies including x-ray left humerus I independently visualized and interpreted imaging which showed normal I agree with the radiologist interpretation  Afebrile, hemodynamically stable.  Patient is a 61 year old male who presents ED for evaluation of left tricep pain.  Initially presented to the ED because he talked to his primary care provider who then transferred him to the triage nurse who had concern for ACS due to left arm pain, however the pain is localized to the left tricep and predictably worsens with movement.  No chest pain or shortness of breath.  No inciting injury.  Neurovascular status intact.  Full range of motion.  X-ray normal.  Pain improved with p.o. Tylenol.  Likely has muscle strain of the left tricep.  Advised patient to alternate Tylenol and ibuprofen at home for pain.  Also advised him to ice today.  He may alternate heat and ice starting tomorrow.  Stable discharge.  At this time there does not appear to be any evidence of an acute emergency medical condition and the patient appears stable for discharge with appropriate outpatient follow up. Diagnosis was discussed with patient who verbalizes understanding of care plan and is agreeable to discharge. I have discussed return precautions with patient who verbalizes understanding. Patient encouraged to follow-up with their PCP within 1 week. All questions answered.  Note: Portions of this report may have been transcribed using voice recognition software. Every effort was made to ensure accuracy; however, inadvertent computerized transcription errors may still be present.          Final Clinical Impression(s) / ED Diagnoses Final diagnoses:  Left arm pain    Rx / DC Orders ED Discharge Orders     None         Roylene Reason, Hershal Coria 11/17/21 1803    Sherwood Gambler, MD 11/17/21 1810

## 2021-11-17 NOTE — Telephone Encounter (Signed)
  Chief Complaint: Left arm pit pain Symptoms: Pain in left arm pit 8/10 Frequency: Since 9am Pertinent Negatives: Patient denies Fever, but states he does feel warm Disposition: [x] ED /[] Urgent Care (no appt availability in office) / [] Appointment(In office/virtual)/ []  [facility]/ [] Home Care/ [] Refused Recommended Disposition /[] [facility]/ []  Follow-up with PCP Additional Notes: Pt states that his left arm pit started hurting about 9 am today without any known cause. Pt states he does feel hot. Originally recommended UC, called pt back and recommended ED.    Reason for Disposition  [1] Age > 40 AND [2] no obvious cause AND [3] pain even when not moving the arm  (Exception: Pain is clearly made worse by moving arm or bending neck.)  Answer Assessment - Initial Assessment Questions 1. ONSET: "When did the pain start?"     9 am this morning 2. LOCATION: "Where is the pain located?"     Left arm arm pit 3. PAIN: "How bad is the pain?" (Scale 1-10; or mild, moderate, severe)   - MILD (1-3): Doesn't interfere with normal activities.   - MODERATE (4-7): Interferes with normal activities (e.g., work or school) or awakens from sleep.   - SEVERE (8-10): Excruciating pain, unable to do any normal activities, unable to hold a cup of water.     8-10/10 4. WORK OR EXERCISE: "Has there been any recent work or exercise that involved this part of the body?"      5. CAUSE: "What do you think is causing the arm pain?"     nope 6. OTHER SYMPTOMS: "Do you have any other symptoms?" (e.g., neck pain, swelling, rash, fever, numbness, weakness)     Feeling hot 7. PREGNANCY: "Is there any chance you are pregnant?" "When was your last menstrual period?"  Protocols used: Arm Pain-A-AH

## 2021-11-20 NOTE — Telephone Encounter (Signed)
Patient was seen in ED for further evaluation

## 2021-11-22 ENCOUNTER — Encounter: Payer: Self-pay | Admitting: Orthopaedic Surgery

## 2021-11-24 ENCOUNTER — Encounter: Payer: Self-pay | Admitting: Internal Medicine

## 2021-11-24 ENCOUNTER — Ambulatory Visit: Payer: Medicaid Other | Admitting: Orthopaedic Surgery

## 2021-11-24 ENCOUNTER — Ambulatory Visit: Payer: Medicaid Other | Attending: Internal Medicine | Admitting: Internal Medicine

## 2021-11-24 VITALS — BP 112/78 | HR 98 | Temp 98.0°F | Ht 71.0 in | Wt 199.8 lb

## 2021-11-24 DIAGNOSIS — E876 Hypokalemia: Secondary | ICD-10-CM | POA: Insufficient documentation

## 2021-11-24 DIAGNOSIS — R296 Repeated falls: Secondary | ICD-10-CM | POA: Insufficient documentation

## 2021-11-24 DIAGNOSIS — M5136 Other intervertebral disc degeneration, lumbar region: Secondary | ICD-10-CM | POA: Diagnosis not present

## 2021-11-24 DIAGNOSIS — G40909 Epilepsy, unspecified, not intractable, without status epilepticus: Secondary | ICD-10-CM | POA: Diagnosis not present

## 2021-11-24 DIAGNOSIS — M4802 Spinal stenosis, cervical region: Secondary | ICD-10-CM | POA: Diagnosis not present

## 2021-11-24 DIAGNOSIS — M489 Spondylopathy, unspecified: Secondary | ICD-10-CM | POA: Diagnosis not present

## 2021-11-24 DIAGNOSIS — Z029 Encounter for administrative examinations, unspecified: Secondary | ICD-10-CM

## 2021-11-24 DIAGNOSIS — G4733 Obstructive sleep apnea (adult) (pediatric): Secondary | ICD-10-CM | POA: Insufficient documentation

## 2021-11-24 DIAGNOSIS — F319 Bipolar disorder, unspecified: Secondary | ICD-10-CM | POA: Insufficient documentation

## 2021-11-24 DIAGNOSIS — M48061 Spinal stenosis, lumbar region without neurogenic claudication: Secondary | ICD-10-CM | POA: Diagnosis not present

## 2021-11-24 DIAGNOSIS — R269 Unspecified abnormalities of gait and mobility: Secondary | ICD-10-CM | POA: Insufficient documentation

## 2021-11-24 DIAGNOSIS — R531 Weakness: Secondary | ICD-10-CM | POA: Insufficient documentation

## 2021-11-24 NOTE — Progress Notes (Signed)
Patient ID: Jeremiah Hunter, male    DOB: 03-16-1960  MRN: [id number]  CC: Form Completion (Patient states he need forms or letter stating his limitations. Pt states Dr.Wright previosuly wrote a letter and he needs that again to appeal the medicaid's decision.)   Subjective: Jeremiah Hunter is a 61 y.o. male who presents for UC visit.  PCP is Dr. Joya Gaskins His concerns today include:  Patient with history of DDD lumbar spine, frequent falls, seizure disorder, OSA, bipolar affective disorder, cervical spinal stenosis  Patient is requesting a note to help with him appealing decision to get his PCS services reinstated.  Patient was receiving PCS services 7 days a week for 2 hours with [place].  He recently had an independent assessment by a registered nurse who came out to his house and found that he did not qualify for hands-on assistance with  3 qualifying activities of daily living or with 2 ADLs including 1 with extensive or greater need for assistance.  He has his aide participating in this visit via phone.  Her name is Ms. Portia  Patient has history of degenerative lumbar disease, cervical spinal stenosis with recurrent falls.  He ambulates with a cane and sometimes with his walker.  Last fall occurred last week with no injuries.  He states that he had gotten up from his chair using his cane and fell.  He was recently assessed by Dr. Lorin Mercy for cervical spine disease with falling and gait disturbance.  X-ray of the neck revealed osteoarthritis changes.  Recent MRI findings were IMPRESSION: 1. C3-4 cord signal abnormality. Spinal stenosis is mild at this level but there is severe left facet degeneration and mild anterolisthesis. 2. Degenerative foraminal impingement bilaterally at C3-4, C5-6 and on the right at C6-7.  -Today he tells me that he has weakness in his hands right greater than left.  Intermittently drops objects.  Needs help with meal prep but he is able to feed  himself.  Needs assistance with bathing due to pain in arms and weakness in right hand.  Has a shower and shower bench but is afraid to get in the shower due to recurrent falls.  Aide assist him with bedside baths.  Able to get on and off the toilet but 50% of the times he is unable to make it there in time due to gait disturbance so he wears depends.  Needs assistance with dressing himself.  Due to weakness in his hands, he is unable to put on his pants without the assistance of his aide or a family member like his sister who comes to assist at times.  He can put on his shirt but unable to button them up.  Patient Active Problem List   Diagnosis Date Noted   Other spondylosis with myelopathy, cervical region 10/10/2021   Cervical spine disease 10/03/2021   Urinary retention 09/04/2021   Leg cramps 06/29/2021   Aphthous ulcer 06/21/2021   Vitamin D deficiency 06/13/2021   Pain due to onychomycosis of toenails of both feet 03/13/2021   Itching 01/26/2021   Frequent falls 12/08/2020   Lumbar foraminal stenosis 12/07/2020   DDD (degenerative disc disease), lumbar    Prolapsed internal hemorrhoids, grade 3 09/25/2019   Meralgia paresthetica of left side 09/11/2019   Bipolar affective disorder, currently depressed, moderate ([place]) 09/11/2019   External hemorrhoids with complication 76/28/3151   Overactive bladder 06/11/2019   OSA (obstructive sleep apnea) 03/24/2019   Essential hypertension 03/24/2019  Seizure disorder (HCC) 03/24/2019     Current Outpatient Medications on File Prior to Visit  Medication Sig Dispense Refill   amLODipine (NORVASC) 5 MG tablet TAKE 1 TABLET(5 MG) BY MOUTH DAILY 60 tablet 4   azelastine (ASTELIN) 0.1 % nasal spray Place 2 sprays into both nostrils 2 (two) times daily. Use in each nostril as directed 30 mL 12   B Complex-C-E-Zn (EQL STRESS B-COMPLEX C/ZINC) TABS One a day po. With food.     baclofen (LIORESAL) 10 MG tablet Take 10 mg by mouth 3 (three)  times daily.     chlorhexidine (PERIDEX) 0.12 % solution Use as directed 15 mLs in the mouth or throat 2 (two) times daily. 120 mL 0   Cyanocobalamin (VITAMIN B-12 PO) Take 1 tablet by mouth daily at 6 (six) AM.     cyclobenzaprine (FLEXERIL) 10 MG tablet Take 1 tablet (10 mg total) by mouth 3 (three) times daily as needed for muscle spasms. 30 tablet 0   divalproex (DEPAKOTE ER) 500 MG 24 hr tablet Take 1 tablet (500 mg total) by mouth in the morning and at bedtime. 60 tablet 4   FLUoxetine (PROZAC) 20 MG capsule Take 1 capsule (20 mg total) by mouth daily. 60 capsule 2   fluticasone (FLONASE) 50 MCG/ACT nasal spray Place 2 sprays into both nostrils daily. 16 g 6   ibuprofen (ADVIL) 600 MG tablet Take 1 tablet (600 mg total) by mouth 3 (three) times daily. 30 tablet 0   lidocaine-prilocaine (EMLA) cream Apply 1 application topically as needed. 30 g 5   ondansetron (ZOFRAN-ODT) 4 MG disintegrating tablet Take 1 tablet (4 mg total) by mouth every 8 (eight) hours as needed for nausea or vomiting. 12 tablet 0   OXYTROL 3.9 MG/24HR Apply patch two times a week 8 patch 2   phenazopyridine (PYRIDIUM) 200 MG tablet Take 200 mg by mouth 3 (three) times daily.     polyethylene glycol (MIRALAX) 17 g packet Take 17 g by mouth daily. 14 each 0   QUEtiapine (SEROQUEL) 100 MG tablet Take 1 tablet (100 mg total) by mouth at bedtime. 60 tablet 2   tamsulosin (FLOMAX) 0.4 MG CAPS capsule Take 0.4 mg by mouth at bedtime.     Vitamin D, Ergocalciferol, (DRISDOL) 1.25 MG (50000 UNIT) CAPS capsule Take 1 capsule (50,000 Units total) by mouth every 7 (seven) days. 12 capsule 5   loratadine (CLARITIN) 10 MG tablet TAKE 1 TABLET(10 MG) BY MOUTH DAILY (Patient not taking: Reported on 11/24/2021) 30 tablet 4   No current facility-administered medications on file prior to visit.    Allergies  Allergen Reactions   Losartan Itching    Social History   Socioeconomic History   Marital status: Single    Spouse name:  Not on file   Number of children: Not on file   Years of education: Not on file   Highest education level: Not on file  Occupational History   Not on file  Tobacco Use   Smoking status: Never   Smokeless tobacco: Never  Vaping Use   Vaping Use: Never used  Substance and Sexual Activity   Alcohol use: Never   Drug use: Never   Sexual activity: Not Currently  Other Topics Concern   Not on file  Social History Narrative   Not on file   Social Determinants of Health   Financial Resource Strain: Not on file  Food Insecurity: Not on file  Transportation Needs: Not on file  Physical Activity: Not on file  Stress: Not on file  Social Connections: Not on file  Intimate Partner Violence: Not on file    Family History  Family history unknown: Yes    Past Surgical History:  Procedure Laterality Date   CYSTECTOMY     DENTAL SURGERY     teeth pulled   HERNIA REPAIR      ROS: Review of Systems Negative except as stated above  PHYSICAL EXAM: BP 112/78   Pulse 98   Temp 98 F (36.7 C) (Temporal)   Ht 5\' 11"  (1.803 m)   Wt 199 lb 12.8 oz (90.6 kg)   SpO2 97%   BMI 27.87 kg/m   Physical Exam  General appearance - alert, well appearing, older African-American male and in no distress.  He is noted to be wearing a shirt with buttons on it but the sure does not buttoned up exposing his chest.  He told me that he is unable to button his shirt due to weakness in his hands. Mental status -patient is a bit forgetful and is a poor historian Neurological -patient gait is slow with low foot to floor clearance.  He has a shuffling gait and ambulates with a cane.  Grip: 3+ bilaterally.  However I question patient's effort.  No significant wasting of hand muscles noted.  Power in the upper extremities proximally and distally 4/5 bilaterally.  Patient gives to pain.  Power in the lower extremities 4/5 bilaterally proximally and distally. Patient has moderate discomfort with passive range  of motion of both shoulders.    Latest Ref Rng & Units 09/04/2021    4:11 PM 09/01/2021   11:12 AM 07/07/2021    9:00 AM  CMP  Glucose 70 - 99 mg/dL 07/09/2021  95  79   BUN 6 - 20 mg/dL 15  10  13    Creatinine 0.61 - 1.24 mg/dL 400   8.67   Sodium 135 - 145 mmol/L 135  140  139   Potassium 3.5 - 5.1 mmol/L 3.4  3.7  3.7   Chloride 98 - 111 mmol/L 104  107  102   CO2 22 - 32 mmol/L 26  27  24    Calcium 8.9 - 10.3 mg/dL 8.9  9.3  9.1   Total Protein 6.5 - 8.1 g/dL 6.8     Total Bilirubin 0.3 - 1.2 mg/dL 0.6     Alkaline Phos 38 - 126 U/L 70     AST 15 - 41 U/L 19     ALT 0 - 44 U/L 11      Lipid Panel     Component Value Date/Time   CHOL 145 06/13/2021 1534   TRIG 110 06/13/2021 1534   HDL 59 06/13/2021 1534   CHOLHDL 2.5 06/13/2021 1534   LDLCALC 66 06/13/2021 1534    CBC    Component Value Date/Time   WBC 6.8 09/04/2021 1611   RBC 4.63 09/04/2021 1611   HGB 13.3 09/04/2021 1611   HGB 14.3 06/23/2020 1128   HCT 39.5 09/04/2021 1611   HCT 43.5 06/23/2020 1128   PLT 231 09/04/2021 1611   PLT 218 06/23/2020 1128   MCV 85.3 09/04/2021 1611   MCV 88 06/23/2020 1128   MCH 28.7 09/04/2021 1611   MCHC 33.7 09/04/2021 1611   RDW 15.2 09/04/2021 1611   RDW 14.1 06/23/2020 1128   LYMPHSABS 1.8 09/04/2021 1611   LYMPHSABS 1.9 06/23/2020 1128   MONOABS 0.5 09/04/2021 1611  EOSABS 0.1 09/04/2021 1611   EOSABS 0.1 06/23/2020 1128   BASOSABS 0.0 09/04/2021 1611   BASOSABS 0.0 06/23/2020 1128    ASSESSMENT AND PLAN:  1. Administrative encounter 2. Gait disturbance 3. Recurrent falls 4. Cervical spine disease 5. Lumbar foraminal stenosis -Advised patient that I can write a letter in support of him getting PCS services reinstated as he needs help with clothing, meal preps and bathing.  However I told him that there is no guarantee that it would be approved.  He expressed understanding.  Message will be sent to our caseworker.  6. Hypokalemia BMP was ordered by Dr. Alvis Lemmings  last month.  Patient request to have this drawn today while he was here. - Basic Metabolic Panel    Patient was given the opportunity to ask questions.  Patient verbalized understanding of the plan and was able to repeat key elements of the plan.   This documentation was completed using Paediatric nurse.  Any transcriptional errors are unintentional.  No orders of the defined types were placed in this encounter.    Requested Prescriptions    No prescriptions requested or ordered in this encounter    No follow-ups on file.  Jonah Blue, MD, FACP

## 2021-11-25 LAB — BASIC METABOLIC PANEL
BUN/Creatinine Ratio: 12 (ref 9–20)
BUN: 18 mg/dL (ref 6–24)
CO2: 24 mmol/L (ref 20–29)
Calcium: 9 mg/dL (ref 8.7–10.2)
Chloride: 106 mmol/L (ref 96–106)
Creatinine, Ser: 1.53 mg/dL — ABNORMAL HIGH (ref 0.76–1.27)
Glucose: 100 mg/dL — ABNORMAL HIGH (ref 70–99)
Potassium: 4.2 mmol/L (ref 3.5–5.2)
Sodium: 143 mmol/L (ref 134–144)
eGFR: 52 mL/min/{1.73_m2} — ABNORMAL LOW (ref >59)

## 2021-11-27 ENCOUNTER — Encounter: Payer: Self-pay | Admitting: Critical Care Medicine

## 2021-11-28 ENCOUNTER — Telehealth: Payer: Self-pay

## 2021-11-28 NOTE — Telephone Encounter (Signed)
I contacted the patient regarding his appeal of the denial of PCS. Dr Wynetta Emery has composed a letter supporting his need for the services.   He said that with someone's help, he contacted [facility] DHHS office about the appeal.  He said that he submitted his request for appeal over the phone and was told that they would call him back to discuss the denial after receiving the letter from his doctor. He stated that he did not have to submit the hearing request form in writing. I explained to him that I would fax the letter to th Office of Administrative Hearings [facility]) as he has requested.  He was appreciative and did not think that any other information was needed at this time.  Letter from Dr Wynetta Emery faxed to [facility]- attention [employer],

## 2021-11-29 ENCOUNTER — Encounter: Payer: Self-pay | Admitting: Critical Care Medicine

## 2021-11-30 ENCOUNTER — Ambulatory Visit: Payer: Self-pay | Admitting: *Deleted

## 2021-11-30 ENCOUNTER — Telehealth: Payer: Self-pay

## 2021-11-30 DIAGNOSIS — Z1211 Encounter for screening for malignant neoplasm of colon: Secondary | ICD-10-CM

## 2021-11-30 NOTE — Telephone Encounter (Signed)
Reason for Disposition  [1] MILD-MODERATE pain AND [2] constant and [3] present < 2 hours    This call ended up being for obtaining personal care services for pt.   Jeremiah Hunter got on the line and they need to talk with Jeremiah Squibb B. At [facility]. He did mention having a weird feeling in his stomach but then the conversation changed over to personal care services.  Answer Assessment - Initial Assessment Questions 1. LOCATION: "Where does it hurt?"      My stomach feels funny.   It was in the front and now to left side.    2. RADIATION: "Does the pain shoot anywhere else?" (e.g., chest, back)     To left side.   I wake up in morning I feel a pain in my stomach.   3. ONSET: "When did the pain begin?" (Minutes, hours or days ago)      Over a week My head hurts too.   Suddenly there were several people n the line.  This lady wants information.  I'm the home care agency that takes care of him.  He is trying to get qualified for home care services.   [name] at [phone] would like for [name] To give her a call.  Jeremiah Squibb B. Has a note in the chart where she is working on this).      4. SUDDEN: "Gradual or sudden onset?"      5. PATTERN "Does the pain come and go, or is it constant?"    - If it comes and goes: "How long does it last?" "Do you have pain now?"     (Note: Comes and goes means the pain is intermittent. It goes away completely between bouts.)    - If constant: "Is it getting better, staying the same, or getting worse?"      (Note: Constant means the pain never goes away completely; most serious pain is constant and gets worse.)       6. SEVERITY: "How bad is the pain?"  (e.g., Scale 1-10; mild, moderate, or severe)    - MILD (1-3): Doesn't interfere with normal activities, abdomen soft and not tender to touch.     - MODERATE (4-7): Interferes with normal activities or awakens from sleep, abdomen tender to touch.     - SEVERE (8-10): Excruciating pain,  doubled over, unable to do any normal activities.        7. RECURRENT SYMPTOM: "Have you ever had this type of stomach pain before?" If Yes, ask: "When was the last time?" and "What happened that time?"       8. CAUSE: "What do you think is causing the stomach pain?"      9. RELIEVING/AGGRAVATING FACTORS: "What makes it better or worse?" (e.g., antacids, bending or twisting motion, bowel movement)      10. OTHER SYMPTOMS: "Do you have any other symptoms?" (e.g., back pain, diarrhea, fever, urination pain, vomiting)  Protocols used: Abdominal Pain - Male-A-AH

## 2021-11-30 NOTE — Telephone Encounter (Signed)
Unable to reach pt. Routing to provider for resolution per protocol.

## 2021-11-30 NOTE — Telephone Encounter (Signed)
Attempted to reach pt, VM full, could not leave message to call back.

## 2021-11-30 NOTE — Telephone Encounter (Signed)
Call received from patient requesting a copy of the letter sent to support his need for PCS. I explained to him that he should be able to access this letter in his MyChart under documents or letters.  I received a message from Ms Blase Mess regarding PCS and I returned her call.  She explained that her name is Daniel Nones and she works for [facility] who provide his PCS.  She stated that he really needs to help and they have a telephone call scheduled for 12/07/2021 to appeal the denial of PCS.  She was inquiring about the letter needed from the doctor and I told her that I have discussed this with the patient and he is aware that I have already sent the letter to [facility] DHHS- Office of Administrative Hearings and he can access it in his MyChart.

## 2021-11-30 NOTE — Telephone Encounter (Signed)
  Chief Complaint: The call started out as pt. C/o "his stomach feeling weird".  He was telling me about that when suddenly there were 2 other voices that came on the line.  That ended the triage of the stomach issue.   Ms. Blase Mess introduced herself as the one that is helping him get personal care services set up.   As she was telling me about getting him set up I let her know they needed to talk with Erskine Squibb B. From [facility] because there is a note in his chart from her where she is working on getting these services set up for him.   I let them know I would send a message to [facility] for Erskine Squibb to call her back.   Ms. Blase Mess was agreeable to this plan.   She can be reached at [phone].  I sent this message to [facility] for Robyne Peers to call Ms. Shipman.    Symptoms: C/o "weird funny feeling in stomach" then conversation switched to getting home care services for him Frequency: N/A Pertinent Negatives: Patient denies N/A Disposition: [] ED /[] Urgent Care (no appt availability in office) / [] Appointment(In office/virtual)/ []  [facility]/ [] Home Care/ [] Refused Recommended Disposition /[] [place] [facility]/ [x]  Follow-up with PCP Additional Notes: Notes sent to [facility] and wellness for  .

## 2021-11-30 NOTE — Telephone Encounter (Signed)
Second attempt to reach pt, VM ful, cannot leave message to call back.

## 2021-11-30 NOTE — Telephone Encounter (Signed)
  Patient called in for advice he has funny felling in head he says. He doesn't have pain he says, just funny feeling.

## 2021-12-01 NOTE — Telephone Encounter (Signed)
Please advise 

## 2021-12-01 NOTE — Telephone Encounter (Signed)
Sending information for mobile unit for Monday through [facility]

## 2021-12-01 NOTE — Telephone Encounter (Signed)
Copied from CRM [id number]. Topic: General - Other >> Dec 01, 2021 10:41 AM Everette C wrote: Reason for CRM: The patient would like for their information (Previously discussed with J Brazeau) to be sent to them at   P. Shipman   [email or web address]  Phone [phone]   Personal [phone]  Please contact further when possible

## 2021-12-01 NOTE — Telephone Encounter (Signed)
Called patient and stated that his stomach is feeling better today and is fine

## 2021-12-04 ENCOUNTER — Telehealth: Payer: Self-pay

## 2021-12-04 ENCOUNTER — Encounter (HOSPITAL_BASED_OUTPATIENT_CLINIC_OR_DEPARTMENT_OTHER): Payer: Medicaid Other | Admitting: Internal Medicine

## 2021-12-04 ENCOUNTER — Ambulatory Visit: Payer: Medicaid Other | Admitting: Physician Assistant

## 2021-12-04 VITALS — BP 127/91 | HR 75 | Ht 71.0 in | Wt 200.0 lb

## 2021-12-04 DIAGNOSIS — R7309 Other abnormal glucose: Secondary | ICD-10-CM

## 2021-12-04 DIAGNOSIS — E559 Vitamin D deficiency, unspecified: Secondary | ICD-10-CM | POA: Diagnosis not present

## 2021-12-04 DIAGNOSIS — R519 Headache, unspecified: Secondary | ICD-10-CM

## 2021-12-04 LAB — POCT GLYCOSYLATED HEMOGLOBIN (HGB A1C)
HbA1c POC (<> result, manual entry): 5.6 % (ref 4.0–5.6)
HbA1c, POC (controlled diabetic range): 5.6 % (ref 0.0–7.0)
HbA1c, POC (prediabetic range): 5.6 % — NL (ref 5.7–6.4)
Hemoglobin A1C: 5.6 % (ref 4.0–5.6)

## 2021-12-04 NOTE — Telephone Encounter (Signed)
Copied from [place] [id number]. Topic: General - Inquiry >> Dec 04, 2021 10:07 AM Marcellus Scott wrote: Reason for CRM: Pt stated he was given a kit to check his stool however, a new kit is needed because something was missing from last kit given.  Please advise.

## 2021-12-04 NOTE — Telephone Encounter (Signed)
Message received dated 11/30/2021, noting Jeremiah Hunter is requesting a call back.  I had already spoken to her and that call was documented in another telephone note.

## 2021-12-04 NOTE — Telephone Encounter (Signed)
Pt stated he wanted me to ask Jeremiah Hunter if she could please email him the letter sent to support his need for PCS as he cannot access it via his MyChart.  [email or web address]  Please advise.

## 2021-12-04 NOTE — Patient Instructions (Signed)
I encourage you to watch your sugar intake  I encourage you to talk to your mental health provider about your sleep issues  I encourage you to make sure that you change to your new eyeglasses  Roney Jaffe, PA-C Physician Assistant [facility] [email or web address]   General Headache Without Cause A headache is pain or discomfort felt around the head or neck area. There are many causes and types of headaches. A few common types include: Tension headaches. Migraine headaches. Cluster headaches. Chronic daily headaches. Sometimes, the specific cause of a headache may not be found. Follow these instructions at home: Watch your condition for any changes. Let your health care provider know about them. Take these steps to help with your condition: Managing pain     Take over-the-counter and prescription medicines only as told by your health care provider. Treatment may include medicines for pain that are taken by mouth or applied to the skin. Lie down in a dark, quiet room when you have a headache. Keep lights dim if bright lights bother you or make your headaches worse. If directed, put ice on your head and neck area: Put ice in a plastic bag. Place a towel between your skin and the bag. Leave the ice on for 20 minutes, 2-3 times per day. Remove the ice if your skin turns bright red. This is very important. If you cannot feel pain, heat, or cold, you have a greater risk of damage to the area. If directed, apply heat to the affected area. Use the heat source that your health care provider recommends, such as a moist heat pack or a heating pad. Place a towel between your skin and the heat source. Leave the heat on for 20-30 minutes. Remove the heat if your skin turns bright red. This is especially important if you are unable to feel pain, heat, or cold. You have a greater risk of getting burned. Eating and drinking Eat meals on a  regular schedule. If you drink alcohol: Limit how much you have to: 0-1 drink a day for women who are not pregnant. 0-2 drinks a day for men. Know how much alcohol is in a drink. In the [place] one drink equals one 12 oz bottle of beer (355 mL), one 5 oz glass of wine (148 mL), or one 1 oz glass of hard liquor (44 mL). Stop drinking caffeine, or decrease the amount of caffeine you drink. Drink enough fluid to keep your urine pale yellow. General instructions  Keep a headache journal to help find out what may trigger your headaches. For example, write down: What you eat and drink. How much sleep you get. Any change to your diet or medicines. Try massage or other relaxation techniques. Limit stress. Sit up straight, and do not tense your muscles. Do not use any products that contain nicotine or tobacco. These products include cigarettes, chewing tobacco, and vaping devices, such as e-cigarettes. If you need help quitting, ask your health care provider. Exercise regularly as told by your health care provider. Sleep on a regular schedule. Get 7-9 hours of sleep each night, or the amount recommended by your health care provider. Keep all follow-up visits. This is important. Contact a health care provider if: Medicine does not help your symptoms. You have a headache that is different from your usual headache. You have nausea or you vomit. You have a fever. Get help right away if: Your headache: Becomes severe quickly. Gets worse after moderate to intense physical  activity. You have any of these symptoms: Repeated vomiting. Pain or stiffness in your neck. Changes to your vision. Pain in an eye or ear. Problems with speech. Muscular weakness or loss of muscle control. Loss of balance or coordination. You feel faint or pass out. You have confusion. You have a seizure. These symptoms may represent a serious problem that is an emergency. Do not wait to see if the symptoms will go away.  Get medical help right away. Call your local emergency services (911 in the [place] Do not drive yourself to the hospital. Summary A headache is pain or discomfort felt around the head or neck area. There are many causes and types of headaches. In some cases, the cause may not be found. Keep a headache journal to help find out what may trigger your headaches. Watch your condition for any changes. Let your health care provider know about them. Contact a health care provider if you have a headache that is different from the usual headache, or if your symptoms are not helped by medicine. Get help right away if your headache becomes severe, you vomit, you have a loss of vision, you lose your balance, or you have a seizure. This information is not intended to replace advice given to you by your health care provider. Make sure you discuss any questions you have with your health care provider. Document Revised: 06/08/2020 Document Reviewed: 06/08/2020 Elsevier Patient Education  2023 [employer].

## 2021-12-04 NOTE — Progress Notes (Unsigned)
   Established Patient Office Visit  Subjective   Patient ID: Jeremiah Hunter, male    DOB: Sep 26, 1960  Age: 61 y.o. MRN: [id number]  No chief complaint on file.   Funny felling in his head for the past couple of weeks - described it  Not everyday Aching in ear when he chews  Bright red after bm - - little of straining - no pain - no problem for the 19months   Water - 4 bottles  Sleeping - hard time both -   Goes to [employer] for behavorial health Someone comes to house weekly   Stress is high - worries a lot   Getting new eyeglasses next week  5.6      {History (Optional):23778}  ROS    Objective:     There were no vitals taken for this visit. {Vitals History (Optional):23777}  Physical Exam   No results found for any visits on 12/04/21.  {Labs (Optional):23779}  The 10-year ASCVD risk score (Arnett DK, et al., 2019) is: 8.8%    Assessment & Plan:   Problem List Items Addressed This Visit   None   No follow-ups on file.    Kasandra Knudsen Mayers, PA-C

## 2021-12-05 ENCOUNTER — Ambulatory Visit (INDEPENDENT_AMBULATORY_CARE_PROVIDER_SITE_OTHER): Payer: Medicaid Other | Admitting: Orthopaedic Surgery

## 2021-12-05 ENCOUNTER — Encounter: Payer: Self-pay | Admitting: Orthopaedic Surgery

## 2021-12-05 ENCOUNTER — Ambulatory Visit: Payer: Self-pay

## 2021-12-05 ENCOUNTER — Encounter: Payer: Self-pay | Admitting: Physician Assistant

## 2021-12-05 VITALS — Ht 71.0 in | Wt 200.0 lb

## 2021-12-05 DIAGNOSIS — M542 Cervicalgia: Secondary | ICD-10-CM | POA: Diagnosis not present

## 2021-12-05 DIAGNOSIS — G9589 Other specified diseases of spinal cord: Secondary | ICD-10-CM

## 2021-12-05 NOTE — Telephone Encounter (Signed)
I called the patient and he stated that he has not been able to access the letter regarding PCS in his MyChart and requested that I email it to him: [email or web address].  Letter supporting need for PCS  then emailed to patient   He also explained that he was doing a home test for colon cancer but a part of the kit was missing so he was not able to complete the test.  I don't see an order for cologuard or a FIT?   Can an order be placed for cologuard.   thanks    

## 2021-12-05 NOTE — Telephone Encounter (Signed)
I spoke to the patient today and emailed him the letter as he requested

## 2021-12-06 DIAGNOSIS — G9589 Other specified diseases of spinal cord: Secondary | ICD-10-CM | POA: Insufficient documentation

## 2021-12-06 NOTE — Telephone Encounter (Signed)
I called patient to inform him that he can pick up a FIT at [facility].  Voicemail was full, unable to leave a message
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
## 2021-12-06 NOTE — Telephone Encounter (Signed)
He had Neg FIT test in 06/2020  He is now due for repeat  I put order in for FIT test for him to pickup  I dont know what he is referring to at this time , just come in to get new kit

## 2021-12-06 NOTE — Addendum Note (Signed)
Addended by: Storm Frisk on: 12/06/2021 06:55 AM   Modules accepted: Orders

## 2021-12-07 ENCOUNTER — Telehealth: Payer: Self-pay | Admitting: Emergency Medicine

## 2021-12-07 NOTE — Telephone Encounter (Signed)
Copied from CRM [id number]. Topic: General - Other >> Dec 07, 2021  9:38 AM Esperanza Sheets wrote: Pt states she is unable to retreive a letter Erskine Squibb sent to his email that he needs today by 11:00 for an appeal  Please requesting a cb from Erskine Squibb asap  Please fu w/ pt

## 2021-12-07 NOTE — Telephone Encounter (Signed)
I called patient and informed him that he can pick up the FIT at [facility]. I reminded him that the clinic will be closed Thanksgiving Day as well as the Friday after Thanksgiving.

## 2021-12-07 NOTE — Telephone Encounter (Signed)
Copied from CRM [id number]. Topic: General - Other >> Dec 07, 2021  2:03 PM Everette C wrote: Reason for CRM: The patient has called to request that a copy of the letter that they've previously discussed with J. Brazeau be faxed to them as well   Please fax the document to [phone] Attention Ronnisha T.  Please contact the patient further if needed

## 2021-12-07 NOTE — Telephone Encounter (Signed)
I returned the call to patient and he confirmed that he would like the letter faxed to his niece at [phone].Marland Kitchen  Referral faxed as requested.

## 2021-12-07 NOTE — Telephone Encounter (Signed)
I called him back and he said he is not able to get into his email and he has asked his aide for assistance. I explained that the email was sent SECURE and he will need to create a password to access it.  He said he will try again and he also said that the appeal is not for a few more hours.

## 2021-12-08 ENCOUNTER — Encounter (HOSPITAL_COMMUNITY): Payer: Self-pay

## 2021-12-08 ENCOUNTER — Encounter: Payer: Self-pay | Admitting: Critical Care Medicine

## 2021-12-08 ENCOUNTER — Ambulatory Visit (HOSPITAL_COMMUNITY)
Admission: EM | Admit: 2021-12-08 | Discharge: 2021-12-08 | Disposition: A | Discharge status: Home / Self Care | Payer: Medicaid Other | Attending: Family Medicine | Admitting: Family Medicine

## 2021-12-08 DIAGNOSIS — K146 Glossodynia: Secondary | ICD-10-CM

## 2021-12-08 MED ORDER — CHLORHEXIDINE GLUCONATE 0.12 % MT SOLN: 15.0000 mL | Freq: Two times a day (BID) | OROMUCOSAL | 0 refills | Status: DC

## 2021-12-08 NOTE — Discharge Instructions (Addendum)
You were seen for tongue soreness.  As discussed I did not see an obvious ulceration today.  However, I have sent out a mouth wash to use to see if helpful.  I recommend you keep a food diary in regards to your symptoms to see if any food may be causing this.  Then please follow up with your primary care provider to discuss further.

## 2021-12-08 NOTE — ED Triage Notes (Signed)
Pt is here for possible ulcers on the tongue on and off x 32yr

## 2021-12-08 NOTE — ED Provider Notes (Signed)
[facility]    CSN: [id number] Arrival date & time: 12/08/21  1438      History   Chief Complaint Chief Complaint  Patient presents with   tongue ulcer    HPI Jeremiah Hunter is a 61 y.o. male.   Patient is here for ulcerations on his tongue.  This come/goes for about a year or so.  It feels at first like he bit he tongue.  He usually will get one in the same area.  Lasts for several days and will resolve.  No uri symptoms.  No fevers/chills.  He cannot really correlate it with anything he has eaten.        Past Medical History:  Diagnosis Date   Acute bilateral low back pain without sciatica 07/30/2019   Allergy    on meds   Anxiety    on meds   DDD (degenerative disc disease), lumbar    on meds   Enlarged prostate    GERD (gastroesophageal reflux disease)    on meds   Hypertension    on meds   Severe episode of recurrent major depressive disorder, with psychotic features (HCC) 03/24/2019   on meds   Sleep apnea    uses CPAP    Patient Active Problem List   Diagnosis Date Noted   Myelomalacia of cervical cord (HCC) 12/06/2021   Other spondylosis with myelopathy, cervical region 10/10/2021   Cervical spine disease 10/03/2021   Urinary retention 09/04/2021   Leg cramps 06/29/2021   Aphthous ulcer 06/21/2021   Vitamin D deficiency 06/13/2021   Pain due to onychomycosis of toenails of both feet 03/13/2021   Itching 01/26/2021   Frequent falls 12/08/2020   Lumbar foraminal stenosis 12/07/2020   DDD (degenerative disc disease), lumbar    Prolapsed internal hemorrhoids, grade 3 09/25/2019   Meralgia paresthetica of left side 09/11/2019   Bipolar affective disorder, currently depressed, moderate (HCC) 09/11/2019   External hemorrhoids with complication 07/30/2019   Overactive bladder 06/11/2019   OSA (obstructive sleep apnea) 03/24/2019   Essential hypertension 03/24/2019   Seizure disorder (HCC) 03/24/2019    Past Surgical History:   Procedure Laterality Date   CYSTECTOMY     DENTAL SURGERY     teeth pulled   HERNIA REPAIR         Home Medications    Prior to Admission medications   Medication Sig Start Date End Date Taking? Authorizing Provider  amLODipine (NORVASC) 5 MG tablet TAKE 1 TABLET(5 MG) BY MOUTH DAILY 06/13/21   Storm Frisk, MD  azelastine (ASTELIN) 0.1 % nasal spray Place 2 sprays into both nostrils 2 (two) times daily. Use in each nostril as directed 05/17/21   Storm Frisk, MD  B Complex-C-E-Zn (EQL STRESS B-COMPLEX C/ZINC) TABS One a day po. With food. 09/11/19   Dohmeier, Porfirio Mylar, MD  baclofen (LIORESAL) 10 MG tablet Take 10 mg by mouth 3 (three) times daily. 09/02/21   [provider]  chlorhexidine (PERIDEX) 0.12 % solution Use as directed 15 mLs in the mouth or throat 2 (two) times daily. 06/21/21   Storm Frisk, MD  Cyanocobalamin (VITAMIN B-12 PO) Take 1 tablet by mouth daily at 6 (six) AM.    [provider]  cyclobenzaprine (FLEXERIL) 10 MG tablet Take 1 tablet (10 mg total) by mouth 3 (three) times daily as needed for muscle spasms. 08/02/21   Mayers, Cari S, PA-C  divalproex (DEPAKOTE ER) 500 MG 24 hr tablet Take 1 tablet (500  mg total) by mouth in the morning and at bedtime. 06/13/21   Storm Frisk, MD  FLUoxetine (PROZAC) 20 MG capsule Take 1 capsule (20 mg total) by mouth daily. 06/13/21   Storm Frisk, MD  fluticasone (FLONASE) 50 MCG/ACT nasal spray Place 2 sprays into both nostrils daily. 09/26/21   Storm Frisk, MD  ibuprofen (ADVIL) 600 MG tablet Take 1 tablet (600 mg total) by mouth 3 (three) times daily. 08/26/21   Ellsworth Lennox, PA-C  lidocaine-prilocaine (EMLA) cream Apply 1 application topically as needed. 09/27/20   Storm Frisk, MD  loratadine (CLARITIN) 10 MG tablet TAKE 1 TABLET(10 MG) BY MOUTH DAILY 02/18/21   Storm Frisk, MD  ondansetron (ZOFRAN-ODT) 4 MG disintegrating tablet Take 1 tablet (4 mg total) by mouth every 8 (eight)  hours as needed for nausea or vomiting. 08/10/21   Rising, Lurena Joiner, PA-C  OXYTROL 3.9 MG/24HR Apply patch two times a week 09/21/21   Anders Simmonds, PA-C  phenazopyridine (PYRIDIUM) 200 MG tablet Take 200 mg by mouth 3 (three) times daily. 09/18/21   [provider]  polyethylene glycol (MIRALAX) 17 g packet Take 17 g by mouth daily. 09/01/21   Glyn Ade, MD  QUEtiapine (SEROQUEL) 100 MG tablet Take 1 tablet (100 mg total) by mouth at bedtime. 06/13/21   Storm Frisk, MD  tamsulosin (FLOMAX) 0.4 MG CAPS capsule Take 0.4 mg by mouth at bedtime. 09/18/21   [provider]  Vitamin D, Ergocalciferol, (DRISDOL) 1.25 MG (50000 UNIT) CAPS capsule Take 1 capsule (50,000 Units total) by mouth every 7 (seven) days. 06/14/21   Storm Frisk, MD    Family History Family History  Family history unknown: Yes    Social History Social History   Tobacco Use   Smoking status: Never   Smokeless tobacco: Never  Vaping Use   Vaping Use: Never used  Substance Use Topics   Alcohol use: Never   Drug use: Never     Allergies   Losartan   Review of Systems Review of Systems  Constitutional: Negative.   HENT:  Positive for mouth sores.   Respiratory: Negative.    Cardiovascular: Negative.   Gastrointestinal: Negative.   Genitourinary: Negative.   Musculoskeletal: Negative.   Psychiatric/Behavioral: Negative.       Physical Exam Triage Vital Signs ED Triage Vitals  Enc Vitals Group     BP 12/08/21 1527 123/86     Pulse Rate 12/08/21 1527 (!) 112     Resp 12/08/21 1527 16     Temp 12/08/21 1527 98.4 F (36.9 C)     Temp src --      SpO2 12/08/21 1527 98 %     Weight --      Height --      Head Circumference --      Peak Flow --      Pain Score 12/08/21 1525 10     Pain Loc --      Pain Edu? --      Excl. in GC? --    No data found.  Updated Vital Signs BP 123/86   Pulse (!) 112   Temp 98.4 F (36.9 C)   Resp 16   SpO2 98%   Visual  Acuity Right Eye Distance:   Left Eye Distance:   Bilateral Distance:    Right Eye Near:   Left Eye Near:    Bilateral Near:     Physical Exam Constitutional:  Appearance: Normal appearance.  HENT:     Head:     Comments: No obvious sore/ulceration noted on the tongue.  He points to the top of the tongue, midway just left of center.  There is an area of fullness/irregularly to the tongue but no obvious sore/lesion/irritation is noted Cardiovascular:     Rate and Rhythm: Normal rate and regular rhythm.  Pulmonary:     Effort: Pulmonary effort is normal.  Musculoskeletal:     Cervical back: Normal range of motion and neck supple. No tenderness.  Lymphadenopathy:     Cervical: No cervical adenopathy.  Skin:    General: Skin is warm.  Neurological:     General: No focal deficit present.     Mental Status: He is alert.  Psychiatric:        Mood and Affect: Mood normal.      UC Treatments / Results  Labs (all labs ordered are listed, but only abnormal results are displayed) Labs Reviewed - No data to display  EKG   Radiology No results found.  Procedures Procedures (including critical care time)  Medications Ordered in UC Medications - No data to display  Initial Impression / Assessment and Plan / UC Course  I have reviewed the triage vital signs and the nursing notes.  Pertinent labs & imaging results that were available during my care of the patient were reviewed by me and considered in my medical decision making (see chart for details).    Final Clinical Impressions(s) / UC Diagnoses   Final diagnoses:  Soreness of tongue     Discharge Instructions      You were seen for tongue soreness.  As discussed I did not see an obvious ulceration today.  However, I have sent out a mouth wash to use to see if helpful.  I recommend you keep a food diary in regards to your symptoms to see if any food may be causing this.  Then please follow up with your primary  care provider to discuss further.     ED Prescriptions     Medication Sig Dispense Auth. Provider   chlorhexidine (PERIDEX) 0.12 % solution Use as directed 15 mLs in the mouth or throat 2 (two) times daily. 120 mL Jannifer Franklin, MD      PDMP not reviewed this encounter.   Jannifer Franklin, MD 12/08/21 1549

## 2021-12-09 ENCOUNTER — Encounter: Payer: Self-pay | Admitting: Critical Care Medicine

## 2021-12-11 NOTE — Telephone Encounter (Signed)
Need to move this pt up to a December f/u appt

## 2021-12-12 ENCOUNTER — Telehealth: Payer: Self-pay

## 2021-12-12 NOTE — Telephone Encounter (Signed)
Per wright I called patient and scheduled him for December for evaluation

## 2021-12-22 ENCOUNTER — Encounter: Payer: Self-pay | Admitting: Critical Care Medicine

## 2021-12-25 NOTE — Progress Notes (Unsigned)
Established Patient Office Visit  Subjective:  Patient ID: Jeremiah Hunter, male    DOB: 06/26/1960  Age: 61 y.o. MRN: [id number] CC:  Primary care follow-up   HPI 01/26/21  Jeremiah Hunter is a 61 year old man who presents for primary care follow up today. His only acute complaint at this visit is some bodily itching, particularly on the right side of his neck and shoulder.   [name] has a history of low back pain and degenerative disc disease. He received an MRI for this and has completed physical therapy as recommended by orthopedics. He has joined a gym recently and is interested and motivated to begin exercising more frequently.   His hypertension is currently being managed with Amlodipine and he takes this daily. He denies taking home blood pressures at this time. His sleep apnea also remains well managed with the use of his CPAP machine at home.  Patient did mention that his mood has been low lately, as he has had multiple family members and friends pass away recently. He also has some worries surrounding his safety at his apartment.   06/13/21 This patient returns in follow-up and is complaining of some itching on the scalp and some cramps in the legs when walking.  On arrival blood pressure is good 132/84  Patient's been compliant with all his medications and his CPAP machine.  He does not have any new complaints he does need refills on all his medications at this time.  He has not had any recent falls.  9/12 Patient seen by way of a phone visit to assess status for personal care services currently is receiving services 2 hours a day 7 days a week with [facility].  They are going to come back in October for reassessment to see if he deserves more time.  He is having assistance with brushing his teeth other his activities of daily living.  Note since the last visit in May he has been to the emergency room for bladder cystitis and has been with urology at least once he received  course of antibiotics and tamsulosin he is taking tamsulosin finished antibiotics.  He has refills on all his medications.  He has had frequent falls.  He has an upcoming appointment with spine physician because he has lumbar spine disease and then saw Mcclung end of August and found to have cervical spine disease as well.  12/26/21 Patient seen in return follow-up on arrival blood pressure is 30/83.  Patient complains of soreness in the tongue and numbness in the feet he has been to podiatry without much improvement.  He has not been on gabapentin recently but needs refills.  He states this helps in the past.  He has not been connected with his mental health doctors and is not taking of his mental health medications he needs follow-up appointments.  He is getting patient care services.  There are no other complaints. Past Medical History:  Diagnosis Date   Acute bilateral low back pain without sciatica 07/30/2019   Allergy    on meds   Anxiety    on meds   DDD (degenerative disc disease), lumbar    on meds   Enlarged prostate    GERD (gastroesophageal reflux disease)    on meds   Hypertension    on meds   Severe episode of recurrent major depressive disorder, with psychotic features ([place]) 03/24/2019   on meds   Sleep apnea    uses CPAP  Past Surgical History:  Procedure Laterality Date   CYSTECTOMY     DENTAL SURGERY     teeth pulled   HERNIA REPAIR      Family History  Family history unknown: Yes    Social History   Socioeconomic History   Marital status: Single    Spouse name: Not on file   Number of children: Not on file   Years of education: Not on file   Highest education level: Not on file  Occupational History   Not on file  Tobacco Use   Smoking status: Never   Smokeless tobacco: Never  Vaping Use   Vaping Use: Never used  Substance and Sexual Activity   Alcohol use: Never   Drug use: Never   Sexual activity: Not Currently  Other Topics Concern   Not  on file  Social History Narrative   Not on file   Social Determinants of Health   Financial Resource Strain: Not on file  Food Insecurity: Not on file  Transportation Needs: Not on file  Physical Activity: Not on file  Stress: Not on file  Social Connections: Not on file  Intimate Partner Violence: Not on file    Outpatient Medications Prior to Visit  Medication Sig Dispense Refill   azelastine (ASTELIN) 0.1 % nasal spray Place 2 sprays into both nostrils 2 (two) times daily. Use in each nostril as directed 30 mL 12   B Complex-C-E-Zn (EQL STRESS B-COMPLEX C/ZINC) TABS One a day po. With food.     chlorhexidine (PERIDEX) 0.12 % solution Use as directed 15 mLs in the mouth or throat 2 (two) times daily. 120 mL 0   Cyanocobalamin (VITAMIN B-12 PO) Take 1 tablet by mouth daily at 6 (six) AM.     fluticasone (FLONASE) 50 MCG/ACT nasal spray Place 2 sprays into both nostrils daily. 16 g 6   amLODipine (NORVASC) 5 MG tablet TAKE 1 TABLET(5 MG) BY MOUTH DAILY 60 tablet 4   baclofen (LIORESAL) 10 MG tablet Take 10 mg by mouth 3 (three) times daily.     cyclobenzaprine (FLEXERIL) 10 MG tablet Take 1 tablet (10 mg total) by mouth 3 (three) times daily as needed for muscle spasms. 30 tablet 0   OXYTROL 3.9 MG/24HR Apply patch two times a week 8 patch 2   Vitamin D, Ergocalciferol, (DRISDOL) 1.25 MG (50000 UNIT) CAPS capsule Take 1 capsule (50,000 Units total) by mouth every 7 (seven) days. 12 capsule 5   divalproex (DEPAKOTE ER) 500 MG 24 hr tablet Take 1 tablet (500 mg total) by mouth in the morning and at bedtime. (Patient not taking: Reported on 12/26/2021) 60 tablet 4   FLUoxetine (PROZAC) 20 MG capsule Take 1 capsule (20 mg total) by mouth daily. (Patient not taking: Reported on 12/26/2021) 60 capsule 2   loratadine (CLARITIN) 10 MG tablet TAKE 1 TABLET(10 MG) BY MOUTH DAILY (Patient not taking: Reported on 12/26/2021) 30 tablet 4   QUEtiapine (SEROQUEL) 100 MG tablet Take 1 tablet (100 mg  total) by mouth at bedtime. (Patient not taking: Reported on 12/26/2021) 60 tablet 2   ibuprofen (ADVIL) 600 MG tablet Take 1 tablet (600 mg total) by mouth 3 (three) times daily. (Patient not taking: Reported on 12/26/2021) 30 tablet 0   lidocaine-prilocaine (EMLA) cream Apply 1 application topically as needed. (Patient not taking: Reported on 12/26/2021) 30 g 5   ondansetron (ZOFRAN-ODT) 4 MG disintegrating tablet Take 1 tablet (4 mg total) by mouth every 8 (eight) hours  as needed for nausea or vomiting. (Patient not taking: Reported on 12/26/2021) 12 tablet 0   phenazopyridine (PYRIDIUM) 200 MG tablet Take 200 mg by mouth 3 (three) times daily. (Patient not taking: Reported on 12/26/2021)     polyethylene glycol (MIRALAX) 17 g packet Take 17 g by mouth daily. (Patient not taking: Reported on 12/26/2021) 14 each 0   tamsulosin (FLOMAX) 0.4 MG CAPS capsule Take 0.4 mg by mouth at bedtime. (Patient not taking: Reported on 12/26/2021)     No facility-administered medications prior to visit.    Allergies  Allergen Reactions   Losartan Itching    ROS Review of Systems  Constitutional: Negative.   HENT: Negative.  Negative for ear pain, postnasal drip, rhinorrhea, sinus pressure, sore throat, trouble swallowing and voice change.   Eyes: Negative.   Respiratory: Negative.  Negative for apnea, cough, choking, chest tightness, shortness of breath, wheezing and stridor.   Cardiovascular: Negative.  Negative for chest pain, palpitations and leg swelling.  Gastrointestinal: Negative.  Negative for abdominal distention, abdominal pain, nausea and vomiting.  Endocrine: Negative.   Genitourinary: Negative.   Musculoskeletal:  Positive for back pain. Negative for arthralgias and myalgias.  Skin:  Negative for rash.  Allergic/Immunologic: Negative.  Negative for environmental allergies and food allergies.  Neurological:  Positive for numbness. Negative for dizziness, syncope and headaches. Weakness: frequent  falls. Hematological: Negative.  Negative for adenopathy. Does not bruise/bleed easily.  Psychiatric/Behavioral:  Negative for agitation, dysphoric mood, self-injury, sleep disturbance and suicidal ideas. The patient is not nervous/anxious.       Objective:    Vitals:   12/26/21 1347  BP: 130/83  Pulse: 69  SpO2: 99%  Weight: 200 lb 3.2 oz (90.8 kg)    Gen: Pleasant, well-nourished, in no distress,  normal affect  ENT: No lesions,  mouth clear,  oropharynx clear, no postnasal drip  Neck: No JVD, no TMG, no carotid bruits  Lungs: No use of accessory muscles, no dullness to percussion, clear without rales or rhonchi  Cardiovascular: RRR, heart sounds normal, no murmur or gallops, no peripheral edema  Abdomen: soft and NT, no HSM,  BS normal  Musculoskeletal: No deformities, no cyanosis or clubbing  Neuro: alert, non focal  Skin: Warm, no lesions or rashes  No results found.   BP 130/83   Pulse 69   Wt 200 lb 3.2 oz (90.8 kg)   SpO2 99%   BMI 27.92 kg/m  Wt Readings from Last 3 Encounters:  12/26/21 200 lb 3.2 oz (90.8 kg)  12/05/21 200 lb (90.7 kg)  12/04/21 200 lb (90.7 kg)     Health Maintenance Due  Topic Date Due   COLON CANCER SCREENING ANNUAL FOBT  06/30/2021    There are no preventive care reminders to display for this patient.  Lab Results  Component Value Date   TSH 2.170 03/24/2019   Lab Results  Component Value Date   WBC 6.8 09/04/2021   HGB 13.3 09/04/2021   HCT 39.5 09/04/2021   MCV 85.3 09/04/2021   PLT 231 09/04/2021   Lab Results  Component Value Date   NA 143 11/24/2021   K 4.2 11/24/2021   CO2 24 11/24/2021   GLUCOSE 100 (H) 11/24/2021   BUN 18 11/24/2021   CREATININE 1.53 (H) 11/24/2021   BILITOT 0.6 09/04/2021   ALKPHOS 70 09/04/2021   AST 19 09/04/2021   ALT 11 09/04/2021   PROT 6.8 09/04/2021   ALBUMIN 3.7 09/04/2021   CALCIUM 9.0  11/24/2021   ANIONGAP 5 09/04/2021   EGFR 52 (L) 11/24/2021   Lab Results   Component Value Date   CHOL 145 06/13/2021   Lab Results  Component Value Date   HDL 59 06/13/2021   Lab Results  Component Value Date   LDLCALC 66 06/13/2021   Lab Results  Component Value Date   TRIG 110 06/13/2021   Lab Results  Component Value Date   CHOLHDL 2.5 06/13/2021   Lab Results  Component Value Date   HGBA1C 5.6 12/04/2021   HGBA1C 5.6 12/04/2021   HGBA1C 5.6 12/04/2021   HGBA1C 5.6 12/04/2021      Assessment & Plan:   Problem List Items Addressed This Visit       Cardiovascular and Mediastinum   Essential hypertension - Primary    Hypertension currently under reasonable control no change in medications which is amlodipine 5 mg daily      Relevant Medications   amLODipine (NORVASC) 5 MG tablet     Nervous and Auditory   Other spondylosis with myelopathy, cervical region    Recently seen by orthopedic spine observation for now we will continue with gabapentin high-dose      Relevant Medications   cyclobenzaprine (FLEXERIL) 10 MG tablet     Genitourinary   Overactive bladder    Continue with care per urology        Other   Bipolar affective disorder, currently depressed, moderate ([place])    Need to get this patient back in with psychiatry will connect with clinical social work      Vitamin D deficiency    Continue with vitamin D and also asked to take B complex      Other Visit Diagnoses     Muscle cramps       Relevant Medications   cyclobenzaprine (FLEXERIL) 10 MG tablet      Meds ordered this encounter  Medications   amLODipine (NORVASC) 5 MG tablet    Sig: TAKE 1 TABLET(5 MG) BY MOUTH DAILY    Dispense:  60 tablet    Refill:  4   cyclobenzaprine (FLEXERIL) 10 MG tablet    Sig: Take 1 tablet (10 mg total) by mouth 3 (three) times daily as needed for muscle spasms.    Dispense:  90 tablet    Refill:  1    Stop baclofen   OXYTROL 3.9 MG/24HR    Sig: Apply patch two times a week    Dispense:  8 patch    Refill:  2    Vitamin D, Ergocalciferol, (DRISDOL) 1.25 MG (50000 UNIT) CAPS capsule    Sig: Take 1 capsule (50,000 Units total) by mouth every 7 (seven) days.    Dispense:  12 capsule    Refill:  5   gabapentin (NEURONTIN) 300 MG capsule    Sig: Take 2 capsules (600 mg total) by mouth 3 (three) times daily.    Dispense:  180 capsule    Refill:  3  I  Asencion Noble, MD

## 2021-12-26 ENCOUNTER — Telehealth: Payer: Self-pay | Admitting: Critical Care Medicine

## 2021-12-26 ENCOUNTER — Ambulatory Visit: Payer: Medicaid Other | Attending: Critical Care Medicine | Admitting: Critical Care Medicine

## 2021-12-26 ENCOUNTER — Telehealth: Payer: Self-pay | Admitting: Emergency Medicine

## 2021-12-26 ENCOUNTER — Encounter: Payer: Self-pay | Admitting: Critical Care Medicine

## 2021-12-26 VITALS — BP 130/83 | HR 69 | Wt 200.2 lb

## 2021-12-26 DIAGNOSIS — I10 Essential (primary) hypertension: Secondary | ICD-10-CM | POA: Insufficient documentation

## 2021-12-26 DIAGNOSIS — F3132 Bipolar disorder, current episode depressed, moderate: Secondary | ICD-10-CM

## 2021-12-26 DIAGNOSIS — Z760 Encounter for issue of repeat prescription: Secondary | ICD-10-CM | POA: Insufficient documentation

## 2021-12-26 DIAGNOSIS — Z79899 Other long term (current) drug therapy: Secondary | ICD-10-CM | POA: Insufficient documentation

## 2021-12-26 DIAGNOSIS — E559 Vitamin D deficiency, unspecified: Secondary | ICD-10-CM | POA: Diagnosis not present

## 2021-12-26 DIAGNOSIS — N3281 Overactive bladder: Secondary | ICD-10-CM | POA: Insufficient documentation

## 2021-12-26 DIAGNOSIS — R450 Nervousness: Secondary | ICD-10-CM | POA: Insufficient documentation

## 2021-12-26 DIAGNOSIS — F319 Bipolar disorder, unspecified: Secondary | ICD-10-CM | POA: Diagnosis not present

## 2021-12-26 DIAGNOSIS — M4712 Other spondylosis with myelopathy, cervical region: Secondary | ICD-10-CM | POA: Diagnosis not present

## 2021-12-26 DIAGNOSIS — R252 Cramp and spasm: Secondary | ICD-10-CM | POA: Insufficient documentation

## 2021-12-26 MED ORDER — GABAPENTIN 300 MG PO CAPS: 600.0000 mg | ORAL_CAPSULE | Freq: Three times a day (TID) | ORAL | 3 refills | Status: DC

## 2021-12-26 MED ORDER — OXYTROL 3.9 MG/24HR TD PTTW: MEDICATED_PATCH | TRANSDERMAL | 2 refills | Status: DC

## 2021-12-26 MED ORDER — AMLODIPINE BESYLATE 5 MG PO TABS: ORAL_TABLET | ORAL | 4 refills | Status: DC

## 2021-12-26 MED ORDER — VITAMIN D (ERGOCALCIFEROL) 1.25 MG (50000 UNIT) PO CAPS: 50000.0000 [IU] | ORAL_CAPSULE | ORAL | 5 refills | Status: DC

## 2021-12-26 MED ORDER — CYCLOBENZAPRINE HCL 10 MG PO TABS: 10.0000 mg | ORAL_TABLET | Freq: Three times a day (TID) | ORAL | 1 refills | Status: DC | PRN

## 2021-12-26 NOTE — Assessment & Plan Note (Signed)
Continue with vitamin D and also asked to take B complex

## 2021-12-26 NOTE — Telephone Encounter (Signed)
Copied from CRM [id number]. Topic: General - Other >> Dec 26, 2021  4:34 PM Ja-Kwan M wrote: Reason for CRM: Pt stated he had an appt with Dr. Delford Field today and a letter was suppose to be mailed to him. Pt requests that a copy of that be left in the front for him to pick up. Pt requests call back to advise when the letter is ready for pick up. Cb# [phone]

## 2021-12-26 NOTE — Telephone Encounter (Signed)
Please call this patient he needs to be reconnected with the ACT team he gets his mental health care in the community and he is now off all his medications and his mental health status is declining he is becoming more paranoid and his ideation

## 2021-12-26 NOTE — Patient Instructions (Signed)
We will get ACT team to see you  Refills on medications given  I put gabapentin back on your list  Take a B complex vitamin over the counter   Return Dr Delford Field 3 months

## 2021-12-26 NOTE — Assessment & Plan Note (Addendum)
Hypertension currently under reasonable control no change in medications which is amlodipine 5 mg daily

## 2021-12-26 NOTE — Assessment & Plan Note (Signed)
Need to get this patient back in with psychiatry will connect with clinical social work

## 2021-12-26 NOTE — Assessment & Plan Note (Signed)
Continue with care per urology

## 2021-12-26 NOTE — Assessment & Plan Note (Signed)
Recently seen by orthopedic spine observation for now we will continue with gabapentin high-dose

## 2021-12-27 NOTE — Telephone Encounter (Signed)
Noted once letter is produced I will have a copy at the front

## 2021-12-28 ENCOUNTER — Telehealth: Payer: Self-pay | Admitting: Emergency Medicine

## 2021-12-28 NOTE — Telephone Encounter (Signed)
I returned patient's call.  He said he dropped off paperwork for Dr Delford Field regarding his PCS services.  He needs a "letter" faxed to " another office."  He could not tell me the name of the office the letter is to be faxed to nor could he tell me what the letter needs to state.  He said all of that information is in the packet that he dropped off at [facility].  He also noted that the " letter" needs to be received by the requesting organization on or before 01/01/2022.   I told him that I have not seen this paperwork that he dropped off.  Dr Delford Field, Carly have you seen this?

## 2021-12-28 NOTE — Telephone Encounter (Signed)
Copied from CRM [id number]. Topic: General - Other >> Dec 28, 2021  2:25 PM Everette C wrote: Reason for CRM: The patient would like to speak with Durene Cal when possible regarding a previously discussed doctors note   The patient has also wanted to let the practice know that Jerald Kief will be requesting information on the patient's behalf soon   Please contact further when possible

## 2021-12-29 NOTE — Telephone Encounter (Signed)
I have not seen this paperwork.  

## 2021-12-31 ENCOUNTER — Encounter: Payer: Self-pay | Admitting: Critical Care Medicine

## 2021-12-31 ENCOUNTER — Encounter: Payer: Self-pay | Admitting: Physician Assistant

## 2021-12-31 ENCOUNTER — Other Ambulatory Visit: Payer: Self-pay

## 2021-12-31 ENCOUNTER — Emergency Department (HOSPITAL_COMMUNITY)
Admission: EM | Admit: 2021-12-31 | Discharge: 2022-01-01 | Disposition: A | Discharge status: Home / Self Care | Payer: Medicaid Other | Attending: Emergency Medicine | Admitting: Emergency Medicine

## 2021-12-31 DIAGNOSIS — Z79899 Other long term (current) drug therapy: Secondary | ICD-10-CM | POA: Insufficient documentation

## 2021-12-31 DIAGNOSIS — R509 Fever, unspecified: Secondary | ICD-10-CM | POA: Diagnosis not present

## 2021-12-31 DIAGNOSIS — Z20822 Contact with and (suspected) exposure to covid-19: Secondary | ICD-10-CM | POA: Diagnosis not present

## 2021-12-31 DIAGNOSIS — I10 Essential (primary) hypertension: Secondary | ICD-10-CM | POA: Diagnosis not present

## 2021-12-31 DIAGNOSIS — R208 Other disturbances of skin sensation: Secondary | ICD-10-CM

## 2021-12-31 LAB — FECAL OCCULT BLOOD, IMMUNOCHEMICAL: Fecal Occult Bld: NEGATIVE

## 2021-12-31 LAB — RESP PANEL BY RT-PCR (RSV, FLU A&B, COVID)  RVPGX2
Influenza A by PCR: NEGATIVE
Influenza B by PCR: NEGATIVE
Resp Syncytial Virus by PCR: NEGATIVE
SARS Coronavirus 2 by RT PCR: NEGATIVE

## 2021-12-31 NOTE — ED Provider Notes (Signed)
[facility]-EMERGENCY DEPT Provider Note   CSN: [id number] Arrival date & time: 12/31/21  1749     History  Chief Complaint  Patient presents with   Fever    Jeremiah Hunter is a 61 y.o. male with medical history of major depressive disorder with psychotic features, GERD, hypertension, schizophrenia.  Patient presents to ED for evaluation of "feeling hot".  Patient states that beginning last night he had an overwhelming sensation of being very hot.  The patient states that at home he has turned off his heat and over the last 1 day he has only been wearing shorts and a T-shirt.  Patient states that 2 weeks ago he was around someone who later tested positive for COVID.  The patient reports that he did take a home COVID test which was negative.  Patient states that he called nurses line who advised him to report to ED, the patient states that the nurse called an ambulance for him.  On interview, the patient is now complaining of feeling cold in the examination room.  The patient is also complaining of pain in his tongue which she has been seen for at a local urgent care.  Patient denies any chest pain, shortness of breath, fevers, nausea, vomiting, diarrhea, sore throat, cough, body aches or chills, trouble swallowing, loss of taste or smell.  Of note, this patient reports that he recently stopped taking all of his medications for his schizophrenia and bipolar disorder.  The patient states that he decided to stop taking these medications cold [place] until he is seen by his PCP. Patient denies any SI, HI, AVH.   Fever Associated symptoms: no chest pain, no chills, no cough, no diarrhea, no myalgias, no nausea and no vomiting        Home Medications Prior to Admission medications   Medication Sig Start Date End Date Taking? Authorizing Provider  amLODipine (NORVASC) 5 MG tablet TAKE 1 TABLET(5 MG) BY MOUTH DAILY 12/26/21   Storm Frisk, MD  azelastine (ASTELIN) 0.1 %  nasal spray Place 2 sprays into both nostrils 2 (two) times daily. Use in each nostril as directed 05/17/21   Storm Frisk, MD  B Complex-C-E-Zn (EQL STRESS B-COMPLEX C/ZINC) TABS One a day po. With food. 09/11/19   Dohmeier, Porfirio Mylar, MD  chlorhexidine (PERIDEX) 0.12 % solution Use as directed 15 mLs in the mouth or throat 2 (two) times daily. 12/08/21   Piontek, Denny Peon, MD  Cyanocobalamin (VITAMIN B-12 PO) Take 1 tablet by mouth daily at 6 (six) AM.    [provider]  cyclobenzaprine (FLEXERIL) 10 MG tablet Take 1 tablet (10 mg total) by mouth 3 (three) times daily as needed for muscle spasms. 12/26/21   Storm Frisk, MD  divalproex (DEPAKOTE ER) 500 MG 24 hr tablet Take 1 tablet (500 mg total) by mouth in the morning and at bedtime. Patient not taking: Reported on 12/26/2021 06/13/21   Storm Frisk, MD  FLUoxetine (PROZAC) 20 MG capsule Take 1 capsule (20 mg total) by mouth daily. Patient not taking: Reported on 12/26/2021 06/13/21   Storm Frisk, MD  fluticasone [facility]) 50 MCG/ACT nasal spray Place 2 sprays into both nostrils daily. 09/26/21   Storm Frisk, MD  gabapentin (NEURONTIN) 300 MG capsule Take 2 capsules (600 mg total) by mouth 3 (three) times daily. 12/26/21   Storm Frisk, MD  loratadine (CLARITIN) 10 MG tablet TAKE 1 TABLET(10 MG) BY MOUTH DAILY Patient not taking: Reported  on 12/26/2021 02/18/21   Storm Frisk, MD  OXYTROL 3.9 MG/24HR Apply patch two times a week 12/26/21   Storm Frisk, MD  QUEtiapine (SEROQUEL) 100 MG tablet Take 1 tablet (100 mg total) by mouth at bedtime. Patient not taking: Reported on 12/26/2021 06/13/21   Storm Frisk, MD  Vitamin D, Ergocalciferol, (DRISDOL) 1.25 MG (50000 UNIT) CAPS capsule Take 1 capsule (50,000 Units total) by mouth every 7 (seven) days. 12/26/21   Storm Frisk, MD      Allergies    Losartan    Review of Systems   Review of Systems  Constitutional:  Positive for fever. Negative for chills  and fatigue.  HENT:  Negative for trouble swallowing.   Respiratory:  Negative for cough and shortness of breath.   Cardiovascular:  Negative for chest pain.  Gastrointestinal:  Negative for diarrhea, nausea and vomiting.  Musculoskeletal:  Negative for myalgias.  All other systems reviewed and are negative.   Physical Exam Updated Vital Signs BP (!) 135/96   Pulse 60   Temp 98.6 F (37 C) (Oral)   Resp 18   Ht 5\' 11"  (1.803 m)   Wt 90.7 kg   SpO2 99%   BMI 27.89 kg/m  Physical Exam Vitals and nursing note reviewed.  Constitutional:      General: He is not in acute distress.    Appearance: He is well-developed.  HENT:     Head: Normocephalic and atraumatic.     Comments: Posterior oropharynx shows no signs of erythema, exudate.  The patient uvula is midline.  The patient is handling secretions appropriately.    Nose: Nose normal. No congestion.     Mouth/Throat:     Pharynx: No oropharyngeal exudate or posterior oropharyngeal erythema.  Eyes:     Conjunctiva/sclera: Conjunctivae normal.  Cardiovascular:     Rate and Rhythm: Normal rate and regular rhythm.     Heart sounds: No murmur heard.    Comments: Regular rate and rhythm  Pulmonary:     Effort: Pulmonary effort is normal. No respiratory distress.     Breath sounds: Normal breath sounds. No stridor. No wheezing, rhonchi or rales.     Comments: Lung sounds clear to auscultation bilaterally. Abdominal:     General: Bowel sounds are normal. There is no distension.     Palpations: Abdomen is soft.     Tenderness: There is no abdominal tenderness. There is no right CVA tenderness, left CVA tenderness, guarding or rebound.  Musculoskeletal:        General: No swelling.     Cervical back: Normal range of motion and neck supple. No rigidity or tenderness.  Skin:    General: Skin is warm and dry.     Capillary Refill: Capillary refill takes less than 2 seconds.  Neurological:     General: No focal deficit present.      Mental Status: He is alert and oriented to person, place, and time.     GCS: GCS eye subscore is 4. GCS verbal subscore is 5. GCS motor subscore is 6.     Cranial Nerves: Cranial nerves 2-12 are intact. No cranial nerve deficit.     Sensory: Sensation is intact. No sensory deficit.     Motor: Motor function is intact. No weakness.     Coordination: Coordination is intact. Heel to [facility] Test normal.     Comments: Normal neurological examination.  Cranial nerves II through XII intact.  Patient can perform rapid  alternating movements.  The patient can perform finger-nose, heel-to-shin.  No pronator drift.  Patient has 5/5 strength upper extremities bilaterally.  Patient has 5/5 strength bilateral lower extremities.  No facial droop.  No slurred speech.  Psychiatric:        Mood and Affect: Mood normal.     Comments: Patient alert and oriented x 4.     ED Results / Procedures / Treatments   Labs (all labs ordered are listed, but only abnormal results are displayed) Labs Reviewed  RESP PANEL BY RT-PCR (RSV, FLU A&B, COVID)  [id number]    EKG None  Radiology No results found.  Procedures Procedures   Medications Ordered in ED Medications - No data to display  ED Course/ Medical Decision Making/ A&P                           Medical Decision Making  61 year old male presents to the ED for evaluation of "feeling hot".  Please see HPI for further details.  On my examination the patient is afebrile with a temperature of 98.6 Fahrenheit, he is not tachycardic with a pulse rate of 60.  The patient lung sounds are clear bilaterally, he is not hypoxic on room air.  No wheezing, rhonchi or rales are noted.  The patient abdomen is soft and compressible throughout without any evidence of bilateral CVA tenderness.  The patient has no guarding, rebound on examination.  Patient neurological examination shows no focal neurodeficits.  The patient cranial nerves II through XII are intact, normal  finger-to-nose and heel-to-shin.  The patient has no pronator drift, no slurred speech, no facial droop.  The patient is overall well-appearing and nontoxic in appearance.  Patient tongue inspected without any findings of lesions.  Patient respiratory panel which was ordered in triage is negative for COVID, flu, respiratory syncytial virus.  In examination room, the patient states that he is now actually cold, not hot.  Patient nontoxic in appearance.  The patient states he is concerned because he was exposed to someone who later tested positive for COVID-19 however the patient viral testing here is negative.  Patient denies any cough, fever so I see no need for chest x-ray at this time.  The patient does not have a fever here.  On chart review, it appears that this patient recently stopped taking his psychiatric medication.  The patient also endorses this to me.  I advised the patient that he should most likely begin taking his medication again.  The patient stated that until he talks with his PCP he would not be willing to do this.  I advised the patient that he should follow-up with his PCP sometime this week and call and make an appointment to be seen.  Advised the patient that here his temperature is normal, he has no tachycardia and he is testing negative for viruses.  Final Clinical Impression(s) / ED Diagnoses Final diagnoses:  Feeling of warmness    Rx / DC Orders ED Discharge Orders     None         Clent Ridges 12/31/21 2133    Derwood Kaplan, MD 01/01/22 [phone]

## 2021-12-31 NOTE — ED Triage Notes (Addendum)
Pt BIB EMS from home c/o "feeling hot" no other complaints, no fever in triage.

## 2021-12-31 NOTE — Discharge Instructions (Addendum)
Return to ED with any new or worsening signs or symptoms Please follow-up with your primary care doctor.  Please reach out to him and make an appointment this week to be seen.

## 2022-01-01 NOTE — Telephone Encounter (Signed)
Paperwork was received last week, (provider was out of office,  It is in [employer]

## 2022-01-03 ENCOUNTER — Ambulatory Visit (HOSPITAL_COMMUNITY)
Admission: EM | Admit: 2022-01-03 | Discharge: 2022-01-04 | Disposition: A | Discharge status: Home / Self Care | Payer: Medicaid Other | Attending: Registered Nurse | Admitting: Registered Nurse

## 2022-01-03 ENCOUNTER — Telehealth: Payer: Self-pay | Admitting: Critical Care Medicine

## 2022-01-03 ENCOUNTER — Encounter (HOSPITAL_COMMUNITY): Payer: Self-pay | Admitting: Registered Nurse

## 2022-01-03 DIAGNOSIS — Z1152 Encounter for screening for COVID-19: Secondary | ICD-10-CM | POA: Diagnosis not present

## 2022-01-03 DIAGNOSIS — F250 Schizoaffective disorder, bipolar type: Secondary | ICD-10-CM | POA: Diagnosis not present

## 2022-01-03 DIAGNOSIS — Z046 Encounter for general psychiatric examination, requested by authority: Secondary | ICD-10-CM

## 2022-01-03 HISTORY — DX: Encounter for general psychiatric examination, requested by authority: Z04.6

## 2022-01-03 LAB — COMPREHENSIVE METABOLIC PANEL
ALT: 14 U/L (ref 0–44)
AST: 23 U/L (ref 15–41)
Albumin: 3.8 g/dL (ref 3.5–5.0)
Alkaline Phosphatase: 96 U/L (ref 38–126)
Anion gap: 6 (ref 5–15)
BUN: 22 mg/dL — ABNORMAL HIGH (ref 6–20)
CO2: 24 mmol/L (ref 22–32)
Calcium: 8.8 mg/dL — ABNORMAL LOW (ref 8.9–10.3)
Chloride: 108 mmol/L (ref 98–111)
Creatinine, Ser: 1.07 mg/dL (ref 0.61–1.24)
GFR, Estimated: >60 mL/min (ref >60)
Glucose, Bld: 113 mg/dL — ABNORMAL HIGH (ref 70–99)
Potassium: 3.8 mmol/L (ref 3.5–5.1)
Sodium: 138 mmol/L (ref 135–145)
Total Bilirubin: 0.3 mg/dL (ref 0.3–1.2)
Total Protein: 6.8 g/dL (ref 6.5–8.1)

## 2022-01-03 LAB — ETHANOL: Alcohol, Ethyl (B): <10 mg/dL (ref <10)

## 2022-01-03 LAB — CBC WITH DIFFERENTIAL/PLATELET
Abs Immature Granulocytes: 0.01 10*3/uL (ref 0.00–0.07)
Basophils Absolute: 0 10*3/uL (ref 0.0–0.1)
Basophils Relative: 1 %
Eosinophils Absolute: 0.1 10*3/uL (ref 0.0–0.5)
Eosinophils Relative: 3 %
HCT: 40.4 % (ref 39.0–52.0)
Hemoglobin: 13.8 g/dL (ref 13.0–17.0)
Immature Granulocytes: 0 %
Lymphocytes Relative: 49 %
Lymphs Abs: 1.8 10*3/uL (ref 0.7–4.0)
MCH: 27.6 pg (ref 26.0–34.0)
MCHC: 34.2 g/dL (ref 30.0–36.0)
MCV: 80.8 fL (ref 80.0–100.0)
Monocytes Absolute: 0.3 10*3/uL (ref 0.1–1.0)
Monocytes Relative: 9 %
Neutro Abs: 1.4 10*3/uL — ABNORMAL LOW (ref 1.7–7.7)
Neutrophils Relative %: 38 %
Platelets: 242 10*3/uL (ref 150–400)
RBC: 5 MIL/uL (ref 4.22–5.81)
RDW: 14.9 % (ref 11.5–15.5)
WBC: 3.6 10*3/uL — ABNORMAL LOW (ref 4.0–10.5)
nRBC: 0 % (ref 0.0–0.2)

## 2022-01-03 LAB — RESP PANEL BY RT-PCR (RSV, FLU A&B, COVID)  RVPGX2
Influenza A by PCR: NEGATIVE
Influenza B by PCR: NEGATIVE
Resp Syncytial Virus by PCR: NEGATIVE
SARS Coronavirus 2 by RT PCR: NEGATIVE

## 2022-01-03 LAB — URINALYSIS, ROUTINE W REFLEX MICROSCOPIC
Bacteria, UA: NONE SEEN
Bilirubin Urine: NEGATIVE
Glucose, UA: NEGATIVE mg/dL
Ketones, ur: NEGATIVE mg/dL
Leukocytes,Ua: NEGATIVE
Nitrite: NEGATIVE
Protein, ur: NEGATIVE mg/dL
Specific Gravity, Urine: 1.006 (ref 1.005–1.030)
pH: 5 (ref 5.0–8.0)

## 2022-01-03 LAB — POCT URINE DRUG SCREEN - MANUAL ENTRY (I-SCREEN)
POC Amphetamine UR: NOT DETECTED
POC Buprenorphine (BUP): NOT DETECTED
POC Cocaine UR: NOT DETECTED
POC Marijuana UR: NOT DETECTED
POC Methadone UR: NOT DETECTED
POC Methamphetamine UR: NOT DETECTED
POC Morphine: NOT DETECTED
POC Oxazepam (BZO): NOT DETECTED
POC Oxycodone UR: NOT DETECTED
POC Secobarbital (BAR): NOT DETECTED

## 2022-01-03 LAB — LIPID PANEL
Cholesterol: 150 mg/dL (ref 0–200)
HDL: 64 mg/dL (ref >40)
LDL Cholesterol: 67 mg/dL (ref 0–99)
Total CHOL/HDL Ratio: 2.3 RATIO
Triglycerides: 96 mg/dL (ref <150)
VLDL: 19 mg/dL (ref 0–40)

## 2022-01-03 LAB — POC SARS CORONAVIRUS 2 AG: SARSCOV2ONAVIRUS 2 AG: NEGATIVE

## 2022-01-03 LAB — MAGNESIUM: Magnesium: 2.4 mg/dL (ref 1.7–2.4)

## 2022-01-03 LAB — TSH: TSH: 1.956 u[IU]/mL (ref 0.350–4.500)

## 2022-01-03 MED ORDER — VITAMIN B-12 100 MCG PO TABS
100.0000 ug | ORAL_TABLET | Freq: Every day | ORAL | Status: DC
  Administered 2022-01-04: 100 ug via ORAL
  Filled 2022-01-03: qty 1

## 2022-01-03 MED ORDER — FLUTICASONE PROPIONATE 50 MCG/ACT NA SUSP
2.0000 | Freq: Every day | NASAL | Status: DC
  Administered 2022-01-03: 2 via NASAL
  Filled 2022-01-03: qty 16

## 2022-01-03 MED ORDER — OXYBUTYNIN 3.9 MG/24HR TD PTTW
1.0000 | MEDICATED_PATCH | TRANSDERMAL | Status: DC
  Filled 2022-01-03: qty 1

## 2022-01-03 MED ORDER — AMLODIPINE BESYLATE 5 MG PO TABS
5.0000 mg | ORAL_TABLET | Freq: Every day | ORAL | Status: DC
  Filled 2022-01-03 (×2): qty 1

## 2022-01-03 MED ORDER — FLUOXETINE HCL 20 MG PO CAPS
20.0000 mg | ORAL_CAPSULE | Freq: Every day | ORAL | Status: DC
  Administered 2022-01-04: 20 mg via ORAL
  Filled 2022-01-03 (×2): qty 1

## 2022-01-03 MED ORDER — DIVALPROEX SODIUM ER 500 MG PO TB24
500.0000 mg | ORAL_TABLET | Freq: Two times a day (BID) | ORAL | Status: DC
  Administered 2022-01-04: 500 mg via ORAL
  Filled 2022-01-03 (×2): qty 1

## 2022-01-03 MED ORDER — VITAMIN D (ERGOCALCIFEROL) 1.25 MG (50000 UNIT) PO CAPS
50000.0000 [IU] | ORAL_CAPSULE | ORAL | Status: DC
  Filled 2022-01-03: qty 1

## 2022-01-03 MED ORDER — CYCLOBENZAPRINE HCL 10 MG PO TABS
10.0000 mg | ORAL_TABLET | Freq: Three times a day (TID) | ORAL | Status: DC | PRN
  Administered 2022-01-03: 10 mg via ORAL
  Filled 2022-01-03: qty 1

## 2022-01-03 MED ORDER — ACETAMINOPHEN 325 MG PO TABS: 650.0000 mg | ORAL_TABLET | Freq: Four times a day (QID) | ORAL | Status: DC | PRN

## 2022-01-03 MED ORDER — CHLORHEXIDINE GLUCONATE 0.12 % MT SOLN
15.0000 mL | Freq: Two times a day (BID) | OROMUCOSAL | Status: DC
  Filled 2022-01-03 (×2): qty 15

## 2022-01-03 MED ORDER — ALUM & MAG HYDROXIDE-SIMETH 200-200-20 MG/5ML PO SUSP: 30.0000 mL | ORAL | Status: DC | PRN

## 2022-01-03 MED ORDER — AZELASTINE HCL 0.1 % NA SOLN
2.0000 | Freq: Two times a day (BID) | NASAL | Status: DC
  Filled 2022-01-03: qty 30

## 2022-01-03 MED ORDER — GABAPENTIN 300 MG PO CAPS
600.0000 mg | ORAL_CAPSULE | Freq: Three times a day (TID) | ORAL | Status: DC
  Administered 2022-01-03 (×2): 600 mg via ORAL
  Filled 2022-01-03 (×3): qty 2

## 2022-01-03 MED ORDER — QUETIAPINE FUMARATE 100 MG PO TABS
100.0000 mg | ORAL_TABLET | Freq: Every day | ORAL | Status: DC
  Filled 2022-01-03: qty 1

## 2022-01-03 MED ORDER — MAGNESIUM HYDROXIDE 400 MG/5ML PO SUSP: 30.0000 mL | Freq: Every day | ORAL | Status: DC | PRN

## 2022-01-03 NOTE — ED Notes (Signed)
Patient observed/assessed in bed/chair resting quietly appearing in no distress and verbalizing no complaints at this time. Will continue to monitor.  

## 2022-01-03 NOTE — Progress Notes (Signed)
Jeremiah Hunter has been assisted to the bathroom x2 by nurse Minerva Areola.

## 2022-01-03 NOTE — ED Notes (Signed)
Attempted to call Nursing Supervisor about walker but unable to reach her at this time.  Msg left.

## 2022-01-03 NOTE — ED Notes (Addendum)
Patient observed/assessed on unit continuing to speak out incoherently. He is observed watching TV appearing in no immediate distress. Will continue to monitor and support.

## 2022-01-03 NOTE — ED Notes (Signed)
Staff walked with pt to bathroom.

## 2022-01-03 NOTE — ED Provider Notes (Signed)
[facility] Continuous Assessment Admission H&P  Date: 01/03/22 Patient Name: Jeremiah Hunter MRN: [id number] Chief Complaint:  Chief Complaint  Patient presents with   IVC      Diagnoses:  Final diagnoses:  Involuntary commitment  Schizoaffective disorder, bipolar type [facility])    HPI: Jeremiah Hunter is a 61 y.o. 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  Per IVC:  [name] has been diagnosed with schizo-affective disorder and bipolar type.  He called the crisis line stating he believes the police is watching him.  [name] threatened [name] stating he is going to kill her.  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 Melba Coon; and chart reviewed on 01/03/22.  On evaluation Jeremiah Hunter reports not sure why he was brought in.  When asked about statements on IVC patient states that he did call there crisis line "I done told them to take [name] off of my contact list and not to give her no mo information about me.  I don't need them telling her nothing about me.  Not when family against family.  She supposed to be on my side.  And my right to have her removed.  I been telling them to remove her for 3 months now.  Today I don't them if they don't take her off I was going to kill the bitch."  Patient states that he has no intent or plan to kill anyone.  "I was just upset cause I got to keep telling them to remove her from the list."  Patient states that one of the newer tents in the apartment complex owns 2 dogs and don't have on a leash.  States he has been chased by the dogs and was almost bitten one time, While running from dog fell and EMS has to be called.  Patient states that he has called police and spoke to the landlord about the dogs but the land lord is not paying attention to anything he says.   "When I was in prison they told me when I get out they was going to kill me.  The dogs must belong to the police and the police are watching me to kill me.  It's got to be the police cause the landlord or no body else is listening to me."  Patient chronic paranoia about police trying to kill him related to "I was in prison for 26 years and I was beat by the [employer] Barrister's clerk) they told me when I got out they was going to kill me, and I believe them to."  Always feels that the police are watching him and now that the dogs are in the complex and not on a leash feels that the police put them there.  He has made the statement that he was going to set the [place] on fire if don't "get them damn dogs out of there."  Patient denies suicidal/self-harm/and homicidal ideation.  States statements are made out of anger and frustration.  Patient denies auditory/visual hallucinations.  Patient states he lives in home with his mother and sister who are both supportive.  Reports he has outpatient psychiatric services with [facility] and last visit was yesterday.   During evaluation Jeremiah Hunter is sitting in chair with no noted distress.  He is  alert/oriented x 3, calm, cooperative and mood is pleasant, joking.  When asked about the current month and year he states "it in the 20's and it christmas month."  When asked the name of the current president he state that he is the current president "I'm president of the work.  I want all people to be happy."  He was then asked who does everyone else think the president of the [place] is "Biden, that's what they say."  He joked about the question of age "I'm 61 yrs old and I was born in [birth year]"  Informed that if born in [birth year] he could be 61 yrs old.  "I am 61 yrs old and I was born in room 1615."  He was able to give the correct DOB.  Patient states that he doesn't want to go home wants to stay over night.  Agreed related to IVC, reassess tomorrow.  His thought process is coherent and  relevant.  There is no indication that he is currently responding to internal/external stimuli.  There is some paranoid delusional thinking related to the police are watching and wanting to kill him, and that the 2 dogs in neighborhood belong to the police. He denies suicidal/self-harm/homicidal ideation, and psychosis.  He doesn't admit that he has paranoia.    PHQ 2-9:  [place] Visit from 12/04/2021 in [place] [facility] Office Visit from 08/02/2021 in [place] [facility] Office Visit from 07/17/2021 in [facility] 1  Thoughts that you would be better off dead, or of hurting yourself in some way Not at all Not at all Not at all  PHQ-9 Total Score 0 12 [facility] ED from 01/03/2022 in [facility] ED from 12/31/2021 in [facility] DEPT ED from 12/08/2021 in [facility] No Risk No Risk No Risk        Total Time spent with patient: 45 minutes    Musculoskeletal: Strength & Muscle Tone: within normal limits Gait & Station:  Uses a cane to assist with ambulation and an orthopedic shoe on left foot.  Patient leans: N/A   Psychiatric Specialty Exam  Presentation General Appearance:  Appropriate for Environment; Casual  Eye Contact: Good  Speech: Clear and Coherent; Normal Rate  Speech Volume: Normal  Handedness: Right   Mood and Affect  Mood: Euthymic  Affect: Congruent   Thought Process  Thought Processes: Coherent; Goal Directed  Descriptions of Associations:Intact  Orientation:Full (Time, Place and Person)  Thought Content:Logical; Paranoid Ideation  Diagnosis of Schizophrenia or Schizoaffective disorder in past: Yes  Duration of Psychotic Symptoms: Greater than six months  Hallucinations:Hallucinations: None  Ideas of Reference:Paranoia (Appears to be at baseline (chronic))  Suicidal Thoughts:Suicidal Thoughts:  No  Homicidal Thoughts:Homicidal Thoughts: No   Sensorium  Memory: Immediate Good; Recent Good  Judgment: Intact  Insight: Present   Executive Functions  Concentration: Fair  Attention Span: Fair  Recall: [place] of Knowledge: Fair  Language: Good   Psychomotor Activity  Psychomotor Activity: Psychomotor Activity: Normal   Assets  Assets: Communication Skills; Desire for Improvement; Financial Resources/Insurance; Housing; Leisure Time; Physical Health; Social Support   Sleep  Sleep: Sleep: Good   Nutritional Assessment (For OBS and FBC admissions only) Has the patient had a weight loss or gain of 10 pounds or more in the last 3 months?: No Has the patient had a  decrease in food intake/or appetite?: No Does the patient have dental problems?: No Does the patient have eating habits or behaviors that may be indicators of an eating disorder including binging or inducing vomiting?: No Has the patient recently lost weight without trying?: 0 Has the patient been eating poorly because of a decreased appetite?: 0 Malnutrition Screening Tool Score: 0    Physical Exam Vitals and nursing note reviewed. Exam conducted with a chaperone present.  Constitutional:      General: He is not in acute distress.    Appearance: Normal appearance. He is not ill-appearing.     Comments: Wearing an orthopedic shoe on left foot  HENT:     Head: Normocephalic.  Eyes:     Conjunctiva/sclera: Conjunctivae normal.  Cardiovascular:     Rate and Rhythm: Normal rate.  Musculoskeletal:        General: Normal range of motion.     Cervical back: Normal range of motion.  Skin:    General: Skin is warm and dry.  Neurological:     Mental Status: He is alert and oriented to person, place, and time.  Psychiatric:        Attention and Perception: Attention and perception normal.        Mood and Affect: Mood and affect normal.        Speech: Speech normal.        Behavior:  Behavior normal. Behavior is cooperative.        Thought Content: Thought content is paranoid and delusional. Thought content does not include homicidal or suicidal ideation.        Judgment: Judgment is impulsive.    Review of Systems  Constitutional:  Negative for chills and fever.  Respiratory:  Negative for cough, shortness of breath and wheezing.   Cardiovascular:  Negative for chest pain and palpitations.  Gastrointestinal: Negative.   Genitourinary: Negative.   Musculoskeletal:        Orthoptic shoe on left foot and cane to assist with ambulation  Skin: Negative.   Neurological: Negative.   Psychiatric/Behavioral:  Negative for depression, hallucinations and substance abuse (States that he doesn't use drugs but "I tried to smoke marijuana one day last week but I couldn't"). The patient is nervous/anxious. The patient does not have insomnia.     Blood pressure (!) 120/92, pulse 83, temperature 98.2 F (36.8 C), temperature source Oral, resp. rate 18, SpO2 98 %. There is no height or weight on file to calculate BMI.  Past Psychiatric History: Schizoaffective Disorder bipolar type   Is the patient at risk to self? No  Has the patient been a risk to self in the past 6 months? No .    Has the patient been a risk to self within the distant past? No   Is the patient a risk to others? Yes   Has the patient been a risk to others in the past 6 months? No   Has the patient been a risk to others within the distant past? No   Past Medical History:  Past Medical History:  Diagnosis Date   Acute bilateral low back pain without sciatica 07/30/2019   Allergy    on meds   Anxiety    on meds   DDD (degenerative disc disease), lumbar    on meds   Enlarged prostate    GERD (gastroesophageal reflux disease)    on meds   Hypertension    on meds   Severe episode of recurrent major  depressive disorder, with psychotic features ([place]) 03/24/2019   on meds   Sleep apnea    uses CPAP     Past Surgical History:  Procedure Laterality Date   CYSTECTOMY     DENTAL SURGERY     teeth pulled   HERNIA REPAIR      Family History:  Family History  Family history unknown: Yes    Social History:  Social History   Socioeconomic History   Marital status: Single    Spouse name: Not on file   Number of children: Not on file   Years of education: Not on file   Highest education level: Not on file  Occupational History   Not on file  Tobacco Use   Smoking status: Never   Smokeless tobacco: Never  Vaping Use   Vaping Use: Never used  Substance and Sexual Activity   Alcohol use: Never   Drug use: Never   Sexual activity: Not Currently  Other Topics Concern   Not on file  Social History Narrative   Not on file   Social Determinants of Health   Financial Resource Strain: Not on file  Food Insecurity: Not on file  Transportation Needs: Not on file  Physical Activity: Not on file  Stress: Not on file  Social Connections: Not on file  Intimate Partner Violence: Not on file    SDOH:  SDOH Screenings   Depression (PHQ2-9): Low Risk  (12/05/2021)  Tobacco Use: Low Risk  (01/03/2022)    Last Labs:  Admission on 12/31/2021, Discharged on 01/01/2022  Component Date Value Ref Range Status   SARS Coronavirus 2 by RT PCR 12/31/2021 NEGATIVE  NEGATIVE Final   Comment: (NOTE) SARS-CoV-2 target nucleic acids are NOT DETECTED.  The SARS-CoV-2 RNA is generally detectable in upper respiratory specimens during the acute phase of infection. The lowest concentration of SARS-CoV-2 viral copies this assay can detect is 138 copies/mL. A negative result does not preclude SARS-Cov-2 infection and should not be used as the sole basis for treatment or other patient management decisions. A negative result may occur with  improper specimen collection/handling, submission of specimen other than nasopharyngeal swab, presence of viral mutation(s) within the areas targeted by this  assay, and inadequate number of viral copies(<138 copies/mL). A negative result must be combined with clinical observations, patient history, and epidemiological information. The expected result is Negative.  Fact Sheet for Patients:  EntrepreneurPulse.com.au  Fact Sheet for Healthcare Providers:  IncredibleEmployment.be  This test is no                          t yet approved or cleared by the [place] FDA and  has been authorized for detection and/or diagnosis of SARS-CoV-2 by FDA under an Emergency Use Authorization (EUA). This EUA will remain  in effect (meaning this test can be used) for the duration of the COVID-19 declaration under Section 564(b)(1) of the Act, 21 U.S.C.section 360bbb-3(b)(1), unless the authorization is terminated  or revoked sooner.       Influenza A by PCR 12/31/2021 NEGATIVE  NEGATIVE Final   Influenza B by PCR 12/31/2021 NEGATIVE  NEGATIVE Final   Comment: (NOTE) The Xpert Xpress SARS-CoV-2/FLU/RSV plus assay is intended as an aid in the diagnosis of influenza from Nasopharyngeal swab specimens and should not be used as a sole basis for treatment. Nasal washings and aspirates are unacceptable for Xpert Xpress SARS-CoV-2/FLU/RSV testing.  Fact Sheet for Patients: EntrepreneurPulse.com.au  Fact  Sheet for Healthcare Providers: IncredibleEmployment.be  This test is not yet approved or cleared by the [place] and has been authorized for detection and/or diagnosis of SARS-CoV-2 by FDA under an Emergency Use Authorization (EUA). This EUA will remain in effect (meaning this test can be used) for the duration of the COVID-19 declaration under Section 564(b)(1) of the Act, 21 U.S.C. section 360bbb-3(b)(1), unless the authorization is terminated or revoked.     Resp Syncytial Virus by PCR 12/31/2021 NEGATIVE  NEGATIVE Final   Comment: (NOTE) Fact Sheet for  Patients: EntrepreneurPulse.com.au  Fact Sheet for Healthcare Providers: IncredibleEmployment.be  This test is not yet approved or cleared by the [place] FDA and has been authorized for detection and/or diagnosis of SARS-CoV-2 by FDA under an Emergency Use Authorization (EUA). This EUA will remain in effect (meaning this test can be used) for the duration of the COVID-19 declaration under Section 564(b)(1) of the Act, 21 U.S.C. section 360bbb-3(b)(1), unless the authorization is terminated or revoked.  Performed at [facility], [place] [address], [place], [place] [address]   Office Visit on 12/04/2021  Component Date Value Ref Range Status   Hemoglobin A1C 12/04/2021 5.6  4.0 - 5.6 % Final   HbA1c POC (<> result, manual entry) 12/04/2021 5.6  4.0 - 5.6 % Final   HbA1c, POC (prediabetic range) 12/04/2021 5.6  5.7 - 6.4 % Final   HbA1c, POC (controlled diabetic ra* 12/04/2021 5.6  0.0 - 7.0 % Final  Telephone on 11/30/2021  Component Date Value Ref Range Status   Fecal Occult Bld 12/26/2021 Negative  Negative Final  Office Visit on 11/24/2021  Component Date Value Ref Range Status   Glucose 11/24/2021 100 (H)  70 - 99 mg/dL Final   BUN 11/24/2021 18  6 - 24 mg/dL Final   Creatinine, Ser 11/24/2021 1.53 (H)  0.76 - 1.27 mg/dL Final   eGFR 11/24/2021 52 (L)  >59 mL/min/1.73 Final   BUN/Creatinine Ratio 11/24/2021 12  9 - 20 Final   Sodium 11/24/2021 143  134 - 144 mmol/L Final   Potassium 11/24/2021 4.2  3.5 - 5.2 mmol/L Final   Chloride 11/24/2021 106  96 - 106 mmol/L Final   CO2 11/24/2021 24  20 - 29 mmol/L Final   Calcium 11/24/2021 9.0  8.7 - 10.2 mg/dL Final  Community Documentation on 10/04/2021  Component Date Value Ref Range Status   POC Glucose 10/04/2021 121 (A)  70 - 99 mg/dl Final  Admission on 09/04/2021, Discharged on 09/04/2021  Component Date Value Ref Range Status   Sodium 09/04/2021 135  135 - 145  mmol/L Final   Potassium 09/04/2021 3.4 (L)  3.5 - 5.1 mmol/L Final   Chloride 09/04/2021 104  98 - 111 mmol/L Final   CO2 09/04/2021 26  22 - 32 mmol/L Final   Glucose, Bld 09/04/2021 141 (H)  70 - 99 mg/dL Final   Glucose reference range applies only to samples taken after fasting for at least 8 hours.   BUN 09/04/2021 15  6 - 20 mg/dL Final   Creatinine, Ser 09/04/2021 1.22  0.61 - 1.24 mg/dL Final   Calcium 09/04/2021 8.9  8.9 - 10.3 mg/dL Final   Total Protein 09/04/2021 6.8  6.5 - 8.1 g/dL Final   Albumin 09/04/2021 3.7  3.5 - 5.0 g/dL Final   AST 09/04/2021 19  15 - 41 U/L Final   ALT 09/04/2021 11  0 - 44 U/L Final   Alkaline Phosphatase 09/04/2021  70  38 - 126 U/L Final   Total Bilirubin 09/04/2021 0.6  0.3 - 1.2 mg/dL Final   GFR, Estimated 09/04/2021 >60  >60 mL/min Final   Comment: (NOTE) Calculated using the CKD-EPI Creatinine Equation (2021)    Anion gap 09/04/2021 5  5 - 15 Final   Performed at [facility], [place] [address] [place], [place] [address]   WBC 09/04/2021 6.8  4.0 - 10.5 K/uL Final   RBC 09/04/2021 4.63  4.22 - 5.81 MIL/uL Final   Hemoglobin 09/04/2021 13.3  13.0 - 17.0 g/dL Final   HCT 09/04/2021 39.5  39.0 - 52.0 % Final   MCV 09/04/2021 85.3  80.0 - 100.0 fL Final   MCH 09/04/2021 28.7  26.0 - 34.0 pg Final   MCHC 09/04/2021 33.7  30.0 - 36.0 g/dL Final   RDW 09/04/2021 15.2  11.5 - 15.5 % Final   Platelets 09/04/2021 231  150 - 400 K/uL Final   nRBC 09/04/2021 0.0  0.0 - 0.2 % Final   Neutrophils Relative % 09/04/2021 65  % Final   Neutro Abs 09/04/2021 4.3  1.7 - 7.7 K/uL Final   Lymphocytes Relative 09/04/2021 27  % Final   Lymphs Abs 09/04/2021 1.8  0.7 - 4.0 K/uL Final   Monocytes Relative 09/04/2021 7  % Final   Monocytes Absolute 09/04/2021 0.5  0.1 - 1.0 K/uL Final   Eosinophils Relative 09/04/2021 1  % Final   Eosinophils Absolute 09/04/2021 0.1  0.0 - 0.5 K/uL Final   Basophils Relative 09/04/2021 0  % Final    Basophils Absolute 09/04/2021 0.0  0.0 - 0.1 K/uL Final   Immature Granulocytes 09/04/2021 0  % Final   Abs Immature Granulocytes 09/04/2021 0.01  0.00 - 0.07 K/uL Final   Performed at [facility], [place] [address], [place], [place] [address]   Color, Urine 09/04/2021 YELLOW  YELLOW Final   APPearance 09/04/2021 CLEAR  CLEAR Final   Specific Gravity, Urine 09/04/2021 >1.030 (H)  1.005 - 1.030 Final   pH 09/04/2021 5.5  5.0 - 8.0 Final   Glucose, UA 09/04/2021 NEGATIVE  NEGATIVE mg/dL Final   Hgb urine dipstick 09/04/2021 LARGE (A)  NEGATIVE Final   Bilirubin Urine 09/04/2021 SMALL (A)  NEGATIVE Final   Ketones, ur 09/04/2021 NEGATIVE  NEGATIVE mg/dL Final   Protein, ur 09/04/2021 >300 (A)  NEGATIVE mg/dL Final   Nitrite 09/04/2021 POSITIVE (A)  NEGATIVE Final   Leukocytes,Ua 09/04/2021 MODERATE (A)  NEGATIVE Final   Performed at [facility], [place] [address], [place], [place] [address]   RBC / HPF 09/04/2021 >50  0 - 5 RBC/hpf Final   WBC, UA 09/04/2021 21-50  0 - 5 WBC/hpf Final   Bacteria, UA 09/04/2021 FEW (A)  NONE SEEN Final   Squamous Epithelial / LPF 09/04/2021 NONE SEEN  0 - 5 Final   Ca Oxalate Crys, UA 09/04/2021 PRESENT   Final   Performed at [facility], [place] [address], [place], [place] [address]   Specimen Description 09/04/2021    Final                   Value:URINE, CLEAN CATCH Performed at [facility], [place] [address] [place], [place] [address]    Special Requests 09/04/2021    Final                   Value:NONE Performed at [facility],  [place] [address] [place], [place] [address]    Culture 09/04/2021 >=100,000 COLONIES/mL SERRATIA MARCESCENS (A)   Final   Report Status 09/04/2021 09/07/2021 FINAL   Final   Organism ID, Bacteria 09/04/2021 SERRATIA MARCESCENS (A)   Final  Admission on 09/01/2021, Discharged on 09/01/2021  Component Date Value Ref Range Status   WBC  09/01/2021 4.9  4.0 - 10.5 K/uL Final   RBC 09/01/2021 5.09  4.22 - 5.81 MIL/uL Final   Hemoglobin 09/01/2021 14.5  13.0 - 17.0 g/dL Final   HCT 09/01/2021 43.4  39.0 - 52.0 % Final   MCV 09/01/2021 85.3  80.0 - 100.0 fL Final   MCH 09/01/2021 28.5  26.0 - 34.0 pg Final   MCHC 09/01/2021 33.4  30.0 - 36.0 g/dL Final   RDW 09/01/2021 15.0  11.5 - 15.5 % Final   Platelets 09/01/2021 230  150 - 400 K/uL Final   nRBC 09/01/2021 0.0  0.0 - 0.2 % Final   Neutrophils Relative % 09/01/2021 61  % Final   Neutro Abs 09/01/2021 2.9  1.7 - 7.7 K/uL Final   Lymphocytes Relative 09/01/2021 32  % Final   Lymphs Abs 09/01/2021 1.6  0.7 - 4.0 K/uL Final   Monocytes Relative 09/01/2021 6  % Final   Monocytes Absolute 09/01/2021 0.3  0.1 - 1.0 K/uL Final   Eosinophils Relative 09/01/2021 1  % Final   Eosinophils Absolute 09/01/2021 0.0  0.0 - 0.5 K/uL Final   Basophils Relative 09/01/2021 0  % Final   Basophils Absolute 09/01/2021 0.0  0.0 - 0.1 K/uL Final   Immature Granulocytes 09/01/2021 0  % Final   Abs Immature Granulocytes 09/01/2021 0.01  0.00 - 0.07 K/uL Final   Performed at [facility], [place] [address], [place], [place] [address]   Sodium 09/01/2021 140  135 - 145 mmol/L Final   Potassium 09/01/2021 3.7  3.5 - 5.1 mmol/L Final   Chloride 09/01/2021 107  98 - 111 mmol/L Final   CO2 09/01/2021 27  22 - 32 mmol/L Final   Glucose, Bld 09/01/2021 95  70 - 99 mg/dL Final   Glucose reference range applies only to samples taken after fasting for at least 8 hours.   BUN 09/01/2021 10  6 - 20 mg/dL Final   Creatinine, Ser 09/01/2021 1.09  0.61 - 1.24 mg/dL Final   Calcium 09/01/2021 9.3  8.9 - 10.3 mg/dL Final   GFR, Estimated 09/01/2021 >60  >60 mL/min Final   Comment: (NOTE) Calculated using the CKD-EPI Creatinine Equation (2021)    Anion gap 09/01/2021 6  5 - 15 Final   Performed at [facility], [place] [address] [place], [place] [address]   Color, Urine  09/01/2021 COLORLESS (A)  YELLOW Final   APPearance 09/01/2021 CLEAR  CLEAR Final   Specific Gravity, Urine 09/01/2021 1.002 (L)  1.005 - 1.030 Final   pH 09/01/2021 7.0  5.0 - 8.0 Final   Glucose, UA 09/01/2021 NEGATIVE  NEGATIVE mg/dL Final   Hgb urine dipstick 09/01/2021 SMALL (A)  NEGATIVE Final   Bilirubin Urine 09/01/2021 NEGATIVE  NEGATIVE Final   Ketones, ur 09/01/2021 NEGATIVE  NEGATIVE mg/dL Final   Protein, ur 09/01/2021 NEGATIVE  NEGATIVE mg/dL Final   Nitrite 09/01/2021 NEGATIVE  NEGATIVE Final   Leukocytes,Ua 09/01/2021 NEGATIVE  NEGATIVE Final   RBC / HPF 09/01/2021 0-5  0 - 5 RBC/hpf Final   WBC, UA 09/01/2021 0-5  0 - 5 WBC/hpf Final  Bacteria, UA 09/01/2021 NONE SEEN  NONE SEEN Final   Performed at [place] [address], [place], [place] [address]  Admission on 08/10/2021, Discharged on 08/10/2021  Component Date Value Ref Range Status   SARS Coronavirus 2 08/10/2021 NEGATIVE  NEGATIVE Final   Comment: (NOTE) SARS-CoV-2 target nucleic acids are NOT DETECTED.  The SARS-CoV-2 RNA is generally detectable in upper and lower respiratory specimens during the acute phase of infection. Negative results do not preclude SARS-CoV-2 infection, do not rule out co-infections with other pathogens, and should not be used as the sole basis for treatment or other patient management decisions. Negative results must be combined with clinical observations, patient history, and epidemiological information. The expected result is Negative.  Fact Sheet for Patients: SugarRoll.be  Fact Sheet for Healthcare Providers: https://www.woods-mathews.com/  This test is not yet approved or cleared by the [place] FDA and  has been authorized for detection and/or diagnosis of SARS-CoV-2 by FDA under an Emergency Use Authorization (EUA). This EUA will remain  in effect (meaning this test can be used) for the duration of  the COVID-19 declaration under Se                          ction 564(b)(1) of the Act, 21 U.S.C. section 360bbb-3(b)(1), unless the authorization is terminated or revoked sooner.  Performed at [facility], [place] [address], [place], [place] [address]   Orders Only on 07/06/2021  Component Date Value Ref Range Status   Magnesium 07/07/2021 2.2  1.6 - 2.3 mg/dL Final   Glucose 07/07/2021 79  70 - 99 mg/dL Final   BUN 07/07/2021 13  6 - 24 mg/dL Final   Creatinine, Ser 07/07/2021 1.13  0.76 - 1.27 mg/dL Final   eGFR 07/07/2021 75  >59 mL/min/1.73 Final   BUN/Creatinine Ratio 07/07/2021 12  9 - 20 Final   Sodium 07/07/2021 139  134 - 144 mmol/L Final   Potassium 07/07/2021 3.7  3.5 - 5.2 mmol/L Final   Chloride 07/07/2021 102  96 - 106 mmol/L Final   CO2 07/07/2021 24  20 - 29 mmol/L Final   Calcium 07/07/2021 9.1  8.7 - 10.2 mg/dL Final    Allergies: Losartan  PTA Medications: (Not in a hospital admission)   Medical Decision Making  Seferino Oscar was admitted to [facility] continuous assessment unit for Involuntary commitment, crisis management, and stabilization. Routine labs ordered, which include  Lab Orders         Resp panel by RT-PCR (RSV, Flu A&B, Covid) Anterior Nasal Swab         CBC with Differential/Platelet         Comprehensive metabolic panel         Hemoglobin A1c         Magnesium         Ethanol         Lipid panel         TSH         Prolactin         Urinalysis, Routine w reflex microscopic Urine, Clean Catch         POCT Urine Drug Screen - (I-Screen)    Medication Management: Medications started Meds ordered this encounter  Medications   acetaminophen (TYLENOL) tablet 650 mg   alum & mag hydroxide-simeth (MAALOX/MYLANTA) 200-200-20 MG/5ML suspension 30 mL   magnesium hydroxide (MILK OF MAGNESIA) suspension 30  mL   amLODipine (NORVASC) tablet 5 mg   azelastine (ASTELIN) 0.1 % nasal spray 2 spray   chlorhexidine  (PERIDEX) 0.12 % solution 15 mL   vitamin B-12 (CYANOCOBALAMIN) tablet 100 mcg   cyclobenzaprine (FLEXERIL) tablet 10 mg   FLUoxetine (PROZAC) capsule 20 mg   fluticasone (FLONASE) 50 MCG/ACT nasal spray 2 spray   QUEtiapine (SEROQUEL) tablet 100 mg   oxybutynin (OXYTROL) 3.9 MG/24HR patch 1 patch   Vitamin D (Ergocalciferol) (DRISDOL) 1.25 MG (50000 UNIT) capsule 50,000 Units   gabapentin (NEURONTIN) capsule 600 mg   divalproex (DEPAKOTE ER) 24 hr tablet 500 mg  Will maintain continuous observation for safety. Social work will consult with family for collateral information and discuss discharge and follow up plan.    Recommendations  Based on my evaluation the patient does not appear to have an emergency medical condition.  Semiah Konczal, NP 01/03/22  3:20 PM

## 2022-01-03 NOTE — ED Notes (Signed)
Pt given snack. 

## 2022-01-03 NOTE — Telephone Encounter (Signed)
The patient called again and said that the last letter he got from Dr Delford Field went to the court for his hearing.  He now has to start over. He was not clear exactly what he meant by starting over.    He said he is working with, Onalee Hua,  an " advocate" but could not tell me what agency Onalee Hua work for. When I asked if he needed a new PCS referral he said yes.  I told him that I would complete the referral for Dr Delford Field to sign and then submit to the agency that manages PCS.

## 2022-01-03 NOTE — ED Notes (Signed)
Pt was given dinner. 

## 2022-01-03 NOTE — Telephone Encounter (Signed)
Pt called reporting that Medicaid needs a letter from his PCP stating that he needs home health re-certification please advise.   Pt would also like to speak to Erskine Squibb

## 2022-01-03 NOTE — BH Assessment (Signed)
Comprehensive Clinical Assessment (CCA) Note  01/03/2022 Jeremiah Hunter [id number]  Disposition: Per Delphia Grates, NP, patient recommended for overnight observation.   The patient demonstrates the following risk factors for suicide: Chronic risk factors for suicide include: psychiatric disorder of schizoaffective disorder . Acute risk factors for suicide include: N/A. Protective factors for this patient include: positive therapeutic relationship. Considering these factors, the overall suicide risk at this point appears to be low. Patient is not appropriate for outpatient follow up.  Chief Complaint:  Chief Complaint  Patient presents with   IVC   Visit Diagnosis:  Involuntary commitment  Schizoaffective disorder, bipolar type ([place])       CCA Screening, Triage and Referral (STR)  Patient Reported Information How did you hear about [place]? Legal System  What Is the Reason for Your Visit/Call Today? Pt presents to [facility] under IVC escorted by [employer]. Per IVC "[name] has been diagnosed with schizo-affective disorder and bipolar type. He called the crisis line stating he believes the police is watching him. [name] threatened Jeremiah Hunter stating he is going to kill her. 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 Pt denies SI/HI and AVH.  Patient reports he called ACTT crisis line last night and told them that he wanted his aunt removed from his contact list because she is "trying to go against me". Patient reports the case manager told him it was not a crisis and patient stated it would be a crisis if he killed her. Patient reports frustration from ACTT not following up with his request to remove family from his contact list. Patient also reports that his neighbors' dogs are running around the neighborhood without a leash and one of the dogs attempted to attack him. Patient  reports tell the leasing office and calling animal control about the dogs. Patient now feels like the dogs belong to the police and they are trying to kill him. Patient reports he was incarcerated for 26 years because someone planted 3 ounces of cocaine on him and while in jail, he was beat by the CO's and nursing staff was experimenting on him and he believes they are trying to kill him.  TTS received collateral from Case manger Jeremiah Hunter who reports that patient has been calling since yesterday, agitated asking that Jeremiah Hunter be removed from an ROI he signed for her last week. Jeremiah Hunter reports that patient stated that he was going to kill Jeremiah Hunter if she does not stay out of his business. Patient also told Jeremiah Hunter that he was going to set his neighbors car on fire. Jeremiah Hunter reports that patient has been paranoid since she first met him in August, thinking that people are watching him. Jeremiah Hunter reports that patient has been refusing to take his weekly pill packs. Patient meets with ACTT 1 to 2 times a week and they deliver his medications to him. ACTT plans on following up with patient tomorrow if discharged.     Patient present with paranoid delusions stating that he does not use ice because he thinks it is cocaine, thinking that the police is trying to kill him through use of dogs and patient also reports that he is the president. Patient can state the correct president. Patient is engaged and pleasant, he makes jokes at times and is also tearful during assessment. Patient denies SI, HI, AVH and contracts for safety. Patient denies drug use however,  a week ago he reports attempting to smoke THC.   How Long Has This Been Causing You Problems? <Week  What Do You Feel Would Help You the Most Today? Treatment for Depression or other mood problem   Have You Recently Had Any Thoughts About Hurting Yourself? No  Are You Planning to Commit Suicide/Harm Yourself At This time? No   Flowsheet Row ED from 01/03/2022 in [facility] ED from 12/31/2021 in [facility] DEPT ED from 12/08/2021 in [facility] No Risk No Risk No Risk       Have you Recently Had Thoughts About Jeremiah Hunter? No  Are You Planning to Harm Someone at This Time? No  Explanation: NA   Have You Used Any Alcohol or Drugs in the Past 24 Hours? No  What Did You Use and How Much? NA   Do You Currently Have a Therapist/Psychiatrist? Yes  Name of Therapist/Psychiatrist: Name of Therapist/Psychiatrist: [facility]   Have You Been Recently Discharged From Any Office Practice or Programs? No  Explanation of Discharge From Practice/Program: NA     CCA Screening Triage Referral Assessment Type of Contact: Face-to-Face  Telemedicine Service Delivery:   Is this Initial or Reassessment?   Date Telepsych consult ordered in [facility]:    Time Telepsych consult ordered in [facility]:    Location of Assessment: [facility] Assessment Services  Provider Location: [facility] Assessment Services   Collateral Involvement: [facility] CASE MANAGER   Does Patient Have a Stage manager Guardian? No  Legal Guardian Contact Information: NA  Copy of Legal Guardianship Form: -- (NA)  Legal Guardian Notified of Arrival: -- (NA)  Legal Guardian Notified of Pending Discharge: -- (NA)  If Minor and Not Living with Parent(s), Who has Custody? NA  Is CPS involved or ever been involved? Never  Is APS involved or ever been involved? Never   Patient Determined To Be At Risk for Harm To Self or Others Based on Review of Patient Reported Information or Presenting Complaint? No  Method: No Plan  Availability of Means: No access or NA  Intent: Vague intent or NA  Notification Required: No need or identified person  Additional Information for Danger to Others Potential: Active psychosis  Additional Comments for Danger to Others Potential: NA  Are  There Guns or Other Weapons in Your Home? No  Types of Guns/Weapons: NA  Are These Weapons Safely Secured?                            No  Who Could Verify You Are Able To Have These Secured: NA  Do You Have any Outstanding Charges, Pending Court Dates, Parole/Probation? NO  Contacted To Inform of Risk of Harm To Self or Others: Unable to Contact:    Does Patient Present under Involuntary Commitment? Yes    [place] of Residence: [place]   Patient Currently Receiving the Following Services: ACTT Architect)   Determination of Need: Urgent (48 hours)   Options For Referral: [facility]; Medication Management; Outpatient Therapy     CCA Biopsychosocial Patient Reported Schizophrenia/Schizoaffective Diagnosis in Past: Yes   Strengths: MOTIVATED   Mental Health Symptoms Depression:   Tearfulness; Change in energy/activity   Duration of Depressive symptoms:  Duration of Depressive Symptoms: Greater than two weeks   Mania:   N/A   Anxiety:  Worrying   Psychosis:   Delusions   Duration of Psychotic symptoms:  Duration of Psychotic Symptoms: Greater than six months   Trauma:   None   Obsessions:   None   Compulsions:   None   Inattention:   None   Hyperactivity/Impulsivity:   None   Oppositional/Defiant Behaviors:   None   Emotional Irregularity:   None   Other Mood/Personality Symptoms:   NA    Mental Status Exam Appearance and self-care  Stature:   Tall   Weight:   Average weight   Clothing:   Neat/clean; Age-appropriate   Grooming:   Normal   Cosmetic use:   None   Posture/gait:   Normal   Motor activity:   Not Remarkable   Sensorium  Attention:   Normal   Concentration:   Normal   Orientation:   Person; Situation; Place   Recall/memory:   Normal   Affect and Mood  Affect:   Appropriate   Mood:   Anxious   Relating  Eye contact:   Normal   Facial expression:    Responsive   Attitude toward examiner:   Cooperative; Copy and Language  Speech flow:  Pressured   Thought content:   Delusions; Persecutions   Preoccupation:   None   Hallucinations:   None   Organization:   Audiological scientist of Knowledge:   Fair   Intelligence:   Average   Abstraction:   Normal   Judgement:   Poor   Reality Testing:   Variable   Insight:   Gaps   Decision Making:   Normal   Social Functioning  Social Maturity:   Responsible   Social Judgement:   Normal   Stress  Stressors:   Family conflict; Relationship   Coping Ability:   Overwhelmed; Exhausted   Skill Deficits:   None   Supports:   Friends/Service system     Religion: Religion/Spirituality Are You A Religious Person?: Yes What is Your Religious Affiliation?: Christian How Might This Affect Treatment?: NA  Leisure/Recreation: Leisure / Recreation Do You Have Hobbies?: No  Exercise/Diet: Exercise/Diet Do You Exercise?: No Have You Gained or Lost A Significant Amount of Weight in the Past Six Months?: No Do You Follow a Special Diet?: No Do You Have Any Trouble Sleeping?: Yes Explanation of Sleeping Difficulties: POOR SLEEP   CCA Employment/Education Employment/Work Situation: Employment / Work Situation Employment Situation: On disability Why is Patient on Disability: MENTAL HEALTH How Long has Patient Been on Disability: UTA Patient's Job has Been Impacted by Current Illness: No  Education: Education Is Patient Currently Attending School?: No Last Grade Completed:  (UTA) Did You Attend College?:  (UTA) Did You Have An Individualized Education Program (IIEP):  (NA) Did You Have Any Difficulty At School?:  (NA) Patient's Education Has Been Impacted by Current Illness:  (NA)   CCA Family/Childhood History Family and Relationship History: Family history Marital status: Single Does patient have children?:  Yes How many children?:  (UTA) How is patient's relationship with their children?: UTA  Childhood History:  Childhood History By whom was/is the patient raised?:  (UTA) Did patient suffer any verbal/emotional/physical/sexual abuse as a child?:  (UTA) Did patient suffer from severe childhood neglect?:  (UTA) Has patient ever been sexually abused/assaulted/raped as an adolescent or adult?:  (UTA) Was the patient ever a victim of a crime or a disaster?:  (UTA) Witnessed domestic violence?:  (UTA) Has patient been affected  by domestic violence as an adult?:  (UTA)       CCA Substance Use Alcohol/Drug Use: Alcohol / Drug Use Pain Medications: Denies abuse Prescriptions: Denies abuse Over the Counter: Denies abuse History of alcohol / drug use?: No history of alcohol / drug abuse Longest period of sobriety (when/how long): NA                         ASAM's:  Six Dimensions of Multidimensional Assessment  Dimension 1:  Acute Intoxication and/or Withdrawal Potential:      Dimension 2:  Biomedical Conditions and Complications:      Dimension 3:  Emotional, Behavioral, or Cognitive Conditions and Complications:     Dimension 4:  Readiness to Change:     Dimension 5:  Relapse, Continued use, or Continued Problem Potential:     Dimension 6:  Recovery/Living Environment:     ASAM Severity Score:    ASAM Recommended Level of Treatment:     Substance use Disorder (SUD)    Recommendations for Services/Supports/Treatments:    Discharge Disposition: Discharge Disposition Disposition of Patient: Admit Mode of transportation if patient is discharged/movement?: Other (comment)  DSM5 Diagnoses: Patient Active Problem List   Diagnosis Date Noted   Schizoaffective disorder, bipolar type ([place]) 01/03/2022   Involuntary commitment 01/03/2022   Myelomalacia of cervical cord ([place]) 12/06/2021   Other spondylosis with myelopathy, cervical region 10/10/2021   Cervical spine  disease 10/03/2021   Urinary retention 09/04/2021   Leg cramps 06/29/2021   Aphthous ulcer 06/21/2021   Vitamin D deficiency 06/13/2021   Pain due to onychomycosis of toenails of both feet 03/13/2021   Itching 01/26/2021   Frequent falls 12/08/2020   Lumbar foraminal stenosis 12/07/2020   DDD (degenerative disc disease), lumbar    Prolapsed internal hemorrhoids, grade 3 09/25/2019   Meralgia paresthetica of left side 09/11/2019   Bipolar affective disorder, currently depressed, moderate ([place]) 09/11/2019   External hemorrhoids with complication 61/22/4497   Overactive bladder 06/11/2019   OSA (obstructive sleep apnea) 03/24/2019   Essential hypertension 03/24/2019   Seizure disorder ([place]) 03/24/2019     Referrals to Alternative Service(s): Referred to Alternative Service(s):   Place:   Date:   Time:    Referred to Alternative Service(s):   Place:   Date:   Time:    Referred to Alternative Service(s):   Place:   Date:   Time:    Referred to Alternative Service(s):   Place:   Date:   Time:     Luther Redo, [facility]

## 2022-01-03 NOTE — ED Notes (Signed)
Shuvon NP asked if Nursing Supervisor could be called to find out if pt could have a walker or his cane.   Sheria Lang RN nursing supervisor will is being called.

## 2022-01-03 NOTE — ED Triage Notes (Signed)
Pt presents to [facility] under IVC escorted by [employer]. Per IVC "[name] has been diagnosed with schizo-affective disorder and bipolar type. He called the crisis line stating he believes the police is watching him. [name] threatened Stanton Kidney stating he is going to kill her. 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 Pt denies SI/HI and AVH.

## 2022-01-03 NOTE — Progress Notes (Signed)
Received Jeremiah Hunter in the OBS area after the admission process. He was oriented to his new environment and made comfortable. He was allowed to write his numbers down from his phone and his phone was returned to his locker. He endorsed feeling depressed and anxious, denied feeling suicidal. He remained visible in the milieu and social with his peers.

## 2022-01-03 NOTE — ED Notes (Signed)
Pt was wanded and searched by security.  He was oppositional and argumentative while attempting to do his skin assessment and search.  Pt would not cooperate with picking up his shirt and said he was unable to take off his socks.  Pt's shirt was lifted up and appeared to be WNL   no abnormalities noted.  Pt was escorted to unit and helped to sit down on his bed.  His shoe and cane were removed and locked in storage. Pt continues to complain.  He was offered a food and something to drink .  Awaiting provider to speak with pt.

## 2022-01-03 NOTE — ED Notes (Signed)
Pt needs assistance walking to bathroom. Right leg can not bare weight.

## 2022-01-03 NOTE — ED Notes (Signed)
Multiple attempts were made to have guest take his Scheduled medications. He refused all medications except for his scheduled Gabapentin and PRN Cyclobenzoprine.

## 2022-01-03 NOTE — ED Provider Notes (Deleted)
Behavioral Health Urgent Care Medical Screening Exam  Patient Name: Jeremiah Hunter MRN: [id number] Date of Evaluation: 01/03/22 Chief Complaint:   Diagnosis:  Final diagnoses:  Involuntary commitment  Schizoaffective disorder, bipolar type (HCC)    History of Present illness: Jeremiah Hunter is a 61 y.o. male patient presented to [facility] as a walk via [employer] under [employer] petition by his case Production designer, theatre/television/film.  Per IVC:  [name] has been diagnosed with schizo-affective disorder and bipolar type.  He called the crisis line stating he believes the police is watching him.  [name] threatened Stanton Kidney stating he is going to kill her.  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 Gretta Cool; and chart reviewed on 01/03/22.  On evaluation Jeremiah Hunter reports not sure why he was brought in.  When asked about statements on IVC patient states that he did call there crisis line "I done told them to take Stanton Kidney off of my contact list and not to give her no mo information about me.  I don't need them telling her nothing about me.  Not when family against family.  She supposed to be on my side.  And my right to have her removed.  I been telling them to remove her for 3 months now.  Today I don't them if they don't take her off I was going to kill the bitch."  Patient states that he has no intent or plan to kill anyone.  "I was just upset cause I got to keep telling them to remove her from the list."  Patient states that one of the newer tents in the apartment complex owns 2 dogs and don't have on a leash.  States he has been chased by the dogs and was almost bitten one time, While running from dog fell and EMS has to be called.  Patient states that he has called police and spoke to the landlord about the dogs but the land lord is not paying attention to anything he says.  "When I was in  prison they told me when I get out they was going to kill me.  The dogs must belong to the police and the police are watching me to kill me.  It's got to be the police cause the landlord or no body else is listening to me."  Patient chronic paranoia about police trying to kill him related to "I was in prison for 26 years and I was beat by the [employer] Chief of Staff) they told me when I got out they was going to kill me, and I believe them to."  Always feels that the police are watching him and now that the dogs are in the complex and not on a leash feels that the police put them there.  He has made the statement that he was going to set the [place] on fire if don't "get them damn dogs out of there."  Patient denies suicidal/self-harm/and homicidal ideation.  States statements are made out of anger and frustration.  Patient denies auditory/visual hallucinations.  Patient states he lives in home with his mother and sister who are both supportive.  Reports he has outpatient psychiatric services with [facility] and last visit was yesterday.   During evaluation Jeremiah Hunter is sitting in chair with no noted distress.  He is alert/oriented x 3, calm, cooperative and mood is pleasant,  joking.  When asked about the current month and year he states "it in the 20's and it christmas month."  When asked the name of the current president he state that he is the current president "I'm president of the work.  I want all people to be happy."  He was then asked who does everyone else think the president of the [place] is "Biden, that's what they say."  He joked about the question of age "I'm 61 yrs old and I was born in [birth year]"  Informed that if born in [birth year] he could be 61 yrs old.  "I am 61 yrs old and I was born in room 1615."  He was able to give the correct DOB.  Patient states that he doesn't want to go home wants to stay over night.  Agreed related to IVC, reassess tomorrow.  His thought process is coherent and relevant.  There is  no indication that he is currently responding to internal/external stimuli.  There is some paranoid delusional thinking related to the police are watching and wanting to kill him, and that the 2 dogs in neighborhood belong to the police. He denies suicidal/self-harm/homicidal ideation, and psychosis.  He doesn't admit that he has paranoia.     Flowsheet Row ED from 01/03/2022 in [facility] ED from 12/31/2021 in [place]  [facility] DEPT ED from 12/08/2021 in [facility] at [facility] RISK CATEGORY No Risk No Risk No Risk       Psychiatric Specialty Exam  Presentation  General Appearance:Appropriate for Environment; Casual  Eye Contact:Good  Speech:Clear and Coherent; Normal Rate  Speech Volume:Normal  Handedness:Right   Mood and Affect  Mood: Euthymic  Affect: Congruent   Thought Process  Thought Processes: Coherent; Goal Directed  Descriptions of Associations:Intact  Orientation:Full (Time, Place and Person)  Thought Content:Logical; Paranoid Ideation  Diagnosis of Schizophrenia or Schizoaffective disorder in past: Yes  Duration of Psychotic Symptoms: Greater than six months  Hallucinations:None  Ideas of Reference:Paranoia (Appears to be at baseline (chronic))  Suicidal Thoughts:No  Homicidal Thoughts:No   Sensorium  Memory: Immediate Good; Recent Good  Judgment: Intact  Insight: Present   Executive Functions  Concentration: Fair  Attention Span: Fair  Recall: [employer] of Knowledge: Fair  Language: Good   Psychomotor Activity  Psychomotor Activity: Normal   Assets  Assets: Communication Skills; Desire for Improvement; Financial Resources/Insurance; Housing; Leisure Time; Physical Health; Social Support   Sleep  Sleep: Good  Number of hours: No data recorded  Nutritional Assessment (For OBS and FBC admissions only) Has the patient had a weight loss  or gain of 10 pounds or more in the last 3 months?: No Has the patient had a decrease in food intake/or appetite?: No Does the patient have dental problems?: No Does the patient have eating habits or behaviors that may be indicators of an eating disorder including binging or inducing vomiting?: No Has the patient recently lost weight without trying?: 0 Has the patient been eating poorly because of a decreased appetite?: 0 Malnutrition Screening Tool Score: 0    Physical Exam: Physical Exam Vitals and nursing note reviewed. Exam conducted with a chaperone present.  Constitutional:      General: He is not in acute distress.    Appearance: Normal appearance. He is not ill-appearing.     Comments: Wearing an orthopedic shoe on left foot  HENT:     Head: Normocephalic.  Eyes:  Conjunctiva/sclera: Conjunctivae normal.  Cardiovascular:     Rate and Rhythm: Normal rate.  Musculoskeletal:        General: Normal range of motion.     Cervical back: Normal range of motion.  Skin:    General: Skin is warm and dry.  Neurological:     Mental Status: He is alert and oriented to person, place, and time.  Psychiatric:        Attention and Perception: Attention and perception normal.        Mood and Affect: Mood and affect normal.        Speech: Speech normal.        Behavior: Behavior normal. Behavior is cooperative.        Thought Content: Thought content is paranoid and delusional. Thought content does not include homicidal or suicidal ideation.        Judgment: Judgment is impulsive.    Review of Systems  Constitutional:  Negative for chills and fever.  Respiratory:  Negative for cough, shortness of breath and wheezing.   Cardiovascular:  Negative for chest pain and palpitations.  Gastrointestinal: Negative.   Genitourinary: Negative.   Musculoskeletal:        Orthoptic shoe on left foot and cane to assist with ambulation  Skin: Negative.   Neurological: Negative.    Psychiatric/Behavioral:  Negative for depression, hallucinations and substance abuse (States that he doesn't use drugs but "I tried to smoke marijuana one day last week but I couldn't"). The patient is nervous/anxious. The patient does not have insomnia.    Blood pressure (!) 120/92, pulse 83, temperature 98.2 F (36.8 C), temperature source Oral, resp. rate 18, SpO2 98 %. There is no height or weight on file to calculate BMI.  Musculoskeletal: Strength & Muscle Tone: within normal limits Gait & Station:  Uses a cane to assist with ambulation and an orthopedic shoe on left foot.  Patient leans: N/A   [facility] MSE Discharge Disposition for Follow up and Recommendations: Based on my evaluation the patient does not appear to have an emergency medical condition and can be discharged with resources and follow up care in outpatient services for Medication Management and Individual Therapy   Aunisty Reali, NP 01/03/2022, 2:43 PM

## 2022-01-04 LAB — HEMOGLOBIN A1C
Hgb A1c MFr Bld: 5.8 % — ABNORMAL HIGH (ref 4.8–5.6)
Mean Plasma Glucose: 120 mg/dL

## 2022-01-04 LAB — PROLACTIN: Prolactin: 13.3 ng/mL (ref 4.0–15.2)

## 2022-01-04 NOTE — ED Notes (Signed)
Patient assisted by nursing ambulating to utilize restroom as guest is unable to ambulate safely on his own due to ataxic gait and weakness. Patient states that he utilizes a walker at home.

## 2022-01-04 NOTE — ED Provider Notes (Signed)
FBC/OBS ASAP Discharge Summary  Date and Time: 01/04/2022 12:32 PM  Name: Jeremiah Hunter  MRN:  [id number]   Discharge Diagnoses:  Final diagnoses:  Involuntary commitment  Schizoaffective disorder, bipolar type (HCC)   Subjective:   IVC reviewed. IVC petitioner is Barrington Ellison, [phone], case manager with [facility] ACT team. Per IVC, "[name] has been diagnosed with schizo-affective disorder and bipolar type. He called the crisis line stating he believes the police is watching him. [name] threatened Stanton Kidney stating he is going to kill her. 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 He states at his residence people have their dogs off leash and the dogs might be working with the Engineer, drilling. He denies suicidal, homicidal or violent ideation. He denies auditory visual hallucinations or paranoia. He states he is living with his mother and grandmother. When asked whether I can contact them, he states that they do not have phones. Pt states he has an ACT team although is not sure which agency ACT team is with. He admits to making statement about setting car on fire although denies intent and states statements were made out of frustration. He verbally contracts to safety.  Collateral obtained w/ pt verbal consent from pt's sister, Karilyn Cota, [phone]. Manie reports she spoke with pt yesterday and he is at baseline. She states at baseline, pt has chronic paranoia regarding federal officers are after him. He also has chronic delusions that he is living with his mother and grandmother when they are both deceased. She states she and pt were supposed to go to [place] together to see family and she does not know why he was taken by the police. She is adamant that pt is at his baseline and is not a danger to himself or anyone  else.   Collateral obtained w/ verbal consent from pt's IVC petitioner, Barrington Ellison, [phone]. She states she is unsure of how long pt has been with ACT team. She has been working with team since August. She states pt appeared paranoid yesterday and initiated IVC. Discussed in conversation with pt's sister, Karilyn Cota, pt seems to be at baseline. Pt will be discharged. I recommend close follow up. Jaela verbalized understanding.  Spoke w/ pt's sister, Karilyn Cota, [phone], with pt. Pt will be picked up today by his niece, Clarene Reamer, [phone]. He will stay with Clarene Reamer and his sister Teryl Lucy, [phone]. Should pt experience psychiatric decompensation or there are concerns regarding safety, family will call 911/EMS, go to the nearest emergency room, or the nearest crisis center such as this facility.  Pt gave verbal consent for me to call [facility], [phone]. Attempted several times unsuccessfully.  I called Ronisha, [phone], with pt verbal consent. Ronisha verbalized she has spoken with [facility] and can pick pt up today. She confirms plan that pt will stay with her and her mother Teryl Lucy and family will support and monitor pt. She confirms plan is for pt to spend holiday with her and family.  Stay Summary:  Pt is a 61 y/o male who presents to [facility] on 01[facility]03[facility]22 under IVC petitioned by ACT case Production designer, theatre/television/film. Pt was recommended for continuous assessment. Per collateral on reevaluation, pt appears to be at baseline with chronic paranoia and delusions. Pt denies SI/VI/HI, AVH. He has been discharged to his family with recommendation for close follow up with his ACT team.  Total Time spent  with patient: 1.5 hours  Past Psychiatric History: Per IVC, history of schizoaffective disorder, bipolar type Past Medical History:  Past Medical History:  Diagnosis Date   Acute bilateral low back pain without sciatica 07/30/2019   Allergy    on meds   Anxiety    on meds   DDD (degenerative disc disease), lumbar     on meds   Enlarged prostate    GERD (gastroesophageal reflux disease)    on meds   Hypertension    on meds   Severe episode of recurrent major depressive disorder, with psychotic features ([place]) 03/24/2019   on meds   Sleep apnea    uses CPAP    Past Surgical History:  Procedure Laterality Date   CYSTECTOMY     DENTAL SURGERY     teeth pulled   HERNIA REPAIR     Family History:  Family History  Family history unknown: Yes   Family Psychiatric History: None reported Social History:  Social History   Substance and Sexual Activity  Alcohol Use Never     Social History   Substance and Sexual Activity  Drug Use Never    Social History   Socioeconomic History   Marital status: Single    Spouse name: Not on file   Number of children: Not on file   Years of education: Not on file   Highest education level: Not on file  Occupational History   Not on file  Tobacco Use   Smoking status: Never   Smokeless tobacco: Never  Vaping Use   Vaping Use: Never used  Substance and Sexual Activity   Alcohol use: Never   Drug use: Never   Sexual activity: Not Currently  Other Topics Concern   Not on file  Social History Narrative   Not on file   Social Determinants of Health   Financial Resource Strain: Not on file  Food Insecurity: Not on file  Transportation Needs: Not on file  Physical Activity: Not on file  Stress: Not on file  Social Connections: Not on file   SDOH:  SDOH Screenings   Depression (PHQ2-9): Low Risk  (12/05/2021)  Tobacco Use: Low Risk  (01/03/2022)    Tobacco Cessation:  N/A, patient does not currently use tobacco products  Current Medications:  Current Facility-Administered Medications  Medication Dose Route Frequency Provider Last Rate Last Admin   acetaminophen (TYLENOL) tablet 650 mg  650 mg Oral Q6H PRN Rankin, Shuvon B, NP       alum & mag hydroxide-simeth (MAALOX/MYLANTA) 200-200-20 MG/5ML suspension 30 mL  30 mL Oral Q4H PRN  Rankin, Shuvon B, NP       amLODipine (NORVASC) tablet 5 mg  5 mg Oral Daily Rankin, Shuvon B, NP       azelastine (ASTELIN) 0.1 % nasal spray 2 spray  2 spray Each Nare BID Rankin, Shuvon B, NP       chlorhexidine (PERIDEX) 0.12 % solution 15 mL  15 mL Mouth/Throat BID Rankin, Shuvon B, NP       cyclobenzaprine (FLEXERIL) tablet 10 mg  10 mg Oral TID PRN Rankin, Shuvon B, NP   10 mg at 01/03/22 2134   divalproex (DEPAKOTE ER) 24 hr tablet 500 mg  500 mg Oral BID Rankin, Shuvon B, NP   500 mg at 01/04/22 0940   FLUoxetine (PROZAC) capsule 20 mg  20 mg Oral Daily Rankin, Shuvon B, NP   20 mg at 01/04/22 0941   fluticasone (FLONASE) 50 MCG/ACT  nasal spray 2 spray  2 spray Each Nare Daily Rankin, Shuvon B, NP   2 spray at 01/03/22 1737   gabapentin (NEURONTIN) capsule 600 mg  600 mg Oral TID Rankin, Shuvon B, NP   600 mg at 01/03/22 2134   magnesium hydroxide (MILK OF MAGNESIA) suspension 30 mL  30 mL Oral Daily PRN Rankin, Shuvon B, NP       oxybutynin (OXYTROL) 3.9 MG/24HR patch 1 patch  1 patch Transdermal Q72H Rankin, Shuvon B, NP       QUEtiapine (SEROQUEL) tablet 100 mg  100 mg Oral QHS Rankin, Shuvon B, NP       vitamin B-12 (CYANOCOBALAMIN) tablet 100 mcg  100 mcg Oral Q0600 Rankin, Shuvon B, NP   100 mcg at 01/04/22 [id number]   Vitamin D (Ergocalciferol) (DRISDOL) 1.25 MG (50000 UNIT) capsule 50,000 Units  50,000 Units Oral Q7 days Rankin, Shuvon B, NP       Current Outpatient Medications  Medication Sig Dispense Refill   amLODipine (NORVASC) 5 MG tablet TAKE 1 TABLET(5 MG) BY MOUTH DAILY (Patient taking differently: Take 5 mg by mouth daily. TAKE 1 TABLET(5 MG) BY MOUTH DAILY) 60 tablet 4   chlorhexidine (PERIDEX) 0.12 % solution Use as directed 15 mLs in the mouth or throat 2 (two) times daily. 120 mL 0   cyclobenzaprine (FLEXERIL) 10 MG tablet Take 1 tablet (10 mg total) by mouth 3 (three) times daily as needed for muscle spasms. 90 tablet 1   fluticasone (FLONASE) 50 MCG/ACT nasal spray  Place 2 sprays into both nostrils daily. 16 g 6   gabapentin (NEURONTIN) 300 MG capsule Take 2 capsules (600 mg total) by mouth 3 (three) times daily. 180 capsule 3   OXYTROL 3.9 MG/24HR Apply patch two times a week 8 patch 2   Vitamin D, Ergocalciferol, (DRISDOL) 1.25 MG (50000 UNIT) CAPS capsule Take 1 capsule (50,000 Units total) by mouth every 7 (seven) days. (Patient taking differently: Take 50,000 Units by mouth every Sunday.) 12 capsule 5   divalproex (DEPAKOTE ER) 500 MG 24 hr tablet Take 1 tablet (500 mg total) by mouth in the morning and at bedtime. (Patient not taking: Reported on 12/26/2021) 60 tablet 4   FLUoxetine (PROZAC) 20 MG capsule Take 1 capsule (20 mg total) by mouth daily. (Patient not taking: Reported on 12/26/2021) 60 capsule 2   QUEtiapine (SEROQUEL) 100 MG tablet Take 1 tablet (100 mg total) by mouth at bedtime. (Patient not taking: Reported on 12/26/2021) 60 tablet 2    PTA Medications: (Not in a hospital admission)      11 /14/2023    9:01 AM 08/02/2021    4:55 PM 07/17/2021    2:42 PM  Depression screen PHQ 2/9  Decreased Interest 0 3 2  Down, Depressed, Hopeless 0 3 2  PHQ - 2 Score 0 6 4  Altered sleeping 0 3 0  Tired, decreased energy 0 0 3  Change in appetite 0 3 0  Feeling bad or failure about yourself  0 0 3  Trouble concentrating 0 0 3  Moving slowly or fidgety/restless 0 0 0  Suicidal thoughts 0 0 0  PHQ-9 Score 0 12 13  Difficult doing work/chores   Not difficult at all    [place] ED from 01/03/2022 in [facility] ED from 12/31/2021 in [place] DEPT ED from 12/08/2021 in [facility] [place] No Risk No Risk No Risk  Musculoskeletal  Strength & Muscle Tone:  pt reports he ambulates with walker at home [place]:  pt reports he ambulates with walker at home Patient leans:  pt reports he ambulates with walker at home  Psychiatric  Specialty Exam  Presentation  General Appearance:  Appropriate for Environment; Other (comment) (dressed in unit scrubs)  Eye Contact: Good  Speech: Clear and Coherent; Other (comment) (mildly pressured, redirectable)  Speech Volume: Normal  Handedness: Right   Mood and Affect  Mood: Anxious  Affect: Other (comment) (mildly labile, redirectable)   Thought Process  Thought Processes: Coherent  Descriptions of Associations:Intact  Orientation:Full (Time, Place and Person)  Thought Content:Paranoid Ideation  Diagnosis of Schizophrenia or Schizoaffective disorder in past: Yes  Duration of Psychotic Symptoms: Greater than six months   Hallucinations:Hallucinations: None  Ideas of Reference:Paranoia; Delusions  Suicidal Thoughts:Suicidal Thoughts: No  Homicidal Thoughts:Homicidal Thoughts: No   Sensorium  Memory: Immediate Fair  Judgment: Intact  Insight: Present   Executive Functions  Concentration: Fair  Attention Span: Fair  Recall: [place] of Knowledge: Fair  Language: Good   Psychomotor Activity  Psychomotor Activity: Psychomotor Activity: Normal   Assets  Assets: Communication Skills; Desire for Improvement; Financial Resources/Insurance; Housing; Leisure Time; Physical Health; Social Support   Sleep  Sleep: Sleep: Good   Nutritional Assessment (For OBS and FBC admissions only) Has the patient had a weight loss or gain of 10 pounds or more in the last 3 months?: No Has the patient had a decrease in food intake/or appetite?: No Does the patient have dental problems?: No Does the patient have eating habits or behaviors that may be indicators of an eating disorder including binging or inducing vomiting?: No Has the patient recently lost weight without trying?: 0 Has the patient been eating poorly because of a decreased appetite?: 0 Malnutrition Screening Tool Score: 0    Physical Exam  Physical Exam Constitutional:       General: He is not in acute distress.    Appearance: He is not ill-appearing, toxic-appearing or diaphoretic.  Eyes:     General: No scleral icterus. Cardiovascular:     Rate and Rhythm: Normal rate.  Pulmonary:     Effort: Pulmonary effort is normal. No respiratory distress.  Neurological:     Mental Status: He is alert and oriented to person, place, and time.  Psychiatric:        Mood and Affect: Mood is anxious. Affect is labile.        Behavior: Behavior is cooperative.        Thought Content: Thought content is paranoid and delusional.    Review of Systems  Constitutional:  Negative for chills and fever.  Respiratory:  Negative for shortness of breath.   Cardiovascular:  Negative for chest pain and palpitations.  Gastrointestinal:  Negative for abdominal pain.  Neurological:  Negative for headaches.  Psychiatric/Behavioral:  The patient is nervous/anxious.    Blood pressure 123/89, pulse 78, temperature 98.2 F (36.8 C), temperature source Oral, resp. rate 18, SpO2 96 %. There is no height or weight on file to calculate BMI.  Demographic Factors:  Male, Living alone, and Unemployed  Loss Factors: NA  Historical Factors: Prior suicide attempts. He reports 1 suicide attempt 40 years ago, attempted overdose.   Risk Reduction Factors:   Sense of responsibility to family and Positive social support  Continued Clinical Symptoms:  Previous Psychiatric Diagnoses and Treatments  Cognitive Features That Contribute To Risk:  Chronic paranoia  and delusions  Suicide Risk:  Minimal: No identifiable suicidal ideation.  Patients presenting with no risk factors but with morbid ruminations; may be classified as minimal risk based on the severity of the depressive symptoms  Plan Of Care/Follow-up recommendations:  Follow up with ACT services  Disposition:  Discharge to family  Tharon Aquas, NP 01/04/2022, 12:32 PM

## 2022-01-04 NOTE — ED Notes (Signed)
Patient observed/assessed in bed/chair resting quietly appearing in no distress and verbalizing no complaints at this time. Will continue to monitor.  

## 2022-01-04 NOTE — ED Notes (Signed)
Pt assisted to the bathroom even though he ambulates with complete gait. Pt states he will fall if he does not have staff with him.

## 2022-01-04 NOTE — ED Notes (Signed)
Pt currently on the phone with family.   No distress noted.

## 2022-01-05 NOTE — Telephone Encounter (Signed)
Pt is calling to follow up with Erskine Squibb regarding letter to be sent Legal Aid.  Pt Reports that he goes to the [place] on Monday  CB Patient CB- [phone]

## 2022-01-08 ENCOUNTER — Telehealth: Payer: Self-pay | Admitting: Emergency Medicine

## 2022-01-08 ENCOUNTER — Encounter: Payer: Self-pay | Admitting: Critical Care Medicine

## 2022-01-08 NOTE — Telephone Encounter (Signed)
noted 

## 2022-01-08 NOTE — Telephone Encounter (Signed)
Noted  

## 2022-01-08 NOTE — Telephone Encounter (Signed)
Copied from CRM [id number]. Topic: General - Other >> Jan 08, 2022 12:25 PM Dondra Prader E wrote: Reason for CRM: Pt called to share the correct contact information for his legal aid.   Best contact: [phone] Jeremiah Hunter   "When the recording plays [employer], then her ext. [phone]"

## 2022-01-08 NOTE — Telephone Encounter (Signed)
I spoke to the patient and he was still not clear about what he needs. He continues to refer to re-instating his personal care services.  I explained that I sent the referral to [facility] last week.   I asked if I could speak to his attorney for further clarification of what is needed and he gave me the number for [facility] [phone]. The patient said that he does not have to go to court today.    I left a message for Hayden Rasmussen, requesting a call back.

## 2022-01-08 NOTE — Telephone Encounter (Signed)
I received a call from Jeremiah Hunter/ attorney- Jamas Lav, Medicaid Transitions Dept.  She explained that when the patient had his annual re-assessment for PCS with [employer] [employer], they determined that he no longer was eligible for PCS. He appealed the decision and the appeal was denied. She said somehow the case was turned over to the state attorney general.    I explained to her that I submitted another PCS referral last week and Diannia Ruder said that her office confirmed with [employer] [employer] that the request was received. Diannia Ruder said that they will wait to see when the patient is scheduled for a new assessment. If that assessment is a month or more away, [facility] will then appeal the denial.  [facility] does not need any information from PCP at this time and she will contact me when/if additional information is needed.

## 2022-01-12 ENCOUNTER — Encounter: Payer: Self-pay | Admitting: Critical Care Medicine

## 2022-01-16 ENCOUNTER — Encounter: Payer: Self-pay | Admitting: Critical Care Medicine

## 2022-01-16 ENCOUNTER — Telehealth: Payer: Self-pay | Admitting: Emergency Medicine

## 2022-01-16 ENCOUNTER — Telehealth: Payer: Self-pay | Admitting: Critical Care Medicine

## 2022-01-16 NOTE — Telephone Encounter (Signed)
Copied from CRM [id number]. Topic: General - Other >> Jan 16, 2022  1:20 PM Dondra Prader E wrote: Reason for CRM: Pt called requesting for Speech Therapy

## 2022-01-16 NOTE — Telephone Encounter (Signed)
I called the patient to follow-up on his recent ER visit and his patient message regarding headaches.  He has stopped tumeric and the headaches have gone away.  He is on gabapentin for his neck and orthopedic spine said no surgical intervention needed.  He went to the ER with suicidal and homicidal ideation and delusional thinking.  They have recommended he stay on Depakote 500 mg twice daily he is no longer on Prozac or Seroquel.  He is followed by the ACT team and they come and check on him frequently.  He will keep his return follow-up with [place] and stay off tumeric for now

## 2022-01-17 ENCOUNTER — Encounter: Payer: Self-pay | Admitting: Critical Care Medicine

## 2022-01-17 ENCOUNTER — Ambulatory Visit: Payer: Self-pay | Admitting: *Deleted

## 2022-01-17 NOTE — Telephone Encounter (Signed)
Reason for Disposition  [1] Rectal bleeding is minimal (e.g., blood just on toilet paper, few drops, streaks on surface of normal formed BM) AND [2] bleeding recurs 3 or more times on treatment  Answer Assessment - Initial Assessment Questions 1. APPEARANCE of BLOOD: "What color is it?" "Is it passed separately, on the surface of the stool, or mixed in with the stool?"      Having blood in my stool.   2 weeks ago I noticed it and it went away and today I'm seeing the blood again.   It's bright red.    2 weeks ago it was in my stool.   Today I only see blood when I wipe. I have hemorrhoids.   I need to have surgery for them but I haven't had a chance to have the surgery.     2. AMOUNT: "How much blood was passed?"      I only see blood when I wipe 3. FREQUENCY: "How many times has blood been passed with the stools?"      2 days straight 2 weeks ago I had blood in my stools.   But this morning I only get the blood when I wipe.  4. ONSET: "When was the blood first seen in the stools?" (Days or weeks)      2 weeks ago 5. DIARRHEA: "Is there also some diarrhea?" If Yes, ask: "How many diarrhea stools in the past 24 hours?"      No 6. CONSTIPATION: "Do you have constipation?" If Yes, ask: "How bad is it?"     No  Mild 7. RECURRENT SYMPTOMS: "Have you had blood in your stools before?" If Yes, ask: "When was the last time?" and "What happened that time?"      Yes 8. BLOOD THINNERS: "Do you take any blood thinners?" (e.g., Coumadin/warfarin, Pradaxa/dabigatran, aspirin)     No 9. OTHER SYMPTOMS: "Do you have any other symptoms?"  (e.g., abdomen pain, vomiting, dizziness, fever)     No abd pain.     Having burning around the hemorrhoids.  10. PREGNANCY: "Is there any chance you are pregnant?" "When was your last menstrual period?"       N/A  Protocols used: Rectal Bleeding-A-AH  Chief Complaint: Rectal bleeding Symptoms: 2 weeks ago blood in stools.   Went away but this morning noticed blood on  toilet paper but not in his stools. Frequency: 3 times total over last 2 wks Pertinent Negatives: Patient denies abd pain or feeling dizzy or weak Disposition: [] ED /[] Urgent Care (no appt availability in office) / [x] Appointment(In office/virtual)/ []  [place] [facility]/ [] Home Care/ [] Refused Recommended Disposition /[] [place] [facility]/ []  Follow-up with PCP Additional Notes: Appt. Made with Dr. for 01/31/2022 at 8:30.

## 2022-01-18 NOTE — Telephone Encounter (Signed)
Pt sends me messages in mychart daily for a variety of new complaints  will wait until next ov to discuss

## 2022-01-19 NOTE — Telephone Encounter (Signed)
Noted patient was sent message regarding this in mychart

## 2022-01-25 ENCOUNTER — Ambulatory Visit (INDEPENDENT_AMBULATORY_CARE_PROVIDER_SITE_OTHER): Payer: Medicaid Other | Admitting: Podiatry

## 2022-01-25 ENCOUNTER — Encounter: Payer: Self-pay | Admitting: Podiatry

## 2022-01-25 DIAGNOSIS — G629 Polyneuropathy, unspecified: Secondary | ICD-10-CM | POA: Diagnosis not present

## 2022-01-25 DIAGNOSIS — M7671 Peroneal tendinitis, right leg: Secondary | ICD-10-CM

## 2022-01-26 NOTE — Progress Notes (Signed)
Subjective:   Patient ID: Jeremiah Hunter, male   DOB: 62 y.o.   MRN: [id number]   HPI Patient presents with concerns about shooting pains that he gets in his foot right over left and burning like tingling pain   ROS      Objective:  Physical Exam  I checked neurovascular status thoroughly did not see a change in it and I noted that there is moderate flatfoot deformity mild discomfort in the lateral side of the right foot and diminished sharp dull vibratory bilateral.  He does have nail disease also which become thick and may be part of his pathology     Assessment:  Patient is in relatively poor health with neuropathy and chronic peroneal tendinitis along with nail disease     Plan:  Reviewed all conditions and at this point I recommended rigid bottom shoes and wider toebox along with thick socks.  I did do courtesy debridement nailbeds and patient will be seen back as needed

## 2022-01-29 ENCOUNTER — Encounter: Payer: Self-pay | Admitting: Critical Care Medicine

## 2022-01-31 ENCOUNTER — Ambulatory Visit: Payer: Medicaid Other | Admitting: Critical Care Medicine

## 2022-01-31 ENCOUNTER — Telehealth: Payer: Self-pay | Admitting: Internal Medicine

## 2022-01-31 ENCOUNTER — Telehealth: Payer: Self-pay | Admitting: Licensed Clinical Social Worker

## 2022-01-31 NOTE — Telephone Encounter (Signed)
Spoke with patient. I have canceled his upcoming appointment with Dr. Annamaria Boots advised if he needed [place] to call back. Nothing further needed.

## 2022-01-31 NOTE — Telephone Encounter (Signed)
Dr. Annamaria Boots patient is wanting to know if he still needs to see you since he no longer on CPAP machine. Please advise

## 2022-01-31 NOTE — Telephone Encounter (Signed)
Jeremiah Hunter is welcome back if he needs our help for anything, but otherwise can cancel appointment

## 2022-02-01 ENCOUNTER — Ambulatory Visit: Payer: Self-pay | Admitting: *Deleted

## 2022-02-01 NOTE — Telephone Encounter (Signed)
Reason for Disposition  [1] Abnormal color is unexplained AND [2] present < 24 hours  Answer Assessment - Initial Assessment Questions 1. COLOR: "What color is it?" "Is that color in part or all of the stool?"     Half dark black and light 2. ONSET: "When was the unusual color first noted?"     Today am 3. CAUSE: "Have you eaten any food or taken any medicine of this color?" Note: See listing in Background Information section.      Yesterday vomiting-once, blueberries, vegetables-black beans 4. OTHER SYMPTOMS: "Do you have any other symptoms?" (e.g., abdomen pain, diarrhea, jaundice, fever).     no  Answer Assessment - Initial Assessment Questions 1. APPEARANCE of BLOOD: "What color is it?" "Is it passed separately, on the surface of the stool, or mixed in with the stool?"      Half stool dark- other half very light 2. AMOUNT: "How much blood was passed?"      Large BM 3. FREQUENCY: "How many times has blood been passed with the stools?"      Once this morning 4. ONSET: "When was the blood first seen in the stools?" (Days or weeks)      This morning 5. DIARRHEA: "Is there also some diarrhea?" If Yes, ask: "How many diarrhea stools in the past 24 hours?"      no 6. CONSTIPATION: "Do you have constipation?" If Yes, ask: "How bad is it?"     no 7. RECURRENT SYMPTOMS: "Have you had blood in your stools before?" If Yes, ask: "When was the last time?" and "What happened that time?"      no 8. BLOOD THINNERS: "Do you take any blood thinners?" (e.g., Coumadin/warfarin, Pradaxa/dabigatran, aspirin)     no 9. OTHER SYMPTOMS: "Do you have any other symptoms?"  (e.g., abdomen pain, vomiting, dizziness, fever)     Reddish in color  Protocols used: Stools - Unusual Color-A-AH, Rectal Bleeding-A-AH

## 2022-02-01 NOTE — Telephone Encounter (Signed)
  Chief Complaint: change in stool color Symptoms: vomited once yesterday, half of stool- black, tarry in color- once today Frequency: started today Pertinent Negatives: Patient denies abdominal pain, changes in bowels- did eat food that could have effected stool color- black beans. Patient will monitor and call for appointment if has reoccurrence Disposition: [] ED /[] Urgent Care (no appt availability in office) / [] Appointment(In office/virtual)/ []  [place] Virtual Care/ [x] Home Care/ [] Refused Recommended Disposition /[] [place] [facility]/ []  Follow-up with PCP Additional Notes: Patient will monitor symptoms- he believes he may have eaten recalled fruit. No abdominal pain-1 episode of vomiting, one episode of dark stool- no other symptoms- will call back if continues- mobile unit advised for follow up if needed.

## 2022-02-05 ENCOUNTER — Ambulatory Visit: Payer: Medicaid Other | Admitting: Internal Medicine

## 2022-02-05 NOTE — Patient Instructions (Signed)
Visit Information  Thank you for taking time to visit with me today. Please don't hesitate to contact me if I can be of assistance to you.   Following are the goals we discussed today:   Goals Addressed             This Visit's Progress    Obtain ACTT Services   On track    Care Coordination Interventions: Solution-Focused Strategies employed:  Active listening / Reflection utilized  Emotional Support Provided Verbalization of feelings encouraged  LCSW introduced self and explained role Patient reports that he is still participating in ACTT through [employer]. Endorsed frustration that they did not complete SNAP re-certification ppwk. He is scheduled to visit DSS office today to rectify ACTT assists with support services (transportation, advocacy, resources) LCSW spoke with Agricultural consultant, assigned to patient, who confirmed she is assisting with transportation and advocating for pt at DSS Pt is awaiting to speak with Psychiatrist about medications. States he is compliant with one med and will inform team that he is not taking the other two meds LCSW encouraged patient to utilize healthy coping skills to regulate strong emotions         Our next appointment is by telephone on 02/07/22 at 10 AM  Please call the care guide team at [phone] if you need to cancel or reschedule your appointment.   If you are experiencing a Mental Health or [place] or need someone to talk to, please call the Suicide and Crisis Lifeline: 988 call 911   Patient verbalizes understanding of instructions and care plan provided today and agrees to view in [place]. Active MyChart status and patient understanding of how to access instructions and care plan via MyChart confirmed with patient.     Christa See, MSW, [email or web address] Phone [phone] 10:21  AM

## 2022-02-05 NOTE — Patient Outreach (Signed)
  Care Coordination Late Entry  Initial Visit Note   Outreach completed 01/31/22 Name: Jeremiah Hunter MRN: [id number] DOB: 1960/03/27  Jeremiah Hunter is a 62 y.o. year old male who sees [name], MD for primary care. I spoke with  [name] by phone today.  What matters to the patients health and wellness today?  Obtain ACTT services    Goals Addressed             This Visit's Progress    Obtain ACTT Services   On track    Care Coordination Interventions: Solution-Focused Strategies employed:  Active listening / Reflection utilized  Emotional Support Provided Verbalization of feelings encouraged  LCSW introduced self and explained role Patient reports that he is still participating in ACTT through [employer]. Endorsed frustration that they did not complete SNAP re-certification ppwk. He is scheduled to visit DSS office today to rectify ACTT assists with support services (transportation, advocacy, resources) LCSW spoke with Agricultural consultant, assigned to patient, who confirmed she is assisting with transportation and advocating for pt at DSS Pt is awaiting to speak with Psychiatrist about medications. States he is compliant with one med and will inform team that he is not taking the other two meds LCSW encouraged patient to utilize healthy coping skills to regulate strong emotions         SDOH assessments and interventions completed:  No     Care Coordination Interventions:  Yes, provided   Follow up plan: Follow up call scheduled for 1-2 weeks    Encounter Outcome:  Pt. Visit Completed   Christa See, MSW, [email or web address] Phone [phone] 10:20 AM

## 2022-02-07 ENCOUNTER — Ambulatory Visit: Payer: Self-pay | Admitting: Licensed Clinical Social Worker

## 2022-02-08 ENCOUNTER — Ambulatory Visit: Payer: Self-pay | Admitting: *Deleted

## 2022-02-08 NOTE — Telephone Encounter (Signed)
Reason for Disposition  Scalp swelling, bruise or pain  Answer Assessment - Initial Assessment Questions 1. MECHANISM: "How did the injury happen?" For falls, ask: "What height did you fall from?" and "What surface did you fall against?"      Sitting in chair, fell out of chair hit the wall 2. ONSET: "When did the injury happen?" (Minutes or hours ago)      Last night 3. NEUROLOGIC SYMPTOMS: "Was there any loss of consciousness?" "Are there any other neurological symptoms?"      no 4. MENTAL STATUS: "Does the person know who they are, who you are, and where they are?"      No confusion 5. LOCATION: "What part of the head was hit?"      Above R eye, knot present 6. SCALP APPEARANCE: "What does the scalp look like? Is it bleeding now?" If Yes, ask: "Is it difficult to stop?"      no 7. SIZE: For cuts, bruises, or swelling, ask: "How large is it?" (e.g., inches or centimeters)      Knot- grape 8. PAIN: "Is there any pain?" If Yes, ask: "How bad is it?"  (e.g., Scale 1-10; or mild, moderate, severe)     Sore to touch-mild 9. TETANUS: For any breaks in the skin, ask: "When was the last tetanus booster?"     na 10. OTHER SYMPTOMS: "Do you have any other symptoms?" (e.g., neck pain, vomiting)       no  Protocols used: Head Injury-A-AH

## 2022-02-08 NOTE — Telephone Encounter (Signed)
  Chief Complaint: fell out of chair- hit head on wall Symptoms: small knot on head Frequency: last night Pertinent Negatives: Patient denies neurological symptoms Disposition: [] ED /[] Urgent Care (no appt availability in office) / [] Appointment(In office/virtual)/ []  [place] Virtual Care/ [x] Home Care/ [] Refused Recommended Disposition /[] [place] [facility]/ []  Follow-up with PCP Additional Notes: Will follow home care instructions and patient will call back if he has any changes/additional symptoms.

## 2022-02-09 NOTE — Patient Outreach (Signed)
  Care Coordination Late Entry  Follow Up Visit Note   Outreach completed 02/07/22 Name: Jeremiah Hunter MRN: [id number] DOB: 1960-10-20  Jeremiah Hunter is a 62 y.o. year old male who sees [name], MD for primary care. I spoke with  [name] by phone today.  What matters to the patients health and wellness today?  Stress Management    Goals Addressed             This Visit's Progress    COMPLETED: Obtain ACTT Services   On track    Care Coordination Interventions: Solution-Focused Strategies employed:  Active listening / Reflection utilized  Emotional Support Provided Verbalization of feelings encouraged  Patient reports he is doing well Endorses a decrease in stress after submitting ppwk for SNAP benefits re-certification  Patient participates in group therapy on Wednesdays. He continues to wait to meet with Psychiatrist, to address medications Patient has family locally and visits family in [place] No identified mental health and/or resource needs Patient agreed to contact PCP office and/or LCSW if any needs arise           SDOH assessments and interventions completed:  No     Care Coordination Interventions:  Yes, provided   Follow up plan: No further intervention required.   Encounter Outcome:  Pt. Visit Completed   Christa See, MSW, [email or web address] Phone [phone] 6:21 PM

## 2022-02-09 NOTE — Patient Instructions (Signed)
Visit Information  Thank you for taking time to visit with me today. Please don't hesitate to contact me if I can be of assistance to you.   Following are the goals we discussed today:   Goals Addressed             This Visit's Progress    COMPLETED: Obtain ACTT Services   On track    Care Coordination Interventions: Solution-Focused Strategies employed:  Active listening / Reflection utilized  Emotional Support Provided Verbalization of feelings encouraged  Patient reports he is doing well Endorses a decrease in stress after submitting ppwk for SNAP benefits re-certification  Patient participates in group therapy on Wednesdays. He continues to wait to meet with Psychiatrist, to address medications Patient has family locally and visits family in [place] No identified mental health and/or resource needs Patient agreed to contact PCP office and/or LCSW if any needs arise           If you are experiencing a Mental Health or [place] or need someone to talk to, please call the Suicide and Crisis Lifeline: 988 call 911   Patient verbalizes understanding of instructions and care plan provided today and agrees to view in [place]. Active MyChart status and patient understanding of how to access instructions and care plan via MyChart confirmed with patient.     No further follow up required:    Christa See, MSW, [email or web address] Phone [phone] 6:21 PM

## 2022-02-12 ENCOUNTER — Ambulatory Visit: Payer: Self-pay

## 2022-02-12 NOTE — Telephone Encounter (Signed)
  Chief Complaint: f/u information update on how head feels Symptoms: tender spot only Frequency: since Friday Pertinent Negatives: Patient denies vision changes, dizziness, headache, weakness Disposition: [] ED /[] Urgent Care (no appt availability in office) / [] Appointment(In office/virtual)/ []  [place] Virtual Care/ [] Home Care/ [] Refused Recommended Disposition /[] [place] [facility]/ [x]  Follow-up with PCP Additional Notes: please send to PCP Stated that feels sore -no bump barely there. Knot is gone. States still hurts like the same-no headaches, no vision changes.  Reason for Disposition  [1] Follow-up call to recent contact AND [2] information only call, no triage required  Answer Assessment - Initial Assessment Questions 1. REASON FOR CALL or QUESTION: "What is your reason for calling today?" or "How can I best help you?" or "What question do you have that I can help answer?"     Wanted to update Jane and PCP on how head is feeling today  Protocols used: Information Only Call - No Triage-A-AH

## 2022-02-15 ENCOUNTER — Encounter: Payer: Self-pay | Admitting: Critical Care Medicine

## 2022-02-16 ENCOUNTER — Ambulatory Visit: Payer: Self-pay

## 2022-02-16 NOTE — Telephone Encounter (Signed)
  Chief Complaint: Left arm pain, and lump Symptoms: above Frequency: this morning Pertinent Negatives: Patient denies Fever Disposition: [] ED /[x] Urgent Care (no appt availability in office) / [] Appointment(In office/virtual)/ []  [place] Virtual Care/ [] Home Care/ [] Refused Recommended Disposition /[] [place] [facility]/ []  Follow-up with PCP Additional Notes: Pt states that today he noticed a small bruise on his arm and his bicep hurts. Additionally pt states there is  a lump just above where his blood was drawn last Thursday.   Reason for Disposition  Localized pain, redness or hard lump along vein  Answer Assessment - Initial Assessment Questions 1. ONSET: "When did the pain start?"     This morning 2. LOCATION: "Where is the pain located?"     Bicep -  and also on upper arm where blood is taken.  3. PAIN: "How bad is the pain?" (Scale 1-10; or mild, moderate, severe)   - MILD (1-3): Doesn't interfere with normal activities.   - MODERATE (4-7): Interferes with normal activities (e.g., work or school) or awakens from sleep.   - SEVERE (8-10): Excruciating pain, unable to do any normal activities, unable to hold a cup of water.     4/10 4. WORK OR EXERCISE: "Has there been any recent work or exercise that involved this part of the body?"     no 5. CAUSE: "What do you think is causing the arm pain?"     Unsure 6. OTHER SYMPTOMS: "Do you have any other symptoms?" (e.g., neck pain, swelling, rash, fever, numbness, weakness)     Small bruise 7. PREGNANCY: "Is there any chance you are pregnant?" "When was your last menstrual period?"     na  Protocols used: Arm Pain-A-AH

## 2022-02-17 ENCOUNTER — Encounter (HOSPITAL_COMMUNITY): Payer: Self-pay

## 2022-02-17 ENCOUNTER — Ambulatory Visit (HOSPITAL_COMMUNITY)
Admission: EM | Admit: 2022-02-17 | Discharge: 2022-02-17 | Disposition: A | Discharge status: Home / Self Care | Payer: Medicaid Other | Attending: Emergency Medicine | Admitting: Emergency Medicine

## 2022-02-17 DIAGNOSIS — R21 Rash and other nonspecific skin eruption: Secondary | ICD-10-CM

## 2022-02-17 DIAGNOSIS — R82998 Other abnormal findings in urine: Secondary | ICD-10-CM

## 2022-02-17 LAB — POCT URINALYSIS DIPSTICK, ED / UC
Bilirubin Urine: NEGATIVE
Glucose, UA: NEGATIVE mg/dL
Ketones, ur: NEGATIVE mg/dL
Leukocytes,Ua: NEGATIVE
Nitrite: NEGATIVE
Protein, ur: NEGATIVE mg/dL
Specific Gravity, Urine: 1.01 (ref 1.005–1.030)
Urobilinogen, UA: 0.2 mg/dL (ref 0.0–1.0)
pH: 6 (ref 5.0–8.0)

## 2022-02-17 MED ORDER — TRIAMCINOLONE ACETONIDE 0.1 % EX CREA: 1.0000 | TOPICAL_CREAM | Freq: Two times a day (BID) | CUTANEOUS | 0 refills | Status: DC

## 2022-02-17 NOTE — ED Provider Notes (Signed)
[facility]    CSN: [id number] Arrival date & time: 02/17/22  1002      History   Chief Complaint Chief Complaint  Patient presents with   Rash   blood work    HPI Jeremiah Hunter is a 61 y.o. male.  Patient presents complaining of a rash on left arm that started yesterday.  He reports redness to the site and states that he thinks it is spreading on his arm.  Patient denies any change to hygienic products or any contact with anything that could have triggered the rash.  Patient denies any pain or itchiness.  Patient denies any history of previous rash.  Patient denies any past history of skin infection or allergic reaction.  Patient reports that his PCP told him to come to urgent care to have the rash evaluated.  He denies any throat discomfort, shortness of breath, or any difficulty swallowing.  Patient reports that he wants his urine checked.  He reports that he has noticed his urine has been very clear and bright.  He reports having a history of a urinary tract infection.  He denies any dysuria, urinary frequency, abdominal pain, flank pain, back pain, scrotal pain, penile discharge, STD exposure, fever, chills, rectal pain, nausea, vomiting, or any systemic symptoms. He reports that he has been drinking a lot of water.  He reports that he has a PCP appointment scheduled for early February.    Rash   Past Medical History:  Diagnosis Date   Acute bilateral low back pain without sciatica 07/30/2019   Allergy    on meds   Anxiety    on meds   DDD (degenerative disc disease), lumbar    on meds   Enlarged prostate    GERD (gastroesophageal reflux disease)    on meds   Hypertension    on meds   Severe episode of recurrent major depressive disorder, with psychotic features (HCC) 03/24/2019   on meds   Sleep apnea    uses CPAP    Patient Active Problem List   Diagnosis Date Noted   Schizoaffective disorder, bipolar type (HCC) 01/03/2022   Involuntary commitment  01/03/2022   Myelomalacia of cervical cord (HCC) 12/06/2021   Other spondylosis with myelopathy, cervical region 10/10/2021   Cervical spine disease 10/03/2021   Urinary retention 09/04/2021   Leg cramps 06/29/2021   Aphthous ulcer 06/21/2021   Vitamin D deficiency 06/13/2021   Pain due to onychomycosis of toenails of both feet 03/13/2021   Itching 01/26/2021   Frequent falls 12/08/2020   Lumbar foraminal stenosis 12/07/2020   DDD (degenerative disc disease), lumbar    Prolapsed internal hemorrhoids, grade 3 09/25/2019   Meralgia paresthetica of left side 09/11/2019   Bipolar affective disorder, currently depressed, moderate (HCC) 09/11/2019   External hemorrhoids with complication 07/30/2019   Overactive bladder 06/11/2019   OSA (obstructive sleep apnea) 03/24/2019   Essential hypertension 03/24/2019   Seizure disorder (HCC) 03/24/2019    Past Surgical History:  Procedure Laterality Date   CYSTECTOMY     DENTAL SURGERY     teeth pulled   HERNIA REPAIR         Home Medications    Prior to Admission medications   Medication Sig Start Date End Date Taking? Authorizing Provider  amLODipine (NORVASC) 5 MG tablet TAKE 1 TABLET(5 MG) BY MOUTH DAILY Patient taking differently: Take 5 mg by mouth daily. TAKE 1 TABLET(5 MG) BY MOUTH DAILY 12/26/21  Yes Storm Frisk, MD  divalproex (DEPAKOTE ER) 500 MG 24 hr tablet Take 1 tablet (500 mg total) by mouth in the morning and at bedtime. 06/13/21  Yes [name], MD  fluticasone (FLONASE) 50 MCG/ACT nasal spray Place 2 sprays into both nostrils daily. 09/26/21  Yes [name], MD  gabapentin (NEURONTIN) 300 MG capsule Take 2 capsules (600 mg total) by mouth 3 (three) times daily. 12/26/21  Yes [name], MD  OXYTROL 3.9 MG/24HR Apply patch two times a week 12/26/21  Yes [name], MD  triamcinolone cream (KENALOG) 0.1 % Apply 1 Application topically 2 (two) times daily. 02/17/22  Yes Flossie Dibble, NP   Vitamin D, Ergocalciferol, (DRISDOL) 1.25 MG (50000 UNIT) CAPS capsule Take 1 capsule (50,000 Units total) by mouth every 7 (seven) days. Patient taking differently: Take 50,000 Units by mouth every Sunday. 12/26/21  Yes [name], MD  chlorhexidine (PERIDEX) 0.12 % solution Use as directed 15 mLs in the mouth or throat 2 (two) times daily. 12/08/21   Rondel Oh, MD    Family History Family History  Family history unknown: Yes    Social History Social History   Tobacco Use   Smoking status: Never   Smokeless tobacco: Never  Vaping Use   Vaping Use: Never used  Substance Use Topics   Alcohol use: Never   Drug use: Never     Allergies   Patient has no known allergies.   Review of Systems Review of Systems  Skin:  Positive for rash.   Per HPI  Physical Exam Triage Vital Signs ED Triage Vitals  Enc Vitals Group     BP 02/17/22 1020 137/89     Pulse Rate 02/17/22 1020 96     Resp 02/17/22 1020 18     Temp 02/17/22 1020 98.4 F (36.9 C)     Temp Source 02/17/22 1020 Oral     SpO2 02/17/22 1020 98 %     Weight --      Height --      Head Circumference --      Peak Flow --      Pain Score 02/17/22 1021 0     Pain Loc --      Pain Edu? --      Excl. in [place]? --    No data found.  Updated Vital Signs BP 137/89 (BP Location: Left Arm)   Pulse 96   Temp 98.4 F (36.9 C) (Oral)   Resp 18   SpO2 98%     Physical Exam Vitals and nursing note reviewed.  Constitutional:      Appearance: Normal appearance.  Abdominal:     General: Abdomen is flat. Bowel sounds are normal. There is no distension. There are no signs of injury.     Palpations: Abdomen is soft. There is no shifting dullness, fluid wave, hepatomegaly, splenomegaly, mass or pulsatile mass.     Tenderness: There is no abdominal tenderness. There is no right CVA tenderness, left CVA tenderness, guarding or rebound. Negative signs include Murphy's sign, Rovsing's sign and McBurney's sign.   Skin:    Findings: Erythema and rash present.     Comments: Localized erythremic non-raised area on left upper arm. Tenderness upon palpation.   Neurological:     Mental Status: He is alert.      UC Treatments / Results  Labs (all labs ordered are listed, but only abnormal results are displayed) Labs Reviewed  POCT URINALYSIS DIPSTICK, ED / UC  EKG   Radiology No results found.  Procedures Procedures (including critical care time)  Medications Ordered in UC Medications - No data to display  Initial Impression / Assessment and Plan / UC Course  I have reviewed the triage vital signs and the nursing notes.  Pertinent labs & imaging results that were available during my care of the patient were reviewed by me and considered in my medical decision making (see chart for details).     Patient was evaluated for rash.  Uncertain etiology of rash, possibility of contact dermatitis based on assessment.  Triamcinolone cream was sent to the pharmacy, patient was made aware of treatment regiment.  Patient was educated that if symptoms or not improving he will need to follow-up with his primary care doctor or at this office.    Patient was evaluated for urine color.  Urinalysis showed small blood, otherwise unremarkable.  Low suspicion for urinary tract infection or nephrolithiasis, blood may be physiologic.  Patient was made aware to continue adequate hydration and follow-up if any additional urinary symptoms occur.  Patient verbalized understanding of instructions.  Charting was provided using a a verbal dictation system, charting was proofread for errors, errors may occur which could change the meaning of the information charted.   Final Clinical Impressions(s) / UC Diagnoses   Final diagnoses:  Rash  Urine color appears bright     Discharge Instructions      Triamcinolone cream was sent to the pharmacy, you may use this 2 times a day on the affected area, please do not  use this medication for longer than 4 weeks.  Please follow up with your primary care doctor or at this office if your symptoms are not improving.      ED Prescriptions     Medication Sig Dispense Auth. Provider   triamcinolone cream (KENALOG) 0.1 % Apply 1 Application topically 2 (two) times daily. 30 g Flossie Dibble, NP      PDMP not reviewed this encounter.   Flossie Dibble, NP 02/17/22 1409

## 2022-02-17 NOTE — ED Triage Notes (Signed)
Pt reports a red rash on his left forearm x 1 day.  Pt is requesting blood work today.

## 2022-02-17 NOTE — Discharge Instructions (Signed)
Triamcinolone cream was sent to the pharmacy, you may use this 2 times a day on the affected area, please do not use this medication for longer than 4 weeks.  Please follow up with your primary care doctor or at this office if your symptoms are not improving.

## 2022-02-18 ENCOUNTER — Encounter: Payer: Self-pay | Admitting: Critical Care Medicine

## 2022-02-19 ENCOUNTER — Telehealth: Payer: Self-pay | Admitting: Critical Care Medicine

## 2022-02-19 NOTE — Telephone Encounter (Signed)
I have received several confusing mychart msgs from this patient, pls call him to find out what he is needing

## 2022-02-22 ENCOUNTER — Ambulatory Visit: Payer: Medicaid Other | Admitting: Physician Assistant

## 2022-02-23 NOTE — Telephone Encounter (Signed)
Called patient and he stated the problems below :  -question regarding his Hemorid -having right arm problem  -speech therapy - 'Funny"  feeling In head  - can he drink tomato juice ?  Patient has appointment on 03/01/22

## 2022-02-23 NOTE — Telephone Encounter (Signed)
Keep feb 8 appt. Can drink juice

## 2022-02-25 ENCOUNTER — Emergency Department (HOSPITAL_COMMUNITY)
Admission: EM | Admit: 2022-02-25 | Discharge: 2022-02-25 | Disposition: A | Discharge status: Other Acute Inpatient Hospital | Payer: Medicaid Other | Attending: Emergency Medicine | Admitting: Emergency Medicine

## 2022-02-25 ENCOUNTER — Ambulatory Visit (HOSPITAL_COMMUNITY)
Admission: EM | Admit: 2022-02-25 | Discharge: 2022-02-25 | Disposition: A | Discharge status: Home / Self Care | Payer: Medicaid Other | Attending: Psychiatry | Admitting: Psychiatry

## 2022-02-25 ENCOUNTER — Encounter: Payer: Self-pay | Admitting: Critical Care Medicine

## 2022-02-25 ENCOUNTER — Encounter (HOSPITAL_COMMUNITY): Payer: Self-pay

## 2022-02-25 ENCOUNTER — Other Ambulatory Visit: Payer: Self-pay

## 2022-02-25 DIAGNOSIS — Z79899 Other long term (current) drug therapy: Secondary | ICD-10-CM | POA: Insufficient documentation

## 2022-02-25 DIAGNOSIS — I10 Essential (primary) hypertension: Secondary | ICD-10-CM | POA: Diagnosis not present

## 2022-02-25 DIAGNOSIS — F332 Major depressive disorder, recurrent severe without psychotic features: Secondary | ICD-10-CM | POA: Insufficient documentation

## 2022-02-25 DIAGNOSIS — F4321 Adjustment disorder with depressed mood: Secondary | ICD-10-CM

## 2022-02-25 NOTE — Discharge Instructions (Signed)
Safety Plan Jeremiah Hunter will  reach out to [facility] call 911 or call mobile crisis, or go to nearest emergency room if condition worsens or if suicidal thoughts become active Patients' will follow up with ACT team for outpatient psychiatric services (therapy/medication management).  The suicide prevention education provided includes the following: Suicide risk factors Suicide prevention and interventions National Suicide Hotline telephone number [facility] assessment telephone number [facility] Emergency Assistance [place] and/or Residential Mobile Crisis Unit telephone number Request made of family/significant other to:   Remove weapons (e.g., guns, rifles, knives), all items previously/currently identified as safety concern.   Remove drugs/medications (over the counter, prescriptions, illicit drugs), all items previously/currently identified as a safety concern.

## 2022-02-25 NOTE — ED Notes (Signed)
Safe transport scheduled to pick pt up
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
## 2022-02-25 NOTE — ED Notes (Signed)
Spoke with Yehuda Budd at [facility] to give report on pt.
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
## 2022-02-26 NOTE — Telephone Encounter (Signed)
Called patient and he is aware. 

## 2022-03-01 ENCOUNTER — Ambulatory Visit: Payer: Medicaid Other | Attending: Critical Care Medicine | Admitting: Critical Care Medicine

## 2022-03-01 ENCOUNTER — Encounter: Payer: Self-pay | Admitting: Critical Care Medicine

## 2022-03-01 VITALS — BP 121/78 | HR 84 | Wt 207.4 lb

## 2022-03-01 DIAGNOSIS — Z760 Encounter for issue of repeat prescription: Secondary | ICD-10-CM | POA: Insufficient documentation

## 2022-03-01 DIAGNOSIS — I10 Essential (primary) hypertension: Secondary | ICD-10-CM | POA: Insufficient documentation

## 2022-03-01 DIAGNOSIS — G4730 Sleep apnea, unspecified: Secondary | ICD-10-CM | POA: Diagnosis not present

## 2022-03-01 DIAGNOSIS — F8081 Childhood onset fluency disorder: Secondary | ICD-10-CM | POA: Insufficient documentation

## 2022-03-01 DIAGNOSIS — G40909 Epilepsy, unspecified, not intractable, without status epilepticus: Secondary | ICD-10-CM

## 2022-03-01 DIAGNOSIS — G5712 Meralgia paresthetica, left lower limb: Secondary | ICD-10-CM

## 2022-03-01 DIAGNOSIS — F333 Major depressive disorder, recurrent, severe with psychotic symptoms: Secondary | ICD-10-CM | POA: Insufficient documentation

## 2022-03-01 DIAGNOSIS — M79675 Pain in left toe(s): Secondary | ICD-10-CM

## 2022-03-01 DIAGNOSIS — Z139 Encounter for screening, unspecified: Secondary | ICD-10-CM | POA: Diagnosis not present

## 2022-03-01 DIAGNOSIS — F250 Schizoaffective disorder, bipolar type: Secondary | ICD-10-CM | POA: Diagnosis not present

## 2022-03-01 DIAGNOSIS — R252 Cramp and spasm: Secondary | ICD-10-CM | POA: Insufficient documentation

## 2022-03-01 DIAGNOSIS — M79674 Pain in right toe(s): Secondary | ICD-10-CM

## 2022-03-01 DIAGNOSIS — Z79899 Other long term (current) drug therapy: Secondary | ICD-10-CM | POA: Diagnosis not present

## 2022-03-01 DIAGNOSIS — B351 Tinea unguium: Secondary | ICD-10-CM

## 2022-03-01 DIAGNOSIS — G9589 Other specified diseases of spinal cord: Secondary | ICD-10-CM

## 2022-03-01 MED ORDER — OXYTROL 3.9 MG/24HR TD PTTW: MEDICATED_PATCH | TRANSDERMAL | 2 refills | Status: DC

## 2022-03-01 MED ORDER — AMLODIPINE BESYLATE 5 MG PO TABS: 5.0000 mg | ORAL_TABLET | Freq: Every day | ORAL | 1 refills | Status: DC

## 2022-03-01 MED ORDER — FLUTICASONE PROPIONATE 50 MCG/ACT NA SUSP: 2.0000 | Freq: Every day | NASAL | 6 refills | Status: DC

## 2022-03-01 MED ORDER — VITAMIN D (ERGOCALCIFEROL) 1.25 MG (50000 UNIT) PO CAPS: 50000.0000 [IU] | ORAL_CAPSULE | ORAL | 2 refills | Status: DC

## 2022-03-01 MED ORDER — DIVALPROEX SODIUM ER 500 MG PO TB24: 500.0000 mg | ORAL_TABLET | Freq: Two times a day (BID) | ORAL | 4 refills | Status: DC

## 2022-03-01 MED ORDER — GABAPENTIN 300 MG PO CAPS: 600.0000 mg | ORAL_CAPSULE | Freq: Three times a day (TID) | ORAL | 3 refills | Status: DC

## 2022-03-01 NOTE — Assessment & Plan Note (Signed)
Continue Depakote. 
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
## 2022-03-01 NOTE — Assessment & Plan Note (Signed)
Hypertension stable at this time we will continue with current medication

## 2022-03-01 NOTE — Patient Instructions (Signed)
Follow lifestyle management per handout No medication changes, refills sent to pharmacy Use Biotene mouthwash this is over the counter Follow up with ACT team Follow up with gastroenterology for colon cancer screen and see dentist Complete screening labs will be performed Speech therapy will be ordered Return Dr Joya Gaskins 5 months

## 2022-03-01 NOTE — Assessment & Plan Note (Signed)
Care per mental health

## 2022-03-01 NOTE — Assessment & Plan Note (Signed)
Continues with numbness and tingling on the right leg will monitor

## 2022-03-01 NOTE — Assessment & Plan Note (Signed)
Referral to speech therapy will be made

## 2022-03-01 NOTE — Assessment & Plan Note (Signed)
Patient has had foot care

## 2022-03-02 ENCOUNTER — Telehealth: Payer: Self-pay

## 2022-03-02 LAB — COMPREHENSIVE METABOLIC PANEL
ALT: 14 IU/L (ref 0–44)
AST: 20 IU/L (ref 0–40)
Albumin/Globulin Ratio: 1.7 (ref 1.2–2.2)
Albumin: 4.4 g/dL (ref 3.8–4.9)
Alkaline Phosphatase: 118 IU/L (ref 44–121)
BUN/Creatinine Ratio: 15 (ref 9–20)
BUN: 18 mg/dL (ref 6–24)
Bilirubin Total: 0.5 mg/dL (ref 0.0–1.2)
CO2: 23 mmol/L (ref 20–29)
Calcium: 9.4 mg/dL (ref 8.7–10.2)
Chloride: 102 mmol/L (ref 96–106)
Creatinine, Ser: 1.18 mg/dL (ref 0.76–1.27)
Globulin, Total: 2.6 g/dL (ref 1.5–4.5)
Glucose: 84 mg/dL (ref 70–99)
Potassium: 4.2 mmol/L (ref 3.5–5.2)
Sodium: 139 mmol/L (ref 134–144)
Total Protein: 7 g/dL (ref 6.0–8.5)
eGFR: 71 mL/min/{1.73_m2} (ref >59)

## 2022-03-02 LAB — CBC WITH DIFFERENTIAL/PLATELET
Basophils Absolute: 0 10*3/uL (ref 0.0–0.2)
Basos: 1 %
EOS (ABSOLUTE): 0.1 10*3/uL (ref 0.0–0.4)
Eos: 1 %
Hematocrit: 40.7 % (ref 37.5–51.0)
Hemoglobin: 13.5 g/dL (ref 13.0–17.7)
Immature Grans (Abs): 0 10*3/uL (ref 0.0–0.1)
Immature Granulocytes: 0 %
Lymphocytes Absolute: 2.4 10*3/uL (ref 0.7–3.1)
Lymphs: 54 %
MCH: 27.7 pg (ref 26.6–33.0)
MCHC: 33.2 g/dL (ref 31.5–35.7)
MCV: 84 fL (ref 79–97)
Monocytes Absolute: 0.4 10*3/uL (ref 0.1–0.9)
Monocytes: 8 %
Neutrophils Absolute: 1.7 10*3/uL (ref 1.4–7.0)
Neutrophils: 36 %
Platelets: 262 10*3/uL (ref 150–450)
RBC: 4.87 x10E6/uL (ref 4.14–5.80)
RDW: 14.7 % (ref 11.6–15.4)
WBC: 4.5 10*3/uL (ref 3.4–10.8)

## 2022-03-02 LAB — LIPID PANEL
Chol/HDL Ratio: 2.3 ratio (ref 0.0–5.0)
Cholesterol, Total: 162 mg/dL (ref 100–199)
HDL: 71 mg/dL (ref >39)
LDL Chol Calc (NIH): 79 mg/dL (ref 0–99)
Triglycerides: 59 mg/dL (ref 0–149)
VLDL Cholesterol Cal: 12 mg/dL (ref 5–40)

## 2022-03-02 NOTE — Telephone Encounter (Signed)
-----   Message from [name], MD sent at 03/02/2022  6:09 AM EST ----- Let pt know all labs are normal  no medication changes

## 2022-03-02 NOTE — Telephone Encounter (Signed)
Pt was called and is aware of results, DOB was confirmed.  ?

## 2022-03-02 NOTE — Progress Notes (Signed)
Let pt know all labs are normal  no medication changes

## 2022-03-05 ENCOUNTER — Telehealth: Payer: Self-pay | Admitting: Critical Care Medicine

## 2022-03-05 DIAGNOSIS — Z1211 Encounter for screening for malignant neoplasm of colon: Secondary | ICD-10-CM

## 2022-03-05 NOTE — Telephone Encounter (Signed)
Referral for colonoscopy was sent

## 2022-03-05 NOTE — Telephone Encounter (Signed)
Pt is calling to request a referral to [facility] for a colonoscopy. Please advise Fax- [phone]

## 2022-03-06 ENCOUNTER — Encounter: Payer: Self-pay | Admitting: Gastroenterology

## 2022-03-08 ENCOUNTER — Ambulatory Visit: Payer: Medicaid Other | Attending: Critical Care Medicine | Admitting: Speech Pathology

## 2022-03-08 ENCOUNTER — Encounter: Payer: Self-pay | Admitting: Speech Pathology

## 2022-03-08 DIAGNOSIS — F8081 Childhood onset fluency disorder: Secondary | ICD-10-CM | POA: Insufficient documentation

## 2022-03-08 NOTE — Therapy (Signed)
OUTPATIENT SPEECH LANGUAGE PATHOLOGY EVALUATION   Patient Name: Jeremiah Hunter MRN: [id number] DOB:[birth year], 62 y.o., male Today's Date: 03/08/2022  PCP: [name], MD REFERRING PROVIDER: [name], MD  END OF SESSION:  End of Session - 03/08/22 1329     Visit Number 1    Number of Visits 1    Authorization Type Medicaid    SLP Start Time 1320    SLP Stop Time  1405    SLP Time Calculation (min) 45 min    Activity Tolerance Patient tolerated treatment well             Past Medical History:  Diagnosis Date   Acute bilateral low back pain without sciatica 07/30/2019   Allergy    on meds   Anxiety    on meds   DDD (degenerative disc disease), lumbar    on meds   Enlarged prostate    GERD (gastroesophageal reflux disease)    on meds   Hypertension    on meds   Severe episode of recurrent major depressive disorder, with psychotic features ([place]) 03/24/2019   on meds   Sleep apnea    uses CPAP   Past Surgical History:  Procedure Laterality Date   CYSTECTOMY     DENTAL SURGERY     teeth pulled   HERNIA REPAIR     Patient Active Problem List   Diagnosis Date Noted   Stuttering 03/01/2022   Schizoaffective disorder, bipolar type ([place]) 01/03/2022   Involuntary commitment 01/03/2022   Myelomalacia of cervical cord ([place]) 12/06/2021   Other spondylosis with myelopathy, cervical region 10/10/2021   Cervical spine disease 10/03/2021   Urinary retention 09/04/2021   Leg cramps 06/29/2021   Aphthous ulcer 06/21/2021   Vitamin D deficiency 06/13/2021   Pain due to onychomycosis of toenails of both feet 03/13/2021   Itching 01/26/2021   Frequent falls 12/08/2020   Lumbar foraminal stenosis 12/07/2020   DDD (degenerative disc disease), lumbar    Prolapsed internal hemorrhoids, grade 3 09/25/2019   Meralgia paresthetica of left side 09/11/2019   Bipolar affective disorder, currently depressed, moderate ([place]) 09/11/2019   External hemorrhoids with  complication [id number]   Overactive bladder 06/11/2019   OSA (obstructive sleep apnea) 03/24/2019   Essential hypertension 03/24/2019   Seizure disorder ([place]) 03/24/2019    ONSET DATE: 03-08-22  REFERRING DIAG: F80.81 (ICD-10-CM) - Stuttering   THERAPY DIAG:  Stuttering  Rationale for Evaluation and Treatment: Rehabilitation  SUBJECTIVE:   SUBJECTIVE STATEMENT: "I'm thankful for being here" Pt accompanied by: self  PERTINENT HISTORY: Pt has stuttered since childhood (developmental). Reports that he stutters when he becomes nervous.   PAIN:  Are you having pain? Yes 8/10 Location: Legs, back, right side  FALLS: Has patient fallen in last 6 months?  No  LIVING ENVIRONMENT: Lives with: lives alone  PLOF:  Level of assistance: Independent with ADLs, Independent with IADLs Employment: On disability  PATIENT GOALS: "To be able to go to church and sing a song"  OBJECTIVE:   DIAGNOSTIC FINDINGS: n/a  COGNITION: Overall cognitive status: Within functional limits for tasks assessed and No family/caregiver present to determine baseline cognitive functioning  MOTOR SPEECH: assessed across variety of speech tasks: reading, word repetition, generative discourse sample Overall motor speech: Appears intact Voice Quality: normal Word and Phrasal Stress: WFL Resonance: WFL Articulation: Imprecise\ Intelligibility: Intelligibility reduced Motor planning: Appears intact Interfering components: Premorbid status Effective technique: Repetition   FLUENCY: Overall fluency rating (reading passage): 85% (Due  to pt's reading level, not stuttering disfluencies) Stuttering behaviors: None observed  Secondary behaviors: None observed Physical tension: None observed Beliefs and Attitudes toward stuttering: Reports he stutters when he is nervous Impact on participation: None reported Comments: Pt reports taking to relatives on phone and in person, communicating in small group  discussions     ORAL MOTOR EXAMINATION: Overall status: Did not assess  TODAY'S TREATMENT:                                                                                                                                         DATE:   03-08-22: SLP educated on strategies to maximize communication efficacy including attempting to remain calm and ending conversation when nervousness is high. Education on eval results and recs. Pt verbalized understanding.    PATIENT EDUCATION: Education details: See above Person educated: Patient Education method: Customer service manager Education comprehension: verbalized understanding   ASSESSMENT:  CLINICAL IMPRESSION: Patient is a 62 y.o. male who was seen today for stuttering eval. Pt reports has been stuttering since childhood. Tells SLP "I think it's because I'm nervous". SLP led pt through reading and discourse tasks with no stuttering disfluencies exhibited. Communication impacted primarily by emotionality of pt's message (pt chose to talk about past experiences). Intelligibility mildly reduced, but appears consistent with baseline per pt report. SLPs provides on education on emotion's impact on communication and models strategies for maintaining calm while communicating. Educated on recs to discuss with mental health practitioner strategies to manage stress and anxiety. Pt verbalized understanding. No stuttering difluencies or secondary behaviors exhibited, therefore speech therapy is not indicated.   PLAN:  SLP FREQUENCY: one time visit  SLP DURATION: other: eval and d/c    Leroy Libman, [place] 03/08/2022, 2:23 PM

## 2022-03-09 NOTE — Telephone Encounter (Signed)
Called patient and he is aware. 

## 2022-03-13 ENCOUNTER — Encounter: Payer: Self-pay | Admitting: Gastroenterology

## 2022-03-19 ENCOUNTER — Telehealth: Payer: Self-pay | Admitting: Emergency Medicine

## 2022-03-19 DIAGNOSIS — N3281 Overactive bladder: Secondary | ICD-10-CM

## 2022-03-19 DIAGNOSIS — R339 Retention of urine, unspecified: Secondary | ICD-10-CM

## 2022-03-19 NOTE — Addendum Note (Signed)
Addended by: [name] on: 03/19/2022 05:02 PM   Modules accepted: Orders

## 2022-03-19 NOTE — Telephone Encounter (Signed)
Copied from [place] [id number]. Topic: General - Inquiry >> Mar 19, 2022  8:21 AM Jeremiah Hunter wrote: Reason for CRM: pt called needing an order for large Depends  He says he thinks the company is [employer].  He said they will probably send an order also.   The medium is too tight.  CB#   [phone]

## 2022-03-20 ENCOUNTER — Ambulatory Visit: Payer: Medicaid Other | Admitting: Internal Medicine

## 2022-03-20 NOTE — Telephone Encounter (Signed)
I spoke to the patient and he confirmed that his incontinence supplies come from [employer]. The order was then faxed to [facility]

## 2022-03-21 ENCOUNTER — Telehealth: Payer: Self-pay | Admitting: Critical Care Medicine

## 2022-03-21 NOTE — Telephone Encounter (Signed)
Patient is calling because he received his depends and they are too small. Patient says the medium size ones are too tight and he needs a large. Patient wants to know if Dr. Joya Gaskins can inform the company that he needs a large. Please follow up with patient.

## 2022-03-22 NOTE — Telephone Encounter (Signed)
I called the patient and explained to him that I sent the order to [employer] for the larger depends yesterday after I spoke to him. I asked if he received an order of smaller depends since then and he said no, he just wanted to make sure that [employer] receives the order for the larger depends.

## 2022-03-22 NOTE — Telephone Encounter (Signed)
Noted  

## 2022-03-22 NOTE — Telephone Encounter (Signed)
Jeremiah Hunter I already sent this order in for the patient

## 2022-03-22 NOTE — Telephone Encounter (Signed)
Patient is needing script for large size depends.

## 2022-03-23 ENCOUNTER — Telehealth: Payer: Self-pay | Admitting: Emergency Medicine

## 2022-03-23 DIAGNOSIS — H539 Unspecified visual disturbance: Secondary | ICD-10-CM

## 2022-03-23 NOTE — Telephone Encounter (Signed)
Copied from [place] [id number]. Topic: General - Other >> Mar 23, 2022  8:38 AM Oley Balm E wrote: Reason for CRM: Pt called reporting that his glasses do not fit his face well and he wants to have them adjusted. He says he was told to contact his pcp first

## 2022-03-25 ENCOUNTER — Encounter: Payer: Self-pay | Admitting: Critical Care Medicine

## 2022-03-26 ENCOUNTER — Telehealth: Payer: Self-pay

## 2022-03-26 NOTE — Telephone Encounter (Signed)
Called patient and he stated that he got his glasses done at [facility] and when asked would he like a referral he stated yes

## 2022-03-26 NOTE — Telephone Encounter (Signed)
While on call with patient the stated that the company ([employer]) has not received any paperwork for his large depends. I faxed paperwork on [id number] seen you have also faxed paperwork too.  I called [employer] and she stated that she got orders in the system

## 2022-03-26 NOTE — Telephone Encounter (Signed)
Patient calling again, states [employer] still hasn't received order for large depends

## 2022-03-26 NOTE — Telephone Encounter (Signed)
I spoke to the patient and he said that he just received a text message from [employer] informing him that they are processing the order for the large depends.

## 2022-03-26 NOTE — Telephone Encounter (Signed)
Ref sent

## 2022-03-26 NOTE — Addendum Note (Signed)
Addended by: Asencion Noble E on: 03/26/2022 03:40 PM   Modules accepted: Orders

## 2022-03-27 ENCOUNTER — Ambulatory Visit: Payer: Medicaid Other | Admitting: Critical Care Medicine

## 2022-03-27 NOTE — Telephone Encounter (Signed)
Noted thank you

## 2022-03-27 NOTE — Telephone Encounter (Signed)
Please see [facility]  note

## 2022-03-28 NOTE — Telephone Encounter (Signed)
Called patient an he is aware

## 2022-03-30 ENCOUNTER — Ambulatory Visit: Payer: Self-pay | Admitting: *Deleted

## 2022-03-30 ENCOUNTER — Encounter (HOSPITAL_COMMUNITY): Payer: Self-pay

## 2022-03-30 ENCOUNTER — Ambulatory Visit (HOSPITAL_COMMUNITY)
Admission: EM | Admit: 2022-03-30 | Discharge: 2022-03-30 | Disposition: A | Discharge status: Home / Self Care | Payer: Medicaid Other | Attending: Emergency Medicine | Admitting: Emergency Medicine

## 2022-03-30 DIAGNOSIS — M79604 Pain in right leg: Secondary | ICD-10-CM | POA: Diagnosis not present

## 2022-03-30 MED ORDER — CYCLOBENZAPRINE HCL 5 MG PO TABS: 5.0000 mg | ORAL_TABLET | Freq: Every evening | ORAL | 0 refills | Status: DC

## 2022-03-30 NOTE — ED Triage Notes (Signed)
Pt c/o rt posterior thigh pain x1 month and worse in past 2 days. Denies injury or taking meds.

## 2022-03-30 NOTE — Telephone Encounter (Signed)
  Chief Complaint: leg pain Symptoms: possible muscle strain- pain in R thigh Frequency: worse 2 days Pertinent Negatives: Patient denies chest pain, breathing difficulty, swelling, rash, fever, numbness, weakness Disposition: [] ED /[x] Urgent Care (no appt availability in office) / [] Appointment(In office/virtual)/ []  [facility]/ [] Home Care/ [] Refused Recommended Disposition /[] [facility]/ []  Follow-up with PCP Additional Notes: Advised UC for evaluation

## 2022-03-30 NOTE — Telephone Encounter (Signed)
Summary: Hamstring (Pulled)   Pt believes he pulled a hamstring in his right leg. Pt says he has been experiencing pain in the affected area. Pt would like to know if there is anything he can do at home.         Reason for Disposition  [1] SEVERE pain (e.g., excruciating, unable to do any normal activities) AND [2] not improved after 2 hours of pain medicine  Answer Assessment - Initial Assessment Questions 1. ONSET: "When did the pain start?"      Last couple days 2. LOCATION: "Where is the pain located?"      Right leg-thigh area- outer 3. PAIN: "How bad is the pain?"    (Scale 1-10; or mild, moderate, severe)   -  MILD (1-3): doesn't interfere with normal activities    -  MODERATE (4-7): interferes with normal activities (e.g., work or school) or awakens from sleep, limping    -  SEVERE (8-10): excruciating pain, unable to do any normal activities, unable to walk     moderate 4. WORK OR EXERCISE: "Has there been any recent work or exercise that involved this part of the body?"      Walking seemed to help yesterday 5. CAUSE: "What do you think is causing the leg pain?"     Possible hamstring pull 6. OTHER SYMPTOMS: "Do you have any other symptoms?" (e.g., chest pain, back pain, breathing difficulty, swelling, rash, fever, numbness, weakness)     Back pain  Protocols used: Leg Pain-A-AH

## 2022-03-30 NOTE — Discharge Instructions (Addendum)
I recommend using tylenol 500 mg every 4-6 hours as needed for pain  [facility] sells "Equate" brand 100 tablets of tylenol for $1.98  You can also take the muscle relaxer at bedtime. This may help with leg pain.  Please follow with your primary care provider if symptoms persist.
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
## 2022-03-31 ENCOUNTER — Encounter: Payer: Self-pay | Admitting: Critical Care Medicine

## 2022-04-02 ENCOUNTER — Encounter: Payer: Self-pay | Admitting: Critical Care Medicine

## 2022-04-02 NOTE — Telephone Encounter (Signed)
Pt needs appt sooner for leg pain ok to double book

## 2022-04-02 NOTE — Telephone Encounter (Signed)
Noted  

## 2022-04-03 ENCOUNTER — Ambulatory Visit: Payer: Medicaid Other | Admitting: Critical Care Medicine

## 2022-04-04 ENCOUNTER — Ambulatory Visit: Payer: Self-pay

## 2022-04-04 ENCOUNTER — Telehealth: Payer: Medicaid Other | Admitting: Physician Assistant

## 2022-04-04 DIAGNOSIS — R110 Nausea: Secondary | ICD-10-CM | POA: Diagnosis not present

## 2022-04-04 MED ORDER — ONDANSETRON HCL 4 MG PO TABS: 4.0000 mg | ORAL_TABLET | Freq: Three times a day (TID) | ORAL | 0 refills | Status: DC | PRN

## 2022-04-04 NOTE — Telephone Encounter (Signed)
Message from Shan Levans sent at 04/04/2022  3:04 PM EDT  Summary: Nausea   Patient states that he had nausea today and would like something to help relieve the symptoms.            Chief Complaint: nausea Symptoms: fatigue Frequency: today Pertinent Negatives: Patient denies vomiting Disposition: '[]'$ ED /'[]'$ Urgent Care (no appt availability in office) / '[]'$ Appointment(In office/virtual)/ '[x]'$  [facility]/ '[]'$ Home Care/ '[]'$ Refused Recommended Disposition /'[]'$ [facility]/ '[]'$  Follow-up with PCP Additional Notes:   Reason for Disposition  Unexplained nausea  Answer Assessment - Initial Assessment Questions 1. NAUSEA SEVERITY: "How bad is the nausea?" (e.g., mild, moderate, severe; dehydration, weight loss)   - MILD: loss of appetite without change in eating habits   - MODERATE: decreased oral intake without significant weight loss, dehydration, or malnutrition   - SEVERE: inadequate caloric or fluid intake, significant weight loss, symptoms of dehydration     moderate 2. ONSET: "When did the nausea begin?"     today 3. VOMITING: "Any vomiting?" If Yes, ask: "How many times today?"     no 4. RECURRENT SYMPTOM: "Have you had nausea before?" If Yes, ask: "When was the last time?" "What happened that time?"     Stomach feels stuff no appetite 5. CAUSE: "What do you think is causing the nausea?"     fatigue  Protocols used: Nausea-A-AH

## 2022-04-04 NOTE — Patient Instructions (Signed)
[name], thank you for joining [name], PA-C for today's virtual visit.  While this provider is not your primary care provider (PCP), if your PCP is located in our provider database this encounter information will be shared with them immediately following your visit.   [facility] account gives you access to today's visit and all your visits, tests, and labs performed at [facility] " click here if you don't have a [facility] account or go to [email or web address]  Consent: (Patient) Jeremiah Hunter provided verbal consent for this virtual visit at the beginning of the encounter.  Current Medications:  Current Outpatient Medications:    ondansetron (ZOFRAN) 4 MG tablet, Take 1 tablet (4 mg total) by mouth every 8 (eight) hours as needed for nausea or vomiting., Disp: 20 tablet, Rfl: 0   amLODipine (NORVASC) 5 MG tablet, Take 1 tablet (5 mg total) by mouth daily. TAKE 1 TABLET(5 MG) BY MOUTH DAILY, Disp: 90 tablet, Rfl: 1   cyclobenzaprine (FLEXERIL) 5 MG tablet, Take 1 tablet (5 mg total) by mouth at bedtime., Disp: 15 tablet, Rfl: 0   divalproex (DEPAKOTE ER) 500 MG 24 hr tablet, Take 1 tablet (500 mg total) by mouth in the morning and at bedtime., Disp: 60 tablet, Rfl: 4   fluticasone (FLONASE) 50 MCG/ACT nasal spray, Place 2 sprays into both nostrils daily., Disp: 16 g, Rfl: 6   gabapentin (NEURONTIN) 300 MG capsule, Take 2 capsules (600 mg total) by mouth 3 (three) times daily., Disp: 180 capsule, Rfl: 3   OXYTROL 3.9 MG/24HR, Apply patch two times a week, Disp: 8 patch, Rfl: 2   Vitamin D, Ergocalciferol, (DRISDOL) 1.25 MG (50000 UNIT) CAPS capsule, Take 1 capsule (50,000 Units total) by mouth every Sunday., Disp: 12 capsule, Rfl: 2   Medications ordered in this encounter:  Meds ordered this encounter  Medications   ondansetron (ZOFRAN) 4 MG tablet    Sig: Take 1 tablet (4 mg total) by mouth every 8 (eight) hours as needed for nausea or  vomiting.    Dispense:  20 tablet    Refill:  0    Order Specific Question:   Supervising Provider    Answer:   Chase Picket [id number]     *If you need refills on other medications prior to your next appointment, please contact your pharmacy*  Follow-Up: Call back or seek an in-person evaluation if the symptoms worsen or if the condition fails to improve as anticipated.  [facility] [phone]  Other Instructions  Nausea, Adult Nausea is the feeling of having an upset stomach or that you are about to vomit. Nausea on its own is not usually a serious concern, but it may be an early sign of a more serious medical problem. As nausea gets worse, it can lead to vomiting. If vomiting develops, or if you are not able to drink enough fluids, you are at risk of becoming dehydrated. Dehydration can make you tired and thirsty, cause you to have a dry mouth, and decrease how often you urinate. Older adults and people with other diseases or a weak disease-fighting system (immune system) are at higher risk for dehydration. The main goals of treating your nausea are: To relieve your nausea. To limit repeated nausea episodes. To prevent vomiting and dehydration. Follow these instructions at home: Watch your symptoms for any changes. Tell your health care provider about them. Eating and drinking     Take an oral rehydration solution (  ORS). This is a drink that is sold at pharmacies and retail stores. Drink clear fluids slowly and in small amounts as you are able. Clear fluids include water, ice chips, low-calorie sports drinks, and fruit juice that has water added (diluted fruit juice). Eat bland, easy-to-digest foods in small amounts as you are able. These foods include bananas, applesauce, rice, lean meats, toast, and crackers. Avoid drinking fluids that contain a lot of sugar or caffeine, such as energy drinks, sports drinks, and soda. Avoid alcohol. Avoid spicy or fatty  foods. General instructions Take over-the-counter and prescription medicines only as told by your health care provider. Rest at home while you recover. Drink enough fluid to keep your urine pale yellow. Breathe slowly and deeply when you feel nauseous. Avoid smelling things that have strong odors. Wash your hands often using soap and water for at least 20 seconds. If soap and water are not available, use hand sanitizer. Make sure that everyone in your household washes their hands well and often. Keep all follow-up visits. This is important. Contact a health care provider if: Your nausea gets worse. Your nausea does not go away after two days. You vomit multiple times. You cannot drink fluids without vomiting. You have any of the following: New symptoms. A fever. A headache. Muscle cramps. A rash. Pain while urinating. You feel light-headed or dizzy. Get help right away if: You have pain in your chest, neck, arm, or jaw. You feel extremely weak or you faint. You have vomit that is bright red or looks like coffee grounds. You have bloody or black stools (feces) or stools that look like tar. You have a severe headache, a stiff neck, or both. You have severe pain, cramping, or bloating in your abdomen. You have difficulty breathing or are breathing very quickly. Your heart is beating very quickly. Your skin feels cold and clammy. You feel confused. You have signs of dehydration, such as: Dark urine, very little urine, or no urine. Cracked lips. Dry mouth. Sunken eyes. Sleepiness. Weakness. These symptoms may be an emergency. Get help right away. Call 911. Do not wait to see if the symptoms will go away. Do not drive yourself to the hospital. Summary Nausea is the feeling that you have an upset stomach or that you are about to vomit. Nausea on its own is not usually a serious concern, but it may be an early sign of a more serious medical problem. If vomiting develops, or if  you are not able to drink enough fluids, you are at risk of becoming dehydrated. Follow recommendations for eating and drinking and take over-the-counter and prescription medicines only as told by your health care provider. Contact a health care provider right away if your symptoms worsen or you have new symptoms. Keep all follow-up visits. This is important. This information is not intended to replace advice given to you by your health care provider. Make sure you discuss any questions you have with your health care provider. Document Revised: 07/15/2020 Document Reviewed: 07/15/2020 Elsevier Patient Education  [place].    If you have been instructed to have an in-person evaluation today at a local Urgent Care facility, please use the link below. It will take you to a list of all of our available [facility], including address, phone number and hours of operation. Please do not delay care.  [facility]  If you or a family member do not have a primary care provider, use the  link below to schedule a visit and establish care. When you choose a [facility] primary care physician or advanced practice provider, you gain a long-term partner in health. Find a Primary Care Provider  Learn more about [facility]'s in-office and virtual care options: [facility]

## 2022-04-04 NOTE — Progress Notes (Signed)
Virtual Visit Consent   Jeremiah Hunter, you are scheduled for a virtual visit with a [facility] provider today. Just as with appointments in the office, your consent must be obtained to participate. Your consent will be active for this visit and any virtual visit you may have with one of our providers in the next 365 days. If you have a MyChart account, a copy of this consent can be sent to you electronically.  As this is a virtual visit, video technology does not allow for your provider to perform a traditional examination. This may limit your provider's ability to fully assess your condition. If your provider identifies any concerns that need to be evaluated in person or the need to arrange testing (such as labs, EKG, etc.), we will make arrangements to do so. Although advances in technology are sophisticated, we cannot ensure that it will always work on either your end or our end. If the connection with a video visit is poor, the visit may have to be switched to a telephone visit. With either a video or telephone visit, we are not always able to ensure that we have a secure connection.  By engaging in this virtual visit, you consent to the provision of healthcare and authorize for your insurance to be billed (if applicable) for the services provided during this visit. Depending on your insurance coverage, you may receive a charge related to this service.  I need to obtain your verbal consent now. Are you willing to proceed with your visit today? Jeremiah Hunter has provided verbal consent on 04/04/2022 for a virtual visit (video or telephone). [name], PA-C  Date: 04/04/2022 4:27 PM  Virtual Visit via Video Note   I, [name], connected with  Jeremiah Hunter  ([id number], 09/15/1960) on 04/04/22 at  4:15 PM EDT by a video-enabled telemedicine application and verified that I am speaking with the correct person using two identifiers.  Location: Patient: Virtual Visit Location  Patient: Home Provider: Virtual Visit Location Provider: Home Office   I discussed the limitations of evaluation and management by telemedicine and the availability of in person appointments. The patient expressed understanding and agreed to proceed.    History of Present Illness: Jeremiah Hunter is a 62 y.o. who identifies as a male who was assigned male at birth, and is being seen today for nausea.  HPI: GI Problem The primary symptoms include fatigue and nausea. Primary symptoms do not include fever, vomiting or diarrhea. The illness began today. The onset was sudden. The problem has not changed since onset. Nausea began today. The nausea is associated with eating.     Problems:  Patient Active Problem List   Diagnosis Date Noted   Stuttering 03/01/2022   Schizoaffective disorder, bipolar type ([place]) 01/03/2022   Involuntary commitment 01/03/2022   Myelomalacia of cervical cord ([place]) 12/06/2021   Other spondylosis with myelopathy, cervical region 10/10/2021   Cervical spine disease 10/03/2021   Urinary retention 09/04/2021   Leg cramps 06/29/2021   Aphthous ulcer 06/21/2021   Vitamin D deficiency 06/13/2021   Pain due to onychomycosis of toenails of both feet 03/13/2021   Itching 01/26/2021   Frequent falls 12/08/2020   Lumbar foraminal stenosis 12/07/2020   DDD (degenerative disc disease), lumbar    Prolapsed internal hemorrhoids, grade 3 09/25/2019   Meralgia paresthetica of left side 09/11/2019   Bipolar affective disorder, currently depressed, moderate ([place]) 09/11/2019   External hemorrhoids with complication 99991111   Overactive bladder 06/11/2019  OSA (obstructive sleep apnea) 03/24/2019   Essential hypertension 03/24/2019   Seizure disorder ([place]) 03/24/2019    Allergies: No Known Allergies Medications:  Current Outpatient Medications:    ondansetron (ZOFRAN) 4 MG tablet, Take 1 tablet (4 mg total) by mouth every 8 (eight) hours as needed for nausea or vomiting.,  Disp: 20 tablet, Rfl: 0   amLODipine (NORVASC) 5 MG tablet, Take 1 tablet (5 mg total) by mouth daily. TAKE 1 TABLET(5 MG) BY MOUTH DAILY, Disp: 90 tablet, Rfl: 1   cyclobenzaprine (FLEXERIL) 5 MG tablet, Take 1 tablet (5 mg total) by mouth at bedtime., Disp: 15 tablet, Rfl: 0   divalproex (DEPAKOTE ER) 500 MG 24 hr tablet, Take 1 tablet (500 mg total) by mouth in the morning and at bedtime., Disp: 60 tablet, Rfl: 4   fluticasone (FLONASE) 50 MCG/ACT nasal spray, Place 2 sprays into both nostrils daily., Disp: 16 g, Rfl: 6   gabapentin (NEURONTIN) 300 MG capsule, Take 2 capsules (600 mg total) by mouth 3 (three) times daily., Disp: 180 capsule, Rfl: 3   OXYTROL 3.9 MG/24HR, Apply patch two times a week, Disp: 8 patch, Rfl: 2   Vitamin D, Ergocalciferol, (DRISDOL) 1.25 MG (50000 UNIT) CAPS capsule, Take 1 capsule (50,000 Units total) by mouth every Sunday., Disp: 12 capsule, Rfl: 2  Observations/Objective: Patient is well-developed, well-nourished in no acute distress.  Resting comfortably at home.  Head is normocephalic, atraumatic.  No labored breathing.  Speech is clear and coherent with logical content.  Patient is alert and oriented at baseline.    Assessment and Plan: 1. Nausea - ondansetron (ZOFRAN) 4 MG tablet; Take 1 tablet (4 mg total) by mouth every 8 (eight) hours as needed for nausea or vomiting.  Dispense: 20 tablet; Refill: 0  - Acute nausea today without vomiting or diarrhea - Decreased appetite - Zofran provided - Push fluids - Seek in person evaluation if symptoms worsen or fail to improve  Follow Up Instructions: I discussed the assessment and treatment plan with the patient. The patient was provided an opportunity to ask questions and all were answered. The patient agreed with the plan and demonstrated an understanding of the instructions.  A copy of instructions were sent to the patient via MyChart unless otherwise noted below.    The patient was advised to call  back or seek an in-person evaluation if the symptoms worsen or if the condition fails to improve as anticipated.  Time:  I spent 10 minutes with the patient via telehealth technology discussing the above problems/concerns.    [name], PA-C

## 2022-04-05 ENCOUNTER — Ambulatory Visit (AMBULATORY_SURGERY_CENTER): Payer: Medicaid Other | Admitting: *Deleted

## 2022-04-05 VITALS — Ht 71.0 in | Wt 197.0 lb

## 2022-04-05 DIAGNOSIS — Z1211 Encounter for screening for malignant neoplasm of colon: Secondary | ICD-10-CM

## 2022-04-05 MED ORDER — PEG 3350-KCL-NA BICARB-NACL 420 G PO SOLR: 4000.0000 mL | Freq: Once | ORAL | 0 refills | Status: AC

## 2022-04-05 NOTE — Progress Notes (Signed)
No egg or soy allergy known to patient  No issues known to pt with past sedation with any surgeries or procedures Patient denies ever being  intubated No issues with moving head or neck No issues with swallowing No FH of Malignant Hyperthermia Pt is not on diet pills Pt is not on  home 02  Pt is not on blood thinners  Pt denies issues with constipation  Pt is not on dialysis Pt denies any upcoming cardiac testing Pt encouraged to use to use Singlecare or Goodrx to reduce cost  \Patient's chart reviewed by Osvaldo Angst CNRA prior to previsit and patient appropriate for the [place].  Previsit completed and red dot placed by patient's name on their procedure day (on provider's schedule).  . Visit by phone Pt weighed self at home and stated wt is 197lb Pt states he has PTSD, bipolar DO and Schizophrenia Instructions reviewed with pt and pt states understanding. Instructed to review again prior to procedure. Instructions sent to my chart and by mail.

## 2022-04-06 ENCOUNTER — Encounter: Payer: Self-pay | Admitting: Critical Care Medicine

## 2022-04-06 ENCOUNTER — Telehealth: Payer: Medicaid Other | Admitting: Family Medicine

## 2022-04-06 DIAGNOSIS — Z77120 Contact with and (suspected) exposure to mold (toxic): Secondary | ICD-10-CM | POA: Diagnosis not present

## 2022-04-06 NOTE — Patient Instructions (Signed)
  [name], thank you for joining Perlie Mayo, NP for today's virtual visit.  While this provider is not your primary care provider (PCP), if your PCP is located in our provider database this encounter information will be shared with them immediately following your visit.   [facility] account gives you access to today's visit and all your visits, tests, and labs performed at [facility] " click here if you don't have a [facility] account or go to [email or web address]  Consent: (Patient) Jeremiah Hunter provided verbal consent for this virtual visit at the beginning of the encounter.  Current Medications:  Current Outpatient Medications:    amLODipine (NORVASC) 5 MG tablet, Take 1 tablet (5 mg total) by mouth daily. TAKE 1 TABLET(5 MG) BY MOUTH DAILY, Disp: 90 tablet, Rfl: 1   cyclobenzaprine (FLEXERIL) 5 MG tablet, Take 1 tablet (5 mg total) by mouth at bedtime. (Patient not taking: Reported on 04/05/2022), Disp: 15 tablet, Rfl: 0   divalproex (DEPAKOTE ER) 500 MG 24 hr tablet, Take 1 tablet (500 mg total) by mouth in the morning and at bedtime., Disp: 60 tablet, Rfl: 4   fluticasone (FLONASE) 50 MCG/ACT nasal spray, Place 2 sprays into both nostrils daily., Disp: 16 g, Rfl: 6   gabapentin (NEURONTIN) 300 MG capsule, Take 2 capsules (600 mg total) by mouth 3 (three) times daily., Disp: 180 capsule, Rfl: 3   ondansetron (ZOFRAN) 4 MG tablet, Take 1 tablet (4 mg total) by mouth every 8 (eight) hours as needed for nausea or vomiting., Disp: 20 tablet, Rfl: 0   OXYTROL 3.9 MG/24HR, Apply patch two times a week, Disp: 8 patch, Rfl: 2   Vitamin D, Ergocalciferol, (DRISDOL) 1.25 MG (50000 UNIT) CAPS capsule, Take 1 capsule (50,000 Units total) by mouth every Sunday., Disp: 12 capsule, Rfl: 2   Medications ordered in this encounter:  No orders of the defined types were placed in this encounter.    *If you need refills on other medications prior to your next  appointment, please contact your pharmacy*  Follow-Up: Call back or seek an in-person evaluation if the symptoms worsen or if the condition fails to improve as anticipated.  [facility] [phone]  Other Instructions  Please call your PCP office to see about getting a sooner appt    If you have been instructed to have an in-person evaluation today at a local Urgent Care facility, please use the link below. It will take you to a list of all of our available [facility], including address, phone number and hours of operation. Please do not delay care.  [facility]  If you or a family member do not have a primary care provider, use the link below to schedule a visit and establish care. When you choose a [facility] primary care physician or advanced practice provider, you gain a long-term partner in health. Find a Primary Care Provider  Learn more about [facility] in-office and virtual care options: [facility]

## 2022-04-06 NOTE — Progress Notes (Signed)
Virtual Visit Consent   Jeremiah Hunter, you are scheduled for a virtual visit with a [facility] provider today. Just as with appointments in the office, your consent must be obtained to participate. Your consent will be active for this visit and any virtual visit you may have with one of our providers in the next 365 days. If you have a MyChart account, a copy of this consent can be sent to you electronically.  As this is a virtual visit, video technology does not allow for your provider to perform a traditional examination. This may limit your provider's ability to fully assess your condition. If your provider identifies any concerns that need to be evaluated in person or the need to arrange testing (such as labs, EKG, etc.), we will make arrangements to do so. Although advances in technology are sophisticated, we cannot ensure that it will always work on either your end or our end. If the connection with a video visit is poor, the visit may have to be switched to a telephone visit. With either a video or telephone visit, we are not always able to ensure that we have a secure connection.  By engaging in this virtual visit, you consent to the provision of healthcare and authorize for your insurance to be billed (if applicable) for the services provided during this visit. Depending on your insurance coverage, you may receive a charge related to this service.  I need to obtain your verbal consent now. Are you willing to proceed with your visit today? Jeremiah Hunter has provided verbal consent on 04/06/2022 for a virtual visit (video or telephone). Jeremiah Mayo, NP  Date: 04/06/2022 11:19 AM  Virtual Visit via Video Note   I, Jeremiah Hunter, connected with  Jeremiah Hunter  ([id number], 24-Mar-1960) on 04/06/22 at 11:00 AM EDT by a video-enabled telemedicine application and verified that I am speaking with the correct person using two identifiers.  Location: Patient: Virtual Visit Location Patient: Other:  sitting in car Provider: Virtual Visit Location Provider: Home Office   I discussed the limitations of evaluation and management by telemedicine and the availability of in person appointments. The patient expressed understanding and agreed to proceed.    History of Present Illness: Jeremiah Hunter is a 62 y.o. who identifies as a male who was assigned male at birth, and is being seen today for on going headaches and breathing concerns related to mold. Jeremiah Hunter and his Aide present during visit. He gave her permission to speak with me on his behalf. I was informed that there is mold in the apartment complex he is living in- off [address] and Jeremiah Hunter reports increased health issues over the last several months. They have been reaching out to city of [place] and Secondary school teacher, Manufacturing systems engineer. Property manger refusing to help.  Mold photos sent to PCP office via mychart. Requesting a sooner appt given the on going symptoms. Current appt 04/18/22  .   Problems:  Patient Active Problem List   Diagnosis Date Noted   Stuttering 03/01/2022   Schizoaffective disorder, bipolar type ([place]) 01/03/2022   Involuntary commitment 01/03/2022   Myelomalacia of cervical cord ([place]) 12/06/2021   Other spondylosis with myelopathy, cervical region 10/10/2021   Cervical spine disease 10/03/2021   Urinary retention 09/04/2021   Leg cramps 06/29/2021   Aphthous ulcer 06/21/2021   Vitamin D deficiency 06/13/2021   Pain due to onychomycosis of toenails of both feet 03/13/2021   Itching 01/26/2021   Frequent  falls 12/08/2020   Lumbar foraminal stenosis 12/07/2020   DDD (degenerative disc disease), lumbar    Prolapsed internal hemorrhoids, grade 3 09/25/2019   Meralgia paresthetica of left side 09/11/2019   Bipolar affective disorder, currently depressed, moderate ([place]) 09/11/2019   External hemorrhoids with complication 99991111   Overactive bladder 06/11/2019   OSA (obstructive sleep  apnea) 03/24/2019   Essential hypertension 03/24/2019   Seizure disorder ([place]) 03/24/2019    Allergies: No Known Allergies Medications:  Current Outpatient Medications:    amLODipine (NORVASC) 5 MG tablet, Take 1 tablet (5 mg total) by mouth daily. TAKE 1 TABLET(5 MG) BY MOUTH DAILY, Disp: 90 tablet, Rfl: 1   cyclobenzaprine (FLEXERIL) 5 MG tablet, Take 1 tablet (5 mg total) by mouth at bedtime. (Patient not taking: Reported on 04/05/2022), Disp: 15 tablet, Rfl: 0   divalproex (DEPAKOTE ER) 500 MG 24 hr tablet, Take 1 tablet (500 mg total) by mouth in the morning and at bedtime., Disp: 60 tablet, Rfl: 4   fluticasone (FLONASE) 50 MCG/ACT nasal spray, Place 2 sprays into both nostrils daily., Disp: 16 g, Rfl: 6   gabapentin (NEURONTIN) 300 MG capsule, Take 2 capsules (600 mg total) by mouth 3 (three) times daily., Disp: 180 capsule, Rfl: 3   ondansetron (ZOFRAN) 4 MG tablet, Take 1 tablet (4 mg total) by mouth every 8 (eight) hours as needed for nausea or vomiting., Disp: 20 tablet, Rfl: 0   OXYTROL 3.9 MG/24HR, Apply patch two times a week, Disp: 8 patch, Rfl: 2   Vitamin D, Ergocalciferol, (DRISDOL) 1.25 MG (50000 UNIT) CAPS capsule, Take 1 capsule (50,000 Units total) by mouth every Sunday., Disp: 12 capsule, Rfl: 2  Observations/Objective: Patient is well-developed, well-nourished in no acute distress.  Resting comfortably  at home.  Head is normocephalic, atraumatic.  No labored breathing.  Speech is clear and coherent with logical content.  Patient is alert and oriented at baseline.    Assessment and Plan:  1. Mold suspected exposure  Given the symptoms present Advised to call PCP office for in person eval Perhaps office has resources to help navigate this situation as well   Follow Up Instructions: I discussed the assessment and treatment plan with the patient. The patient was provided an opportunity to ask questions and all were answered. The patient agreed with the plan and  demonstrated an understanding of the instructions.  A copy of instructions were sent to the patient via MyChart unless otherwise noted below.    The patient was advised to call back or seek an in-person evaluation if the symptoms worsen or if the condition fails to improve as anticipated.  Time:  I spent 10 minutes with the patient via telehealth technology discussing the above problems/concerns.    Jeremiah Mayo, NP

## 2022-04-08 ENCOUNTER — Encounter: Payer: Self-pay | Admitting: Critical Care Medicine

## 2022-04-08 DIAGNOSIS — Z91048 Other nonmedicinal substance allergy status: Secondary | ICD-10-CM

## 2022-04-09 ENCOUNTER — Encounter: Payer: Self-pay | Admitting: Critical Care Medicine

## 2022-04-09 DIAGNOSIS — Z91048 Other nonmedicinal substance allergy status: Secondary | ICD-10-CM | POA: Insufficient documentation

## 2022-04-09 NOTE — Telephone Encounter (Signed)
Carly pt needs lab visit scheduled pls call the patient  also a letter was produced and is on printer pls affix my signature for pt to pick up or be delivered

## 2022-04-10 ENCOUNTER — Ambulatory Visit: Payer: Medicaid Other | Attending: Critical Care Medicine

## 2022-04-10 DIAGNOSIS — Z91048 Other nonmedicinal substance allergy status: Secondary | ICD-10-CM

## 2022-04-11 ENCOUNTER — Telehealth: Payer: Self-pay | Admitting: Emergency Medicine

## 2022-04-11 ENCOUNTER — Encounter: Payer: Self-pay | Admitting: Critical Care Medicine

## 2022-04-11 NOTE — Telephone Encounter (Signed)
Letter will be sent, called patient

## 2022-04-11 NOTE — Telephone Encounter (Signed)
Copied from [place] [phone]. Topic: Referral - Request for Referral >> Apr 11, 2022  8:02 AM Ludger Nutting wrote: Has patient seen PCP for this complaint? Yes.   *If NO, is insurance requiring patient see PCP for this issue before PCP can refer them? Referral for which specialty: Neurology Preferred provider/office:  Reason for referral: Numbness in right foot/leg

## 2022-04-12 NOTE — Telephone Encounter (Signed)
Pt needs appt in next two weeks

## 2022-04-15 ENCOUNTER — Ambulatory Visit (INDEPENDENT_AMBULATORY_CARE_PROVIDER_SITE_OTHER): Payer: Medicaid Other

## 2022-04-15 ENCOUNTER — Ambulatory Visit
Admission: EM | Admit: 2022-04-15 | Discharge: 2022-04-15 | Disposition: A | Discharge status: Home / Self Care | Payer: Medicaid Other | Attending: Internal Medicine | Admitting: Internal Medicine

## 2022-04-15 DIAGNOSIS — W19XXXA Unspecified fall, initial encounter: Secondary | ICD-10-CM | POA: Diagnosis not present

## 2022-04-15 DIAGNOSIS — S42002A Fracture of unspecified part of left clavicle, initial encounter for closed fracture: Secondary | ICD-10-CM

## 2022-04-15 LAB — ALLERGEN PROFILE, MOLD
Alternaria Alternata IgE: <0.1 kU/L
Aspergillus Fumigatus IgE: <0.1 kU/L
Aureobasidi Pullulans IgE: <0.1 kU/L
Candida Albicans IgE: 0.15 kU/L — AB
Cladosporium Herbarum IgE: <0.1 kU/L
M009-IgE Fusarium proliferatum: <0.1 kU/L
M014-IgE Epicoccum purpur: <0.1 kU/L
Mucor Racemosus IgE: <0.1 kU/L
Penicillium Chrysogen IgE: <0.1 kU/L
Phoma Betae IgE: <0.1 kU/L
Setomelanomma Rostrat: <0.1 kU/L
Stemphylium Herbarum IgE: <0.1 kU/L

## 2022-04-15 MED ORDER — ACETAMINOPHEN 500 MG PO TABS: 500.0000 mg | ORAL_TABLET | Freq: Four times a day (QID) | ORAL | 0 refills | Status: DC | PRN

## 2022-04-15 NOTE — Discharge Instructions (Addendum)
Sling has been applied for your clavicle fracture.  No lifting, pushing, pulling, weightbearing until otherwise advised by orthopedist.  Call tomorrow to schedule appointment with orthopedist for further evaluation and management.  Apply ice to area.

## 2022-04-15 NOTE — ED Triage Notes (Signed)
Pt c/o falling outside on concrete suddenly today landing on his left shoulder causing pain and LROM.

## 2022-04-15 NOTE — Progress Notes (Signed)
Let pt know no allergy seen
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
## 2022-04-16 ENCOUNTER — Encounter: Payer: Self-pay | Admitting: Critical Care Medicine

## 2022-04-16 ENCOUNTER — Telehealth: Payer: Self-pay

## 2022-04-16 ENCOUNTER — Ambulatory Visit: Payer: Self-pay | Admitting: *Deleted

## 2022-04-16 ENCOUNTER — Emergency Department (HOSPITAL_COMMUNITY)
Admission: EM | Admit: 2022-04-16 | Discharge: 2022-04-17 | Disposition: A | Discharge status: Home / Self Care | Payer: Medicaid Other | Attending: Emergency Medicine | Admitting: Emergency Medicine

## 2022-04-16 DIAGNOSIS — M79604 Pain in right leg: Secondary | ICD-10-CM

## 2022-04-16 DIAGNOSIS — M79661 Pain in right lower leg: Secondary | ICD-10-CM | POA: Diagnosis not present

## 2022-04-16 DIAGNOSIS — S42022A Displaced fracture of shaft of left clavicle, initial encounter for closed fracture: Secondary | ICD-10-CM

## 2022-04-16 NOTE — ED Triage Notes (Addendum)
Original triage note for incorrect patient

## 2022-04-16 NOTE — Telephone Encounter (Signed)
Pt was called and is aware of results, DOB was confirmed.  ?

## 2022-04-16 NOTE — Telephone Encounter (Signed)
  Chief Complaint: Foot pain, collar bone fractured Symptoms: Fell yesterday, ED eval, displaced fx of collar bone. States left message for orthopedics, unsure where he called, left them VM to call back. States has not heard back and requesting Dr. Joya Gaskins to call and expedite appt. Also reports right foot "Burning at bottom and into my toes." 10/10. States has had in past "Got all better now this starting again after fall."  States radiates to calf at times, no redness or swelling. Requests pain med.  Frequency: Yesterday Pertinent Negatives: Patient denies  Disposition: [] ED /[] Urgent Care (no appt availability in office) / [] Appointment(In office/virtual)/ [x]  [facility]/ [] Home Care/ [] Refused Recommended Disposition /[] [facility]/ []  Follow-up with PCP Additional Notes: No availability at practice, advised UC. States he will go to [employer]. Advised ED for worsening symptoms. Pt verbalizes understanding. Pt also sent MyChart message. Please advise.  Reason for Disposition  [1] SEVERE pain (e.g., excruciating, unable to do any normal activities) AND [2] not improved after 2 hours of pain medicine  Answer Assessment - Initial Assessment Questions 1. ONSET: "When did the pain start?"      Yesterday, shortly after fall 2. LOCATION: "Where is the pain located?"      Bottom of right foot and in toes 3. PAIN: "How bad is the pain?"    (Scale 1-10; or mild, moderate, severe)  - MILD (1-3): doesn't interfere with normal activities.   - MODERATE (4-7): interferes with normal activities (e.g., work or school) or awakens from sleep, limping.   - SEVERE (8-10): excruciating pain, unable to do any normal activities, unable to walk.      Burning type pain.  10/10 4. WORK OR EXERCISE: "Has there been any recent work or exercise that involved this part of the body?"      No 5. CAUSE: "What do you think is causing the foot pain?"     Unsure 6. OTHER SYMPTOMS: "Do you  have any other symptoms?" (e.g., leg pain, rash, fever, numbness)     Radiates to calf, no redness or swelling  Protocols used: Foot Pain-A-AH

## 2022-04-17 ENCOUNTER — Emergency Department (HOSPITAL_COMMUNITY): Payer: Medicaid Other

## 2022-04-17 ENCOUNTER — Other Ambulatory Visit: Payer: Self-pay

## 2022-04-17 LAB — CBC
HCT: 38.5 % — ABNORMAL LOW (ref 39.0–52.0)
Hemoglobin: 12.5 g/dL — ABNORMAL LOW (ref 13.0–17.0)
MCH: 27.5 pg (ref 26.0–34.0)
MCHC: 32.5 g/dL (ref 30.0–36.0)
MCV: 84.8 fL (ref 80.0–100.0)
Platelets: 226 10*3/uL (ref 150–400)
RBC: 4.54 MIL/uL (ref 4.22–5.81)
RDW: 15.7 % — ABNORMAL HIGH (ref 11.5–15.5)
WBC: 5 10*3/uL (ref 4.0–10.5)
nRBC: 0 % (ref 0.0–0.2)

## 2022-04-17 LAB — BASIC METABOLIC PANEL
Anion gap: 6 (ref 5–15)
BUN: 9 mg/dL (ref 6–20)
CO2: 25 mmol/L (ref 22–32)
Calcium: 8.7 mg/dL — ABNORMAL LOW (ref 8.9–10.3)
Chloride: 105 mmol/L (ref 98–111)
Creatinine, Ser: 1.03 mg/dL (ref 0.61–1.24)
GFR, Estimated: >60 mL/min (ref >60)
Glucose, Bld: 137 mg/dL — ABNORMAL HIGH (ref 70–99)
Potassium: 3.3 mmol/L — ABNORMAL LOW (ref 3.5–5.1)
Sodium: 136 mmol/L (ref 135–145)

## 2022-04-17 LAB — CBC WITH DIFFERENTIAL/PLATELET
Abs Immature Granulocytes: 0 10*3/uL (ref 0.00–0.07)
Basophils Absolute: 0 10*3/uL (ref 0.0–0.1)
Basophils Relative: 1 %
Eosinophils Absolute: 0.1 10*3/uL (ref 0.0–0.5)
Eosinophils Relative: 2 %
HCT: 35.8 % — ABNORMAL LOW (ref 39.0–52.0)
Hemoglobin: 11.8 g/dL — ABNORMAL LOW (ref 13.0–17.0)
Immature Granulocytes: 0 %
Lymphocytes Relative: 43 %
Lymphs Abs: 1.8 10*3/uL (ref 0.7–4.0)
MCH: 27.9 pg (ref 26.0–34.0)
MCHC: 33 g/dL (ref 30.0–36.0)
MCV: 84.6 fL (ref 80.0–100.0)
Monocytes Absolute: 0.3 10*3/uL (ref 0.1–1.0)
Monocytes Relative: 8 %
Neutro Abs: 1.9 10*3/uL (ref 1.7–7.7)
Neutrophils Relative %: 46 %
Platelets: 164 10*3/uL (ref 150–400)
RBC: 4.23 MIL/uL (ref 4.22–5.81)
RDW: 15.2 % (ref 11.5–15.5)
WBC: 4.1 10*3/uL (ref 4.0–10.5)
nRBC: 0 % (ref 0.0–0.2)

## 2022-04-17 LAB — COMPREHENSIVE METABOLIC PANEL
ALT: 43 U/L (ref 0–44)
AST: 55 U/L — ABNORMAL HIGH (ref 15–41)
Albumin: 2.8 g/dL — ABNORMAL LOW (ref 3.5–5.0)
Alkaline Phosphatase: 60 U/L (ref 38–126)
Anion gap: 9 (ref 5–15)
BUN: 11 mg/dL (ref 6–20)
CO2: 23 mmol/L (ref 22–32)
Calcium: 8.1 mg/dL — ABNORMAL LOW (ref 8.9–10.3)
Chloride: 100 mmol/L (ref 98–111)
Creatinine, Ser: 1.28 mg/dL — ABNORMAL HIGH (ref 0.61–1.24)
GFR, Estimated: >60 mL/min (ref >60)
Glucose, Bld: 163 mg/dL — ABNORMAL HIGH (ref 70–99)
Potassium: 4.1 mmol/L (ref 3.5–5.1)
Sodium: 132 mmol/L — ABNORMAL LOW (ref 135–145)
Total Bilirubin: 0.3 mg/dL (ref 0.3–1.2)
Total Protein: 6.8 g/dL (ref 6.5–8.1)

## 2022-04-17 LAB — ETHANOL: Alcohol, Ethyl (B): 286 mg/dL — ABNORMAL HIGH (ref <10)

## 2022-04-17 MED ORDER — POTASSIUM CHLORIDE CRYS ER 20 MEQ PO TBCR
30.0000 meq | EXTENDED_RELEASE_TABLET | Freq: Once | ORAL | Status: AC
  Administered 2022-04-17: 30 meq via ORAL
  Filled 2022-04-17: qty 1

## 2022-04-17 MED ORDER — HYDROCODONE-ACETAMINOPHEN 5-325 MG PO TABS
1.0000 | ORAL_TABLET | Freq: Once | ORAL | Status: AC
  Administered 2022-04-17: 1 via ORAL
  Filled 2022-04-17: qty 1

## 2022-04-17 MED ORDER — IBUPROFEN 600 MG PO TABS: 600.0000 mg | ORAL_TABLET | Freq: Four times a day (QID) | ORAL | 0 refills | Status: DC | PRN

## 2022-04-17 MED ORDER — TRAMADOL HCL 50 MG PO TABS: 50.0000 mg | ORAL_TABLET | Freq: Four times a day (QID) | ORAL | 0 refills | Status: AC | PRN

## 2022-04-17 MED ORDER — LIDOCAINE 5 % EX PTCH: 1.0000 | MEDICATED_PATCH | CUTANEOUS | 0 refills | Status: DC

## 2022-04-17 MED ORDER — GABAPENTIN 300 MG PO CAPS: 600.0000 mg | ORAL_CAPSULE | Freq: Three times a day (TID) | ORAL | 0 refills | Status: DC

## 2022-04-17 MED ORDER — ACETAMINOPHEN 500 MG PO TABS: 500.0000 mg | ORAL_TABLET | Freq: Four times a day (QID) | ORAL | 0 refills | Status: DC | PRN
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
## 2022-04-17 NOTE — ED Provider Triage Note (Signed)
Emergency Medicine Provider Triage Evaluation Note  Doua Condello , a 62 y.o. male  was evaluated in triage.  Pt complains of right leg pain. History of same, states that it had gotten better until 2 days ago when he fell and broke his collar bone. He states his pain is throughout his whole leg. Denies back pain. States he has been seen several times for this previously, he is unsure if he has a diagnosis or what they have done for it in the past. He is ambulatory.  Review of Systems  Positive:  Negative:   Physical Exam  Pulse 95  Gen:   Awake, no distress   Resp:  Normal effort  MSK:   Moves extremities without difficulty Other:  DP and PT pulses 2+. TTP throughout the right leg. No erythema or warmth. Ambulatory  Medical Decision Making  Medically screening exam initiated at 1:18 AM.  Appropriate orders placed.  Seeley Letter was informed that the remainder of the evaluation will be completed by another provider, this initial triage assessment does not replace that evaluation, and the importance of remaining in the ED until their evaluation is complete.  Of note, labs ordered including CBC with diff, ethanol, and CMP ordered at [facility] were performed on incorrect patient which was not recognized until labs resulted. Called lab who states will have to wait until morning for these to be removed.   Work up initiated.     Bud Face, PA-C 04/17/22 [phone]

## 2022-04-17 NOTE — ED Notes (Signed)
Charting on this patient is incorrect.  All charting by Jakaila Norment B RN on this patient was done in error.

## 2022-04-17 NOTE — Telephone Encounter (Signed)
Noted. patient has appointment for 04/18/2022.

## 2022-04-17 NOTE — Telephone Encounter (Signed)
Needs work in appt with orthopedics asap fractured left clavicle s/p fall this weekend

## 2022-04-17 NOTE — Discharge Instructions (Addendum)
You came to the emergency department today with pain to your right leg.  This is a chronic problem that needs closer evaluation by orthopedics/podiatry and your PCP.  Please make an appointment outpatient.  You may use Tylenol and ibuprofen for your discomfort.  I am also refilling the gabapentin that is used for nerve pain.  It was a pleasure to meet you and we hope you feel better.  Please make a follow-up appointment outpatient.

## 2022-04-17 NOTE — ED Triage Notes (Signed)
Pt had fall on Sunday fracturing collar bone.  Seen at urgent care and given a sling.  Pt states he had previously had trouble with his right leg and foot but had been doing much better until the fall on Sunday. No has burning and pain to right let and bottom of right foot

## 2022-04-17 NOTE — ED Provider Notes (Signed)
[facility] Provider Note   CSN: [id number] Arrival date & time: 04/16/22  2351     History  Chief Complaint  Patient presents with   Leg Pain    Jeremiah Hunter is a 62 y.o. male presenting with leg pain.  He reports for years he has had bilateral lower extremity pain and cramping however over the past couple of months it is worsened.  Over the past couple of weeks to a month his right lower extremity has worsened.  Describes it as cramping and pressure.  He believes it is shooting up from his foot to his hip.  No recent travel or surgery, no tobacco use, no history of DVT.  No history of diabetes or diagnosed neuropathy   Leg Pain      Home Medications Prior to Admission medications   Medication Sig Start Date End Date Taking? Authorizing Provider  acetaminophen (TYLENOL) 500 MG tablet Take 1 tablet (500 mg total) by mouth every 6 (six) hours as needed. 04/15/22   Teodora Medici, FNP  amLODipine (NORVASC) 5 MG tablet Take 1 tablet (5 mg total) by mouth daily. TAKE 1 TABLET(5 MG) BY MOUTH DAILY 03/01/22   [name], MD  cyclobenzaprine (FLEXERIL) 5 MG tablet Take 1 tablet (5 mg total) by mouth at bedtime. Patient not taking: Reported on 04/05/2022 03/30/22   Rising, [name], PA-C  divalproex (DEPAKOTE ER) 500 MG 24 hr tablet Take 1 tablet (500 mg total) by mouth in the morning and at bedtime. 03/01/22   [name], MD  fluticasone (FLONASE) 50 MCG/ACT nasal spray Place 2 sprays into both nostrils daily. 03/01/22   [name], MD  gabapentin (NEURONTIN) 300 MG capsule Take 2 capsules (600 mg total) by mouth 3 (three) times daily. 03/01/22   [name], MD  ondansetron (ZOFRAN) 4 MG tablet Take 1 tablet (4 mg total) by mouth every 8 (eight) hours as needed for nausea or vomiting. 04/04/22   [name], PA-C  OXYTROL 3.9 MG/24HR Apply patch two times a week 03/01/22   [name], MD  Vitamin D, Ergocalciferol,  (DRISDOL) 1.25 MG (50000 UNIT) CAPS capsule Take 1 capsule (50,000 Units total) by mouth every Sunday. 03/04/22   [name], MD      Allergies    Patient has no known allergies.    Review of Systems   Review of Systems  Physical Exam Updated Vital Signs BP (!) 153/96   Pulse 71   Temp 98.2 F (36.8 C) (Oral)   Resp 16   SpO2 100%  Physical Exam Vitals and nursing note reviewed.  Constitutional:      Appearance: Normal appearance.  HENT:     Head: Normocephalic and atraumatic.  Eyes:     General: No scleral icterus.    Conjunctiva/sclera: Conjunctivae normal.  Pulmonary:     Effort: Pulmonary effort is normal. No respiratory distress.  Musculoskeletal:     Comments: Patient is ambulatory.  Strong DP pulses bilaterally.  No signs of cellulitis, no swelling.  Lumbar spine slightly TTP midline  Skin:    Findings: No rash.  Neurological:     Mental Status: He is alert.  Psychiatric:        Mood and Affect: Mood normal.     ED Results / Procedures / Treatments   Labs (all labs ordered are listed, but only abnormal results are displayed) Labs Reviewed  COMPREHENSIVE METABOLIC PANEL - Abnormal;  Notable for the following components:      Result Value   Sodium 132 (*)    Glucose, Bld 163 (*)    Creatinine, Ser 1.28 (*)    Calcium 8.1 (*)    Albumin 2.8 (*)    AST 55 (*)    All other components within normal limits  ETHANOL - Abnormal; Notable for the following components:   Alcohol, Ethyl (B) 286 (*)    All other components within normal limits  CBC WITH DIFFERENTIAL/PLATELET - Abnormal; Notable for the following components:   Hemoglobin 11.8 (*)    HCT 35.8 (*)    All other components within normal limits  CBC - Abnormal; Notable for the following components:   Hemoglobin 12.5 (*)    HCT 38.5 (*)    RDW 15.7 (*)    All other components within normal limits  BASIC METABOLIC PANEL - Abnormal; Notable for the following components:   Potassium 3.3 (*)     Glucose, Bld 137 (*)    Calcium 8.7 (*)    All other components within normal limits    EKG None  Radiology DG Clavicle Left  Result Date: 04/15/2022 CLINICAL DATA:  Clavicular fracture EXAM: LEFT CLAVICLE - 2 VIEWS COMPARISON:  None Available. FINDINGS: Distal left clavicular comminuted fracture observed with 1.5 cm displacement of the distal fragment. IMPRESSION: Fracture left clavicle. Electronically Signed   By: Sammie Bench M.D.   On: 04/15/2022 13:19   DG Shoulder Left  Result Date: 04/15/2022 CLINICAL DATA:  Fall, pain. EXAM: LEFT SHOULDER - 2 VIEW; LEFT HUMERUS - 2 VIEW COMPARISON:  11/17/2021. FINDINGS: Minimally displaced fracture distal left clavicle. No glenohumeral subluxation or dislocation. Osseous structures appear otherwise intact. IMPRESSION: Distal left clavicular fracture Electronically Signed   By: Sammie Bench M.D.   On: 04/15/2022 12:20   DG Humerus Left  Result Date: 04/15/2022 CLINICAL DATA:  Fall, pain. EXAM: LEFT SHOULDER - 2 VIEW; LEFT HUMERUS - 2 VIEW COMPARISON:  11/17/2021. FINDINGS: Minimally displaced fracture distal left clavicle. No glenohumeral subluxation or dislocation. Osseous structures appear otherwise intact. IMPRESSION: Distal left clavicular fracture Electronically Signed   By: Sammie Bench M.D.   On: 04/15/2022 12:20    Procedures Procedures   Medications Ordered in ED Medications  HYDROcodone-acetaminophen (NORCO/VICODIN) 5-325 MG per tablet 1 tablet (has no administration in time range)    ED Course/ Medical Decision Making/ A&P                             Medical Decision Making Amount and/or Complexity of Data Reviewed Radiology: ordered.  Risk OTC drugs. Prescription drug management.   This is a 62 year old male presenting today with leg pain.  Differential includes but is not limited to DVT, PAD, cellulitis, venous stasis, sciatica, neuropathy.  This is not an exhaustive differential.    Past Medical History /  Co-morbidities / Social History: Multiple visits for leg cramping and lower extremity pain.   Additional history: Per chart review patient had a recent fall and was seen at urgent care and diagnosed with a clavicular fracture.  He is currently in a sling.  Patient has been seen by PCP numerous times for leg discomfort.  He had ABIs last year for claudication that were within normal limits.   Physical Exam: Pertinent physical exam findings include No appreciable swelling.  No cellulitis. Pain worsens with palpation of the lumbar spine  Lab Tests:  There is  lab work in the chart that does not belong to this patient.  The most recent BMP and CBC are his true labs.  CMP with elevated liver function tests and AKI as well as ethanol and CBC with 11.8 hemoglobin are all a different patient's labs.   Imaging Studies: DVT scan ordered in triage however I do not believe he needs this.  Lumbar spine x-ray ordered, viewed and interpreted by me and I agree that there are some degeneration however I do not believe they are severe enough to be causing his symptoms today.  Medications: I ordered medication including Vicodin. Reevaluation of the patient after these medicines showed that the patient improved. I have reviewed the patients home medicines and have made adjustments as needed.     MDM/Disposition: This is a 62 year old male presenting today with leg pain.  He reports has been going on for years but it has been worsening over the past few months.  No risk factors for DVT so vascular study was canceled.  On my exam patient is neurovascularly intact.  He did have some lumbar spinal tenderness so x-ray was ordered.  These are negative.  Ultimately, patient does not appear to have an emergent condition requiring any further evaluation today.  Per chart review patient was seen by podiatry for this same complaint in January and diagnosed with neuropathy of lower extremities.  He also was seen in the  urgent care setting 2 weeks ago for this pain.     Final Clinical Impression(s) / ED Diagnoses Final diagnoses:  Right leg pain    Rx / DC Orders ED Discharge Orders          Ordered    gabapentin (NEURONTIN) 300 MG capsule  3 times daily        04/17/22 0348    acetaminophen (TYLENOL) 500 MG tablet  Every 6 hours PRN        04/17/22 0348    ibuprofen (ADVIL) 600 MG tablet  Every 6 hours PRN        04/17/22 0348    lidocaine (LIDODERM) 5 %  Every 24 hours        04/17/22 0348           Results and diagnoses were explained to the patient. Return precautions discussed in full. Patient had no additional questions and expressed complete understanding.   This chart was dictated using voice recognition software.  Despite best efforts to proofread,  errors can occur which can change the documentation meaning.    Darliss Ridgel 04/17/22 0452    Quintella Reichert, MD 04/17/22 0730

## 2022-04-18 ENCOUNTER — Telehealth: Payer: Self-pay | Admitting: Critical Care Medicine

## 2022-04-18 ENCOUNTER — Encounter: Payer: Self-pay | Admitting: Critical Care Medicine

## 2022-04-18 ENCOUNTER — Ambulatory Visit: Payer: Medicaid Other | Attending: Critical Care Medicine | Admitting: Critical Care Medicine

## 2022-04-18 VITALS — BP 128/78 | HR 102 | Temp 98.2°F | Ht 71.0 in | Wt 199.0 lb

## 2022-04-18 DIAGNOSIS — Z760 Encounter for issue of repeat prescription: Secondary | ICD-10-CM | POA: Insufficient documentation

## 2022-04-18 DIAGNOSIS — S42032A Displaced fracture of lateral end of left clavicle, initial encounter for closed fracture: Secondary | ICD-10-CM | POA: Diagnosis not present

## 2022-04-18 DIAGNOSIS — Z91048 Other nonmedicinal substance allergy status: Secondary | ICD-10-CM

## 2022-04-18 DIAGNOSIS — X58XXXA Exposure to other specified factors, initial encounter: Secondary | ICD-10-CM | POA: Diagnosis not present

## 2022-04-18 NOTE — Patient Instructions (Addendum)
Referral to allergy made  Keep orthopedic appointment  We adjusted you medication  Keep existing appt with Dr Joya Gaskins in return July 9th

## 2022-04-18 NOTE — Assessment & Plan Note (Signed)
Referral to allergy for skin testing
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
## 2022-04-18 NOTE — Telephone Encounter (Signed)
Copied from [facility] [id number]. Topic: General - Other >> Apr 18, 2022 12:49 PM Jacinto Reap M wrote: Reason for CRM: Pt requests that Dr. Joya Gaskins submits an order for him to get the Covid booster shot at [facility]

## 2022-04-18 NOTE — Assessment & Plan Note (Signed)
Continue sling and pain management and referral to orthopedics was made

## 2022-04-19 NOTE — Telephone Encounter (Signed)
No he does not need a rx

## 2022-04-19 NOTE — Telephone Encounter (Signed)
Called patient and let him know that he was able to get a covid shot without a script but he  stated that before when he tried getting the cpvid shot they stated that he  needed a script

## 2022-04-24 ENCOUNTER — Other Ambulatory Visit: Payer: Self-pay

## 2022-04-24 ENCOUNTER — Ambulatory Visit (INDEPENDENT_AMBULATORY_CARE_PROVIDER_SITE_OTHER): Payer: Self-pay | Admitting: Physician Assistant

## 2022-04-24 DIAGNOSIS — S42032A Displaced fracture of lateral end of left clavicle, initial encounter for closed fracture: Secondary | ICD-10-CM

## 2022-04-24 NOTE — Telephone Encounter (Signed)
Called patient and he is aware. 
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
## 2022-04-26 ENCOUNTER — Encounter: Payer: Medicaid Other | Admitting: Gastroenterology

## 2022-04-27 ENCOUNTER — Encounter: Payer: Self-pay | Admitting: Critical Care Medicine

## 2022-04-27 DIAGNOSIS — R339 Retention of urine, unspecified: Secondary | ICD-10-CM

## 2022-04-27 DIAGNOSIS — N3281 Overactive bladder: Secondary | ICD-10-CM

## 2022-05-03 ENCOUNTER — Encounter: Payer: Self-pay | Admitting: Critical Care Medicine

## 2022-05-03 ENCOUNTER — Ambulatory Visit: Payer: Self-pay | Admitting: *Deleted

## 2022-05-03 NOTE — Telephone Encounter (Signed)
Noted  

## 2022-05-03 NOTE — Telephone Encounter (Signed)
Summary: feeling weak   Patient stated he kept the lidocaine (LIDODERM) 5 % patch on 24 hours and he was feeling weak and around the area is red. Please advise

## 2022-05-03 NOTE — Telephone Encounter (Signed)
  Chief Complaint: small area of irritation at edge of patch- left on too long- he fell asleep with patch on Symptoms: patch states he has small red area at outside edge of patch Frequency: once Pertinent Negatives: Patient denies pain, itching Disposition: [] ED /[] Urgent Care (no appt availability in office) / [] Appointment(In office/virtual)/ []  [facility]/ [x] Home Care/ [] Refused Recommended Disposition /[] [facility]/ []  Follow-up with PCP Additional Notes: Patient advised cleaning and treatment of irritated area- call back if continues to occur. Move patch to different area-do not cover irritation with patch.   Reason for Disposition  Medicine patch causing local rash or itching  Answer Assessment - Initial Assessment Questions 1. NAME of MEDICINE: "What medicine(s) are you calling about?"     Red area around edge of patch- neck area- not whole area of patch 2. QUESTION: "What is your question?" (e.g., double dose of medicine, side effect)     Patient removed patch this morning- he left on 24 hours instead of the 12 hours- has some redness where the outside of the patch was on the neck. 3. PRESCRIBER: "Who prescribed the medicine?" Reason: if prescribed by specialist, call should be referred to that group.     PCP 4. SYMPTOMS: "Do you have any symptoms?" If Yes, ask: "What symptoms are you having?"  "How bad are the symptoms (e.g., mild, moderate, severe)     Skin irritation from patch- outer edge Patient advised : try to remember to remove patch after 12 hours, do not cover irritated area with another patch, may treat irritation with antibiotic ointment if needed, contact provider if this continues.  Protocols used: Medication Question Call-A-AH
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
## 2022-05-15 ENCOUNTER — Other Ambulatory Visit (INDEPENDENT_AMBULATORY_CARE_PROVIDER_SITE_OTHER): Payer: Medicaid Other

## 2022-05-15 ENCOUNTER — Ambulatory Visit (INDEPENDENT_AMBULATORY_CARE_PROVIDER_SITE_OTHER): Payer: Medicaid Other | Admitting: Orthopaedic Surgery

## 2022-05-15 DIAGNOSIS — S42032A Displaced fracture of lateral end of left clavicle, initial encounter for closed fracture: Secondary | ICD-10-CM | POA: Diagnosis not present

## 2022-05-16 ENCOUNTER — Encounter: Payer: Medicaid Other | Admitting: Gastroenterology

## 2022-05-23 ENCOUNTER — Encounter: Payer: Self-pay | Admitting: Allergy

## 2022-05-23 ENCOUNTER — Ambulatory Visit (INDEPENDENT_AMBULATORY_CARE_PROVIDER_SITE_OTHER): Payer: Medicaid Other | Admitting: Allergy

## 2022-05-23 ENCOUNTER — Other Ambulatory Visit: Payer: Self-pay

## 2022-05-23 VITALS — BP 128/76 | HR 84 | Temp 98.7°F | Resp 17 | Ht 71.0 in | Wt 199.8 lb

## 2022-05-23 DIAGNOSIS — H1013 Acute atopic conjunctivitis, bilateral: Secondary | ICD-10-CM

## 2022-05-23 DIAGNOSIS — J3089 Other allergic rhinitis: Secondary | ICD-10-CM
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
## 2022-05-23 NOTE — Patient Instructions (Addendum)
-   Testing today showed: grasses, ragweed, weeds, trees, indoor molds, outdoor molds, dust mites, and cockroach. - Copy of test results provided.  - Avoidance measures provided. - Start taking: Xyzal (levocetirizine) 5mg  tablet once daily.   This is an antihistamine.  Dymista (fluticasone/azelastine) two sprays per nostril 1-2 times daily as needed for runny or stuffy nose.   Cromolyn eye drop 1 drop each eye daily as needed for itchy/watery eyes.  - You can use an extra dose of the antihistamine, if needed, for breakthrough symptoms.  - Consider nasal saline rinses 1-2 times daily to remove allergens from the nasal cavities as well as help with mucous clearance (this is especially helpful to do before the nasal sprays are given) - Consider allergy shots as a means of long-term control. - Allergy shots "re-train" and "reset" the immune system to ignore environmental allergens and decrease the resulting immune response to those allergens (sneezing, itchy watery eyes, runny nose, nasal congestion, etc).    - Allergy shots improve symptoms in 75-85% of patients.  - We can discuss more at the next appointment if the medications are not working for you.  Follow-up in 4-6 months or sooner if needed.

## 2022-05-24 ENCOUNTER — Ambulatory Visit: Payer: Self-pay

## 2022-05-24 ENCOUNTER — Encounter: Payer: Self-pay | Admitting: Allergy

## 2022-05-24 MED ORDER — AZELASTINE-FLUTICASONE 137-50 MCG/ACT NA SUSP: 2.0000 | Freq: Every day | NASAL | 3 refills | Status: DC

## 2022-05-24 MED ORDER — LEVOCETIRIZINE DIHYDROCHLORIDE 5 MG PO TABS: 5.0000 mg | ORAL_TABLET | Freq: Every evening | ORAL | 5 refills | Status: DC

## 2022-05-24 MED ORDER — CROMOLYN SODIUM 4 % OP SOLN: 1.0000 [drp] | Freq: Every day | OPHTHALMIC | 5 refills | Status: DC

## 2022-05-24 NOTE — Telephone Encounter (Signed)
Pt called reporting that he saw a specialist yesterday regarding his mold exposure, he is requesting a call back to discuss the medication he was asked to take. He is concerned   Chief Complaint: Dr. Delorse Lek "has not sent in my new medications." Symptoms: n/a Frequency: n/a Pertinent Negatives: Patient denies n/a Disposition: [] ED /[] Urgent Care (no appt availability in office) / [] Appointment(In office/virtual)/ []  [facility]/ [] Home Care/ [] Refused Recommended Disposition /[] [facility]/ []  Follow-up with PCP Additional Notes: Instructed to call Dr. Randell Patient office this morning. Verbalizes understanding.  Answer Assessment - Initial Assessment Questions 1. NAME of MEDICINE: "What medicine(s) are you calling about?"     Xyzal, Dymista, Cromolyn drops 2. QUESTION: "What is your question?" (e.g., double dose of medicine, side effect)     His allergy doctor has not called in the new medications 3. PRESCRIBER: "Who prescribed the medicine?" Reason: if prescribed by specialist, call should be referred to that group.     Allergist 4. SYMPTOMS: "Do you have any symptoms?" If Yes, ask: "What symptoms are you having?"  "How bad are the symptoms (e.g., mild, moderate, severe)     N/a 5. PREGNANCY:  "Is there any chance that you are pregnant?" "When was your last menstrual period?"     N/a  Protocols used: Medication Question Call-A-AH

## 2022-05-29 ENCOUNTER — Encounter: Payer: Medicaid Other | Admitting: Gastroenterology

## 2022-06-05 ENCOUNTER — Other Ambulatory Visit: Payer: Self-pay

## 2022-06-05 ENCOUNTER — Ambulatory Visit: Payer: Medicaid Other | Attending: Critical Care Medicine

## 2022-06-05 DIAGNOSIS — M25512 Pain in left shoulder: Secondary | ICD-10-CM

## 2022-06-05 DIAGNOSIS — S42032A Displaced fracture of lateral end of left clavicle, initial encounter for closed fracture: Secondary | ICD-10-CM | POA: Diagnosis not present

## 2022-06-05 DIAGNOSIS — M6281 Muscle weakness (generalized): Secondary | ICD-10-CM | POA: Diagnosis present
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
## 2022-06-06 ENCOUNTER — Encounter: Payer: Self-pay | Admitting: Allergy

## 2022-06-06 NOTE — Telephone Encounter (Signed)
Spoke to patient on the phone and he states that he thinks that the headaches that he has been having are due to mold exposure. He did not want to set up an appointment to discuss his mychart message. Patient wanted advice from Dr. Delorse Lek on what she thinks could be done to treat the exposure and headaches. He states that he did some reading about mold and that people have different symptoms from continuous mold exposure.

## 2022-06-12 ENCOUNTER — Encounter: Payer: Medicaid Other | Admitting: Gastroenterology

## 2022-06-12 ENCOUNTER — Ambulatory Visit: Payer: Medicaid Other | Admitting: Orthopaedic Surgery

## 2022-06-25 ENCOUNTER — Other Ambulatory Visit: Payer: Self-pay

## 2022-06-25 ENCOUNTER — Emergency Department (HOSPITAL_COMMUNITY)
Admission: EM | Admit: 2022-06-25 | Discharge: 2022-06-26 | Disposition: A | Discharge status: Home / Self Care | Payer: Medicaid Other | Attending: Emergency Medicine | Admitting: Emergency Medicine

## 2022-06-25 ENCOUNTER — Encounter (HOSPITAL_COMMUNITY): Payer: Self-pay

## 2022-06-25 DIAGNOSIS — R440 Auditory hallucinations: Secondary | ICD-10-CM | POA: Diagnosis not present

## 2022-06-25 DIAGNOSIS — Z79899 Other long term (current) drug therapy: Secondary | ICD-10-CM | POA: Insufficient documentation

## 2022-06-25 DIAGNOSIS — I10 Essential (primary) hypertension: Secondary | ICD-10-CM | POA: Insufficient documentation

## 2022-06-25 DIAGNOSIS — F339 Major depressive disorder, recurrent, unspecified: Secondary | ICD-10-CM | POA: Insufficient documentation

## 2022-06-25 DIAGNOSIS — F259 Schizoaffective disorder, unspecified: Secondary | ICD-10-CM

## 2022-06-25 DIAGNOSIS — R45850 Homicidal ideations: Secondary | ICD-10-CM | POA: Insufficient documentation

## 2022-06-25 DIAGNOSIS — R030 Elevated blood-pressure reading, without diagnosis of hypertension: Secondary | ICD-10-CM

## 2022-06-25 DIAGNOSIS — F250 Schizoaffective disorder, bipolar type: Secondary | ICD-10-CM | POA: Insufficient documentation

## 2022-06-25 DIAGNOSIS — R944 Abnormal results of kidney function studies: Secondary | ICD-10-CM | POA: Insufficient documentation

## 2022-06-25 DIAGNOSIS — F419 Anxiety disorder, unspecified: Secondary | ICD-10-CM

## 2022-06-25 DIAGNOSIS — F3132 Bipolar disorder, current episode depressed, moderate: Secondary | ICD-10-CM | POA: Diagnosis not present

## 2022-06-25 LAB — RAPID URINE DRUG SCREEN, HOSP PERFORMED
Amphetamines: NOT DETECTED
Barbiturates: NOT DETECTED
Benzodiazepines: NOT DETECTED
Cocaine: NOT DETECTED
Opiates: NOT DETECTED
Tetrahydrocannabinol: NOT DETECTED

## 2022-06-25 LAB — CBC
HCT: 43.6 % (ref 39.0–52.0)
Hemoglobin: 14.6 g/dL (ref 13.0–17.0)
MCH: 28.5 pg (ref 26.0–34.0)
MCHC: 33.5 g/dL (ref 30.0–36.0)
MCV: 85.2 fL (ref 80.0–100.0)
Platelets: 213 10*3/uL (ref 150–400)
RBC: 5.12 MIL/uL (ref 4.22–5.81)
RDW: 16.6 % — ABNORMAL HIGH (ref 11.5–15.5)
WBC: 6.5 10*3/uL (ref 4.0–10.5)
nRBC: 0 % (ref 0.0–0.2)

## 2022-06-25 LAB — COMPREHENSIVE METABOLIC PANEL
ALT: 19 U/L (ref 0–44)
AST: 22 U/L (ref 15–41)
Albumin: 4 g/dL (ref 3.5–5.0)
Alkaline Phosphatase: 112 U/L (ref 38–126)
Anion gap: 10 (ref 5–15)
BUN: 17 mg/dL (ref 6–20)
CO2: 25 mmol/L (ref 22–32)
Calcium: 9.1 mg/dL (ref 8.9–10.3)
Chloride: 104 mmol/L (ref 98–111)
Creatinine, Ser: 1.25 mg/dL — ABNORMAL HIGH (ref 0.61–1.24)
GFR, Estimated: >60 mL/min (ref >60)
Glucose, Bld: 105 mg/dL — ABNORMAL HIGH (ref 70–99)
Potassium: 3.8 mmol/L (ref 3.5–5.1)
Sodium: 139 mmol/L (ref 135–145)
Total Bilirubin: 0.8 mg/dL (ref 0.3–1.2)
Total Protein: 7.3 g/dL (ref 6.5–8.1)

## 2022-06-25 LAB — SALICYLATE LEVEL: Salicylate Lvl: <7 mg/dL — ABNORMAL LOW (ref 7.0–30.0)

## 2022-06-25 LAB — ACETAMINOPHEN LEVEL: Acetaminophen (Tylenol), Serum: <10 ug/mL — ABNORMAL LOW (ref 10–30)

## 2022-06-25 LAB — ETHANOL: Alcohol, Ethyl (B): <10 mg/dL (ref <10)
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
## 2022-06-25 NOTE — BH Assessment (Signed)
TTS clinician attempted to complete TTS assessment. Patient refused stating "I can't think, please please leave me alone, not right now, nothing to say right now". Monique, RN, tried to get patient to speak with TTS clinician, patient continuous to refuse to complete assessment at this time.

## 2022-06-26 ENCOUNTER — Encounter (HOSPITAL_COMMUNITY): Payer: Self-pay

## 2022-06-26 ENCOUNTER — Other Ambulatory Visit: Payer: Self-pay

## 2022-06-26 ENCOUNTER — Emergency Department (EMERGENCY_DEPARTMENT_HOSPITAL)
Admission: EM | Admit: 2022-06-26 | Discharge: 2022-06-27 | Disposition: A | Discharge status: Other Acute Inpatient Hospital | Payer: Medicaid Other | Source: Home / Self Care | Attending: Emergency Medicine | Admitting: Emergency Medicine

## 2022-06-26 ENCOUNTER — Encounter (HOSPITAL_COMMUNITY): Payer: Self-pay | Admitting: Psychiatry

## 2022-06-26 ENCOUNTER — Ambulatory Visit: Payer: Medicaid Other

## 2022-06-26 DIAGNOSIS — F3132 Bipolar disorder, current episode depressed, moderate: Secondary | ICD-10-CM | POA: Insufficient documentation

## 2022-06-26 DIAGNOSIS — R45850 Homicidal ideations: Secondary | ICD-10-CM

## 2022-06-26 DIAGNOSIS — I10 Essential (primary) hypertension: Secondary | ICD-10-CM | POA: Insufficient documentation

## 2022-06-26 DIAGNOSIS — R440 Auditory hallucinations: Secondary | ICD-10-CM

## 2022-06-26 HISTORY — DX: Homicidal ideations: R45.850

## 2022-06-26 LAB — RAPID URINE DRUG SCREEN, HOSP PERFORMED
Amphetamines: NOT DETECTED
Barbiturates: NOT DETECTED
Benzodiazepines: NOT DETECTED
Cocaine: NOT DETECTED
Opiates: NOT DETECTED
Tetrahydrocannabinol: NOT DETECTED

## 2022-06-26 LAB — COMPREHENSIVE METABOLIC PANEL
ALT: 15 U/L (ref 0–44)
AST: 22 U/L (ref 15–41)
Albumin: 4 g/dL (ref 3.5–5.0)
Alkaline Phosphatase: 112 U/L (ref 38–126)
Anion gap: 7 (ref 5–15)
BUN: 17 mg/dL (ref 6–20)
CO2: 27 mmol/L (ref 22–32)
Calcium: 9 mg/dL (ref 8.9–10.3)
Chloride: 106 mmol/L (ref 98–111)
Creatinine, Ser: 1.28 mg/dL — ABNORMAL HIGH (ref 0.61–1.24)
GFR, Estimated: >60 mL/min (ref >60)
Glucose, Bld: 102 mg/dL — ABNORMAL HIGH (ref 70–99)
Potassium: 3.8 mmol/L (ref 3.5–5.1)
Sodium: 140 mmol/L (ref 135–145)
Total Bilirubin: 0.3 mg/dL (ref 0.3–1.2)
Total Protein: 7.4 g/dL (ref 6.5–8.1)

## 2022-06-26 LAB — SALICYLATE LEVEL: Salicylate Lvl: <7 mg/dL — ABNORMAL LOW (ref 7.0–30.0)

## 2022-06-26 LAB — CBC
HCT: 43.5 % (ref 39.0–52.0)
Hemoglobin: 14.5 g/dL (ref 13.0–17.0)
MCH: 28.4 pg (ref 26.0–34.0)
MCHC: 33.3 g/dL (ref 30.0–36.0)
MCV: 85.3 fL (ref 80.0–100.0)
Platelets: 210 10*3/uL (ref 150–400)
RBC: 5.1 MIL/uL (ref 4.22–5.81)
RDW: 16.5 % — ABNORMAL HIGH (ref 11.5–15.5)
WBC: 3.9 10*3/uL — ABNORMAL LOW (ref 4.0–10.5)
nRBC: 0 % (ref 0.0–0.2)

## 2022-06-26 LAB — ETHANOL: Alcohol, Ethyl (B): <10 mg/dL (ref <10)

## 2022-06-26 LAB — ACETAMINOPHEN LEVEL: Acetaminophen (Tylenol), Serum: <10 ug/mL — ABNORMAL LOW (ref 10–30)

## 2022-06-26 MED ORDER — DIVALPROEX SODIUM ER 500 MG PO TB24: 500.0000 mg | ORAL_TABLET | Freq: Two times a day (BID) | ORAL | Status: DC

## 2022-06-26 MED ORDER — CYCLOBENZAPRINE HCL 10 MG PO TABS
5.0000 mg | ORAL_TABLET | Freq: Every day | ORAL | Status: DC
  Administered 2022-06-26: 5 mg via ORAL
  Filled 2022-06-26: qty 1

## 2022-06-26 MED ORDER — AZELASTINE HCL 0.1 % NA SOLN
1.0000 | Freq: Every day | NASAL | Status: DC
  Administered 2022-06-26: 1 via NASAL
  Filled 2022-06-26 (×2): qty 30

## 2022-06-26 MED ORDER — FLUTICASONE PROPIONATE 50 MCG/ACT NA SUSP
1.0000 | Freq: Every day | NASAL | Status: DC
  Administered 2022-06-26: 1 via NASAL
  Filled 2022-06-26: qty 16

## 2022-06-26 MED ORDER — AZELASTINE-FLUTICASONE 137-50 MCG/ACT NA SUSP: 2.0000 | Freq: Every day | NASAL | Status: DC

## 2022-06-26 MED ORDER — GABAPENTIN 300 MG PO CAPS: 600.0000 mg | ORAL_CAPSULE | Freq: Three times a day (TID) | ORAL | Status: DC | PRN

## 2022-06-26 MED ORDER — GABAPENTIN 300 MG PO CAPS
600.0000 mg | ORAL_CAPSULE | Freq: Three times a day (TID) | ORAL | Status: DC
  Administered 2022-06-26: 600 mg via ORAL
  Filled 2022-06-26: qty 2

## 2022-06-26 MED ORDER — DIVALPROEX SODIUM ER 500 MG PO TB24
500.0000 mg | ORAL_TABLET | Freq: Every day | ORAL | Status: DC
  Filled 2022-06-26: qty 1

## 2022-06-26 MED ORDER — AMLODIPINE BESYLATE 5 MG PO TABS
5.0000 mg | ORAL_TABLET | Freq: Every day | ORAL | Status: DC
  Filled 2022-06-26: qty 1

## 2022-06-26 MED ORDER — ONDANSETRON HCL 4 MG PO TABS: 4.0000 mg | ORAL_TABLET | Freq: Three times a day (TID) | ORAL | Status: DC | PRN

## 2022-06-26 MED ORDER — CROMOLYN SODIUM 4 % OP SOLN
1.0000 [drp] | Freq: Every day | OPHTHALMIC | Status: DC
  Administered 2022-06-26: 1 [drp] via OPHTHALMIC
  Filled 2022-06-26 (×2): qty 10

## 2022-06-26 MED ORDER — AMLODIPINE BESYLATE 5 MG PO TABS
5.0000 mg | ORAL_TABLET | Freq: Every day | ORAL | Status: DC
  Administered 2022-06-26: 5 mg via ORAL
  Filled 2022-06-26: qty 1

## 2022-06-26 MED ORDER — LORATADINE 10 MG PO TABS
10.0000 mg | ORAL_TABLET | Freq: Every day | ORAL | Status: DC
  Administered 2022-06-26: 10 mg via ORAL
  Filled 2022-06-26: qty 1

## 2022-06-26 MED ORDER — ACETAMINOPHEN 325 MG PO TABS: 650.0000 mg | ORAL_TABLET | ORAL | Status: DC | PRN

## 2022-06-26 NOTE — ED Notes (Signed)
Urine sent to lab 

## 2022-06-26 NOTE — ED Provider Notes (Addendum)
Emergency Medicine Observation Re-evaluation Note  Jeremiah Hunter is a 62 y.o. male, seen on rounds today.  Pt initially presented to the ED for complaints of mental health evaluation.  Pt currently calm and cooperative. Reports normal appetite.  No new c/o this AM.   Physical Exam  BP (!) 126/103 (BP Location: Right Arm)   Pulse 84   Temp 99 F (37.2 C) (Oral)   Resp 16   Ht 1.803 m (5\' 11" )   Wt 90.6 kg   SpO2 98%   BMI 27.86 kg/m  Physical Exam General: alert, content.  Cardiac: regular rate.  Lungs: breathing comfortably. Psych: calm, conversant. Pt seems to move from talking about one unrelated topic to another. Denies thoughts of harm to self or others. Does not appear acutely depressed. Pt does not appear to be actively responding to internal stimuli, no hallucinations noted.   ED Course / MDM   I have reviewed the labs performed to date as well as medications administered while in observation.  Recent changes in the last 24 hours include ED obs, reassessment.   Plan  Current plan is for [facility] team to reassess.   Charts reviewed  - pts currently presentation/exam appears c/w what is noted in prior [facility] note from 01/04/2022. It appears pts behavioral health issues may be more of the chronic as opposed to acute nature, possibly more amenable to med adjustment and close outpatient f/u (as opposed to benefiting from acute inpatient psychiatric stay).  BH team to reassess this AM.   Pt has been medically cleared and is awaiting dispo by [facility] team.    Cathren Laine, MD 06/26/22 [id number]  [facility] team has assessed and indicates pt is psych clear for d/c. His community act will come to ED to pick him up, and pt has outpatient f/u.  Pt currently appears stable for d/c per bH plan.       Cathren Laine, MD 06/26/22 1121

## 2022-06-26 NOTE — ED Notes (Signed)
Pt stated that he is not able to urinate at this time.  Will check in 30 minutes

## 2022-06-26 NOTE — Progress Notes (Signed)
Per Earney Navy, NP-PMHNP-BC pt meets inpatient behavioral health placement. CSW requested Night [facility] Fransico Michael, RN to review.    Maryjean Ka, MSW, LCSWA 06/26/2022 10:06 PM
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
## 2022-06-26 NOTE — ED Notes (Signed)
Patient handed this RN papers that he wants me to "check" out; upon reading the pages nothing makes since; It seems to be a bunch of flight of ideas written down; for example : "wet clothe at chow time (breakfast chow) (lunch chow) (dinner chow) Hands must be clean are they will kill me....."Mr. [place] is dead but in 3 days Mr. Lindemann shall rise...please do not bother Mr. Stahlberg I will not kill my life..." Pages has been labeled and placed in medical records-Monique,RN

## 2022-06-26 NOTE — Discharge Instructions (Addendum)
Outpatient psychiatric Services  Walk in hours for medication management Monday, Wednesday, Thursday, and Friday from 8:00 AM to 11:00 AM Recommend arriving by by 7:30 AM.  It is first come first serve.    Walk in hours for therapy intake Monday and Wednesday only 8:00 AM to 11:00 AM Encouraged to arrive by 7:30 AM.  It is first come first serve   Inpatient patient psychiatric services [facility] offers comprehensive behavioral heath care services for mental health and substance abuse treatment.  Social work can also assist with referral to or getting you into a rehabilitation program short or long term  [facility] [facility]) Monday - Friday 8am - 3pm          Sat & Sun 8am - 2pm [address] [place], [place] [address]   [phone]     www.i[email or web address] [facility] offers among other critical resources: showers, laundry, barbershop, phone bank, mailroom, computer lab, medical clinic, gardens and a bike maintenance area.   AREA SHELTERS  [facility] [employer]  (Men & women) [address] [place] [phone]  [facility] [place] (Men/women/families) [address] [place] [phone]   [facility] (Families with children) [phone]  [place] [phone]   [facility] (Domestic Violence Shelter) [address]  [place] [phone]   [facility] (Children ages 78-17) [address]  [place] [phone]   [facility]   (Women & children) [address] [place] [phone]   [facility] (Women/substance abuse) [address]  [place] [phone]   [facility] (Men) [address]  [place] [phone]   [facility] (Men) [address]  [place] [phone]  [employer] (Women) [phone] [address]  [place] [phone]   [facility] (Single women & women with  children) [address]  [place] [phone]  [facility] (Men/women/families) [address] [phone]    [facility]   (Domestic Violence shelter) [address]   [phone]   [facility] (Men & women) [address]  [place] [phone]  [facility] (Men) [address]  [employer] [phone]   [facility] (Men) [address]  [place] [phone]   [facility] (Single women & families) [address]  [place] [phone]  [facility] (Men/women & families) [address]  [place] [phone]    If you are at risk of losing your housing (throughout [facility]) call the Housing Hotline at (914) 449-3124. You may also contact 2-1-1, a FREE service of the [address] that provides information about many resources including housing. Dial 211, or visit online at PooledIncome.pl. [employer]:  [facility] of [place], contact Janelle Floor [phone] (men only)                          Huey Romans in [place]:  90 day homeless program for women and men;                             contact Rev. Chambers [phone]  [facility]:  [facility]:  men/women/children [phone]  [facility]:  Shelter in [place] [phone]                       [employer] in [place], [place] [phone]   [facility]:  [facility] for Abused Women, [phone]; (women and children)  [facility]:  [name], men/women/children; [phone]                           [facility] in [place], [phone]; substance abuse halfway house for men              [facility]; 4 bedroom house in [facility] for homeless women, contact Jenne Campus [phone]               [facility] in [place], [phone] (women and children)               [facility], for abused women and  children, 24 hour crisis line, [phone], [place]  [facility], halfway house for women, [place], [phone]  [facility]:  [facility], [phone]; open Mon-Thurs from TKZ60 - March 15 when temp is below 32 degrees                              [facility]; [place], [phone]; cell [phone]; open 24/7  [facility]:  Outreach for [place] - Rev Ladona Ridgel [phone]  [place]:  transitional housing for women and children; Arminda Resides [phone]   It was our pleasure to provide your ER care today - we hope that you feel better.  Follow up closely with primary care doctor and behavioral health provider in the coming week. Also follow up with primary care doctor regarding your blood pressure that is high today.   For mental health issues and/or crisis, you may also go directly to the Behavioral Health Urgent Care Center - they are open 24/7 and walk-ins are welcome.    Return to ER if worse, new symptoms, fevers, chest pain, trouble breathing, or other emergency concern.

## 2022-06-26 NOTE — ED Notes (Signed)
Pt given turkey sandwich and sprite.  
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
## 2022-06-26 NOTE — ED Notes (Signed)
PT statements informed to EDP. ED provider stated no change in disposition. BH NP also stated no concerns or change in disposition. Present RN spoked with Meredith Mody MD face to face.

## 2022-06-26 NOTE — ED Notes (Signed)
Patient refused TTS at this times; states he does not feel like talking; Patient is aware that a decision on care can not be made until assessment is completed; pt provided with crayon and paper per request-Monique,RN

## 2022-06-26 NOTE — ED Notes (Signed)
Psych Provider at bedside. 

## 2022-06-26 NOTE — ED Provider Notes (Signed)
[place] EMERGENCY DEPARTMENT AT [facility] Provider Note   CSN: [id number] Arrival date & time: 06/26/22  1409     History  Chief Complaint  Patient presents with   Homicidal    States homicidal ideations   Hallucinations    States he is not taking his medications until he gets some help    Jeremiah Hunter is a 62 y.o. male.  HPI Patient was seen earlier today and taken back home after evaluation for homicidal ideation.  Patient reports that he continues to have ideas and plans around killing another individual.  He reports that he and this individual have had "beef" for quite a while and he just cannot take any more of it.  Patient reports that he is afraid that he will actually do something and has also considered burning down the building.  He denies a plan to injure anybody else but reports "if someone got in my way" he might hurt them.  He reports that he has started hearing voices again.  He reports he has not had this problem for quite a while and his symptoms were controlled.  At this point he reports he is hearing his voice is at night or when he is trying to sleep and he cannot distinguish what they are saying.  He reports however he went through a pretty long period of doing well and was not having any auditory hallucinations.  He denies that he is currently having them.    Home Medications Prior to Admission medications   Medication Sig Start Date End Date Taking? Authorizing Provider  acetaminophen (TYLENOL) 500 MG tablet Take 1 tablet (500 mg total) by mouth every 6 (six) hours as needed. Patient not taking: Reported on 06/05/2022 04/17/22   Redwine, Madison A, PA-C  amLODipine (NORVASC) 5 MG tablet Take 1 tablet (5 mg total) by mouth daily. TAKE 1 TABLET(5 MG) BY MOUTH DAILY 03/01/22   Storm Frisk, MD  Azelastine-Fluticasone [phone] MCG/ACT SUSP Place 2 each into the nose daily. 05/24/22   Marcelyn Bruins, MD  cromolyn (OPTICROM) 4 % ophthalmic  solution Place 1 drop into both eyes daily. 05/24/22   Marcelyn Bruins, MD  cyclobenzaprine (FLEXERIL) 5 MG tablet Take 1 tablet (5 mg total) by mouth at bedtime. 03/30/22   Rising, Lurena Joiner, PA-C  divalproex (DEPAKOTE ER) 500 MG 24 hr tablet Take 1 tablet (500 mg total) by mouth in the morning and at bedtime. 03/01/22   Storm Frisk, MD  fluticasone (FLONASE) 50 MCG/ACT nasal spray Place 2 sprays into both nostrils daily. Patient not taking: Reported on 06/25/2022 03/01/22   Storm Frisk, MD  gabapentin (NEURONTIN) 300 MG capsule Take 2 capsules (600 mg total) by mouth 3 (three) times daily. Patient taking differently: Take 600 mg by mouth 3 (three) times daily as needed (pain). 04/17/22   Redwine, Madison A, PA-C  ibuprofen (ADVIL) 600 MG tablet Take 1 tablet (600 mg total) by mouth every 6 (six) hours as needed. Patient not taking: Reported on 06/05/2022 04/17/22   Redwine, Madison A, PA-C  levocetirizine (XYZAL) 5 MG tablet Take 1 tablet (5 mg total) by mouth every evening. Patient not taking: Reported on 06/25/2022 05/24/22   Marcelyn Bruins, MD  lidocaine (LIDODERM) 5 % Place 1 patch onto the skin daily. Remove & Discard patch within 12 hours or as directed by MD Patient not taking: Reported on 06/05/2022 04/17/22   Redwine, Madison A, PA-C  Vitamin D, Ergocalciferol, (DRISDOL)  1.25 MG (50000 UNIT) CAPS capsule Take 1 capsule (50,000 Units total) by mouth every Sunday. 03/04/22   Storm Frisk, MD      Allergies    Patient has no known allergies.    Review of Systems   Review of Systems  Physical Exam Updated Vital Signs BP (!) 145/123 (BP Location: Left Arm)   Pulse (!) 101   Temp 98.1 F (36.7 C) (Oral)   Resp 18   Ht 5\' 11"  (1.803 m)   Wt 90.6 kg   SpO2 100%   BMI 27.86 kg/m  Physical Exam Constitutional:      Appearance: Normal appearance.     Comments: Well-nourished well-developed clinically well appearance.  Well-groomed maintained.  HENT:      Mouth/Throat:     Pharynx: Oropharynx is clear.  Eyes:     Extraocular Movements: Extraocular movements intact.  Cardiovascular:     Rate and Rhythm: Normal rate and regular rhythm.  Pulmonary:     Effort: Pulmonary effort is normal.     Breath sounds: Normal breath sounds.  Abdominal:     General: There is no distension.     Palpations: Abdomen is soft.     Tenderness: There is no abdominal tenderness. There is no guarding.  Musculoskeletal:        General: No swelling. Normal range of motion.     Right lower leg: No edema.     Left lower leg: No edema.  Skin:    General: Skin is warm and dry.  Neurological:     General: No focal deficit present.     Mental Status: He is alert and oriented to person, place, and time.     Motor: No weakness.     Coordination: Coordination normal.  Psychiatric:     Comments: Patient is calm and appropriately interactive but at times will borderline on tearfulness.     ED Results / Procedures / Treatments   Labs (all labs ordered are listed, but only abnormal results are displayed) Labs Reviewed  COMPREHENSIVE METABOLIC PANEL - Abnormal; Notable for the following components:      Result Value   Glucose, Bld 102 (*)    Creatinine, Ser 1.28 (*)    All other components within normal limits  SALICYLATE LEVEL - Abnormal; Notable for the following components:   Salicylate Lvl <7.0 (*)    All other components within normal limits  ACETAMINOPHEN LEVEL - Abnormal; Notable for the following components:   Acetaminophen (Tylenol), Serum <10 (*)    All other components within normal limits  CBC - Abnormal; Notable for the following components:   WBC 3.9 (*)    RDW 16.5 (*)    All other components within normal limits  ETHANOL  RAPID URINE DRUG SCREEN, HOSP PERFORMED    EKG None  Radiology No results found.  Procedures Procedures    Medications Ordered in ED Medications  acetaminophen (TYLENOL) tablet 650 mg (has no administration in  time range)  ondansetron (ZOFRAN) tablet 4 mg (has no administration in time range)    ED Course/ Medical Decision Making/ A&P                             Medical Decision Making Amount and/or Complexity of Data Reviewed Labs: ordered.  Risk OTC drugs. Prescription drug management.   Patient is presented with recrudescence of homicidal ideation and possible property harm.  Patient identifies the symptoms as  having developed over the past several days and escalated.  Reportedly he is not taking his medications right now and he does not trust himself at this time.  Patient reports that previously he had controlled auditory hallucinations and now that he is not taking medications does have amplified.  This point with recurrence of homicidal ideation and plans of property harm, will reconsult TTS regarding patient's safety issues.  This time patient is not acutely psychotic in appearance.  He is focused and interacting with me appropriately.  Diagnostic results are stable from previous evaluation.  Patient is clinically well.  Medical workup is negative.  Patient is medically cleared for psychiatric assessment for homicidal ideation with recent escalation in auditory hallucinations and history of schizoaffective disorder.        Final Clinical Impression(s) / ED Diagnoses Final diagnoses:  Auditory hallucinations  Homicidal ideations    Rx / DC Orders ED Discharge Orders     None         Arby Barrette, MD 06/26/22 [phone]
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
## 2022-06-27 ENCOUNTER — Inpatient Hospital Stay (HOSPITAL_COMMUNITY)
Admission: AD | Admit: 2022-06-27 | Discharge: 2022-07-03 | DRG: 885 | Disposition: A | Discharge status: Home / Self Care | Payer: Medicaid Other | Attending: Psychiatry | Admitting: Psychiatry

## 2022-06-27 ENCOUNTER — Encounter (HOSPITAL_COMMUNITY): Payer: Self-pay

## 2022-06-27 ENCOUNTER — Other Ambulatory Visit: Payer: Self-pay

## 2022-06-27 ENCOUNTER — Encounter (HOSPITAL_COMMUNITY): Payer: Self-pay | Admitting: Nurse Practitioner

## 2022-06-27 DIAGNOSIS — Z20822 Contact with and (suspected) exposure to covid-19: Secondary | ICD-10-CM | POA: Diagnosis present

## 2022-06-27 DIAGNOSIS — Z634 Disappearance and death of family member: Secondary | ICD-10-CM

## 2022-06-27 DIAGNOSIS — R45850 Homicidal ideations: Secondary | ICD-10-CM | POA: Diagnosis present

## 2022-06-27 DIAGNOSIS — F4310 Post-traumatic stress disorder, unspecified: Secondary | ICD-10-CM | POA: Diagnosis present

## 2022-06-27 DIAGNOSIS — Z79899 Other long term (current) drug therapy: Secondary | ICD-10-CM

## 2022-06-27 DIAGNOSIS — F411 Generalized anxiety disorder: Secondary | ICD-10-CM | POA: Diagnosis present

## 2022-06-27 DIAGNOSIS — F3130 Bipolar disorder, current episode depressed, mild or moderate severity, unspecified: Principal | ICD-10-CM | POA: Diagnosis present

## 2022-06-27 DIAGNOSIS — Z560 Unemployment, unspecified: Secondary | ICD-10-CM

## 2022-06-27 DIAGNOSIS — Z91199 Patient's noncompliance with other medical treatment and regimen due to unspecified reason: Secondary | ICD-10-CM | POA: Diagnosis not present

## 2022-06-27 DIAGNOSIS — I10 Essential (primary) hypertension: Secondary | ICD-10-CM | POA: Diagnosis present

## 2022-06-27 DIAGNOSIS — F3131 Bipolar disorder, current episode depressed, mild: Secondary | ICD-10-CM | POA: Diagnosis not present

## 2022-06-27 DIAGNOSIS — F314 Bipolar disorder, current episode depressed, severe, without psychotic features: Secondary | ICD-10-CM | POA: Diagnosis not present

## 2022-06-27 DIAGNOSIS — Z818 Family history of other mental and behavioral disorders: Secondary | ICD-10-CM | POA: Diagnosis not present

## 2022-06-27 LAB — TSH: TSH: 2.823 u[IU]/mL (ref 0.350–4.500)

## 2022-06-27 MED ORDER — HALOPERIDOL 5 MG PO TABS: 5.0000 mg | ORAL_TABLET | Freq: Three times a day (TID) | ORAL | Status: DC | PRN

## 2022-06-27 MED ORDER — LORAZEPAM 1 MG PO TABS: 2.0000 mg | ORAL_TABLET | Freq: Three times a day (TID) | ORAL | Status: DC | PRN

## 2022-06-27 MED ORDER — ALUM & MAG HYDROXIDE-SIMETH 200-200-20 MG/5ML PO SUSP
30.0000 mL | ORAL | Status: DC | PRN
  Administered 2022-06-30: 30 mL via ORAL
  Filled 2022-06-27: qty 30

## 2022-06-27 MED ORDER — ACETAMINOPHEN 325 MG PO TABS
650.0000 mg | ORAL_TABLET | Freq: Four times a day (QID) | ORAL | Status: DC | PRN
  Administered 2022-06-27: 650 mg via ORAL
  Filled 2022-06-27: qty 2

## 2022-06-27 MED ORDER — DIPHENHYDRAMINE HCL 50 MG/ML IJ SOLN: 50.0000 mg | Freq: Three times a day (TID) | INTRAMUSCULAR | Status: DC | PRN

## 2022-06-27 MED ORDER — CYCLOBENZAPRINE HCL 5 MG PO TABS
5.0000 mg | ORAL_TABLET | Freq: Every day | ORAL | Status: DC
  Administered 2022-06-27: 5 mg via ORAL
  Filled 2022-06-27 (×9): qty 1

## 2022-06-27 MED ORDER — GABAPENTIN 300 MG PO CAPS
600.0000 mg | ORAL_CAPSULE | Freq: Three times a day (TID) | ORAL | Status: DC
  Administered 2022-06-27 – 2022-06-28 (×4): 600 mg via ORAL
  Filled 2022-06-27 (×27): qty 2

## 2022-06-27 MED ORDER — LORAZEPAM 2 MG/ML IJ SOLN: 2.0000 mg | Freq: Three times a day (TID) | INTRAMUSCULAR | Status: DC | PRN

## 2022-06-27 MED ORDER — AZELASTINE-FLUTICASONE 137-50 MCG/ACT NA SUSP: 2.0000 | Freq: Every day | NASAL | Status: DC

## 2022-06-27 MED ORDER — LORATADINE 10 MG PO TABS
10.0000 mg | ORAL_TABLET | Freq: Every evening | ORAL | Status: DC
  Administered 2022-06-28 – 2022-07-01 (×2): 10 mg via ORAL
  Filled 2022-06-27 (×8): qty 1

## 2022-06-27 MED ORDER — FLUTICASONE PROPIONATE 50 MCG/ACT NA SUSP
2.0000 | Freq: Every day | NASAL | Status: DC
  Administered 2022-07-01: 2 via NASAL
  Filled 2022-06-27: qty 16

## 2022-06-27 MED ORDER — DIVALPROEX SODIUM ER 500 MG PO TB24
500.0000 mg | ORAL_TABLET | Freq: Two times a day (BID) | ORAL | Status: DC
  Filled 2022-06-27 (×4): qty 1

## 2022-06-27 MED ORDER — DIPHENHYDRAMINE HCL 25 MG PO CAPS: 50.0000 mg | ORAL_CAPSULE | Freq: Three times a day (TID) | ORAL | Status: DC | PRN

## 2022-06-27 MED ORDER — LEVOCETIRIZINE DIHYDROCHLORIDE 5 MG PO TABS: 5.0000 mg | ORAL_TABLET | Freq: Every evening | ORAL | Status: DC

## 2022-06-27 MED ORDER — AMLODIPINE BESYLATE 5 MG PO TABS
5.0000 mg | ORAL_TABLET | Freq: Every day | ORAL | Status: DC
  Administered 2022-06-27 – 2022-07-03 (×7): 5 mg via ORAL
  Filled 2022-06-27 (×10): qty 1

## 2022-06-27 MED ORDER — LIDOCAINE 5 % EX PTCH
1.0000 | MEDICATED_PATCH | Freq: Every day | CUTANEOUS | Status: DC
  Administered 2022-06-27 – 2022-06-30 (×4): 1 via TRANSDERMAL
  Filled 2022-06-27 (×10): qty 1

## 2022-06-27 MED ORDER — MAGNESIUM HYDROXIDE 400 MG/5ML PO SUSP: 30.0000 mL | Freq: Every day | ORAL | Status: DC | PRN

## 2022-06-27 MED ORDER — HALOPERIDOL LACTATE 5 MG/ML IJ SOLN: 5.0000 mg | Freq: Three times a day (TID) | INTRAMUSCULAR | Status: DC | PRN

## 2022-06-27 MED ORDER — HYDROXYZINE HCL 25 MG PO TABS: 25.0000 mg | ORAL_TABLET | Freq: Three times a day (TID) | ORAL | Status: DC | PRN

## 2022-06-27 MED ORDER — TRAZODONE HCL 50 MG PO TABS: 50.0000 mg | ORAL_TABLET | Freq: Every evening | ORAL | Status: DC | PRN

## 2022-06-27 MED ORDER — LIDOCAINE 5 % EX PTCH: 1.0000 | MEDICATED_PATCH | CUTANEOUS | Status: DC

## 2022-06-27 MED ORDER — FLUTICASONE PROPIONATE 50 MCG/ACT NA SUSP
2.0000 | Freq: Every day | NASAL | Status: DC
  Administered 2022-06-28 – 2022-07-03 (×5): 2 via NASAL
  Filled 2022-06-27: qty 16

## 2022-06-27 MED ORDER — CROMOLYN SODIUM 4 % OP SOLN
1.0000 [drp] | Freq: Every day | OPHTHALMIC | Status: DC
  Administered 2022-06-28 – 2022-07-03 (×6): 1 [drp] via OPHTHALMIC
  Filled 2022-06-27: qty 10

## 2022-06-27 NOTE — Progress Notes (Signed)
   06/27/22 2030  Psych Admission Type (Psych Patients Only)  Admission Status Voluntary  Psychosocial Assessment  Patient Complaints Depression  Eye Contact Fair  Facial Expression Flat  Affect Appropriate to circumstance  Speech Logical/coherent  Interaction Assertive  Motor Activity Slow  Appearance/Hygiene Unremarkable  Behavior Characteristics Cooperative  Mood Pleasant  Thought Process  Coherency WDL  Content Blaming others  Delusions None reported or observed  Perception WDL  Hallucination None reported or observed  Judgment Poor  Confusion None  Danger to Self  Current suicidal ideation? Denies  Danger to Others  Danger to Others None reported or observed

## 2022-06-27 NOTE — Group Note (Signed)
Recreation Therapy Group Note   Group Topic:Problem Solving  Group Date: 06/27/2022 Start Time: 0932 End Time: 1012 Facilitators: Halina Asano-McCall, LRT,CTRS Location: [address] Dayroom   Goal Area(s) Addresses:  Patient will effectively work with peer towards shared goal.  Patient will identify skills used to make activity successful.  Patient will share challenges and verbalize solution-driven approaches used. Patient will identify how skills used during activity can be used to reach post d/c goals.   Group Description:  [employer]. Patients were provided the following materials: 4 drinking straws, 5 rubber bands, 5 paper clips, 2 index cards and 2 drinking cups. Using the provided materials patients were asked to build a launching mechanism to launch a ping pong ball across the room, approximately 10 feet. Patients were divided into teams of 3-5. Instructions required all materials be incorporated into the device, functionality of items left to the peer group's discretion.   Affect/Mood: Appropriate   Participation Level: Minimal   Participation Quality: Independent   Behavior: Appropriate   Speech/Thought Process: Focused   Insight: Good   Judgement: Good   Modes of Intervention: STEM Activity   Patient Response to Interventions:  Attentive   Education Outcome:  Acknowledges education   Clinical Observations/Individualized Feedback: Pt came late to group and was observant during session.      Plan: Continue to engage patient in RT group sessions 2-3x/week.   Giselle Brutus-McCall, LRT,CTRS  06/27/2022 12:54 PM

## 2022-06-27 NOTE — Progress Notes (Signed)
   06/27/22 4098  15 Minute Checks  Location Cafeteria  Visual Appearance Calm  Behavior Composed  Sleep (Behavioral Health Patients Only)  Calculate sleep? (Click Yes once per 24 hr at 0600 safety check) Yes  Documented sleep last 24 hours 0

## 2022-06-27 NOTE — ED Notes (Signed)
Pt taken to [facility] via [facility] condition is stable

## 2022-06-27 NOTE — Group Note (Signed)
Date:  06/27/2022 Time:  11:10 AM  Group Topic/Focus:  Goals Group:   The focus of this group is to help patients establish daily goals to achieve during treatment and discuss how the patient can incorporate goal setting into their daily lives to aide in recovery. Orientation:   The focus of this group is to educate the patient on the purpose and policies of crisis stabilization and provide a format to answer questions about their admission.  The group details unit policies and expectations of patients while admitted.    Participation Level:  Did Not Attend  Participation Quality:    Affect:    Cognitive:    Insight:   Engagement in Group:    Modes of Intervention:    Additional Comments:    Jaquita Rector 06/27/2022, 11:10 AM

## 2022-06-27 NOTE — Progress Notes (Signed)
Pt was accepted to [facility] TODAY 06/27/2022; Bed Assignment 403-1 PENDING IVC paperwork faxed to [facility] [phone] and please notify [facility] when fax is completed.  Dx bipolar affective d/o, currently depressed, moderate; HI.   Pt meets inpatient criteria per Earney Navy, NP-PMHNP-BC    Attending Physician will be Dr. Phineas Inches, MD  Report can be called to: Adult unit: [phone]  Pt can arrive after: [facility] will coordinate with care team.  Per [facility],Please pre admit this patient.   Care Team notified: Night [facility] Fransico Michael, RN, CSW Disposition Palmer, LCSW, Chinwendu Overland V,NP, Sindy Guadeloupe, NP, Alfonzo Feller, RN, [place], [place], Jacques Navy, RN, Day [facility] Rona Ravens, RN, [employer], [address]   [place], [place] 06/27/2022 @ 1:16 AM

## 2022-06-27 NOTE — BH IP Treatment Plan (Signed)
Interdisciplinary Treatment and Diagnostic Plan Update  06/27/2022 Time of Session: 11:30 AM  Anterrio Randell MRN: [id number]  Principal Diagnosis: Bipolar disorder current episode depressed (HCC)  Secondary Diagnoses: Principal Problem:   Bipolar disorder current episode depressed (HCC)   Current Medications:  Current Facility-Administered Medications  Medication Dose Route Frequency Provider Last Rate Last Admin   acetaminophen (TYLENOL) tablet 650 mg  650 mg Oral Q6H PRN Onuoha, Chinwendu V, NP   650 mg at 06/27/22 1251   alum & mag hydroxide-simeth (MAALOX/MYLANTA) 200-200-20 MG/5ML suspension 30 mL  30 mL Oral Q4H PRN Onuoha, Chinwendu V, NP       hydrOXYzine (ATARAX) tablet 25 mg  25 mg Oral TID PRN Onuoha, Chinwendu V, NP       LORazepam (ATIVAN) tablet 2 mg  2 mg Oral TID PRN Onuoha, Chinwendu V, NP       Or   LORazepam (ATIVAN) injection 2 mg  2 mg Intramuscular TID PRN Onuoha, Chinwendu V, NP       magnesium hydroxide (MILK OF MAGNESIA) suspension 30 mL  30 mL Oral Daily PRN Onuoha, Chinwendu V, NP       traZODone (DESYREL) tablet 50 mg  50 mg Oral QHS PRN Onuoha, Chinwendu V, NP       PTA Medications: Medications Prior to Admission  Medication Sig Dispense Refill Last Dose   acetaminophen (TYLENOL) 500 MG tablet Take 1 tablet (500 mg total) by mouth every 6 (six) hours as needed. 30 tablet 0    amLODipine (NORVASC) 5 MG tablet Take 1 tablet (5 mg total) by mouth daily. TAKE 1 TABLET(5 MG) BY MOUTH DAILY 90 tablet 1    Azelastine-Fluticasone 137-50 MCG/ACT SUSP Place 2 each into the nose daily. 23 g 3    cromolyn (OPTICROM) 4 % ophthalmic solution Place 1 drop into both eyes daily. 1 mL 5    cyclobenzaprine (FLEXERIL) 5 MG tablet Take 1 tablet (5 mg total) by mouth at bedtime. 15 tablet 0    divalproex (DEPAKOTE ER) 500 MG 24 hr tablet Take 1 tablet (500 mg total) by mouth in the morning and at bedtime. 60 tablet 4    fluticasone (FLONASE) 50 MCG/ACT nasal spray Place 2 sprays  into both nostrils daily. 16 g 6    gabapentin (NEURONTIN) 300 MG capsule Take 2 capsules (600 mg total) by mouth 3 (three) times daily. (Patient taking differently: Take 600 mg by mouth 3 (three) times daily as needed (pain).) 180 capsule 0    ibuprofen (ADVIL) 600 MG tablet Take 1 tablet (600 mg total) by mouth every 6 (six) hours as needed. 30 tablet 0    levocetirizine (XYZAL) 5 MG tablet Take 1 tablet (5 mg total) by mouth every evening. 30 tablet 5    lidocaine (LIDODERM) 5 % Place 1 patch onto the skin daily. Remove & Discard patch within 12 hours or as directed by MD 30 patch 0    Vitamin D, Ergocalciferol, (DRISDOL) 1.25 MG (50000 UNIT) CAPS capsule Take 1 capsule (50,000 Units total) by mouth every Sunday. 12 capsule 2     Patient Stressors: Financial difficulties   Health problems    Patient Strengths: Ability for insight  Active sense of humor   Treatment Modalities: Medication Management, Group therapy, Case management,  1 to 1 session with clinician, Psychoeducation, Recreational therapy.   Physician Treatment Plan for Primary Diagnosis: Bipolar disorder current episode depressed (HCC) Long Term Goal(s):     Short Term Goals:  Medication Management: Evaluate patient's response, side effects, and tolerance of medication regimen.  Therapeutic Interventions: 1 to 1 sessions, Unit Group sessions and Medication administration.  Evaluation of Outcomes: Not Progressing  Physician Treatment Plan for Secondary Diagnosis: Principal Problem:   Bipolar disorder current episode depressed (HCC)  Long Term Goal(s):     Short Term Goals:       Medication Management: Evaluate patient's response, side effects, and tolerance of medication regimen.  Therapeutic Interventions: 1 to 1 sessions, Unit Group sessions and Medication administration.  Evaluation of Outcomes: Not Progressing   RN Treatment Plan for Primary Diagnosis: Bipolar disorder current episode depressed  (HCC) Long Term Goal(s): Knowledge of disease and therapeutic regimen to maintain health will improve  Short Term Goals: Ability to remain free from injury will improve, Ability to verbalize frustration and anger appropriately will improve, Ability to demonstrate self-control, Ability to participate in decision making will improve, Ability to verbalize feelings will improve, Ability to disclose and discuss suicidal ideas, Ability to identify and develop effective coping behaviors will improve, and Compliance with prescribed medications will improve  Medication Management: RN will administer medications as ordered by provider, will assess and evaluate patient's response and provide education to patient for prescribed medication. RN will report any adverse and/or side effects to prescribing provider.  Therapeutic Interventions: 1 on 1 counseling sessions, Psychoeducation, Medication administration, Evaluate responses to treatment, Monitor vital signs and CBGs as ordered, Perform/monitor CIWA, COWS, AIMS and Fall Risk screenings as ordered, Perform wound care treatments as ordered.  Evaluation of Outcomes: Not Progressing   LCSW Treatment Plan for Primary Diagnosis: Bipolar disorder current episode depressed (HCC) Long Term Goal(s): Safe transition to appropriate next level of care at discharge, Engage patient in therapeutic group addressing interpersonal concerns.  Short Term Goals: Engage patient in aftercare planning with referrals and resources, Increase social support, Increase ability to appropriately verbalize feelings, Increase emotional regulation, Facilitate acceptance of mental health diagnosis and concerns, Facilitate patient progression through stages of change regarding substance use diagnoses and concerns, Identify triggers associated with mental health/substance abuse issues, and Increase skills for wellness and recovery  Therapeutic Interventions: Assess for all discharge needs, 1 to 1  time with Social worker, Explore available resources and support systems, Assess for adequacy in community support network, Educate family and significant other(s) on suicide prevention, Complete Psychosocial Assessment, Interpersonal group therapy.  Evaluation of Outcomes: Not Progressing   Progress in Treatment: Attending groups: No. Participating in groups: No. Taking medication as prescribed: Yes. Toleration medication: Yes. Family/Significant other contact made: No, will contact:  Whoever pt gives CSW permission to speak with  Patient understands diagnosis: No. Discussing patient identified problems/goals with staff: Yes. Medical problems stabilized or resolved: Yes. Denies suicidal/homicidal ideation: No. Issues/concerns per patient self-inventory: No.   New problem(s) identified: No, Describe:  None reported   New Short Term/Long Term Goal(s): medication stabilization, elimination of SI thoughts, development of comprehensive mental wellness plan.    Patient Goals:  " get a straight plan and seek therapy services "   Discharge Plan or Barriers: Patient recently admitted. CSW will continue to follow and assess for appropriate referrals and possible discharge planning.    Reason for Continuation of Hospitalization: Anxiety Depression Homicidal ideation Medication stabilization Suicidal ideation  Estimated Length of Stay: 4-5 day   Last 3 [place] Suicide Severity Risk Score: Flowsheet Row [facility] (Current[facility]2022 [facility]  Hospital ED from 06/25/2022 in [facility] Department at [facility]  C-SSRS RISK CATEGORY No Risk No Risk No Risk       Last PHQ 2/9 Scores:    12/05/2021    9:01 AM 08/02/2021    4:55 PM 07/17/2021    2:42 PM  Depression screen PHQ 2/9  Decreased Interest 0 3 2  Down, Depressed, Hopeless 0 3 2  PHQ - 2 Score 0 6 4   Altered sleeping 0 3 0  Tired, decreased energy 0 0 3  Change in appetite 0 3 0  Feeling bad or failure about yourself  0 0 3  Trouble concentrating 0 0 3  Moving slowly or fidgety/restless 0 0 0  Suicidal thoughts 0 0 0  PHQ-9 Score 0 12 13  Difficult doing work/chores   Not difficult at all    Scribe for Treatment Team: Isabella Bowens, LCSWA 06/27/2022 2:03 PM
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
## 2022-06-27 NOTE — Progress Notes (Signed)
Patient denies SI and A/V/H with no plan or intent. Patient continues to endorse HI towards his childhood friend. Patient appears cooperative and pleasant. A&Ox4 with slightly garbled speech. Patient uses walker to assist with ambulation and has been med compliant except for cogentin. Patient states he wishes to discuss his medications with provider before taking anymore new medications. Patient observed out in hallway and sociable with staff attended some groups.

## 2022-06-27 NOTE — Plan of Care (Signed)
°  Problem: Education: °Goal: Emotional status will improve °Outcome: Progressing °Goal: Mental status will improve °Outcome: Progressing °Goal: Verbalization of understanding the information provided will improve °Outcome: Progressing °  °
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
## 2022-06-27 NOTE — Plan of Care (Signed)
  Problem: Education: Goal: Verbalization of understanding the information provided will improve Outcome: Progressing   Problem: Activity: Goal: Interest or engagement in activities will improve Outcome: Progressing Goal: Sleeping patterns will improve Outcome: Progressing   Problem: Coping: Goal: Ability to verbalize frustrations and anger appropriately will improve Outcome: Progressing Goal: Ability to demonstrate self-control will improve Outcome: Progressing   Problem: Health Behavior/Discharge Planning: Goal: Identification of resources available to assist in meeting health care needs will improve Outcome: Progressing Goal: Compliance with treatment plan for underlying cause of condition will improve Outcome: Progressing   Problem: Physical Regulation: Goal: Ability to maintain clinical measurements within normal limits will improve Outcome: Progressing
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
## 2022-06-27 NOTE — [facility] Group Notes (Signed)
[facility] Group Notes:  (Nursing/MHT/Case Management/Adjunct)  Date:  06/27/2022  Time:  8:29 PM  Type of Therapy:   NA Group  Participation Level:  Active  Participation Quality:  Appropriate  Affect:  Appropriate  Cognitive:  Appropriate  Insight:  Appropriate  Engagement in Group:  Engaged  Modes of Intervention:  Education  Summary of Progress/Problems: Attended NA meeting.  Jeremiah Hunter 06/27/2022, 8:29 PM

## 2022-06-27 NOTE — Tx Team (Signed)
Initial Treatment Plan 06/27/2022 7:33 AM Jeremiah Hunter [id number]    PATIENT STRESSORS: Financial difficulties   Health problems     PATIENT STRENGTHS: Ability for insight  Active sense of humor    PATIENT IDENTIFIED PROBLEMS: Impulsivity   Loneliness   Anger management  Lack of close relationships.               DISCHARGE CRITERIA:  Ability to meet basic life and health needs Improved stabilization in mood, thinking, and/or behavior Motivation to continue treatment in a less acute level of care Reduction of life-threatening or endangering symptoms to within safe limits  PRELIMINARY DISCHARGE PLAN: Attend aftercare/continuing care group Return to previous living arrangement  PATIENT/FAMILY INVOLVEMENT: This treatment plan has been presented to and reviewed with the patient, Jeremiah Hunter,   The patient  have been given the opportunity to ask questions and make suggestions.  Virgina Jock, RN 06/27/2022, 7:33 AM

## 2022-06-28 LAB — VALPROIC ACID LEVEL: Valproic Acid Lvl: <10 ug/mL — ABNORMAL LOW (ref 50.0–100.0)

## 2022-06-28 MED ORDER — TAMSULOSIN HCL 0.4 MG PO CAPS
0.4000 mg | ORAL_CAPSULE | Freq: Every day | ORAL | Status: DC
  Administered 2022-06-29 – 2022-07-01 (×3): 0.4 mg via ORAL
  Filled 2022-06-28 (×8): qty 1

## 2022-06-28 MED ORDER — DIVALPROEX SODIUM ER 500 MG PO TB24
1000.0000 mg | ORAL_TABLET | Freq: Every day | ORAL | Status: DC
  Administered 2022-06-28 – 2022-07-03 (×6): 1000 mg via ORAL
  Filled 2022-06-28 (×8): qty 2

## 2022-06-28 MED ORDER — AZELASTINE HCL 0.1 % NA SOLN
2.0000 | Freq: Every day | NASAL | Status: DC
  Administered 2022-06-28 – 2022-07-03 (×6): 2 via NASAL
  Filled 2022-06-28: qty 30

## 2022-06-28 NOTE — Progress Notes (Signed)
   06/28/22 0647  15 Minute Checks  Location Cafeteria  Visual Appearance Calm  Behavior Composed  Sleep (Behavioral Health Patients Only)  Calculate sleep? (Click Yes once per 24 hr at 0600 safety check) Yes  Documented sleep last 24 hours 5.25
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
## 2022-06-28 NOTE — Group Note (Signed)
Date:  06/28/2022 Time:  11:42 AM  Group Topic/Focus:  Goals Group:   The focus of this group is to help patients establish daily goals to achieve during treatment and discuss how the patient can incorporate goal setting into their daily lives to aide in recovery.    Participation Level:  Active  Participation Quality:  Appropriate  Affect:  Excited  Cognitive:  Alert  Insight: Good  Engagement in Group:  Engaged  Modes of Intervention:  Discussion  Additional Comments:     Reymundo Poll 06/28/2022, 11:42 AM

## 2022-06-28 NOTE — Plan of Care (Signed)
  Problem: Education: Goal: Emotional status will improve Outcome: Progressing Goal: Mental status will improve Outcome: Progressing Goal: Verbalization of understanding the information provided will improve Outcome: Progressing   Problem: Activity: Goal: Interest or engagement in activities will improve Outcome: Progressing Goal: Sleeping patterns will improve Outcome: Progressing   Problem: Coping: Goal: Ability to verbalize frustrations and anger appropriately will improve Outcome: Progressing Goal: Ability to demonstrate self-control will improve Outcome: Progressing   Problem: Health Behavior/Discharge Planning: Goal: Compliance with treatment plan for underlying cause of condition will improve Outcome: Progressing   Problem: Physical Regulation: Goal: Ability to maintain clinical measurements within normal limits will improve Outcome: Progressing

## 2022-06-28 NOTE — [facility] Group Notes (Signed)
[facility] Group Notes:  (Nursing/MHT/Case Management/Adjunct)  Date:  06/28/2022  Time:  9:37 PM  Type of Therapy:   Wrap up group  Participation Level:  Active  Participation Quality:  Appropriate  Affect:  Appropriate  Cognitive:  Appropriate  Insight:  Appropriate  Engagement in Group:  Engaged  Modes of Intervention:  Education  Summary of Progress/Problems: Pt reply was no comment to all questions asked. Pt was disruptive during group, was redirected several time to keep talk down.   Noah Delaine 06/28/2022, 9:37 PM

## 2022-06-28 NOTE — Progress Notes (Signed)
   06/28/22 2040  Psych Admission Type (Psych Patients Only)  Admission Status Voluntary  Psychosocial Assessment  Patient Complaints None  Eye Contact Fair  Facial Expression Flat  Affect Appropriate to circumstance  Speech Logical/coherent  Interaction Assertive  Motor Activity Slow  Appearance/Hygiene Unremarkable  Behavior Characteristics Cooperative;Appropriate to situation  Mood Labile;Suspicious  Thought Process  Coherency WDL  Content Blaming others  Delusions None reported or observed  Perception WDL  Hallucination None reported or observed  Judgment Poor  Confusion None  Danger to Self  Current suicidal ideation? Denies  Danger to Others  Danger to Others None reported or observed

## 2022-06-29 DIAGNOSIS — F314 Bipolar disorder, current episode depressed, severe, without psychotic features: Secondary | ICD-10-CM

## 2022-06-29 LAB — HEMOGLOBIN A1C
Hgb A1c MFr Bld: 5.7 % — ABNORMAL HIGH (ref 4.8–5.6)
Mean Plasma Glucose: 117 mg/dL

## 2022-06-29 MED ORDER — WHITE PETROLATUM EX OINT
TOPICAL_OINTMENT | CUTANEOUS | Status: AC
  Filled 2022-06-29: qty 5

## 2022-06-29 NOTE — Progress Notes (Signed)
Gave patient his brown paid shirt from his locker per patient request. Also Dove body wash put in 400 med room and will give patient small amount for shower after request from patient.

## 2022-06-29 NOTE — Group Note (Signed)
Date:  06/29/2022 Time:  6:57 PM  Group Topic/Focus:  Support and Check-in-Open Discussion and Journaling    Participation Level:  Active  Participation Quality:  Appropriate  Affect:  Appropriate  Cognitive:  Appropriate  Insight: Appropriate  Engagement in Group:  Engaged  Modes of Intervention:  Activity, Discussion, and Support  Additional Comments:   Pt  attended and actively participated in the support check in, journaling and open discussion group.  Jeremiah Hunter 06/29/2022, 6:57 PM
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
## 2022-06-29 NOTE — Group Note (Signed)
Date:  06/29/2022 Time:  2:35 PM  Group Topic/Focus:  Emotional Education:   The focus of this group is to discuss what feelings/emotions are, and how they are experienced.    Participation Level:  Active  Participation Quality:  Appropriate  Affect:  Appropriate  Cognitive:  Alert  Insight: Appropriate  Engagement in Group:  Engaged  Modes of Intervention:  Clarification and Education  Additional Comments:    Beckie Busing 06/29/2022, 2:35 PM

## 2022-06-29 NOTE — Progress Notes (Signed)
Patient continues to refuse his scheduled medication of Flexeril, Neurontin, lidocaine patch and Flomax . Patient states " the physician lied to me concerning my medications and so l will not take any and l do not think l need these medications at this time" Provider on call Chinwendu Welford Roche -NP notified.
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
## 2022-06-29 NOTE — Progress Notes (Addendum)
   06/29/22 1952  Psych Admission Type (Psych Patients Only)  Admission Status Voluntary  Psychosocial Assessment  Patient Complaints Depression  Eye Contact Fair  Facial Expression Anxious  Affect Appropriate to circumstance  Speech Logical/coherent  Interaction Assertive  Motor Activity Slow  Appearance/Hygiene Unremarkable  Behavior Characteristics Anxious  Mood Anxious  Thought Process  Coherency WDL  Content WDL  Delusions None reported or observed  Perception WDL  Hallucination None reported or observed  Judgment Poor  Confusion None  Danger to Self  Current suicidal ideation? Denies  Danger to Others  Danger to Others None reported or observed   Progress note   D: Pt seen in dayroom. Pt denies SI, HI, AVH. Contracts for safety. Pt rates pain  0/10. Pt says that he stepped out of his car and fell down on his shoulder. Denies pain in shoulder right now. Pt rates anxiety  0/10 and depression  3/10. States he has been depressed since 1969. Pt has a walker that he sits outside of the dayroom. Pt states he only needs it when he walks more than 100 ft. Pt cautioned to have it close by for ease of use. Pt states he has a cane he uses at home. Pt wants to speak to a provider about his medications. "I only take certain medicines once a day and that's it." Pt encouraged to do that. Pt talks a lot but doesn't provide much information. No other concerns noted at this time.  A: Pt provided support and encouragement. Pt given scheduled medication as prescribed. PRNs as appropriate. Q15 min checks for safety.   R: Pt safe on the unit. Will continue to monitor.

## 2022-06-29 NOTE — Group Note (Signed)
Date:  06/29/2022 Time:  4:31 PM  Group Topic/Focus:  Goals Group:   The focus of this group is to help patients establish daily goals to achieve during treatment and discuss how the patient can incorporate goal setting into their daily lives to aide in recovery. Orientation:   The focus of this group is to educate the patient on the purpose and policies of crisis stabilization and provide a format to answer questions about their admission.  The group details unit policies and expectations of patients while admitted.    Participation Level:  Active  Participation Quality:  Appropriate  Affect:  Appropriate  Cognitive:  Appropriate  Insight: Appropriate  Engagement in Group:  Engaged  Modes of Intervention:  Activity, Discussion, and Orientation  Additional Comments:   Pt attended and participated in the Orientation/Goals group.  Edmund Hilda Acy Orsak 06/29/2022, 4:31 PM

## 2022-06-29 NOTE — Group Note (Signed)
Recreation Therapy Group Note   Group Topic:Other  Group Date: 06/29/2022 Start Time: 1300 End Time: 1400 Facilitators: Geron Mulford-McCall, LRT,CTRS Location: [address] Dayroom   Activity Description/Intervention: Therapeutic Drumming. Patients with peers and staff were given the opportunity to engage in a leader facilitated [facility] Empowerment Drumming Circle with staff from the [employer], in partnership with [employer]. Teaching laboratory technician and trained [employer], Theodoro Doing leading with LRT observing and documenting intervention and pt response. This evidenced-based practice targets 7 areas of health and wellbeing in the human experience including: stress-reduction, exercise, self-expression, camaraderie/support, nurturing, spirituality, and music-making (leisure).   Goal Area(s) Addresses:  Patient will engage in pro-social way in music group.  Patient will follow directions of drum leader on the first prompt. Patient will demonstrate no behavioral issues during group.  Patient will identify if a reduction in stress level occurs as a result of participation in therapeutic drum circle.    Affect/Mood: Appropriate   Participation Level: Minimal   Participation Quality: Independent   Behavior: Appropriate   Speech/Thought Process: Focused   Insight: Good   Judgement: Good   Modes of Intervention: Teaching laboratory technician   Patient Response to Interventions:  Attentive   Education Outcome:  Acknowledges education   Clinical Observations/Individualized Feedback: Jeremiah Hunter came in for the last 20 minutes of group.  Pt sang along with the music and was attentive to peers and instructor as they played. Pt was appropriate with peers, staff, and musical equipment for duration of programming.  Pt identified "good" as their feeling after participation in music-based programming. Pt affect congruent/incongruent with verbalized emotion.   Plan:  Continue to engage patient in RT group sessions 2-3x/week.   Jeremiah Hunter, LRT,CTRS 06/29/2022 2:58 PM

## 2022-06-29 NOTE — Plan of Care (Signed)
Nurse discussed anxiety, depression and coping skills with patient.  

## 2022-06-29 NOTE — Progress Notes (Signed)
Pt refused night time medications. Will continue to monitor. 

## 2022-06-29 NOTE — Progress Notes (Addendum)
D:  Patient's self inventory sheet, patient sleeps good.  Good appetite, normal energy level, good concentration.  Denied SI and HI, contracts for safety.  Denied A/V hallucinations.   A:    Emotional support and encouragement given patient.  Patient refused flexeril 5 mg and neurotin 600 mg.  Patient did take  depakote 1,000 mg  after being asked several times and talking to MD again.  R:  Safety maintained with 15 minute checks.    Patient refused neurotin 600 mg at 1400.

## 2022-06-30 MED ORDER — WHITE PETROLATUM EX OINT
TOPICAL_OINTMENT | CUTANEOUS | Status: AC
  Administered 2022-06-30: 1
  Filled 2022-06-30: qty 5

## 2022-06-30 NOTE — Group Note (Signed)
Date:  06/30/2022 Time:  6:23 PM  Group Topic/Focus:  Support and Check in- Open Discussion/Journaling    Participation Level:  Active  Participation Quality:  Appropriate  Affect:  Appropriate  Cognitive:  Appropriate  Insight: Appropriate  Engagement in Group:  Engaged  Modes of Intervention:  Activity, Discussion, and Support  Additional Comments:   Pt attended and participated in the Support Check in, journaling and open discussion group.  Jeremiah Hunter Jeremiah Hunter Gustafson 06/30/2022, 6:23 PM

## 2022-06-30 NOTE — Progress Notes (Signed)
   06/30/22 0600  15 Minute Checks  Location Hallway  Visual Appearance Calm  Behavior Composed  Sleep (Behavioral Health Patients Only)  Calculate sleep? (Click Yes once per 24 hr at 0600 safety check) Yes  Documented sleep last 24 hours 7.25

## 2022-06-30 NOTE — Progress Notes (Signed)
Pt is A&OX4, calm, denies suicidal ideations, denies homicidal ideations, denies auditory hallucinations and denies visual hallucinations. Pt verbally agrees to approach staff if these become apparent and before harming self or others. Pt denies experiencing nightmares. Mood and affect are congruent. Pt appetite is ok. No complaints of anxiety, distress, pain and/or discomfort at this time. Pt's memory appears to be grossly intact, and Pt hasn't displayed any injurious behaviors. Pt is NOT medication compliant. Pt continues to refuse specific medication. Pt reported his goal for today is to "raise money for [facility]." There's no evidence of suicidal intent. Psychomotor activity was WNL. No s/s of Parkinson, Dystonia, Akathisia and/or Tardive Dyskinesia noted.

## 2022-06-30 NOTE — Progress Notes (Signed)
   06/30/22 2014  Psych Admission Type (Psych Patients Only)  Admission Status Voluntary  Psychosocial Assessment  Patient Complaints Depression  Eye Contact Fair  Facial Expression Anxious  Affect Appropriate to circumstance  Speech Logical/coherent  Interaction Assertive  Motor Activity Slow  Appearance/Hygiene Unremarkable  Behavior Characteristics Anxious  Mood Anxious  Thought Process  Coherency WDL  Content WDL  Delusions None reported or observed  Perception WDL  Hallucination None reported or observed  Judgment Poor  Confusion None  Danger to Self  Current suicidal ideation? Denies  Danger to Others  Danger to Others None reported or observed   Alert/oriented. Makes needs/concerns known to staff. Pleasant cooperative with staff. Denies SI/HI/A/V hallucinations. Med compliant. PRN med given with good effect. Patient states went to group. Will encourage compliance and progression towards goals. Verbally contracted for safety. Will continue to monitor.

## 2022-06-30 NOTE — Group Note (Signed)
Date:  06/30/2022 Time:  4:52 PM  Group Topic/Focus:  Dimensions of Wellness:   The focus of this group is to introduce the topic of wellness and discuss the role each dimension of wellness plays in total health.    Participation Level:  Active  Participation Quality:  Appropriate  Affect:  Appropriate  Cognitive:  Appropriate  Insight: Appropriate  Engagement in Group:  Engaged  Modes of Intervention:  Activity and Discussion  Additional Comments:   Pt attended and participated in the Social Wellness Group.   Jeremiah Hunter 06/30/2022, 4:52 PM

## 2022-06-30 NOTE — Progress Notes (Signed)
Pt refused his morning dose of gabapentin. "I'm not taking that." Will continue to monitor.
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
## 2022-06-30 NOTE — Group Note (Signed)
Date:  06/30/2022 Time:  4:32 PM  Group Topic/Focus:  Goals Group:   The focus of this group is to help patients establish daily goals to achieve during treatment and discuss how the patient can incorporate goal setting into their daily lives to aide in recovery. Orientation:   The focus of this group is to educate the patient on the purpose and policies of crisis stabilization and provide a format to answer questions about their admission.  The group details unit policies and expectations of patients while admitted.    Participation Level:  Active  Participation Quality:  Appropriate  Affect:  Appropriate  Cognitive:  Appropriate  Insight: Appropriate  Engagement in Group:  Engaged  Modes of Intervention:  Activity, Discussion, and Orientation  Additional Comments:   Pt attended and participated in the Orientation/Goals Group.  Jeremiah Hunter 06/30/2022, 4:32 PM

## 2022-06-30 NOTE — Plan of Care (Signed)
  Problem: Safety: Goal: Periods of time without injury will increase Outcome: Progressing   

## 2022-06-30 NOTE — [facility] Group Notes (Signed)
Pt was informed via intercom of wrap up, but opted out of attending.

## 2022-07-01 LAB — VALPROIC ACID LEVEL: Valproic Acid Lvl: 67 ug/mL (ref 50.0–100.0)

## 2022-07-01 NOTE — Progress Notes (Signed)
Pt on unit, pt refused gabapentin and lidoderm patch stating he does not need it. Pt denies pain. Pt ambulating in hallway with walker. Pt requests to be called Jeremiah Hunter stating the name Jurgen is triggering. Q 15 minute checks ongoing.

## 2022-07-01 NOTE — Plan of Care (Signed)
  Problem: Safety: Goal: Periods of time without injury will increase Outcome: Progressing   

## 2022-07-01 NOTE — Group Note (Signed)
Date:  07/01/2022 Time:  5:21 PM  Group Topic/Focus:  Dimensions of Wellness:   The focus of this group is to introduce the topic of wellness and discuss the role each dimension of wellness plays in total health. Emotional Education:   The focus of this group is to discuss what feelings/emotions are, and how they are experienced.    Participation Level:  Active  Participation Quality:  Appropriate  Affect:  Appropriate  Cognitive:  Appropriate  Insight: Appropriate  Engagement in Group:  Engaged  Modes of Intervention:  Activity and Discussion  Additional Comments:    Pt attended and participated in the Emotional Wellness group. Edmund Hilda Chaysen Tillman 07/01/2022, 5:21 PM

## 2022-07-01 NOTE — Group Note (Signed)
Date:  07/01/2022 Time:  4:32 PM  Group Topic/Focus:  Goals Group:   The focus of this group is to help patients establish daily goals to achieve during treatment and discuss how the patient can incorporate goal setting into their daily lives to aide in recovery. Orientation:   The focus of this group is to educate the patient on the purpose and policies of crisis stabilization and provide a format to answer questions about their admission.  The group details unit policies and expectations of patients while admitted.    Participation Level:  Active  Participation Quality:  Appropriate  Affect:  Appropriate  Cognitive:  Appropriate  Insight: Appropriate  Engagement in Group:  Engaged  Modes of Intervention:  Activity, Discussion, and Orientation  Additional Comments:   Pt attended and participated in the Orientation/Goals group.   Edmund Hilda Christiana Gurevich 07/01/2022, 4:32 PM

## 2022-07-01 NOTE — Progress Notes (Signed)
   07/01/22 1951  Psych Admission Type (Psych Patients Only)  Admission Status Voluntary  Psychosocial Assessment  Patient Complaints Depression  Eye Contact Fair  Facial Expression Anxious  Affect Appropriate to circumstance  Speech Logical/coherent  Interaction Assertive  Motor Activity Slow  Appearance/Hygiene Unremarkable  Behavior Characteristics Anxious  Mood Anxious  Thought Process  Coherency WDL  Content WDL  Delusions None reported or observed  Perception WDL  Hallucination None reported or observed  Judgment Poor  Confusion None  Danger to Self  Current suicidal ideation? Denies  Danger to Others  Danger to Others None reported or observed   Alert/oriented. Makes needs/concerns known to staff. Pleasant cooperative with staff. Denies SI/HI/A/V hallucinations.  Patient states went to group. Will encourage compliance and progression towards goals. Verbally contracted for safety. Will continue to monitor.

## 2022-07-01 NOTE — Progress Notes (Signed)
Patient rated his day as a 10 out of a possible 10. He would not share anything about his day except to say that he is "seeking validation". He was redirected for talking out of turn on multiple occasions.
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
## 2022-07-02 ENCOUNTER — Encounter (HOSPITAL_COMMUNITY): Payer: Self-pay
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
## 2022-07-02 NOTE — Group Note (Signed)
Date:  07/02/2022 Time:  10:14 AM  Group Topic/Focus:  Goals Group:   The focus of this group is to help patients establish daily goals to achieve during treatment and discuss how the patient can incorporate goal setting into their daily lives to aide in recovery.    Participation Level:  Active  Participation Quality:  Appropriate  Affect:  Appropriate  Cognitive:  Alert  Insight: Appropriate  Engagement in Group:  Engaged  Modes of Intervention:  Discussion  Additional Comments: Pt attended group and stated that their goal was too stay positive today.  Beckie Busing 07/02/2022, 10:14 AM

## 2022-07-02 NOTE — Plan of Care (Signed)
  Problem: Safety: Goal: Periods of time without injury will increase Outcome: Progressing   

## 2022-07-02 NOTE — Progress Notes (Addendum)
Patient denies SI, HI and AVH. Patient rates depression 3/10, hopelessness 3/10 and anxiety 3/10, stating that is their "normal" rating. Patient interaction is pleasant, calm and cooperative. Patient offers no further concerns today. Safety maintained with q 15 min checks.   07/02/22 1000  Psych Admission Type (Psych Patients Only)  Admission Status Voluntary  Psychosocial Assessment  Patient Complaints None  Eye Contact Fair  Facial Expression Animated  Affect Appropriate to circumstance  Speech Logical/coherent  Interaction Assertive  Motor Activity Slow  Appearance/Hygiene Unremarkable  Behavior Characteristics Cooperative;Appropriate to situation  Mood Pleasant  Thought Process  Coherency WDL  Content WDL  Delusions None reported or observed  Perception WDL  Hallucination None reported or observed  Judgment WDL  Confusion None  Danger to Self  Current suicidal ideation? Denies  Danger to Others  Danger to Others None reported or observed

## 2022-07-02 NOTE — [facility] Suicide Risk Assessment (Signed)
[facility] INPATIENT:  Family/Significant Other Suicide Prevention Education  Suicide Prevention Education:  Education Completed; PSI: Deloria Lair [phone],  (name of family member/significant other) has been identified by the patient as the family member/significant other with whom the patient will be residing, and identified as the person(s) who will aid the patient in the event of a mental health crisis (suicidal ideations/suicide attempt).  With written consent from the patient, the family member/significant other has been provided the following suicide prevention education, prior to the and/or following the discharge of the patient.  Abby had no safety concerns for the 3 years she has had patient on caseload. Abby said that the Crisis line may be able to pick patient up but states that if facility had to provide transportation then they will visit patient after lunch the day he DC. Abby denies any guns or weapons in patient home.   The suicide prevention education provided includes the following: Suicide risk factors Suicide prevention and interventions National Suicide Hotline telephone number [facility] assessment telephone number [facility] Emergency Assistance 911 [facility] and/or Residential Mobile Crisis Unit telephone number  Request made of family/significant other to: Remove weapons (e.g., guns, rifles, knives), all items previously/currently identified as safety concern.   Remove drugs/medications (over-the-counter, prescriptions, illicit drugs), all items previously/currently identified as a safety concern.  The family member/significant other verbalizes understanding of the suicide prevention education information provided.  The family member/significant other agrees to remove the items of safety concern listed above.  Isabella Bowens 07/02/2022, 11:46 AM

## 2022-07-02 NOTE — Group Note (Signed)
Date:  07/02/2022 Time:  9:49 PM  Group Topic/Focus:  Recovery Goals:   The focus of this group is to identify appropriate goals for recovery and establish a plan to achieve them.    Participation Level:  Active  Participation Quality:  Appropriate  Affect:  Appropriate  Cognitive:  Appropriate  Insight: Appropriate  Engagement in Group:  Engaged  Modes of Intervention:   n/a  Additional Comments:  n/a  Saryah Loper E Dnyla Antonetti 07/02/2022, 9:49 PM

## 2022-07-02 NOTE — Group Note (Signed)
Occupational Therapy Group Note  Group Topic:Coping Skills  Group Date: 07/02/2022 Start Time: 1430 End Time: 1505 Facilitators: Ted Mcalpine, OT   Group Description: Group encouraged increased engagement and participation through discussion and activity focused on "Coping Ahead." Patients were split up into teams and selected a card from a stack of positive coping strategies. Patients were instructed to act out/charade the coping skill for other peers to guess and receive points for their team. Discussion followed with a focus on identifying additional positive coping strategies and patients shared how they were going to cope ahead over the weekend while continuing hospitalization stay.  Therapeutic Goal(s): Identify positive vs negative coping strategies. Identify coping skills to be used during hospitalization vs coping skills outside of hospital/at home Increase participation in therapeutic group environment and promote engagement in treatment   Participation Level: Engaged   Participation Quality: Independent   Behavior: Appropriate   Speech/Thought Process: Relevant   Affect/Mood: Appropriate   Insight: Fair   Judgement: Fair      Modes of Intervention: Education  Patient Response to Interventions:  Attentive   Plan: Continue to engage patient in OT groups 2 - 3x/week.  07/02/2022  Ted Mcalpine, OT  Kerrin Champagne, OT

## 2022-07-02 NOTE — BH IP Treatment Plan (Signed)
Interdisciplinary Treatment and Diagnostic Plan Update  07/02/2022 Time of Session: 8:30am, update  Jeremiah Hunter MRN: [id number]  Principal Diagnosis: Bipolar disorder current episode depressed (HCC)  Secondary Diagnoses: Principal Problem:   Bipolar disorder current episode depressed (HCC)   Current Medications:  Current Facility-Administered Medications  Medication Dose Route Frequency Provider Last Rate Last Admin   acetaminophen (TYLENOL) tablet 650 mg  650 mg Oral Q6H PRN Onuoha, Chinwendu V, NP   650 mg at 06/27/22 1251   alum & mag hydroxide-simeth (MAALOX/MYLANTA) 200-200-20 MG/5ML suspension 30 mL  30 mL Oral Q4H PRN Onuoha, Chinwendu V, NP   30 mL at 06/30/22 2007   amLODipine (NORVASC) tablet 5 mg  5 mg Oral Daily Massengill, Nathan, MD   5 mg at 07/02/22 0803   azelastine (ASTELIN) 0.1 % nasal spray 2 spray  2 spray Each Nare Daily Otho Bellows, RPH   2 spray at 07/02/22 0805   cromolyn (OPTICROM) 4 % ophthalmic solution 1 drop  1 drop Both Eyes Daily Massengill, Harrold Donath, MD   1 drop at 07/02/22 0805   cyclobenzaprine (FLEXERIL) tablet 5 mg  5 mg Oral QHS Massengill, Harrold Donath, MD   5 mg at 06/27/22 2144   haloperidol (HALDOL) tablet 5 mg  5 mg Oral TID PRN Phineas Inches, MD       And   LORazepam (ATIVAN) tablet 2 mg  2 mg Oral TID PRN Phineas Inches, MD       And   diphenhydrAMINE (BENADRYL) capsule 50 mg  50 mg Oral TID PRN Massengill, Harrold Donath, MD       haloperidol lactate (HALDOL) injection 5 mg  5 mg Intramuscular TID PRN Massengill, Harrold Donath, MD       And   LORazepam (ATIVAN) injection 2 mg  2 mg Intramuscular TID PRN Massengill, Harrold Donath, MD       And   diphenhydrAMINE (BENADRYL) injection 50 mg  50 mg Intramuscular TID PRN Massengill, Harrold Donath, MD       divalproex (DEPAKOTE ER) 24 hr tablet 1,000 mg  1,000 mg Oral Daily Massengill, Nathan, MD   1,000 mg at 07/02/22 0803   fluticasone (FLONASE) 50 MCG/ACT nasal spray 2 spray  2 spray Each Nare Daily Massengill,  Nathan, MD   2 spray at 07/01/22 1011   fluticasone (FLONASE) 50 MCG/ACT nasal spray 2 spray  2 spray Each Nare Daily Lynden Ang, RPH   2 spray at 07/02/22 0805   gabapentin (NEURONTIN) capsule 600 mg  600 mg Oral Q8H Massengill, Nathan, MD   600 mg at 06/28/22 1313   hydrOXYzine (ATARAX) tablet 25 mg  25 mg Oral TID PRN Onuoha, Chinwendu V, NP       lidocaine (LIDODERM) 5 % 1 patch  1 patch Transdermal Daily Massengill, Nathan, MD   1 patch at 06/30/22 0745   loratadine (CLARITIN) tablet 10 mg  10 mg Oral QPM Massengill, Nathan, MD   10 mg at 07/01/22 1647   magnesium hydroxide (MILK OF MAGNESIA) suspension 30 mL  30 mL Oral Daily PRN Onuoha, Chinwendu V, NP       tamsulosin (FLOMAX) capsule 0.4 mg  0.4 mg Oral Daily Ntuen, Tina C, FNP   0.4 mg at 07/01/22 1010   traZODone (DESYREL) tablet 50 mg  50 mg Oral QHS PRN Onuoha, Chinwendu V, NP       PTA Medications: Medications Prior to Admission  Medication Sig Dispense Refill Last Dose   acetaminophen (TYLENOL) 500 MG tablet Take 1 tablet (  500 mg total) by mouth every 6 (six) hours as needed. 30 tablet 0    amLODipine (NORVASC) 5 MG tablet Take 1 tablet (5 mg total) by mouth daily. TAKE 1 TABLET(5 MG) BY MOUTH DAILY 90 tablet 1    Azelastine-Fluticasone 137-50 MCG/ACT SUSP Place 2 each into the nose daily. 23 g 3    cromolyn (OPTICROM) 4 % ophthalmic solution Place 1 drop into both eyes daily. 1 mL 5    cyclobenzaprine (FLEXERIL) 5 MG tablet Take 1 tablet (5 mg total) by mouth at bedtime. 15 tablet 0    divalproex (DEPAKOTE ER) 500 MG 24 hr tablet Take 1 tablet (500 mg total) by mouth in the morning and at bedtime. 60 tablet 4    fluticasone (FLONASE) 50 MCG/ACT nasal spray Place 2 sprays into both nostrils daily. 16 g 6    gabapentin (NEURONTIN) 300 MG capsule Take 2 capsules (600 mg total) by mouth 3 (three) times daily. (Patient taking differently: Take 600 mg by mouth 3 (three) times daily as needed (pain).) 180 capsule 0    ibuprofen  (ADVIL) 600 MG tablet Take 1 tablet (600 mg total) by mouth every 6 (six) hours as needed. 30 tablet 0    levocetirizine (XYZAL) 5 MG tablet Take 1 tablet (5 mg total) by mouth every evening. 30 tablet 5    lidocaine (LIDODERM) 5 % Place 1 patch onto the skin daily. Remove & Discard patch within 12 hours or as directed by MD 30 patch 0    Vitamin D, Ergocalciferol, (DRISDOL) 1.25 MG (50000 UNIT) CAPS capsule Take 1 capsule (50,000 Units total) by mouth every Sunday. 12 capsule 2     Patient Stressors: Financial difficulties   Health problems    Patient Strengths: Ability for insight  Active sense of humor   Treatment Modalities: Medication Management, Group therapy, Case management,  1 to 1 session with clinician, Psychoeducation, Recreational therapy.   Physician Treatment Plan for Primary Diagnosis: Bipolar disorder current episode depressed (HCC) Long Term Goal(s): Improvement in symptoms so as ready for discharge   Short Term Goals: Ability to identify changes in lifestyle to reduce recurrence of condition will improve Ability to verbalize feelings will improve Ability to disclose and discuss suicidal ideas Ability to demonstrate self-control will improve Ability to identify and develop effective coping behaviors will improve Ability to maintain clinical measurements within normal limits will improve Compliance with prescribed medications will improve Ability to identify triggers associated with substance abuse/mental health issues will improve  Medication Management: Evaluate patient's response, side effects, and tolerance of medication regimen.  Therapeutic Interventions: 1 to 1 sessions, Unit Group sessions and Medication administration.  Evaluation of Outcomes: Progressing  Physician Treatment Plan for Secondary Diagnosis: Principal Problem:   Bipolar disorder current episode depressed (HCC)  Long Term Goal(s): Improvement in symptoms so as ready for discharge   Short  Term Goals: Ability to identify changes in lifestyle to reduce recurrence of condition will improve Ability to verbalize feelings will improve Ability to disclose and discuss suicidal ideas Ability to demonstrate self-control will improve Ability to identify and develop effective coping behaviors will improve Ability to maintain clinical measurements within normal limits will improve Compliance with prescribed medications will improve Ability to identify triggers associated with substance abuse/mental health issues will improve     Medication Management: Evaluate patient's response, side effects, and tolerance of medication regimen.  Therapeutic Interventions: 1 to 1 sessions, Unit Group sessions and Medication administration.  Evaluation of Outcomes: Progressing  RN Treatment Plan for Primary Diagnosis: Bipolar disorder current episode depressed (HCC) Long Term Goal(s): Knowledge of disease and therapeutic regimen to maintain health will improve  Short Term Goals: Ability to remain free from injury will improve, Ability to verbalize frustration and anger appropriately will improve, Ability to demonstrate self-control, Ability to participate in decision making will improve, Ability to verbalize feelings will improve, Ability to disclose and discuss suicidal ideas, Ability to identify and develop effective coping behaviors will improve, and Compliance with prescribed medications will improve  Medication Management: RN will administer medications as ordered by provider, will assess and evaluate patient's response and provide education to patient for prescribed medication. RN will report any adverse and/or side effects to prescribing provider.  Therapeutic Interventions: 1 on 1 counseling sessions, Psychoeducation, Medication administration, Evaluate responses to treatment, Monitor vital signs and CBGs as ordered, Perform/monitor CIWA, COWS, AIMS and Fall Risk screenings as ordered, Perform  wound care treatments as ordered.  Evaluation of Outcomes: Progressing   LCSW Treatment Plan for Primary Diagnosis: Bipolar disorder current episode depressed (HCC) Long Term Goal(s): Safe transition to appropriate next level of care at discharge, Engage patient in therapeutic group addressing interpersonal concerns.  Short Term Goals: Engage patient in aftercare planning with referrals and resources, Increase social support, Increase ability to appropriately verbalize feelings, Increase emotional regulation, Facilitate acceptance of mental health diagnosis and concerns, Facilitate patient progression through stages of change regarding substance use diagnoses and concerns, Identify triggers associated with mental health/substance abuse issues, and Increase skills for wellness and recovery  Therapeutic Interventions: Assess for all discharge needs, 1 to 1 time with Social worker, Explore available resources and support systems, Assess for adequacy in community support network, Educate family and significant other(s) on suicide prevention, Complete Psychosocial Assessment, Interpersonal group therapy.  Evaluation of Outcomes: Progressing   Progress in Treatment: Attending groups: No. Participating in groups: No. Taking medication as prescribed: Yes. Toleration medication: Yes. Family/Significant other contact made: No, will contact:  Whoever pt gives CSW permission to speak with  Patient understands diagnosis: No. Discussing patient identified problems/goals with staff: Yes. Medical problems stabilized or resolved: Yes. Denies suicidal/homicidal ideation: No. Issues/concerns per patient self-inventory: No.     New problem(s) identified: No, Describe:  None reported    New Short Term/Long Term Goal(s): medication stabilization, elimination of SI thoughts, development of comprehensive mental wellness plan.     Patient Goals:  " get a straight plan and seek therapy services "     Discharge Plan or Barriers: Patient recently admitted. CSW will continue to follow and assess for appropriate referrals and possible discharge planning.     Reason for Continuation of Hospitalization: Anxiety Depression Homicidal ideation Medication stabilization Suicidal ideation   Estimated Length of Stay: 1-3  day    Last 3 [place] Suicide Severity Risk Score: Flowsheet Row [facility] (Current) [facility] from [facility]/2022 in [facility] ED from 06/25/2022 in [facility] Emergency Department at [facility]  C-SSRS RISK CATEGORY No Risk No Risk No Risk       Last PHQ 2/9 Scores:    12/05/2021    9:01 AM 08/02/2021    4:55 PM 07/17/2021    2:42 PM  Depression screen PHQ 2/9  Decreased Interest 0 3 2  Down, Depressed, Hopeless 0 3 2  PHQ - 2 Score 0 6 4  Altered sleeping 0 3 0  Tired, decreased energy 0  0 3  Change in appetite 0 3 0  Feeling bad or failure about yourself  0 0 3  Trouble concentrating 0 0 3  Moving slowly or fidgety/restless 0 0 0  Suicidal thoughts 0 0 0  PHQ-9 Score 0 12 13  Difficult doing work/chores   Not difficult at all    Scribe for Treatment Team: Beatris Si, LCSW 07/02/2022 4:39 PM

## 2022-07-02 NOTE — Group Note (Signed)
Recreation Therapy Group Note   Group Topic:Healthy Decision Making  Group Date: 07/02/2022 Start Time: 0935 End Time: 1015 Facilitators: Urie Loughner-McCall, LRT,CTRS Location: [address] Dayroom   Goal Area(s) Addresses:  Patient will effectively work with peer towards shared goal.  Patient will identify factors that guided their decision making.  Patient will pro-socially communicate ideas during group session.   Group Descripiton:  Patients were given a scenario that they were going to be stranded on a deserted [place] for several months before being rescued. Writer tasked them with making a list of 15 things they would choose to bring with them for "survival". The list of items was prioritized most important to least. Each patient would come up with their own list, then work together to create a new list of 15 items while in a group of 3-5 peers. LRT discussed each person's list and how it differed from others. The debrief included discussion of priorities, good decisions versus bad decisions, and how it is important to think before acting so we can make the best decision possible. LRT tied the concept of effective communication among group members to patient's support systems outside of the hospital and its benefit post discharge.   Affect/Mood: Appropriate   Participation Level: Engaged   Participation Quality: Independent   Behavior: Appropriate   Speech/Thought Process: Focused   Insight: Good   Judgement: Good   Modes of Intervention: Group work   Patient Response to Interventions:  Engaged   Education Outcome:  Acknowledges education   Clinical Observations/Individualized Feedback: Pt was appropriate and engaged.  Pt was creative in some of his answers.  Pt also worked well with peers in coming up with list.  Pt was able to add an [place], gun, flash light, helicopter and boat.     Plan: Continue to engage patient in RT group sessions  2-3x/week.   Amdrew Oboyle-McCall, LRT,CTRS  07/02/2022 12:49 PM

## 2022-07-02 NOTE — BHH Group Notes (Signed)
Spiritual care group on grief and loss facilitated by Chaplain Dyanne Carrel, B[facility]  Group Goal: Support / Education around grief and loss  Members engage in facilitated group support and psycho-social education.  Group Description:  Following introductions and group rules, group members engaged in facilitated group dialogue and support around topic of loss, with particular support around experiences of loss in their lives. Group Identified types of loss (relationships / self / things) and identified patterns, circumstances, and changes that precipitate losses. Reflected on thoughts / feelings around loss, normalized grief responses, and recognized variety in grief experience. Group encouraged individual reflection on safe space and on the coping skills that they are already utilizing.  Group drew on Adlerian / Rogerian and narrative framework  Patient Progress: Pt attended group and participated towards the beginning of group.  He fell asleep partway through.

## 2022-07-02 NOTE — BHH Counselor (Signed)
BHH/BMU LCSW Progress Note   07/02/2022    3:24 PM  Mingo Amber      Type of Note: Collateral With Daughter    CSW spoke with patient daughter per his request on Friday. Daughter said that she did not know what all has been going on with her dad , last time they text was on June 24, 2022. Daughter said she began to worry because they just recently had death in the family that she was trying to notify him about it but he never responded, so she then knew something was wrong. Furthermore, daughter was appreciative of the call and said that she will follow up with her dad later on tomorrow.     Signed:   Jacob Moores, MSW, LCSWA 07/02/2022 3:24 PM

## 2022-07-02 NOTE — Progress Notes (Signed)
   07/02/22 2022  Psych Admission Type (Psych Patients Only)  Admission Status Voluntary  Psychosocial Assessment  Patient Complaints None  Eye Contact Fair  Facial Expression Anxious  Affect Appropriate to circumstance  Speech Logical/coherent  Interaction Assertive  Motor Activity Slow  Appearance/Hygiene Unremarkable  Behavior Characteristics Cooperative;Appropriate to situation  Mood Pleasant  Thought Process  Coherency WDL  Content WDL  Delusions None reported or observed  Perception WDL  Hallucination None reported or observed  Judgment Poor  Confusion None  Danger to Self  Current suicidal ideation? Denies  Danger to Others  Danger to Others None reported or observed   Alert/oriented. Makes needs/concerns known to staff.  Cooperative with staff. Denies SI/HI/A/V hallucinations. Med compliant. Patient states went to group. Will encourage compliance and progression towards goals. Verbally contracted for safety. Will continue to monitor.

## 2022-07-03 ENCOUNTER — Ambulatory Visit: Payer: Medicaid Other

## 2022-07-03 LAB — SARS CORONAVIRUS 2 BY RT PCR: SARS Coronavirus 2 by RT PCR: NEGATIVE

## 2022-07-03 MED ORDER — LORATADINE 10 MG PO TABS: 10.0000 mg | ORAL_TABLET | Freq: Every evening | ORAL | Status: DC

## 2022-07-03 MED ORDER — CLONIDINE HCL 0.1 MG PO TABS: 0.1000 mg | ORAL_TABLET | ORAL | Status: DC | PRN

## 2022-07-03 MED ORDER — GABAPENTIN 300 MG PO CAPS: 600.0000 mg | ORAL_CAPSULE | Freq: Three times a day (TID) | ORAL | 0 refills | Status: DC

## 2022-07-03 MED ORDER — TAMSULOSIN HCL 0.4 MG PO CAPS: 0.4000 mg | ORAL_CAPSULE | Freq: Every day | ORAL | 0 refills | Status: AC

## 2022-07-03 MED ORDER — AMLODIPINE BESYLATE 5 MG PO TABS: 5.0000 mg | ORAL_TABLET | Freq: Every day | ORAL | 0 refills | Status: DC

## 2022-07-03 MED ORDER — DIVALPROEX SODIUM ER 500 MG PO TB24: 1000.0000 mg | ORAL_TABLET | Freq: Every day | ORAL | 0 refills | Status: DC

## 2022-07-03 NOTE — Transportation (Signed)
07/03/2022  Jeremiah Hunter DOB: 11-18-1960 MRN: [id number]   RIDER WAIVER AND RELEASE OF LIABILITY  For the purposes of helping with transportation needs, [facility] partners with outside transportation providers (taxi companies, [place], Catering manager.) to give [employer] patients or other approved people the choice of on-demand rides [employer]") to our buildings for non-emergency visits.  By using [employer], I, the person signing this document, on behalf of myself and/or any legal minors (in my care using the [employer]), agree:  Science writer given to me are supplied by independent, outside transportation providers who do not work for, or have any affiliation with, [employer]. [name] is not a transportation company. [name] has no control over the quality or safety of the rides I get using [employer]. [name] has no control over whether any outside ride will happen on time or not. [name] gives no guarantee on the reliability, quality, safety, or availability on any rides, or that no mistakes will happen. I know and accept that traveling by vehicle (car, truck, SVU, Zenaida Niece, bus, taxi, etc.) has risks of serious injuries such as disability, being paralyzed, and death. I know and agree the risk of using [employer] is mine alone, and not [name]. Transport Services are provided "as is" and as are available. The transportation providers are in charge for all inspections and care of the vehicles used to provide these rides. I agree not to take legal action against [name], its agents, employees, officers, directors, representatives, insurers, attorneys, assigns, successors, subsidiaries, and affiliates at any time for any reasons related directly or indirectly to using [employer]. I also agree not to take legal action against [name] or its affiliates for any injury, death, or damage to property caused by or related to using  [employer]. I have read this Waiver and Release of Liability, and I understand the terms used in it and their legal meaning. This Waiver is freely and voluntarily given with the understanding that my right (or any legal minors) to legal action against  relating to [employer] is knowingly given up to use these services.   I attest that I read the Ride Waiver and Release of Liability to Jeremiah Hunter, gave [name] the opportunity to ask questions and answered the questions asked (if any). I affirm that [name] then provided consent for assistance with transportation.

## 2022-07-03 NOTE — Progress Notes (Signed)
  [facility] Adult Case Management Discharge Plan :  Will you be returning to the same living situation after discharge:  Yes,  Patient will be returning back to his home At discharge, do you have transportation home?: Yes,  CSW arranged a Taxi for patient  Do you have the ability to pay for your medications: Yes,  LME Medicaid [place]   Release of information consent forms completed and in the chart;  Patient's signature needed at discharge.  Patient to Follow up at:  Follow-up Information     [facility] Follow up on 07/03/2022.   Why: Your provider was made aware about your discharge and will follow up with you in the community @ 2:00 pm . [phone] Crisis line will pick up PROBABLY. Contact information: [address] [place] [address] [phone]                 Next level of care provider has access to [facility] Link:no  Safety Planning and Suicide Prevention discussed: Yes,  [place] with [employer] : [phone]     Has patient been referred to the Quitline?: Patient does not use tobacco/nicotine products; States that he has used in the past   Patient has been referred for addiction treatment: No known substance use disorder.  Isabella Bowens, LCSWA 07/03/2022, 10:37 AM

## 2022-07-03 NOTE — Discharge Instructions (Signed)
-  Follow-up with your outpatient psychiatric provider -instructions on appointment date, time, and address (location) are provided to you in discharge paperwork.  -Take your psychiatric medications as prescribed at discharge - instructions are provided to you in the discharge paperwork  -Follow-up with outpatient primary care doctor and other specialists -for management of preventative medicine and any chronic medical disease.  -Recommend abstinence from alcohol, tobacco, and other illicit drug use at discharge.   -If your psychiatric symptoms recur, worsen, or if you have side effects to your psychiatric medications, call your outpatient psychiatric provider, 911, [phone] or go to the nearest emergency department.  -If suicidal thoughts occur, call your outpatient psychiatric provider, 911, [phone] or go to the nearest emergency department.  Naloxone (Narcan) can help reverse an overdose when given to the victim quickly.  [place] offers free naloxone kits and instructions/training on its use.  Add naloxone to your first aid kit and you can help save a life.   Pick up your free kit at the following locations:   [place]:  [facility], [address] [place] [address] ([phone]) [facility] [address] [place] [id number] ([phone]) [facility] [address] [place] [address]  [place]: [facility] [address] [place] [address] ([phone]) [facility] [address] [place]  [address] ([phone])  

## 2022-07-03 NOTE — Group Note (Signed)
Recreation Therapy Group Note   Group Topic:Animal Assisted Therapy   Group Date: 07/03/2022 Start Time: 0945 End Time: 1030 Facilitators: Auren Valdes-McCall, LRT,CTRS Location: [address] Dayroom   Animal-Assisted Activity (AAA) Program Checklist/Progress Notes Patient Eligibility Criteria Checklist & Daily Group note for Rec Tx Intervention  AAA/T Program Assumption of Risk Form signed by Patient/ or Parent Legal Guardian Yes  Patient understands his/her participation is voluntary Yes   Affect/Mood: N/A   Participation Level: Did not attend    Clinical Observations/Individualized Feedback:     Plan: Continue to engage patient in RT group sessions 2-3x/week.   Romeo Zielinski-McCall, LRT,CTRS 07/03/2022 1:19 PM

## 2022-07-03 NOTE — Progress Notes (Signed)
Patient verbalizes readiness for discharge. All patient belongings returned to patient. Discharge instructions read and discussed with patient (appointments, medications, resources). Patient expressed gratitude for care provided. Patient discharged to lobby at 1315.
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
## 2022-07-03 NOTE — Group Note (Signed)
LCSW Group Therapy Note   Group Date: 07/03/2022 Start Time: 1100 End Time: 1200   Type of Therapy and Topic:  Group Therapy: Therapy Goals   Participation Level:  Did Not Attend     Description of Group: In this process group, patients discussed using strengths to work toward goals and address challenges.  Patients identified two positive things about themselves and one goal they were working on.  Patients were given the opportunity to share openly and support each other's plan for self-empowerment.  The group discussed the value of gratitude and were encouraged to have a daily reflection of positive characteristics or circumstances.  Patients were encouraged to identify a plan to utilize their strengths to work on current challenges and goals.   Therapeutic Goals Patient will verbalize personal strengths/positive qualities and relate how these can assist with achieving desired personal goals Patients will verbalize affirmation of peers plans for personal change and goal setting Patients will explore the value of gratitude and positive focus as related to successful achievement of goals Patients will verbalize a plan for regular reinforcement of personal positive qualities and circumstances.   Summary of Patient Progress: Did Not Attend        Therapeutic Modalities Cognitive Behavioral Therapy Motivational Interviewing       Isabella Bowens, LCSWA 07/03/2022  1:50 PM
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
## 2022-07-10 ENCOUNTER — Ambulatory Visit: Payer: Medicaid Other | Attending: Critical Care Medicine

## 2022-07-10 DIAGNOSIS — M6281 Muscle weakness (generalized): Secondary | ICD-10-CM | POA: Insufficient documentation

## 2022-07-10 DIAGNOSIS — M25512 Pain in left shoulder: Secondary | ICD-10-CM | POA: Insufficient documentation
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
## 2022-07-16 ENCOUNTER — Ambulatory Visit (HOSPITAL_COMMUNITY): Payer: Medicaid Other | Admitting: Psychiatry

## 2022-07-17 ENCOUNTER — Ambulatory Visit: Payer: Medicaid Other

## 2022-07-19 ENCOUNTER — Other Ambulatory Visit: Payer: Self-pay | Admitting: Critical Care Medicine

## 2022-07-24 ENCOUNTER — Ambulatory Visit: Payer: MEDICAID | Attending: Critical Care Medicine

## 2022-07-24 DIAGNOSIS — M25512 Pain in left shoulder: Secondary | ICD-10-CM | POA: Insufficient documentation

## 2022-07-24 DIAGNOSIS — M6281 Muscle weakness (generalized): Secondary | ICD-10-CM | POA: Insufficient documentation
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
## 2022-07-27 ENCOUNTER — Encounter: Payer: Self-pay | Admitting: Critical Care Medicine
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
## 2022-07-31 ENCOUNTER — Encounter: Payer: Self-pay | Admitting: Critical Care Medicine

## 2022-07-31 ENCOUNTER — Ambulatory Visit: Payer: MEDICAID | Attending: Critical Care Medicine | Admitting: Critical Care Medicine

## 2022-07-31 ENCOUNTER — Ambulatory Visit: Payer: MEDICAID

## 2022-07-31 VITALS — BP 115/70 | HR 73 | Wt 204.6 lb

## 2022-07-31 DIAGNOSIS — R296 Repeated falls: Secondary | ICD-10-CM | POA: Insufficient documentation

## 2022-07-31 DIAGNOSIS — F250 Schizoaffective disorder, bipolar type: Secondary | ICD-10-CM | POA: Insufficient documentation

## 2022-07-31 DIAGNOSIS — G40909 Epilepsy, unspecified, not intractable, without status epilepticus: Secondary | ICD-10-CM | POA: Insufficient documentation

## 2022-07-31 DIAGNOSIS — I10 Essential (primary) hypertension: Secondary | ICD-10-CM | POA: Diagnosis not present

## 2022-07-31 DIAGNOSIS — K120 Recurrent oral aphthae: Secondary | ICD-10-CM | POA: Insufficient documentation

## 2022-07-31 DIAGNOSIS — Z760 Encounter for issue of repeat prescription: Secondary | ICD-10-CM | POA: Insufficient documentation

## 2022-07-31 DIAGNOSIS — K642 Third degree hemorrhoids: Secondary | ICD-10-CM | POA: Insufficient documentation

## 2022-07-31 DIAGNOSIS — M25512 Pain in left shoulder: Secondary | ICD-10-CM

## 2022-07-31 DIAGNOSIS — K648 Other hemorrhoids: Secondary | ICD-10-CM | POA: Insufficient documentation

## 2022-07-31 DIAGNOSIS — F319 Bipolar disorder, unspecified: Secondary | ICD-10-CM | POA: Insufficient documentation

## 2022-07-31 DIAGNOSIS — R45850 Homicidal ideations: Secondary | ICD-10-CM

## 2022-07-31 DIAGNOSIS — R450 Nervousness: Secondary | ICD-10-CM | POA: Insufficient documentation

## 2022-07-31 DIAGNOSIS — E559 Vitamin D deficiency, unspecified: Secondary | ICD-10-CM | POA: Insufficient documentation

## 2022-07-31 DIAGNOSIS — G4730 Sleep apnea, unspecified: Secondary | ICD-10-CM | POA: Diagnosis not present

## 2022-07-31 DIAGNOSIS — F314 Bipolar disorder, current episode depressed, severe, without psychotic features: Secondary | ICD-10-CM

## 2022-07-31 DIAGNOSIS — Z046 Encounter for general psychiatric examination, requested by authority: Secondary | ICD-10-CM

## 2022-07-31 DIAGNOSIS — Z79899 Other long term (current) drug therapy: Secondary | ICD-10-CM | POA: Insufficient documentation

## 2022-07-31 DIAGNOSIS — M6281 Muscle weakness (generalized): Secondary | ICD-10-CM

## 2022-07-31 MED ORDER — VITAMIN D (ERGOCALCIFEROL) 1.25 MG (50000 UNIT) PO CAPS: 50000.0000 [IU] | ORAL_CAPSULE | ORAL | 2 refills | Status: DC

## 2022-07-31 MED ORDER — AZELASTINE-FLUTICASONE 137-50 MCG/ACT NA SUSP: 2.0000 | Freq: Every day | NASAL | 3 refills | Status: DC

## 2022-07-31 MED ORDER — AMLODIPINE BESYLATE 5 MG PO TABS: 5.0000 mg | ORAL_TABLET | Freq: Every day | ORAL | 0 refills | Status: DC

## 2022-07-31 MED ORDER — CROMOLYN SODIUM 4 % OP SOLN: 1.0000 [drp] | Freq: Every day | OPHTHALMIC | 5 refills | Status: DC

## 2022-07-31 MED ORDER — GABAPENTIN 300 MG PO CAPS: 600.0000 mg | ORAL_CAPSULE | Freq: Three times a day (TID) | ORAL | 0 refills | Status: DC

## 2022-07-31 MED ORDER — DIVALPROEX SODIUM ER 500 MG PO TB24: 1000.0000 mg | ORAL_TABLET | Freq: Every day | ORAL | 1 refills | Status: DC

## 2022-07-31 MED ORDER — LEVOCETIRIZINE DIHYDROCHLORIDE 5 MG PO TABS: 5.0000 mg | ORAL_TABLET | Freq: Every evening | ORAL | 5 refills | Status: DC

## 2022-07-31 MED ORDER — FLUTICASONE PROPIONATE 50 MCG/ACT NA SUSP: 2.0000 | Freq: Every day | NASAL | 6 refills | Status: DC

## 2022-07-31 MED ORDER — CHLORHEXIDINE GLUCONATE 0.12 % MT SOLN: 15.0000 mL | Freq: Two times a day (BID) | OROMUCOSAL | 0 refills | Status: DC

## 2022-07-31 NOTE — Assessment & Plan Note (Signed)
Resolved

## 2022-07-31 NOTE — Assessment & Plan Note (Signed)
Will make formal referral to surgery

## 2022-07-31 NOTE — Patient Instructions (Signed)
REturn 4 months  All medications reordered Use mouth wash ordered Referral for hemorrhoid sent

## 2022-07-31 NOTE — Assessment & Plan Note (Signed)
Hypertension at goal continue amlodipine 

## 2022-07-31 NOTE — Assessment & Plan Note (Signed)
No further falls using cane
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
## 2022-07-31 NOTE — Assessment & Plan Note (Signed)
As per above assessments 

## 2022-07-31 NOTE — Assessment & Plan Note (Signed)
Give oral Peridex

## 2022-07-31 NOTE — Assessment & Plan Note (Signed)
Continue with vitamin-D supplement.

## 2022-07-31 NOTE — Assessment & Plan Note (Signed)
Has mental health follow-up continue with Depakote 1000 mg daily

## 2022-07-31 NOTE — Assessment & Plan Note (Signed)
No recurrence will monitor 

## 2022-08-01 ENCOUNTER — Telehealth: Payer: Self-pay

## 2022-08-01 NOTE — Telephone Encounter (Signed)
Patient stated that he is going to get surgery, he inform his therapist already

## 2022-08-03 ENCOUNTER — Telehealth: Payer: Self-pay

## 2022-08-03 DIAGNOSIS — K642 Third degree hemorrhoids: Secondary | ICD-10-CM

## 2022-08-03 NOTE — Telephone Encounter (Signed)
Copied from CRM [id number]. Topic: Referral - Request for Referral >> Aug 03, 2022 10:58 AM Patsy Lager T wrote: Reason for CRM: patient called stated he needed a different referral as [facility] no longer accepts Medicaid  Has patient seen PCP for this complaint? Yes.   *If NO, is insurance requiring patient see PCP for this issue before PCP can refer them? Referral for which specialty: [facility] Preferred provider/office: unknown Reason for referral: Hemorrhoid prolapse

## 2022-08-05 NOTE — Addendum Note (Signed)
Addended by: Storm Frisk on: 08/05/2022 09:18 AM   Modules accepted: Orders

## 2022-08-05 NOTE — Telephone Encounter (Signed)
Let pt know I sent another referral to [facility] a [facility] practice

## 2022-08-07 ENCOUNTER — Ambulatory Visit: Payer: MEDICAID

## 2022-08-07 NOTE — Telephone Encounter (Signed)
Called patient and he is aware, they have called to scheduled already

## 2022-08-10 ENCOUNTER — Encounter: Payer: Self-pay | Admitting: Nurse Practitioner

## 2022-08-10 ENCOUNTER — Ambulatory Visit: Payer: MEDICAID | Attending: Nurse Practitioner | Admitting: Nurse Practitioner

## 2022-08-10 VITALS — BP 117/73 | HR 67 | Ht 71.0 in | Wt 204.4 lb

## 2022-08-10 DIAGNOSIS — G4762 Sleep related leg cramps: Secondary | ICD-10-CM

## 2022-08-10 MED ORDER — CYCLOBENZAPRINE HCL 5 MG PO TABS
5.0000 mg | ORAL_TABLET | Freq: Every evening | ORAL | 0 refills | Status: DC
Start: 2022-08-10 — End: 2022-09-04
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
## 2022-08-13 ENCOUNTER — Ambulatory Visit: Payer: Self-pay

## 2022-08-13 ENCOUNTER — Encounter: Payer: Self-pay | Admitting: Critical Care Medicine

## 2022-08-13 NOTE — Telephone Encounter (Signed)
Chief Complaint: Urine discomfort Symptoms: Odor, bubbles in urine Frequency: 2 months off and on, not all the time Pertinent Negatives: Patient denies other symptoms Disposition: [] ED /[] Urgent Care (no appt availability in office) / [x] Appointment(In office/virtual)/ []  [place] [facility]/ [] Home Care/ [] Refused Recommended Disposition /[] [place] [facility]/ []  Follow-up with PCP Additional Notes: Advised OV to discuss issues, no availability until 08/18/22, he says he will need 3 day notice for transportation. Advised first available on Saturday, 08/18/22, he agrees, scheduled on Saturday 08/18/22.    Summary: urine bubbles/spots on body   Pt states for the last two months he has had foaming bubbles in his urine and he can smell his urine. Pt states for the last two days when he goes to urinate, it is a straining feeling and he is feeling uncomfortable when he goes to the bathroom. Pt states that he has noticed spots on his body that looks like bruising spots. He spoke with his PCP about this at his last visit and his PCP asked him if he has hit the wall, pt states that he has not hit the wall. Pt is wanting to know if he need to be tested for cancer and would like to be tested for cancer. Please advise.     Reason for Disposition  All other males with painful urination  Answer Assessment - Initial Assessment Questions 1. SEVERITY: "How bad is the pain?"  (e.g., Scale 1-10; mild, moderate, or severe)   - MILD (1-3): Complains slightly about urination hurting.   - MODERATE (4-7): Interferes with normal activities.     - SEVERE (8-10): Excruciating, unwilling or unable to urinate because of the pain.      Mild  2. FREQUENCY: "How many times have you had painful urination today?"      Doesn't happen all the time 3. PATTERN: "Is pain present every time you urinate or just sometimes?"      Sometimes 4. ONSET: "When did the painful urination start?"      A couple of months ago  noticed 5. FEVER: "Do you have a fever?" If Yes, ask: "What is your temperature, how was it measured, and when did it start?"     No 6. PAST UTI: "Have you had a urine infection before?" If Yes, ask: "When was the last time?" and "What happened that time?"      Yes 7. CAUSE: "What do you think is causing the painful urination?"      Unsure 8. OTHER SYMPTOMS: "Do you have any other symptoms?" (e.g., flank pain, penis discharge, scrotal pain, blood in urine)     No  Protocols used: Urination Pain - Male-A-AH

## 2022-08-14 ENCOUNTER — Ambulatory Visit: Payer: MEDICAID

## 2022-08-17 ENCOUNTER — Ambulatory Visit: Payer: Self-pay

## 2022-08-17 NOTE — Telephone Encounter (Signed)
Message from [facility] C sent at 08/17/2022  3:13 PM EDT  Summary: dietary concern   The patient would like to know about the impact of taking 1 tsp of Cardamom  in their coffee and tee daily  Please contact further when possible  ----- Message from [facility] C sent at 08/17/2022  3:13 PM EDT ----- The patient would like to know about the impact of taking 1 tsp of Cardamom  in their coffee and tee daily  Please contact further when possible         Chief Complaint: wanting to know health benefit of adding cardamom to tea or coffee and if there is any drug interactions with his meds.  Disposition: [] ED /[] Urgent Care (no appt availability in office) / [] Appointment(In office/virtual)/ []  [place] [facility]/ [] Home Care/ [] Refused Recommended Disposition /[] [place] [facility]/ [x]  Follow-up with PCP Additional Notes: -  Reason for Disposition  [1] Caller requesting NON-URGENT health information AND [2] PCP's office is the best resource  Answer Assessment - Initial Assessment Questions 1. REASON FOR CALL or QUESTION: "What is your reason for calling today?" or "How can I best help you?" or "What question do you have that I can help answer?"     Message from [facility] C sent at 08/17/2022  3:13 PM EDT  Summary: dietary concern   The patient would like to know about the impact of taking 1 tsp of Cardamom  in their coffee and tee daily  Please contact further when possible  ----- Message from [facility] C sent at 08/17/2022  3:13 PM EDT ----- The patient would like to know about the impact of taking 1 tsp of Cardamom  in their coffee and tee daily  Please contact further when possible  Protocols used: Information Only Call - No Triage-A-AH

## 2022-08-18 ENCOUNTER — Ambulatory Visit: Payer: MEDICAID | Admitting: Family

## 2022-08-20 ENCOUNTER — Ambulatory Visit: Payer: MEDICAID | Admitting: Family

## 2022-08-20 NOTE — Telephone Encounter (Signed)
Spoke with patient . Verified name & DOB   Advised that for him that Cardamom safe to take. He is not on any blood thinners with could be affected by Cardamom. Otherwise the supplement is ok. Advised patient that I have also spoken with our pharmacist and they are also in agreement.

## 2022-08-21 ENCOUNTER — Encounter: Payer: Self-pay | Admitting: Critical Care Medicine

## 2022-08-21 ENCOUNTER — Ambulatory Visit (HOSPITAL_COMMUNITY): Payer: Medicaid Other | Admitting: Mental Health
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
## 2022-09-04 ENCOUNTER — Ambulatory Visit: Payer: MEDICAID | Attending: Family | Admitting: Critical Care Medicine

## 2022-09-04 ENCOUNTER — Encounter: Payer: Self-pay | Admitting: Critical Care Medicine

## 2022-09-04 ENCOUNTER — Telehealth: Payer: Self-pay

## 2022-09-04 VITALS — BP 127/87 | HR 84 | Wt 201.6 lb

## 2022-09-04 DIAGNOSIS — K642 Third degree hemorrhoids: Secondary | ICD-10-CM

## 2022-09-04 DIAGNOSIS — Z8781 Personal history of (healed) traumatic fracture: Secondary | ICD-10-CM

## 2022-09-04 DIAGNOSIS — I10 Essential (primary) hypertension: Secondary | ICD-10-CM | POA: Diagnosis not present

## 2022-09-04 DIAGNOSIS — G8929 Other chronic pain: Secondary | ICD-10-CM

## 2022-09-04 DIAGNOSIS — H60393 Other infective otitis externa, bilateral: Secondary | ICD-10-CM

## 2022-09-04 DIAGNOSIS — E559 Vitamin D deficiency, unspecified: Secondary | ICD-10-CM

## 2022-09-04 DIAGNOSIS — M25512 Pain in left shoulder: Secondary | ICD-10-CM | POA: Diagnosis not present

## 2022-09-04 DIAGNOSIS — G4762 Sleep related leg cramps: Secondary | ICD-10-CM

## 2022-09-04 DIAGNOSIS — H6090 Unspecified otitis externa, unspecified ear: Secondary | ICD-10-CM | POA: Insufficient documentation

## 2022-09-04 DIAGNOSIS — R296 Repeated falls: Secondary | ICD-10-CM

## 2022-09-04 DIAGNOSIS — F250 Schizoaffective disorder, bipolar type: Secondary | ICD-10-CM

## 2022-09-04 DIAGNOSIS — Z91048 Other nonmedicinal substance allergy status: Secondary | ICD-10-CM

## 2022-09-04 MED ORDER — CYCLOBENZAPRINE HCL 5 MG PO TABS
5.0000 mg | ORAL_TABLET | Freq: Every evening | ORAL | 1 refills | Status: DC
Start: 2022-09-04 — End: 2022-12-03

## 2022-09-04 MED ORDER — AZELASTINE-FLUTICASONE 137-50 MCG/ACT NA SUSP: 2.0000 | Freq: Every day | NASAL | 3 refills | Status: DC

## 2022-09-04 MED ORDER — CIPROFLOXACIN-DEXAMETHASONE 0.3-0.1 % OT SUSP: 4.0000 [drp] | Freq: Two times a day (BID) | OTIC | 0 refills | Status: DC

## 2022-09-04 MED ORDER — DIVALPROEX SODIUM ER 500 MG PO TB24: 1000.0000 mg | ORAL_TABLET | Freq: Every day | ORAL | 2 refills | Status: AC

## 2022-09-04 MED ORDER — AMLODIPINE BESYLATE 5 MG PO TABS: 5.0000 mg | ORAL_TABLET | Freq: Every day | ORAL | 2 refills | Status: DC

## 2022-09-04 MED ORDER — VITAMIN D (ERGOCALCIFEROL) 1.25 MG (50000 UNIT) PO CAPS: 50000.0000 [IU] | ORAL_CAPSULE | ORAL | 4 refills | Status: DC

## 2022-09-04 MED ORDER — GABAPENTIN 300 MG PO CAPS: 600.0000 mg | ORAL_CAPSULE | Freq: Three times a day (TID) | ORAL | 2 refills | Status: DC

## 2022-09-04 MED ORDER — CLINDAMYCIN PHOS-BENZOYL PEROX 1-5 % EX GEL: Freq: Two times a day (BID) | CUTANEOUS | 0 refills | Status: DC

## 2022-09-04 MED ORDER — LEVOCETIRIZINE DIHYDROCHLORIDE 5 MG PO TABS: 5.0000 mg | ORAL_TABLET | Freq: Every evening | ORAL | 5 refills | Status: DC

## 2022-09-04 MED ORDER — CROMOLYN SODIUM 4 % OP SOLN: 1.0000 [drp] | Freq: Every day | OPHTHALMIC | 5 refills | Status: DC

## 2022-09-04 NOTE — Assessment & Plan Note (Signed)
Now has a new apartment

## 2022-09-04 NOTE — Telephone Encounter (Signed)
Copied from CRM [id number]. Topic: General - Inquiry >> Sep 04, 2022  2:39 PM Haroldine Laws wrote: Reason for CRM: pt called saying the pharmacy told him to call the office back because insurance was not wanting to pay for the rash cream.  He said Dr. Delford Field may have to contact them.

## 2022-09-04 NOTE — Patient Instructions (Signed)
Physical therapy will be ordered for left shoulder  Labs today  All medications refilled  Ear drops both ears twice daily for external ear infection  Cream for anterior chest rash  Hold off on surgery referral   Gilmer Mor was issued  Return 4 months new primary care provider at clinic

## 2022-09-04 NOTE — Assessment & Plan Note (Signed)
Due to clavicular fracture refer back to physical therapy

## 2022-09-04 NOTE — Assessment & Plan Note (Signed)
Improved provided a cane

## 2022-09-04 NOTE — Assessment & Plan Note (Signed)
Ciprodex given

## 2022-09-04 NOTE — Assessment & Plan Note (Signed)
Continue with ACT team and continue Depakote

## 2022-09-04 NOTE — Assessment & Plan Note (Signed)
Monitor for now

## 2022-09-04 NOTE — Assessment & Plan Note (Signed)
Recheck vitamin D levels

## 2022-09-05 ENCOUNTER — Telehealth: Payer: Self-pay

## 2022-09-05 LAB — VITAMIN D 25 HYDROXY (VIT D DEFICIENCY, FRACTURES): Vit D, 25-Hydroxy: 51.7 ng/mL (ref 30.0–100.0)

## 2022-09-05 LAB — COMPREHENSIVE METABOLIC PANEL WITH GFR
ALT: 16 IU/L (ref 0–44)
AST: 22 IU/L (ref 0–40)
Albumin: 4.5 g/dL (ref 3.8–4.9)
Alkaline Phosphatase: 131 IU/L — ABNORMAL HIGH (ref 44–121)
BUN/Creatinine Ratio: 12 (ref 10–24)
BUN: 13 mg/dL (ref 8–27)
Bilirubin Total: 0.4 mg/dL (ref 0.0–1.2)
CO2: 23 mmol/L (ref 20–29)
Calcium: 9.5 mg/dL (ref 8.6–10.2)
Chloride: 104 mmol/L (ref 96–106)
Creatinine, Ser: 1.12 mg/dL (ref 0.76–1.27)
Globulin, Total: 2.7 g/dL (ref 1.5–4.5)
Glucose: 83 mg/dL (ref 70–99)
Potassium: 4.3 mmol/L (ref 3.5–5.2)
Sodium: 141 mmol/L (ref 134–144)
Total Protein: 7.2 g/dL (ref 6.0–8.5)
eGFR: 75 mL/min/1.73

## 2022-09-05 MED ORDER — MUPIROCIN 2 % EX OINT: 1.0000 | TOPICAL_OINTMENT | Freq: Two times a day (BID) | CUTANEOUS | 0 refills | Status: DC

## 2022-09-05 NOTE — Telephone Encounter (Signed)
I sent a different cream that likely will be covered

## 2022-09-05 NOTE — Progress Notes (Signed)
Let pt know all labs normal  vitamin D normal

## 2022-09-05 NOTE — Addendum Note (Signed)
Addended by: Shan Levans E on: 09/05/2022 03:14 PM   Modules accepted: Orders

## 2022-09-05 NOTE — Telephone Encounter (Signed)
-----   Message from Shan Levans sent at 09/05/2022  6:16 AM EDT ----- Let pt know all labs normal  vitamin D normal

## 2022-09-05 NOTE — Telephone Encounter (Signed)
Pt was called and is aware of results, DOB was confirmed.  ?

## 2022-09-06 NOTE — Telephone Encounter (Signed)
Called patient and he is aware and was also wondering  if his ears were ok( Not sure if he mentioned that in last visit )

## 2022-09-06 NOTE — Telephone Encounter (Signed)
Let his ears normal

## 2022-09-07 ENCOUNTER — Other Ambulatory Visit: Payer: Self-pay

## 2022-09-10 ENCOUNTER — Telehealth: Payer: Self-pay

## 2022-09-10 NOTE — Telephone Encounter (Signed)
Copied from CRM [id number]. Topic: General - Other >> Sep 10, 2022  8:17 AM Jeremiah Hunter wrote: Reason for CRM: Pt called requesting to speak to the clinic about questions he has for his PCP regarding recent visits.

## 2022-09-11 ENCOUNTER — Other Ambulatory Visit: Payer: Self-pay

## 2022-09-11 MED ORDER — MUPIROCIN 2 % EX OINT: 1.0000 | TOPICAL_OINTMENT | Freq: Two times a day (BID) | CUTANEOUS | 0 refills | Status: DC

## 2022-09-11 NOTE — Telephone Encounter (Signed)
Called patient he stated that rash medication ws not at his pharmacy, reviewed chart  medication was sent to the wrong pharmacy and it was reordered

## 2022-09-13 ENCOUNTER — Ambulatory Visit: Payer: MEDICAID | Attending: Critical Care Medicine

## 2022-09-13 DIAGNOSIS — M25512 Pain in left shoulder: Secondary | ICD-10-CM | POA: Insufficient documentation

## 2022-09-13 DIAGNOSIS — G8929 Other chronic pain: Secondary | ICD-10-CM | POA: Diagnosis not present

## 2022-09-13 DIAGNOSIS — M6281 Muscle weakness (generalized): Secondary | ICD-10-CM | POA: Diagnosis present

## 2022-09-13 DIAGNOSIS — Z8781 Personal history of (healed) traumatic fracture: Secondary | ICD-10-CM | POA: Insufficient documentation
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
## 2022-09-20 ENCOUNTER — Encounter: Payer: Self-pay | Admitting: Critical Care Medicine
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
## 2022-09-25 ENCOUNTER — Encounter: Payer: Self-pay | Admitting: Physical Therapy

## 2022-09-25 ENCOUNTER — Ambulatory Visit: Payer: MEDICAID | Attending: Critical Care Medicine | Admitting: Physical Therapy

## 2022-09-25 DIAGNOSIS — M25512 Pain in left shoulder: Secondary | ICD-10-CM | POA: Insufficient documentation

## 2022-09-25 DIAGNOSIS — M6281 Muscle weakness (generalized): Secondary | ICD-10-CM | POA: Diagnosis present
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
## 2022-09-27 ENCOUNTER — Ambulatory Visit: Payer: MEDICAID | Admitting: Physical Therapy

## 2022-09-27 ENCOUNTER — Encounter: Payer: Self-pay | Admitting: Physical Therapy

## 2022-09-27 DIAGNOSIS — M25512 Pain in left shoulder: Secondary | ICD-10-CM | POA: Diagnosis not present

## 2022-09-27 DIAGNOSIS — M6281 Muscle weakness (generalized): Secondary | ICD-10-CM

## 2022-10-02 ENCOUNTER — Encounter: Payer: Self-pay | Admitting: Physical Therapy

## 2022-10-02 ENCOUNTER — Ambulatory Visit: Payer: MEDICAID | Admitting: Physical Therapy

## 2022-10-02 ENCOUNTER — Other Ambulatory Visit: Payer: Self-pay | Admitting: Critical Care Medicine

## 2022-10-02 DIAGNOSIS — M25512 Pain in left shoulder: Secondary | ICD-10-CM

## 2022-10-02 DIAGNOSIS — M6281 Muscle weakness (generalized): Secondary | ICD-10-CM

## 2022-10-02 NOTE — Telephone Encounter (Signed)
Medication Refill - Medication: cromolyn (OPTICROM) 4 % ophthalmic solution  Has the patient contacted their pharmacy? No.  Preferred Pharmacy (with phone number or street name):  [id number] - [place], [place] - [address] Phone: [phone]  Fax: [phone]     Has the patient been seen for an appointment in the last year OR does the patient have an upcoming appointment? Yes.    Agent: Please be advised that RX refills may take up to 3 business days. We ask that you follow-up with your pharmacy.

## 2022-10-02 NOTE — Telephone Encounter (Signed)
Medication Refill - Medication: cromolyn (OPTICROM) 4 % ophthalmic solution   Has the patient contacted their pharmacy? No.   Preferred Pharmacy (with phone number or street name):  [id number] - [place], [place] - [address] Phone: [phone]  Fax: [phone]      Has the patient been seen for an appointment in the last year OR does the patient have an upcoming appointment? Yes.     Agent: Please be advised that RX refills may take up to 3 business days. We ask that you follow-up with your pharmacy.
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
## 2022-10-04 ENCOUNTER — Encounter: Payer: Self-pay | Admitting: Physical Therapy

## 2022-10-04 ENCOUNTER — Encounter: Payer: Self-pay | Admitting: Allergy

## 2022-10-04 ENCOUNTER — Telehealth: Payer: Self-pay | Admitting: Critical Care Medicine

## 2022-10-04 ENCOUNTER — Other Ambulatory Visit: Payer: Self-pay

## 2022-10-04 ENCOUNTER — Ambulatory Visit: Payer: MEDICAID | Admitting: Physical Therapy

## 2022-10-04 DIAGNOSIS — M6281 Muscle weakness (generalized): Secondary | ICD-10-CM

## 2022-10-04 DIAGNOSIS — M25512 Pain in left shoulder: Secondary | ICD-10-CM

## 2022-10-04 MED ORDER — FLUTICASONE PROPIONATE 50 MCG/ACT NA SUSP: 2.0000 | Freq: Every day | NASAL | 5 refills | Status: DC

## 2022-10-04 MED ORDER — AZELASTINE-FLUTICASONE 137-50 MCG/ACT NA SUSP: NASAL | 2 refills | Status: DC

## 2022-10-04 MED ORDER — AZELASTINE-FLUTICASONE 137-50 MCG/ACT NA SUSP: 2.0000 | Freq: Every day | NASAL | 2 refills | Status: DC

## 2022-10-04 MED ORDER — AZELASTINE-FLUTICASONE 137-50 MCG/ACT NA SUSP: 2.0000 | Freq: Every day | NASAL | 1 refills | Status: DC

## 2022-10-04 MED ORDER — AZELASTINE HCL 0.1 % NA SOLN: 2.0000 | Freq: Two times a day (BID) | NASAL | 5 refills | Status: DC

## 2022-10-04 MED ORDER — CROMOLYN SODIUM 4 % OP SOLN: 1.0000 [drp] | Freq: Every day | OPHTHALMIC | 3 refills | Status: DC

## 2022-10-04 NOTE — Addendum Note (Signed)
Addended by: Areta Haber B on: 10/04/2022 09:44 AM   Modules accepted: Orders

## 2022-10-04 NOTE — Telephone Encounter (Signed)
Requested Prescriptions  Pending Prescriptions Disp Refills   cromolyn (OPTICROM) 4 % ophthalmic solution 1 mL 3    Sig: Place 1 drop into both eyes daily.     Ophthalmology:  Quincy Carnes - 10/02/2022  3:46 PM      Passed - Valid encounter within last 12 months    Recent Outpatient Visits           1 month ago Essential hypertension   [place] [facility] Storm Frisk, MD   1 month ago Nocturnal leg cramps   [facility] [place] [place] W, NP   2 months ago Bipolar disorder, current episode depressed, severe, without psychotic features [facility])   [facility] Storm Frisk, MD   5 months ago Displaced fracture of lateral end of left clavicle, initial encounter for closed fracture   [facility] Storm Frisk, MD   7 months ago Essential hypertension    [facility] Storm Frisk, MD       Future Appointments             In 2 months Hoy Register, MD [facility]

## 2022-10-04 NOTE — Telephone Encounter (Signed)
Medication Refill - Medication: dymista   Has the patient contacted their pharmacy? yes (Agent: If no, request that the patient contact the pharmacy for the refill. If patient does not wish to contact the pharmacy document the reason why and proceed with request.) (Agent: If yes, when and what did the pharmacy advise?)contact pcp  Preferred Pharmacy (with phone number or street name):  [id number] - [place], [place] - [address] Phone: [phone]  Fax: [phone]     Has the patient been seen for an appointment in the last year OR does the patient have an upcoming appointment? yes  Agent: Please be advised that RX refills may take up to 3 business days. We ask that you follow-up with your pharmacy.

## 2022-10-04 NOTE — Telephone Encounter (Signed)
[facility] called and spoke to [facility] consultant about the refill(s) dymista requested. Advised it was sent on 10/04/22. She says it was received and placed on hold due to the insurance is not covering it and it will cost over $300. Patient called and advised to call the [facility] Allergy or send a MyChart message of the above and ask if they send something in that the insurance will pay for. He says he will.

## 2022-10-09 ENCOUNTER — Ambulatory Visit: Payer: MEDICAID | Admitting: Physical Therapy

## 2022-10-11 ENCOUNTER — Ambulatory Visit: Payer: MEDICAID

## 2022-10-13 ENCOUNTER — Encounter: Payer: Self-pay | Admitting: Critical Care Medicine

## 2022-10-15 ENCOUNTER — Other Ambulatory Visit: Payer: Self-pay | Admitting: Critical Care Medicine

## 2022-10-15 ENCOUNTER — Telehealth: Payer: Self-pay | Admitting: Critical Care Medicine

## 2022-10-15 MED ORDER — VITAMIN D (ERGOCALCIFEROL) 1.25 MG (50000 UNIT) PO CAPS: 50000.0000 [IU] | ORAL_CAPSULE | ORAL | 4 refills | Status: DC

## 2022-10-15 NOTE — Telephone Encounter (Signed)
Medication Refill - Medication: Vitamin D, Ergocalciferol, (DRISDOL) 1.25 MG (50000 UNIT) CAPS capsule   Has the patient contacted their pharmacy? No.  Preferred Pharmacy (with phone number or street name):  [id number] - [place], [place] - [address] Phone: [phone]  Fax: [phone]     Has the patient been seen for an appointment in the last year OR does the patient have an upcoming appointment? Yes.    Agent: Please be advised that RX refills may take up to 3 business days. We ask that you follow-up with your pharmacy.

## 2022-10-15 NOTE — Telephone Encounter (Signed)
Called patient and he is aware.

## 2022-10-15 NOTE — Telephone Encounter (Signed)
Let pt know vit D was refilled

## 2022-10-16 ENCOUNTER — Ambulatory Visit: Payer: MEDICAID | Admitting: Orthopaedic Surgery

## 2022-10-17 ENCOUNTER — Telehealth: Payer: Self-pay | Admitting: Critical Care Medicine

## 2022-10-17 NOTE — Telephone Encounter (Signed)
Patient called and stated he will need his Vitamin D, Ergocalciferol, (DRISDOL) 1.25 MG (50000 UNIT) CAPS capsule sent to a different pharmacy. Please send the script to  [id number] - [place], [place] - [address] AT [facility] [address] Phone: [phone]  Fax: [phone]

## 2022-10-24 ENCOUNTER — Encounter: Payer: Self-pay | Admitting: Allergy

## 2022-10-24 ENCOUNTER — Ambulatory Visit (INDEPENDENT_AMBULATORY_CARE_PROVIDER_SITE_OTHER): Payer: MEDICAID | Admitting: Allergy

## 2022-10-24 VITALS — BP 130/80 | HR 77 | Temp 98.1°F | Resp 18

## 2022-10-24 DIAGNOSIS — J3089 Other allergic rhinitis: Secondary | ICD-10-CM | POA: Diagnosis not present

## 2022-10-24 DIAGNOSIS — H1013 Acute atopic conjunctivitis, bilateral: Secondary | ICD-10-CM | POA: Diagnosis not present

## 2022-10-24 MED ORDER — DYMISTA 137-50 MCG/ACT NA SUSP
1.0000 | Freq: Two times a day (BID) | NASAL | 5 refills | Status: DC | PRN
Start: 2022-10-24 — End: 2023-01-21

## 2022-10-24 MED ORDER — CROMOLYN SODIUM 4 % OP SOLN: 1.0000 [drp] | Freq: Four times a day (QID) | OPHTHALMIC | 5 refills | Status: DC | PRN

## 2022-10-24 MED ORDER — LEVOCETIRIZINE DIHYDROCHLORIDE 5 MG PO TABS: 5.0000 mg | ORAL_TABLET | Freq: Every evening | ORAL | 5 refills | Status: DC

## 2022-10-24 NOTE — Progress Notes (Signed)
Follow-up Note  RE: Jeremiah Hunter MRN: [id number] DOB: 1960/10/29 Date of Office Visit: 10/24/2022   History of present illness: Jeremiah Hunter is a 62 y.o. male presenting today for follow-up of allergic rhinitis with conjunctivitis.  He was last seen in the office on 05/23/2022 by myself.  Discussed the use of AI scribe software for clinical note transcription with the patient, who gave verbal consent to proceed.  The patient presents with complaints of blurry vision and itchy eyes, which have been ongoing. The blurriness is a daily occurrence, but there is no associated discharge or crusting. The patient has been using cromolyn eye drops once a day, which he believes has been helpful, as this is what the instructions on bottle say.  He states he did receive the Dymista after last visit but states on refilling it was not provided and received 2 nasal sprays instead. He would prefer to have the 1 original spray.  The patient has also been taking an allergy pill, Levocetirizine, which he reports has reduced his sneezing.  The patient also reports a recent rash on the upper body, for which he received medication. The rash has since resolved.   The patient has a history of mold exposure, but moved to a new residence prior to this initial visit with me with no current mold issues. He also reports occasional earaches, but upon examination, no significant wax build-up was noted. The patient's primary care provider has previously performed ear flushes for wax removal.     Review of systems: 10pt ROS negative unless noted in the HPI  Past medical/social/surgical/family history have been reviewed and are unchanged unless specifically indicated below.  No changes  Medication List: Current Outpatient Medications  Medication Sig Dispense Refill   amLODipine (NORVASC) 5 MG tablet Take 1 tablet (5 mg total) by mouth daily. TAKE 1 TABLET(5 MG) BY MOUTH DAILY 90 tablet 2   azelastine (ASTELIN) 0.1 %  nasal spray Place 2 sprays into both nostrils 2 (two) times daily. Use in each nostril as directed 30 mL 5   cromolyn (OPTICROM) 4 % ophthalmic solution Place 1 drop into both eyes 4 (four) times daily as needed (Itchy, watery eyes). 10 mL 5   cyclobenzaprine (FLEXERIL) 5 MG tablet Take 1 tablet (5 mg total) by mouth at bedtime. For muscle cramps 30 tablet 1   divalproex (DEPAKOTE ER) 500 MG 24 hr tablet Take 2 tablets (1,000 mg total) by mouth daily. 120 tablet 2   DYMISTA 137-50 MCG/ACT SUSP Place 1 spray into both nostrils 2 (two) times daily as needed (Runny or stuffy nose). 23 g 5   fluticasone (FLONASE) 50 MCG/ACT nasal spray Place 2 sprays into both nostrils daily. 16 g 5   Vitamin D, Ergocalciferol, (DRISDOL) 1.25 MG (50000 UNIT) CAPS capsule Take 1 capsule (50,000 Units total) by mouth every Sunday. 12 capsule 4   cromolyn (OPTICROM) 4 % ophthalmic solution Place 1 drop into both eyes daily. (Patient not taking: Reported on 10/24/2022) 1 mL 3   gabapentin (NEURONTIN) 300 MG capsule Take 2 capsules (600 mg total) by mouth 3 (three) times daily. 180 capsule 2   levocetirizine (XYZAL) 5 MG tablet Take 1 tablet (5 mg total) by mouth every evening. 30 tablet 5   No current facility-administered medications for this visit.     Known medication allergies: No Known Allergies   Physical examination: Blood pressure 130/80, pulse 77, temperature 98.1 F (36.7 C), temperature source Temporal, resp. rate 18, SpO2 98%.  General: Alert, interactive, in no acute distress. HEENT: PERRLA, TMs pearly gray, turbinates minimally edematous without discharge, post-pharynx non erythematous. Neck: Supple without lymphadenopathy. Lungs: Clear to auscultation without wheezing, rhonchi or rales. {no increased work of breathing. CV: Normal S1, S2 without murmurs. Abdomen: Nondistended, nontender. Skin: Warm and dry, without lesions or rashes. Extremities:  No clubbing, cyanosis or edema. Neuro:   Grossly  intact.  Diagnositics/Labs: None today  Assessment and plan: Allergic rhinitis with conjunctivitis  - Continue avoidance measures for grasses, ragweed, weeds, trees, indoor molds, outdoor molds, dust mites, and cockroach. - Continue Xyzal (levocetirizine) 5mg  tablet once daily.   This is an antihistamine.  Dymista (fluticasone/azelastine) 1 spray per nostril 1-2 times daily as needed for runny or stuffy nose.   Cromolyn eye drop 1 drop each eye up to 4 times a day as needed for itchy/watery eyes.  - You can use an extra dose of the antihistamine, if needed, for breakthrough symptoms.  - Consider nasal saline rinses 1-2 times daily to remove allergens from the nasal cavities as well as help with mucous clearance (this is especially helpful to do before the nasal sprays are given) - Consider allergy shots as a means of long-term control. - Allergy shots "re-train" and "reset" the immune system to ignore environmental allergens and decrease the resulting immune response to those allergens (sneezing, itchy watery eyes, runny nose, nasal congestion, etc).    - Allergy shots improve symptoms in 75-85% of patients.  - We can discuss more at a future appointment if the medications are not working for you.  Follow-up in 6-9 months or sooner if needed.   I appreciate the opportunity to take part in Grady's care. Please do not hesitate to contact me with questions.  Sincerely,   Margo Aye, MD Allergy/Immunology [facility]

## 2022-10-24 NOTE — Patient Instructions (Addendum)
-   Continue avoidance measures for grasses, ragweed, weeds, trees, indoor molds, outdoor molds, dust mites, and cockroach. - Continue Xyzal (levocetirizine) 5mg  tablet once daily.   This is an antihistamine.  Dymista (fluticasone/azelastine) 1 spray per nostril 1-2 times daily as needed for runny or stuffy nose.   Cromolyn eye drop 1 drop each eye up to 4 times a day as needed for itchy/watery eyes.  - You can use an extra dose of the antihistamine, if needed, for breakthrough symptoms.  - Consider nasal saline rinses 1-2 times daily to remove allergens from the nasal cavities as well as help with mucous clearance (this is especially helpful to do before the nasal sprays are given) - Consider allergy shots as a means of long-term control. - Allergy shots "re-train" and "reset" the immune system to ignore environmental allergens and decrease the resulting immune response to those allergens (sneezing, itchy watery eyes, runny nose, nasal congestion, etc).    - Allergy shots improve symptoms in 75-85% of patients.  - We can discuss more at a future appointment if the medications are not working for you.  Follow-up in 6-9 months or sooner if needed.

## 2022-10-25 ENCOUNTER — Other Ambulatory Visit (INDEPENDENT_AMBULATORY_CARE_PROVIDER_SITE_OTHER): Payer: MEDICAID

## 2022-10-25 ENCOUNTER — Encounter: Payer: Self-pay | Admitting: Orthopaedic Surgery

## 2022-10-25 ENCOUNTER — Ambulatory Visit: Payer: MEDICAID | Admitting: Orthopaedic Surgery

## 2022-10-25 DIAGNOSIS — S42032A Displaced fracture of lateral end of left clavicle, initial encounter for closed fracture: Secondary | ICD-10-CM | POA: Diagnosis not present
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
## 2022-11-06 ENCOUNTER — Ambulatory Visit: Payer: MEDICAID | Admitting: Orthopaedic Surgery

## 2022-12-03 ENCOUNTER — Ambulatory Visit: Payer: MEDICAID | Attending: Family Medicine | Admitting: Family Medicine

## 2022-12-03 ENCOUNTER — Encounter: Payer: Self-pay | Admitting: Family Medicine

## 2022-12-03 VITALS — BP 133/91 | HR 92 | Ht 71.0 in | Wt 205.6 lb

## 2022-12-03 DIAGNOSIS — L299 Pruritus, unspecified: Secondary | ICD-10-CM

## 2022-12-03 DIAGNOSIS — G4762 Sleep related leg cramps: Secondary | ICD-10-CM

## 2022-12-03 DIAGNOSIS — Z1211 Encounter for screening for malignant neoplasm of colon: Secondary | ICD-10-CM

## 2022-12-03 DIAGNOSIS — Z125 Encounter for screening for malignant neoplasm of prostate: Secondary | ICD-10-CM

## 2022-12-03 DIAGNOSIS — M2550 Pain in unspecified joint: Secondary | ICD-10-CM

## 2022-12-03 DIAGNOSIS — R21 Rash and other nonspecific skin eruption: Secondary | ICD-10-CM | POA: Diagnosis not present

## 2022-12-03 DIAGNOSIS — I10 Essential (primary) hypertension: Secondary | ICD-10-CM

## 2022-12-03 MED ORDER — TRIAMCINOLONE ACETONIDE 0.1 % EX CREA: 1.0000 | TOPICAL_CREAM | Freq: Two times a day (BID) | CUTANEOUS | 1 refills | Status: DC

## 2022-12-03 MED ORDER — CYCLOBENZAPRINE HCL 5 MG PO TABS: 5.0000 mg | ORAL_TABLET | Freq: Every evening | ORAL | 1 refills | Status: DC

## 2022-12-03 MED ORDER — MELOXICAM 7.5 MG PO TABS: 7.5000 mg | ORAL_TABLET | Freq: Every day | ORAL | 1 refills | Status: DC

## 2022-12-03 MED ORDER — GABAPENTIN 300 MG PO CAPS: 600.0000 mg | ORAL_CAPSULE | Freq: Three times a day (TID) | ORAL | 2 refills | Status: DC

## 2022-12-03 NOTE — Patient Instructions (Signed)
Joint Pain  Joint pain can be caused by many things. It may go away if you follow instructions from your health care provider for taking care of yourself at home. Sometimes, you may need more treatment. Joint pain can be caused by: Bruises at the area of the joint. An injury caused by movements that are repeated. Wear and tear on the joint as you get older. Buildup of uric acid crystals in the joint. This is also called gout. Irritation and swelling of the joint. Types of arthritis. Infections of the joint or of the bone. Your provider may tell you to take pain medicine or wear an elastic bandage, sling, or splint. If your joint pain continues, you may need lab or imaging tests to find the cause of your joint pain. Follow these instructions at home: If you have an elastic bandage, sling, or splint that can be taken off: Wear the bandage, sling, or splint as told by your provider. Take it off only if your provider says you can. Check the skin under and around it every day. Tell your provider if you see problems. Loosen it if your fingers or toes tingle, are numb, or turn cold and blue. Keep it clean and dry. Ask your provider if you should remove it before bathing. If the bandage, sling, or splint is not waterproof: Do not let it get wet. Cover it when you take a bath or shower. Use a cover that does not let any water in. Managing pain, stiffness, and swelling     If told, put ice on the area. If you have an elastic bandage, sling, or splint that you can take off, remove it as told. Put ice in a plastic bag. Place a towel between your skin and the bag. Leave the ice on for 20 minutes, 2-3 times a day. If told, put heat on the area. Do this as often as told. Use the heat source that your provider recommends, such as a moist heat pack or a heating pad. Place a towel between your skin and the heat source. Leave the heat on for 20-30 minutes. If your skin turns bright red, take off the  ice or heat right away to prevent skin damage. The risk of damage is higher if you can't feel pain, heat, or cold. Move your fingers or toes often to reduce stiffness and swelling. Raise the injured area above the level of your heart while you're sitting or lying down. Use a pillow to support the painful area as needed. Activity Rest the painful joint as told. Do not do things that cause pain or make pain worse. Begin exercising or stretching the affected area as told by your provider. Return to normal activities when you are told. Ask what things are safe for you to do. General instructions Take your medicines as told by your provider. Treatment may include medicines for pain and swelling that are taken by mouth or applied to the skin. Do not smoke, vape, or use products with nicotine or tobacco in them. If you need help quitting, talk with your provider. Keep all follow-up visits. Your provider will want to check on your condition. Contact a health care provider if: You have pain that does not get better with medicine. Your joint pain does not improve within 3 days. You have more bruising or swelling. You have a fever. You lose 10 lb (4.5 kg) or more without trying. Get help right away if: You cannot move the joint. Your fingers  or toes tingle, become numb, or turn cold and blue. You have a fever along with a joint that's red, warm, and swollen. This information is not intended to replace advice given to you by your health care provider. Make sure you discuss any questions you have with your health care provider. Document Revised: 04/06/2022 Document Reviewed: 03/23/2022 Elsevier Patient Education  2024 [employer].
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
## 2022-12-06 LAB — BASIC METABOLIC PANEL
BUN/Creatinine Ratio: 16 (ref 10–24)
BUN: 18 mg/dL (ref 8–27)
CO2: 23 mmol/L (ref 20–29)
Calcium: 9.3 mg/dL (ref 8.6–10.2)
Chloride: 104 mmol/L (ref 96–106)
Creatinine, Ser: 1.14 mg/dL (ref 0.76–1.27)
Glucose: 85 mg/dL (ref 70–99)
Potassium: 4.4 mmol/L (ref 3.5–5.2)
Sodium: 141 mmol/L (ref 134–144)
eGFR: 74 mL/min/{1.73_m2} (ref >59)

## 2022-12-06 LAB — ANTI-DNA ANTIBODY, DOUBLE-STRANDED: dsDNA Ab: <1 [IU]/mL (ref 0–9)

## 2022-12-06 LAB — C-REACTIVE PROTEIN: CRP: 2 mg/L (ref 0–10)

## 2022-12-06 LAB — PSA, TOTAL AND FREE
PSA, Free Pct: 40 %
PSA, Free: 0.16 ng/mL
Prostate Specific Ag, Serum: 0.4 ng/mL (ref 0.0–4.0)

## 2022-12-06 LAB — ANA,IFA RA DIAG PNL W/RFLX TIT/PATN
ANA Titer 1: NEGATIVE
Cyclic Citrullin Peptide Ab: 9 U (ref 0–19)
Rheumatoid fact SerPl-aCnc: <10 [IU]/mL (ref <14.0)

## 2022-12-06 LAB — ANTI-SMITH ANTIBODY: ENA SM Ab Ser-aCnc: <0.2 AI (ref 0.0–0.9)

## 2022-12-06 LAB — SEDIMENTATION RATE: Sed Rate: 2 mm/h (ref 0–30)

## 2022-12-07 ENCOUNTER — Telehealth: Payer: Self-pay | Admitting: *Deleted

## 2022-12-07 ENCOUNTER — Encounter: Payer: Self-pay | Admitting: Gastroenterology

## 2022-12-07 DIAGNOSIS — K0889 Other specified disorders of teeth and supporting structures: Secondary | ICD-10-CM

## 2022-12-07 NOTE — Telephone Encounter (Signed)
Copied from CRM [id number]. Topic: General - Other >> Dec 07, 2022 11:48 AM Epimenio Foot F wrote: Reason for CRM: Pt is calling in because he would like a referral to see a dentist. Please follow up with pt.

## 2022-12-12 ENCOUNTER — Other Ambulatory Visit: Payer: Self-pay | Admitting: Critical Care Medicine

## 2022-12-12 NOTE — Telephone Encounter (Signed)
Medication Refill -  Most Recent Primary Care Visit:  Provider: Hoy Register  Department: [facility]  Visit Type: OFFICE VISIT  Date: 12/03/2022  Medication: amLODipine (NORVASC) 5 MG tablet   Pt requesting 90 day refill. Please advise.   Has the patient contacted their pharmacy? Yes  (Agent: If yes, when and what did the pharmacy advise?)  Is this the correct pharmacy for this prescription? Yes If no, delete pharmacy and type the correct one.  This is the patient's preferred pharmacy:   [id number] [place], [place] - [address] AT [facility] OF [address] & [facility] [address]  [address]  [place] [address]  Phone: [phone] Fax: [phone]  Hours: Not open 24 hours    Has the prescription been filled recently? Yes  Is the patient out of the medication? Yes  Has the patient been seen for an appointment in the last year OR does the patient have an upcoming appointment? Yes  Can we respond through MyChart? Yes  Agent: Please be advised that Rx refills may take up to 3 business days. We ask that you follow-up with your pharmacy.

## 2022-12-13 MED ORDER — AMLODIPINE BESYLATE 5 MG PO TABS: 5.0000 mg | ORAL_TABLET | Freq: Every day | ORAL | 1 refills | Status: DC

## 2022-12-13 NOTE — Telephone Encounter (Signed)
Requested Prescriptions  Pending Prescriptions Disp Refills   amLODipine (NORVASC) 5 MG tablet 90 tablet 1    Sig: Take 1 tablet (5 mg total) by mouth daily. TAKE 1 TABLET(5 MG) BY MOUTH DAILY     Cardiovascular: Calcium Channel Blockers 2 Failed - 12/12/2022  9:55 AM      Failed - Last BP in normal range    BP Readings from Last 1 Encounters:  12/03/22 (!) 133/91         Passed - Last Heart Rate in normal range    Pulse Readings from Last 1 Encounters:  12/03/22 92         Passed - Valid encounter within last 6 months    Recent Outpatient Visits           1 week ago Arthralgia, unspecified joint   [facility] [place]. [facility] Hoy Register, MD   3 months ago Essential hypertension   [facility] [place]. [facility] Storm Frisk, MD   4 months ago Nocturnal leg cramps   [facility] [place]. [place], [place] W, NP   4 months ago Bipolar disorder, current episode depressed, severe, without psychotic features (HCC)   [facility] [place]. [facility] Storm Frisk, MD   7 months ago Displaced fracture of lateral end of left clavicle, initial encounter for closed fracture   [facility] [place]. [facility] Storm Frisk, MD

## 2022-12-14 ENCOUNTER — Other Ambulatory Visit: Payer: Self-pay | Admitting: Critical Care Medicine

## 2022-12-14 DIAGNOSIS — G4762 Sleep related leg cramps: Secondary | ICD-10-CM

## 2022-12-14 MED ORDER — AMLODIPINE BESYLATE 5 MG PO TABS: 5.0000 mg | ORAL_TABLET | Freq: Every day | ORAL | 1 refills | Status: DC

## 2022-12-14 NOTE — Telephone Encounter (Signed)
Patient called stated he need all his meds to go to [facility] as they will deliver to him [facility] - [place], [place] - [address] Phone: [phone]  Fax: [phone]

## 2022-12-14 NOTE — Telephone Encounter (Signed)
The patient states his amLODipine (NORVASC) 5 MG tablet was called in yesterday to [facility] but he is unable to pick it up there. He is requesting for it now to be sent to   [facility] [place], [place] - [address] Phone: [phone]  Fax: [phone]      Along with all his other medications he called in about. Please assist patient further.

## 2022-12-14 NOTE — Telephone Encounter (Signed)
Requested Prescriptions  Pending Prescriptions Disp Refills   amLODipine (NORVASC) 5 MG tablet 90 tablet 1    Sig: Take 1 tablet (5 mg total) by mouth daily. TAKE 1 TABLET(5 MG) BY MOUTH DAILY     Cardiovascular: Calcium Channel Blockers 2 Failed - 12/14/2022  2:43 PM      Failed - Last BP in normal range    BP Readings from Last 1 Encounters:  12/03/22 (!) 133/91         Passed - Last Heart Rate in normal range    Pulse Readings from Last 1 Encounters:  12/03/22 92         Passed - Valid encounter within last 6 months    Recent Outpatient Visits           1 week ago Arthralgia, unspecified joint   [facility] [place]. [facility] Hoy Register, MD   3 months ago Essential hypertension   [facility] [place]. [facility] Storm Frisk, MD   4 months ago Nocturnal leg cramps   [facility] [place]. [facility] [place], [place] W, NP   4 months ago Bipolar disorder, current episode depressed, severe, without psychotic features (HCC)   [facility] [place]. [facility] Storm Frisk, MD   8 months ago Displaced fracture of lateral end of left clavicle, initial encounter for closed fracture   [facility] Of [place]. [facility] Storm Frisk, MD

## 2022-12-14 NOTE — Telephone Encounter (Signed)
Patient called and medication list gone over. He noted all of the medications to go to [facility]. He said to keep [facility] on his list of pharmacies because it's close to his house and sometimes he can get a ride there.

## 2022-12-17 MED ORDER — CROMOLYN SODIUM 4 % OP SOLN: 1.0000 [drp] | Freq: Four times a day (QID) | OPHTHALMIC | 2 refills | Status: DC | PRN

## 2022-12-17 MED ORDER — AZELASTINE HCL 0.1 % NA SOLN: 2.0000 | Freq: Two times a day (BID) | NASAL | 0 refills | Status: DC

## 2022-12-17 MED ORDER — LEVOCETIRIZINE DIHYDROCHLORIDE 5 MG PO TABS: 5.0000 mg | ORAL_TABLET | Freq: Every evening | ORAL | 5 refills | Status: DC

## 2022-12-17 MED ORDER — MELOXICAM 7.5 MG PO TABS: 7.5000 mg | ORAL_TABLET | Freq: Every day | ORAL | 1 refills | Status: DC

## 2022-12-17 MED ORDER — GABAPENTIN 300 MG PO CAPS: 600.0000 mg | ORAL_CAPSULE | Freq: Three times a day (TID) | ORAL | 2 refills | Status: DC

## 2022-12-17 MED ORDER — CYCLOBENZAPRINE HCL 5 MG PO TABS: 5.0000 mg | ORAL_TABLET | Freq: Every evening | ORAL | 2 refills | Status: DC

## 2022-12-17 NOTE — Telephone Encounter (Signed)
Requested medication (s) are due for refill today: no  Requested medication (s) are on the active medication list: yes  Last refill:  levocetrizine: 10/24/22 #30 5 RF   Vitamin D: 10/15/22 12 capsules 4 RF      azelastine: 10/04/22 #30 5 RF   cromolyn: 10/24/22 10ml 5 RF  Future visit scheduled: no  Notes to clinic:    Pt is wanting these meds to [facility] and ordered by Dr Marcelyn Bruins ordered levocetirizine, azelastine, cromoiyn.  Vitamin D is not delegated to NT to refill or refuse  Requested Prescriptions  Pending Prescriptions Disp Refills   levocetirizine (XYZAL) 5 MG tablet 30 tablet     Sig: Take 1 tablet (5 mg total) by mouth every evening.     Ear, Nose, and Throat:  Antihistamines - levocetirizine dihydrochloride Passed - 12/14/2022 10:37 AM      Passed - Cr in normal range and within 360 days    Creatinine, Ser  Date Value Ref Range Status  12/03/2022 1.14 0.76 - 1.27 mg/dL Final         Passed - eGFR is 10 or above and within 360 days    GFR calc Af Amer  Date Value Ref Range Status  08/20/2019 >60 >60 mL/min Final   GFR, Estimated  Date Value Ref Range Status  06/26/2022 >60 >60 mL/min Final    Comment:    (NOTE) Calculated using the CKD-EPI Creatinine Equation (2021)    eGFR  Date Value Ref Range Status  12/03/2022 74 >59 mL/min/1.73 Final         Passed - Valid encounter within last 12 months    Recent Outpatient Visits           2 weeks ago Arthralgia, unspecified joint   [facility] - [facility] Dept Of [place]. [facility] Hoy Register, MD   3 months ago Essential hypertension   [place] [facility] - A Dept Of [place]. [facility] Storm Frisk, MD   4 months ago Nocturnal leg cramps   [place] [facility] - A Dept Of [place]. [facility] [place], [place] W, NP   4 months ago Bipolar disorder, current episode depressed, severe, without psychotic features  (HCC)   [place] [facility] - A Dept Of [place]. [facility] Storm Frisk, MD   8 months ago Displaced fracture of lateral end of left clavicle, initial encounter for closed fracture    [facility] - A Dept Of [place]. [facility] Storm Frisk, MD               Vitamin D, Ergocalciferol, (DRISDOL) 1.25 MG (50000 UNIT) CAPS capsule 12 capsule     Sig: Take 1 capsule (50,000 Units total) by mouth every Sunday.     Endocrinology:  Vitamins - Vitamin D Supplementation 2 Failed - 12/14/2022 10:37 AM      Failed - Manual Review: Route requests for 50,000 IU strength to the provider      Passed - Ca in normal range and within 360 days    Calcium  Date Value Ref Range Status  12/03/2022 9.3 8.6 - 10.2 mg/dL Final         Passed - Vitamin D in normal range and within 360 days    Vit D, 25-Hydroxy  Date Value Ref Range Status  09/04/2022 51.7 30.0 - 100.0  ng/mL Final    Comment:    Vitamin D deficiency has been defined by the Institute of Medicine and an Endocrine Society practice guideline as a level of serum 25-OH vitamin D less than 20 ng/mL (1,2). The Endocrine Society went on to further define vitamin D insufficiency as a level between 21 and 29 ng/mL (2). 1. IOM (Institute of Medicine). 2010. Dietary reference    intakes for calcium and D. [place]: The    [employer]. 2. Holick MF, Binkley Olds, Bischoff-Ferrari HA, et al.    Evaluation, treatment, and prevention of vitamin D    deficiency: an Endocrine Society clinical practice    guideline. JCEM. 2011 Jul; 96(7):1911-30.          Passed - Valid encounter within last 12 months    Recent Outpatient Visits           2 weeks ago Arthralgia, unspecified joint   [place] [facility] - A Dept Of [place]. [facility] Hoy Register, MD   3 months ago Essential hypertension   [place] [facility] [place] - A Dept Of [place]  [facility] Storm Frisk, MD   4 months ago Nocturnal leg cramps   [place] [facility] - A Dept Of [place]. [facility] [place], [place] W, NP   4 months ago Bipolar disorder, current episode depressed, severe, without psychotic features (HCC)   [place] [facility] - A Dept Of [place]. [facility] Storm Frisk, MD   8 months ago Displaced fracture of lateral end of left clavicle, initial encounter for closed fracture   [place] [facility] - A Dept Of [place]. [facility] Storm Frisk, MD               azelastine (ASTELIN) 0.1 % nasal spray 30 mL     Sig: Place 2 sprays into both nostrils 2 (two) times daily. Use in each nostril as directed     Ear, Nose, and Throat: Nasal Preparations - Antiallergy Passed - 12/14/2022 10:37 AM      Passed - Valid encounter within last 12 months    Recent Outpatient Visits           2 weeks ago Arthralgia, unspecified joint   [place] [facility] - A Dept Of [place]. [facility] Hoy Register, MD   3 months ago Essential hypertension   [place] [facility] [place] - A Dept Of [place]. [facility] Storm Frisk, MD   4 months ago Nocturnal leg cramps   [place] [facility] [place] - A Dept Of [place]. [facility] [place], [place] W, NP   4 months ago Bipolar disorder, current episode depressed, severe, without psychotic features (HCC)   [place] [facility] - A Dept Of . [facility] Storm Frisk, MD   8 months ago Displaced fracture of lateral end of left clavicle, initial encounter for closed fracture   [place] [facility] - A Dept Of . [facility] Storm Frisk, MD               cyclobenzaprine (FLEXERIL) 5 MG tablet 30 tablet     Sig: Take 1 tablet (5 mg total) by mouth at bedtime. For muscle cramps     Not Delegated -  Analgesics:  Muscle Relaxants  Failed - 12/14/2022 10:37 AM      Failed - This refill cannot be delegated      Passed - Valid encounter within last 6 months    Recent Outpatient Visits           2 weeks ago Arthralgia, unspecified joint   [place] [facility] - A Dept Of [place]. [facility] Hoy Register, MD   3 months ago Essential hypertension   [place] [facility] - A Dept Of [place]. [facility] Storm Frisk, MD   4 months ago Nocturnal leg cramps   [place] [facility] - A Dept Of [place]. [facility] [place], [place] W, NP   4 months ago Bipolar disorder, current episode depressed, severe, without psychotic features (HCC)   [place] [facility] - A Dept Of [place]. [facility] Storm Frisk, MD   8 months ago Displaced fracture of lateral end of left clavicle, initial encounter for closed fracture   [place] [facility] - A Dept Of [place]. [facility] Storm Frisk, MD               cromolyn (OPTICROM) 4 % ophthalmic solution 10 mL     Sig: Place 1 drop into both eyes 4 (four) times daily as needed (Itchy, watery eyes).     Ophthalmology:  Quincy Carnes - 12/14/2022 10:37 AM      Passed - Valid encounter within last 12 months    Recent Outpatient Visits           2 weeks ago Arthralgia, unspecified joint   [place] [facility] - A Dept Of [place]. [facility] Hoy Register, MD   3 months ago Essential hypertension   [place] [facility] - A Dept Of [place]. [facility] Storm Frisk, MD   4 months ago Nocturnal leg cramps   [place] [facility] - A Dept Of [place]. [facility] [place], [place] W, NP   4 months ago Bipolar disorder, current episode depressed, severe, without psychotic features (HCC)   [place] [facility] - A Dept Of [place]. [facility] Storm Frisk, MD   8 months ago Displaced fracture of lateral end of left clavicle, initial encounter for closed fracture   [place] [facility] - A Dept Of [place]. [facility] Storm Frisk, MD              Signed Prescriptions Disp Refills   gabapentin (NEURONTIN) 300 MG capsule 180 capsule 2    Sig: Take 2 capsules (600 mg total) by mouth 3 (three) times daily.     Neurology: Anticonvulsants - gabapentin Passed - 12/14/2022 10:37 AM      Passed - Cr in normal range and within 360 days    Creatinine, Ser  Date Value Ref Range Status  12/03/2022 1.14 0.76 - 1.27 mg/dL Final         Passed - Completed PHQ-2 or PHQ-9 in the last 360 days      Passed - Valid encounter within last 12 months    Recent Outpatient Visits           2 weeks ago Arthralgia, unspecified joint   [place] [facility] - A Dept Of [place]. [facility] Hoy Register, MD  3 months ago Essential hypertension   [place] [facility] - A Dept Of [place]. [facility] Storm Frisk, MD   4 months ago Nocturnal leg cramps   [place] [facility] [place] - A Dept Of [place]. [facility] [place], [place] W, NP   4 months ago Bipolar disorder, current episode depressed, severe, without psychotic features (HCC)   [place] [facility] - A Dept Of [place]. [facility] Storm Frisk, MD   8 months ago Displaced fracture of lateral end of left clavicle, initial encounter for closed fracture   [place] [facility] - A Dept Of [place]. [facility] Storm Frisk, MD               meloxicam (MOBIC) 7.5 MG tablet 30 tablet 1    Sig: Take 1 tablet (7.5 mg total) by mouth daily.     Analgesics:  COX2 Inhibitors Failed - 12/14/2022 10:37 AM      Failed - Manual Review: Labs are only required if the patient has taken medication for more than 8 weeks.      Passed - HGB  in normal range and within 360 days    Hemoglobin  Date Value Ref Range Status  06/26/2022 14.5 13.0 - 17.0 g/dL Final  01/30/3233 57.3 13.0 - 17.7 g/dL Final         Passed - Cr in normal range and within 360 days    Creatinine, Ser  Date Value Ref Range Status  12/03/2022 1.14 0.76 - 1.27 mg/dL Final         Passed - HCT in normal range and within 360 days    HCT  Date Value Ref Range Status  06/26/2022 43.5 39.0 - 52.0 % Final   Hematocrit  Date Value Ref Range Status  03/01/2022 40.7 37.5 - 51.0 % Final         Passed - AST in normal range and within 360 days    AST  Date Value Ref Range Status  09/04/2022 22 0 - 40 IU/L Final         Passed - ALT in normal range and within 360 days    ALT  Date Value Ref Range Status  09/04/2022 16 0 - 44 IU/L Final         Passed - eGFR is 30 or above and within 360 days    GFR calc Af Amer  Date Value Ref Range Status  08/20/2019 >60 >60 mL/min Final   GFR, Estimated  Date Value Ref Range Status  06/26/2022 >60 >60 mL/min Final    Comment:    (NOTE) Calculated using the CKD-EPI Creatinine Equation (2021)    eGFR  Date Value Ref Range Status  12/03/2022 74 >59 mL/min/1.73 Final         Passed - Patient is not pregnant      Passed - Valid encounter within last 12 months    Recent Outpatient Visits           2 weeks ago Arthralgia, unspecified joint   [place] [facility] - A Dept Of [place]. [facility] Hoy Register, MD   3 months ago Essential hypertension   [place] [facility] - A Dept Of [place]. [facility] Storm Frisk, MD   4 months ago Nocturnal leg cramps   [place] [facility] Merry Proud - A Dept  Of [place]. [facility] [place], [place] W, NP   4 months ago Bipolar disorder, current episode depressed, severe, without psychotic features (HCC)   [place] [facility] - A Dept Of [place]. [facility] Storm Frisk, MD    8 months ago Displaced fracture of lateral end of left clavicle, initial encounter for closed fracture   [place] Comm [facility] - A Dept Of . [facility] Storm Frisk, MD

## 2022-12-17 NOTE — Telephone Encounter (Signed)
Change of pharmacy Requested Prescriptions  Pending Prescriptions Disp Refills   gabapentin (NEURONTIN) 300 MG capsule 180 capsule 2    Sig: Take 2 capsules (600 mg total) by mouth 3 (three) times daily.     Neurology: Anticonvulsants - gabapentin Passed - 12/14/2022 10:37 AM      Passed - Cr in normal range and within 360 days    Creatinine, Ser  Date Value Ref Range Status  12/03/2022 1.14 0.76 - 1.27 mg/dL Final         Passed - Completed PHQ-2 or PHQ-9 in the last 360 days      Passed - Valid encounter within last 12 months    Recent Outpatient Visits           2 weeks ago Arthralgia, unspecified joint   [place] [facility] - A Dept Of [place]. [facility] Hoy Register, MD   3 months ago Essential hypertension   [place] [facility] [place] - A Dept Of [place]. [facility] Storm Frisk, MD   4 months ago Nocturnal leg cramps   [place] [facility] [place] - A Dept Of [place]. [facility] [place], [place] W, NP   4 months ago Bipolar disorder, current episode depressed, severe, without psychotic features (HCC)   [place] [facility] - A Dept Of [place]. [facility] Storm Frisk, MD   8 months ago Displaced fracture of lateral end of left clavicle, initial encounter for closed fracture   [place] [facility] - A Dept Of [place]. [facility] Storm Frisk, MD               meloxicam (MOBIC) 7.5 MG tablet 30 tablet 1    Sig: Take 1 tablet (7.5 mg total) by mouth daily.     Analgesics:  COX2 Inhibitors Failed - 12/14/2022 10:37 AM      Failed - Manual Review: Labs are only required if the patient has taken medication for more than 8 weeks.      Passed - HGB in normal range and within 360 days    Hemoglobin  Date Value Ref Range Status  06/26/2022 14.5 13.0 - 17.0 g/dL Final  91/47/8295 62.1 13.0 - 17.7 g/dL Final         Passed - Cr in normal range and within  360 days    Creatinine, Ser  Date Value Ref Range Status  12/03/2022 1.14 0.76 - 1.27 mg/dL Final         Passed - HCT in normal range and within 360 days    HCT  Date Value Ref Range Status  06/26/2022 43.5 39.0 - 52.0 % Final   Hematocrit  Date Value Ref Range Status  03/01/2022 40.7 37.5 - 51.0 % Final         Passed - AST in normal range and within 360 days    AST  Date Value Ref Range Status  09/04/2022 22 0 - 40 IU/L Final         Passed - ALT in normal range and within 360 days    ALT  Date Value Ref Range Status  09/04/2022 16 0 - 44 IU/L Final         Passed - eGFR is 30 or above and within 360 days    GFR calc Af Amer  Date Value Ref Range Status  08/20/2019 >60 >60 mL/min Final  GFR, Estimated  Date Value Ref Range Status  06/26/2022 >60 >60 mL/min Final    Comment:    (NOTE) Calculated using the CKD-EPI Creatinine Equation (2021)    eGFR  Date Value Ref Range Status  12/03/2022 74 >59 mL/min/1.73 Final         Passed - Patient is not pregnant      Passed - Valid encounter within last 12 months    Recent Outpatient Visits           2 weeks ago Arthralgia, unspecified joint   [place] [facility] - A Dept Of [place]. [facility] Hoy Register, MD   3 months ago Essential hypertension   [place] [facility] [place] - A Dept Of [place]. [facility] Storm Frisk, MD   4 months ago Nocturnal leg cramps   [place] [facility] Delle[place] - A Dept Of [place]. [facility] [place], [place] W, NP   4 months ago Bipolar disorder, current episode depressed, severe, without psychotic features (HCC)   [place] [facility] - A Dept Of [place]. [facility] Storm Frisk, MD   8 months ago Displaced fracture of lateral end of left clavicle, initial encounter for closed fracture   [place] [facility] - A Dept Of [place]. [facility] Storm Frisk, MD                levocetirizine (XYZAL) 5 MG tablet 30 tablet     Sig: Take 1 tablet (5 mg total) by mouth every evening.     Ear, Nose, and Throat:  Antihistamines - levocetirizine dihydrochloride Passed - 12/14/2022 10:37 AM      Passed - Cr in normal range and within 360 days    Creatinine, Ser  Date Value Ref Range Status  12/03/2022 1.14 0.76 - 1.27 mg/dL Final         Passed - eGFR is 10 or above and within 360 days    GFR calc Af Amer  Date Value Ref Range Status  08/20/2019 >60 >60 mL/min Final   GFR, Estimated  Date Value Ref Range Status  06/26/2022 >60 >60 mL/min Final    Comment:    (NOTE) Calculated using the CKD-EPI Creatinine Equation (2021)    eGFR  Date Value Ref Range Status  12/03/2022 74 >59 mL/min/1.73 Final         Passed - Valid encounter within last 12 months    Recent Outpatient Visits           2 weeks ago Arthralgia, unspecified joint   [place] [facility] - A Dept Of [place]. [facility] Hoy Register, MD   3 months ago Essential hypertension   [place] [facility] [place] - A Dept Of [place]. [facility] Storm Frisk, MD   4 months ago Nocturnal leg cramps   [place] [facility] [place] - A Dept Of [place]. [facility] [place], [place] W, NP   4 months ago Bipolar disorder, current episode depressed, severe, without psychotic features (HCC)   [place] [facility] - A Dept Of [place]. [facility] Storm Frisk, MD   8 months ago Displaced fracture of lateral end of left clavicle, initial encounter for closed fracture   [place] [facility] - A Dept Of [place]. [facility] Storm Frisk, MD  Vitamin D, Ergocalciferol, (DRISDOL) 1.25 MG (50000 UNIT) CAPS capsule 12 capsule     Sig: Take 1 capsule (50,000 Units total) by mouth every Sunday.     Endocrinology:  Vitamins - Vitamin D Supplementation 2 Failed - 12/14/2022 10:37  AM      Failed - Manual Review: Route requests for 50,000 IU strength to the provider      Passed - Ca in normal range and within 360 days    Calcium  Date Value Ref Range Status  12/03/2022 9.3 8.6 - 10.2 mg/dL Final         Passed - Vitamin D in normal range and within 360 days    Vit D, 25-Hydroxy  Date Value Ref Range Status  09/04/2022 51.7 30.0 - 100.0 ng/mL Final    Comment:    Vitamin D deficiency has been defined by the Institute of Medicine and an Endocrine Society practice guideline as a level of serum 25-OH vitamin D less than 20 ng/mL (1,2). The Endocrine Society went on to further define vitamin D insufficiency as a level between 21 and 29 ng/mL (2). 1. IOM (Institute of Medicine). 2010. Dietary reference    intakes for calcium and D. [place]: The    [employer]. 2. Holick MF, Binkley , Bischoff-Ferrari HA, et al.    Evaluation, treatment, and prevention of vitamin D    deficiency: an Endocrine Society clinical practice    guideline. JCEM. 2011 Jul; 96(7):1911-30.          Passed - Valid encounter within last 12 months    Recent Outpatient Visits           2 weeks ago Arthralgia, unspecified joint   [place] [facility] - A Dept Of [place]. [facility] Hoy Register, MD   3 months ago Essential hypertension   [place] [facility] [place] - A Dept Of [place]. [facility] Storm Frisk, MD   4 months ago Nocturnal leg cramps   [place] [facility] - A Dept Of [place]. [facility] [place], [place] W, NP   4 months ago Bipolar disorder, current episode depressed, severe, without psychotic features (HCC)   [place] [facility] - A Dept Of [place]. [facility] Storm Frisk, MD   8 months ago Displaced fracture of lateral end of left clavicle, initial encounter for closed fracture   [place] [facility] - A Dept Of [place]. [facility]  Storm Frisk, MD               azelastine (ASTELIN) 0.1 % nasal spray 30 mL     Sig: Place 2 sprays into both nostrils 2 (two) times daily. Use in each nostril as directed     Ear, Nose, and Throat: Nasal Preparations - Antiallergy Passed - 12/14/2022 10:37 AM      Passed - Valid encounter within last 12 months    Recent Outpatient Visits           2 weeks ago Arthralgia, unspecified joint   [place] [facility] - A Dept Of [place]. [facility] Hoy Register, MD   3 months ago Essential hypertension   [place] [facility] [place] - A Dept Of . [facility] Storm Frisk, MD   4 months ago Nocturnal leg cramps   [place] [facility] Merry Proud - A Dept Of  [place]. [facility] [place], [place] W, NP   4 months ago Bipolar disorder, current episode depressed, severe, without psychotic features (HCC)   [place] [facility] - A Dept Of [place]. [facility] Storm Frisk, MD   8 months ago Displaced fracture of lateral end of left clavicle, initial encounter for closed fracture   [place] [facility] - A Dept Of [place]. [facility] Storm Frisk, MD               cyclobenzaprine (FLEXERIL) 5 MG tablet 30 tablet     Sig: Take 1 tablet (5 mg total) by mouth at bedtime. For muscle cramps     Not Delegated - Analgesics:  Muscle Relaxants Failed - 12/14/2022 10:37 AM      Failed - This refill cannot be delegated      Passed - Valid encounter within last 6 months    Recent Outpatient Visits           2 weeks ago Arthralgia, unspecified joint   [place] [facility] - A Dept Of [place]. [facility] Hoy Register, MD   3 months ago Essential hypertension   [place] [facility] [place] - A Dept Of C[place]. [facility] Storm Frisk, MD   4 months ago Nocturnal leg cramps   [place] [facility] [place] - A Dept Of [place].  [facility] [place], [place] W, NP   4 months ago Bipolar disorder, current episode depressed, severe, without psychotic features (HCC)   [place] [facility] - A Dept Of [place]. [facility] Storm Frisk, MD   8 months ago Displaced fracture of lateral end of left clavicle, initial encounter for closed fracture   [place] [facility] - A Dept Of [place]. [facility] Storm Frisk, MD               cromolyn (OPTICROM) 4 % ophthalmic solution 10 mL     Sig: Place 1 drop into both eyes 4 (four) times daily as needed (Itchy, watery eyes).     Ophthalmology:  Quincy Carnes - 12/14/2022 10:37 AM      Passed - Valid encounter within last 12 months    Recent Outpatient Visits           2 weeks ago Arthralgia, unspecified joint   [place] [facility] - A Dept Of [place]. [facility] Hoy Register, MD   3 months ago Essential hypertension   [place] [facility] [place] - A Dept Of [place]. [facility] Storm Frisk, MD   4 months ago Nocturnal leg cramps   [place] [facility] [place] - A Dept Of [place]. [facility] [place], [place] W, NP   4 months ago Bipolar disorder, current episode depressed, severe, without psychotic features (HCC)   [place] [facility] Well[facility] - A Dept Of [place]. [facility] Storm Frisk, MD   8 months ago Displaced fracture of lateral end of left clavicle, initial encounter for closed fracture    Comm [facility] - A Dept Of . [facility] Storm Frisk, MD

## 2022-12-23 ENCOUNTER — Other Ambulatory Visit: Payer: Self-pay

## 2022-12-23 ENCOUNTER — Emergency Department (HOSPITAL_COMMUNITY): Payer: MEDICAID

## 2022-12-23 ENCOUNTER — Emergency Department (HOSPITAL_COMMUNITY)
Admission: EM | Admit: 2022-12-23 | Discharge: 2022-12-23 | Disposition: A | Discharge status: Home / Self Care | Payer: MEDICAID | Attending: Emergency Medicine | Admitting: Emergency Medicine

## 2022-12-23 ENCOUNTER — Encounter (HOSPITAL_COMMUNITY): Payer: Self-pay | Admitting: Emergency Medicine

## 2022-12-23 DIAGNOSIS — N200 Calculus of kidney: Secondary | ICD-10-CM | POA: Insufficient documentation

## 2022-12-23 DIAGNOSIS — Z1152 Encounter for screening for COVID-19: Secondary | ICD-10-CM | POA: Insufficient documentation

## 2022-12-23 DIAGNOSIS — Z79899 Other long term (current) drug therapy: Secondary | ICD-10-CM | POA: Insufficient documentation

## 2022-12-23 DIAGNOSIS — I10 Essential (primary) hypertension: Secondary | ICD-10-CM | POA: Insufficient documentation

## 2022-12-23 DIAGNOSIS — R519 Headache, unspecified: Secondary | ICD-10-CM | POA: Diagnosis present

## 2022-12-23 LAB — BASIC METABOLIC PANEL
Anion gap: 6 (ref 5–15)
BUN: 17 mg/dL (ref 6–20)
CO2: 25 mmol/L (ref 22–32)
Calcium: 9 mg/dL (ref 8.9–10.3)
Chloride: 106 mmol/L (ref 98–111)
Creatinine, Ser: 1.14 mg/dL (ref 0.61–1.24)
GFR, Estimated: >60 mL/min (ref >60)
Glucose, Bld: 102 mg/dL — ABNORMAL HIGH (ref 70–99)
Potassium: 3.6 mmol/L (ref 3.5–5.1)
Sodium: 137 mmol/L (ref 135–145)

## 2022-12-23 LAB — CBC WITH DIFFERENTIAL/PLATELET
Abs Immature Granulocytes: 0.01 10*3/uL (ref 0.00–0.07)
Basophils Absolute: 0 10*3/uL (ref 0.0–0.1)
Basophils Relative: 0 %
Eosinophils Absolute: 0.1 10*3/uL (ref 0.0–0.5)
Eosinophils Relative: 3 %
HCT: 39.6 % (ref 39.0–52.0)
Hemoglobin: 13.1 g/dL (ref 13.0–17.0)
Immature Granulocytes: 0 %
Lymphocytes Relative: 48 %
Lymphs Abs: 2.2 10*3/uL (ref 0.7–4.0)
MCH: 29.1 pg (ref 26.0–34.0)
MCHC: 33.1 g/dL (ref 30.0–36.0)
MCV: 88 fL (ref 80.0–100.0)
Monocytes Absolute: 0.4 10*3/uL (ref 0.1–1.0)
Monocytes Relative: 9 %
Neutro Abs: 1.8 10*3/uL (ref 1.7–7.7)
Neutrophils Relative %: 40 %
Platelets: 182 10*3/uL (ref 150–400)
RBC: 4.5 MIL/uL (ref 4.22–5.81)
RDW: 14.2 % (ref 11.5–15.5)
WBC: 4.5 10*3/uL (ref 4.0–10.5)
nRBC: 0 % (ref 0.0–0.2)

## 2022-12-23 LAB — URINALYSIS, ROUTINE W REFLEX MICROSCOPIC
Bacteria, UA: NONE SEEN
Bilirubin Urine: NEGATIVE
Glucose, UA: NEGATIVE mg/dL
Ketones, ur: NEGATIVE mg/dL
Leukocytes,Ua: NEGATIVE
Nitrite: NEGATIVE
Protein, ur: NEGATIVE mg/dL
Specific Gravity, Urine: 1.005 (ref 1.005–1.030)
pH: 7 (ref 5.0–8.0)

## 2022-12-23 LAB — RESP PANEL BY RT-PCR (RSV, FLU A&B, COVID)  RVPGX2
Influenza A by PCR: NEGATIVE
Influenza B by PCR: NEGATIVE
Resp Syncytial Virus by PCR: NEGATIVE
SARS Coronavirus 2 by RT PCR: NEGATIVE

## 2022-12-23 NOTE — ED Notes (Signed)
Pt requesting to void at this time.

## 2022-12-23 NOTE — ED Notes (Signed)
Pt requesting xray of his back for lower back pain after falling earlier today. PA made aware.
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
## 2022-12-23 NOTE — Discharge Instructions (Signed)
Please follow-up with your primary care provider regards resumes ER visit.  Today your labs imaging are reassuring.  CT does show you have some nonobstructing stones in your kidneys that should not be causing any symptoms however you begin to have flank pain I have attached a urologist for you to follow-up with.  If symptoms change or worsen please return to ER.

## 2022-12-23 NOTE — ED Notes (Signed)
Pt in NAD at d/c from ED. A&O. Ambulatory. Respirations even & unlabored. Skin warm & dry. Pt verbalized understanding of d/c teaching including follow up care and reasons to return to the ED. No needs or questions expressed at d/c.

## 2022-12-31 ENCOUNTER — Other Ambulatory Visit: Payer: Self-pay | Admitting: Critical Care Medicine

## 2023-01-01 ENCOUNTER — Telehealth: Payer: Self-pay | Admitting: Gastroenterology

## 2023-01-01 ENCOUNTER — Ambulatory Visit (AMBULATORY_SURGERY_CENTER): Payer: MEDICAID | Admitting: *Deleted

## 2023-01-01 VITALS — Ht 71.0 in | Wt 205.0 lb

## 2023-01-01 DIAGNOSIS — Z1211 Encounter for screening for malignant neoplasm of colon: Secondary | ICD-10-CM

## 2023-01-01 MED ORDER — NA SULFATE-K SULFATE-MG SULF 17.5-3.13-1.6 GM/177ML PO SOLN: 1.0000 | Freq: Once | ORAL | 0 refills | Status: AC

## 2023-01-01 NOTE — Telephone Encounter (Signed)
Spoke with the patient. He has not been evaluated for his "trouble swallowing." Endorses choking "sometimes." He says his other doctor taught him to "tuck my chin." Patient would like to be seen for this issues. Patient is able to take nourishment. Appointment scheduled.

## 2023-01-01 NOTE — Progress Notes (Addendum)
Pt's name and DOB verified at the beginning of the pre-visit wit 2 identifiers  Pt states he is unsteady on his feet. Uses cane  Pt has no issues moving head neck  but has had some issues with swallowing  No egg or soy allergy known to patient   No issues known to pt with past sedation with any surgeries or procedures  Pt denies having issues being intubated  No FH of Malignant Hyperthermia  Pt is not on diet pills or shots  Pt is not on home 02   Pt is not on blood thinners   Pt denies issues with constipation   Pt is not on dialysis  Pt denise any abnormal heart rhythms   Pt denies any upcoming cardiac testing  Pt encouraged to use to use Singlecare or Goodrx to reduce cost   Patient's chart reviewed by Cathlyn Parsons CNRA prior to pre-visit and patient appropriate for the [facility].  Pre-visit completed and red dot placed by patient's name on their procedure day (on provider's schedule).  .  Visit by phone   Pt states weight is 205 lb   Instructed pt why it is important to and  to call if they have any changes in health or new medications. Directed them to the # given and on instructions.     Instructions reviewed. Pt given both [facility] main # and MD on call # prior to instructions.  Pt states understanding. Instructed to review again prior to procedure. Pt states they will.   Instructions sent by mail with coupon and by My Chart    Informed pt that he could go see someone in the office to about his swallowing issues so he can maybe set up an Endo colon. Gave pt # to call and spelled each thing he is to ask for. Pt has very short term memory loss> Current procedure not canceled due to pt not sure if he will call and get a OV for Rndo /colon

## 2023-01-01 NOTE — Telephone Encounter (Signed)
Patient called and stated that he had his pre-op appointment today and the burse that he was talking to advise him that he should get a follow up appointment before his procedure due to him having trouble swallowing. Please advise.

## 2023-01-11 ENCOUNTER — Ambulatory Visit: Payer: Self-pay | Admitting: *Deleted

## 2023-01-11 ENCOUNTER — Ambulatory Visit: Payer: Self-pay

## 2023-01-11 NOTE — Telephone Encounter (Signed)
Agree with disposition. No appointments available today if S/S  continue UC is advised. Patient has OV scheduled on 02/04/2022

## 2023-01-11 NOTE — Telephone Encounter (Signed)
     Chief Complaint: Right leg pain Symptoms: Above Frequency: Months Pertinent Negatives: Patient denies  Disposition: [] ED /[x] Urgent Care (no appt availability in office) / [] Appointment(In office/virtual)/ []  [facility]/ [] Home Care/ [] Refused Recommended Disposition /[] [facility]/ []  Follow-up with PCP Additional Notes: Agrees with UC.  Reason for Disposition  [1] MODERATE pain (e.g., interferes with normal activities, limping) AND [2] present > 3 days  Answer Assessment - Initial Assessment Questions 1. ONSET: "When did the pain start?"      Months - comes and goes 2. LOCATION: "Where is the pain located?"      Right  3. PAIN: "How bad is the pain?"    (Scale 1-10; or mild, moderate, severe)   -  MILD (1-3): doesn't interfere with normal activities    -  MODERATE (4-7): interferes with normal activities (e.g., work or school) or awakens from sleep, limping    -  SEVERE (8-10): excruciating pain, unable to do any normal activities, unable to walk     10 4. WORK OR EXERCISE: "Has there been any recent work or exercise that involved this part of the body?"      No 5. CAUSE: "What do you think is causing the leg pain?"     Unsure 6. OTHER SYMPTOMS: "Do you have any other symptoms?" (e.g., chest pain, back pain, breathing difficulty, swelling, rash, fever, numbness, weakness)     Cramps 7. PREGNANCY: "Is there any chance you are pregnant?" "When was your last menstrual period?"     N/a  Protocols used: Leg Pain-A-AH

## 2023-01-11 NOTE — Telephone Encounter (Addendum)
  Chief Complaint: dizziness, "feels blurry" Symptoms: dizziness, diarrhea- 3 days-lose stools-patient states he is hydrating Frequency: 1 week Pertinent Negatives: Patient denies feeling unsteady Disposition: [] ED /[x] Urgent Care (no appt availability in office) / [] Appointment(In office/virtual)/ []  [facility]/ [] Home Care/ [] Refused Recommended Disposition /[]  Mobile Bus/ []  Follow-up with PCP Additional Notes: Patient states the last time he felt this way- he had to get adjustment on BP medications- patient is unable to give BP reading- he does not know how to read his cuff- but he did give a number from memory-although he states he does not know if it is correct. Patient needs evaluation- no  open appointment within disposition. Patient advised don't stop medication- will send message to office for appointment- go to UC today for evaluation. He states he will go if he can make it.

## 2023-01-11 NOTE — Telephone Encounter (Signed)
Reason for Disposition  [1] MILD dizziness (e.g., walking normally) AND [2] has NOT been evaluated by doctor (or NP/PA) for this  (Exception: Dizziness caused by heat exposure, sudden standing, or poor fluid intake.)  Answer Assessment - Initial Assessment Questions 1. DESCRIPTION: "Describe your dizziness."     Dizziness, blurry vision, fatigue 2. LIGHTHEADED: "Do you feel lightheaded?" (e.g., somewhat faint, woozy, weak upon standing)     Blurry, diarrhea -3 days 3. VERTIGO: "Do you feel like either you or the room is spinning or tilting?" (i.e. vertigo)     no 4. SEVERITY: "How bad is it?"  "Do you feel like you are going to faint?" "Can you stand and walk?"   - MILD: Feels slightly dizzy, but walking normally.   - MODERATE: Feels unsteady when walking, but not falling; interferes with normal activities (e.g., school, work).   - SEVERE: Unable to walk without falling, or requires assistance to walk without falling; feels like passing out now.      mild 5. ONSET:  "When did the dizziness begin?"     1 week  7. HEART RATE: "Can you tell me your heart rate?" "How many beats in 15 seconds?"  (Note: not all patients can do this)       Wrist cuff- 121/10 not sure 8. CAUSE: "What do you think is causing the dizziness?"     Not sure- possible BP When was the last time?" "What happened that time?"      10. OTHER SYMPTOMS: "Do you have any other symptoms?" (e.g., fever, chest pain, vomiting, diarrhea, bleeding)       diarrhea  Protocols used: Dizziness - Lightheadedness-A-AH

## 2023-01-12 ENCOUNTER — Emergency Department (HOSPITAL_COMMUNITY)
Admission: EM | Admit: 2023-01-12 | Discharge: 2023-01-12 | Disposition: A | Discharge status: Home / Self Care | Payer: MEDICAID | Attending: Emergency Medicine | Admitting: Emergency Medicine

## 2023-01-12 DIAGNOSIS — I10 Essential (primary) hypertension: Secondary | ICD-10-CM | POA: Insufficient documentation

## 2023-01-12 DIAGNOSIS — Z79899 Other long term (current) drug therapy: Secondary | ICD-10-CM | POA: Diagnosis not present

## 2023-01-12 NOTE — Discharge Instructions (Addendum)
Today you were seen for elevated blood pressure readings.  Please continue your current blood pressure regimen of 5 mg amlodipine once daily.  Thank you for letting us treat you today. After performing a physical exam, I feel you are safe to go home. Please follow up with your PCP in the next several days and provide them with your records from this visit. Return to the Emergency Room if pain becomes severe or symptoms worsen.

## 2023-01-12 NOTE — ED Provider Notes (Signed)
[place] EMERGENCY DEPARTMENT AT [facility] Provider Note   CSN: [id number] Arrival date & time: 01/12/23  1219     History  Chief Complaint  Patient presents with   Hypertension    Jeremiah Hunter is a 62 y.o. male presents today for high blood pressure.  Patient states he took his amlodipine around 8 or 9 this morning.  Patient states that around 11:00 he took his blood pressure and it was 239/156.  EMS took his blood pressure and got 180/100.  Patient does endorse 1 episode of diarrhea and mild headache since coming to the ER.  Patient denies any vision changes, chest pain, dizziness, shortness of breath, nausea or vomiting.  Patient does express that he tried to talk to his new PCP who he felt like was not listening to him and which led him to be very frustrated.   Hypertension Associated symptoms include headaches.       Home Medications Prior to Admission medications   Medication Sig Start Date End Date Taking? Authorizing Provider  amLODipine (NORVASC) 5 MG tablet Take 1 tablet (5 mg total) by mouth daily. TAKE 1 TABLET(5 MG) BY MOUTH DAILY 12/14/22 01/13/23  Storm Frisk, MD  azelastine (ASTELIN) 0.1 % nasal spray PLACE TWO SPRAYS INTO BOTH NOSTRILS TWO (TWO) TIMES DAILY. USE IN EACH NOSTRIL AS DIRECTED 12/31/22   Hoy Register, MD  cromolyn (OPTICROM) 4 % ophthalmic solution Place 1 drop into both eyes daily. 10/04/22   Storm Frisk, MD  cromolyn (OPTICROM) 4 % ophthalmic solution Place 1 drop into both eyes 4 (four) times daily as needed (Itchy, watery eyes). 12/17/22   Storm Frisk, MD  cyclobenzaprine (FLEXERIL) 5 MG tablet Take 1 tablet (5 mg total) by mouth at bedtime. For muscle cramps 12/17/22   Storm Frisk, MD  divalproex (DEPAKOTE ER) 500 MG 24 hr tablet Take 2 tablets (1,000 mg total) by mouth daily. 09/04/22   Storm Frisk, MD  DYMISTA [phone] MCG/ACT SUSP Place 1 spray into both nostrils 2 (two) times daily as needed (Runny or  stuffy nose). 10/24/22   Marcelyn Bruins, MD  fluticasone (FLONASE) 50 MCG/ACT nasal spray Place 2 sprays into both nostrils daily. 10/04/22   Marcelyn Bruins, MD  gabapentin (NEURONTIN) 300 MG capsule Take 2 capsules (600 mg total) by mouth 3 (three) times daily. 12/17/22   Storm Frisk, MD  levocetirizine (XYZAL) 5 MG tablet Take 1 tablet (5 mg total) by mouth every evening. 12/17/22   Storm Frisk, MD  meloxicam (MOBIC) 7.5 MG tablet Take 1 tablet (7.5 mg total) by mouth daily. 12/17/22   Storm Frisk, MD  triamcinolone cream (KENALOG) 0.1 % Apply 1 Application topically 2 (two) times daily. Patient not taking: Reported on 01/01/2023 12/03/22   Hoy Register, MD  Vitamin D, Ergocalciferol, (DRISDOL) 1.25 MG (50000 UNIT) CAPS capsule Take 1 capsule (50,000 Units total) by mouth every Sunday. 10/21/22   Storm Frisk, MD      Allergies    Patient has no known allergies.    Review of Systems   Review of Systems  Neurological:  Positive for headaches.    Physical Exam Updated Vital Signs BP (!) 131/100 (BP Location: Left Arm)   Pulse 62   Temp 98.3 F (36.8 C) (Oral)   Resp 16   Ht 5\' 11"  (1.803 m)   Wt 93 kg   SpO2 100%   BMI 28.59 kg/m  Physical Exam Vitals  and nursing note reviewed.  Constitutional:      General: He is not in acute distress.    Appearance: Normal appearance. He is well-developed. He is not ill-appearing or diaphoretic.     Comments: Upon examination patient's blood pressure was 122/88 and pulses were 72.  HENT:     Head: Normocephalic and atraumatic.     Right Ear: External ear normal.     Left Ear: External ear normal.     Nose: Nose normal.  Eyes:     Extraocular Movements: Extraocular movements intact.     Conjunctiva/sclera: Conjunctivae normal.     Pupils: Pupils are equal, round, and reactive to light.  Cardiovascular:     Rate and Rhythm: Normal rate and regular rhythm.     Pulses: Normal pulses.     Heart  sounds: Normal heart sounds. No murmur heard. Pulmonary:     Effort: Pulmonary effort is normal. No respiratory distress.     Breath sounds: Normal breath sounds.  Abdominal:     Palpations: Abdomen is soft.     Tenderness: There is no abdominal tenderness.  Musculoskeletal:        General: No swelling.     Cervical back: Normal range of motion and neck supple.  Skin:    General: Skin is warm and dry.     Capillary Refill: Capillary refill takes less than 2 seconds.  Neurological:     General: No focal deficit present.     Mental Status: He is alert.     Motor: No weakness.  Psychiatric:        Mood and Affect: Mood normal.     ED Results / Procedures / Treatments   Labs (all labs ordered are listed, but only abnormal results are displayed) Labs Reviewed - No data to display  EKG None  Radiology No results found.  Procedures Procedures    Medications Ordered in ED Medications - No data to display  ED Course/ Medical Decision Making/ A&P                                 Medical Decision Making  This patient presents to the ED with chief complaint(s) of hypertension with pertinent past medical history of hypertension which further complicates the presenting complaint. The complaint involves an extensive differential diagnosis and also carries with it a high risk of complications and morbidity.    The differential diagnosis includes elevated blood pressure, hypertensive urgency, hypertensive emergency  Additional history obtained: Records reviewed Primary Care Documents  ED Course and Reassessment:  Consultation: - Consulted or discussed management/test interpretation w/ external professional: None  Consideration for admission or further workup: Considered for mission further workup however patient's vital signs and physical exam are reassuring.  Discussed with patient need for follow-up with primary care to track his blood pressure readings.  Discussed with  patient continuing current blood pressure regiment given his blood pressure on examination of 122/88 and how changing without proper follow-up could lead to hypotension.  Patient is agreeable to continue current regimen and is scheduled to have an appointment with a new primary care physician in February.  Will refill patient's current blood pressure regimen so that he does not run out before this appointment.        Final Clinical Impression(s) / ED Diagnoses Final diagnoses:  Hypertension, unspecified type    Rx / DC Orders ED Discharge Orders  None         Dolphus Jenny, PA-C 01/12/23 1553    Rondel Baton, MD 01/13/23 1003

## 2023-01-12 NOTE — ED Triage Notes (Signed)
Patient bib EMS C/o high blood pres 239/156 taken by patient   Ems got 180/100 Hr 83 Took amlodipine at 8 am Diarrhea x 1 day  Patient denies pain

## 2023-01-13 ENCOUNTER — Encounter: Payer: Self-pay | Admitting: Family Medicine

## 2023-01-14 ENCOUNTER — Telehealth: Payer: Self-pay

## 2023-01-14 ENCOUNTER — Ambulatory Visit: Payer: Self-pay

## 2023-01-14 ENCOUNTER — Other Ambulatory Visit: Payer: Self-pay | Admitting: Family Medicine

## 2023-01-14 ENCOUNTER — Other Ambulatory Visit: Payer: Self-pay | Admitting: Internal Medicine

## 2023-01-14 MED ORDER — AMLODIPINE BESYLATE 5 MG PO TABS: 5.0000 mg | ORAL_TABLET | Freq: Every day | ORAL | 0 refills | Status: AC

## 2023-01-14 NOTE — Telephone Encounter (Signed)
Medication Refill -  Most Recent Primary Care Visit:  Provider: Hoy Register  Department: [facility]  Visit Type: OFFICE VISIT  Date: 12/03/2022  Medication: Vitamin D, Ergocalciferol, (DRISDOL) 1.25 MG (50000 UNIT) CAPS capsule   Has the patient contacted their pharmacy? No Pt requests that the Rx be sent to a different pharmacy  Is this the correct pharmacy for this prescription? Yes If no, delete pharmacy and type the correct one.  This is the patient's preferred pharmacy: [facility] - [place], [place] - [address] [facility] [address] [place] [address] Phone: [phone] Fax: [phone]  Has the prescription been filled recently? No  Is the patient out of the medication? No  Has the patient been seen for an appointment in the last year OR does the patient have an upcoming appointment? Yes  Can we respond through MyChart? No  Agent: Please be advised that Rx refills may take up to 3 business days. We ask that you follow-up with your pharmacy.

## 2023-01-14 NOTE — Telephone Encounter (Signed)
Copied from CRM [id number]. Topic: General - Other >> Jan 14, 2023  9:24 AM Franchot Heidelberg wrote: Reason for CRM: Pt says his amlodipine accidentally got "wasted in a mud puddle in his backyard." He wants to know if his PCP will be willing to write him a new prescription since his next due date is not until 02/18/2023.

## 2023-01-14 NOTE — Addendum Note (Signed)
Addended by: Jonah Blue B on: 01/14/2023 02:57 PM   Modules accepted: Orders

## 2023-01-14 NOTE — Telephone Encounter (Signed)
Summary: Medication question for nurse   Pt does not remember if he took his medication for his BP today  Best contact: [phone]     Chief Complaint: Pt. Cannot remember if he took his BP medication. BP 200/163, will take medication now and monitor BP. Symptoms: None Frequency: Today Pertinent Negatives: Patient denies  Disposition: [] ED /[] Urgent Care (no appt availability in office) / [] Appointment(In office/virtual)/ []  [place] [facility]/ [x] Home Care/ [] Refused Recommended Disposition /[] [place] [facility]/ []  Follow-up with PCP Additional Notes: Instructed to call back for worsening of symptoms.  Reason for Disposition  Caller has medicine question only, adult not sick, AND triager answers question  Answer Assessment - Initial Assessment Questions 1. NAME of MEDICINE: "What medicine(s) are you calling about?"     Amlodipine, cannot remember if he took his medicine 2. QUESTION: "What is your question?" (e.g., double dose of medicine, side effect)     Should I take it? 3. PRESCRIBER: "Who prescribed the medicine?" Reason: if prescribed by specialist, call should be referred to that group.     Dr. Alvis Lemmings 4. SYMPTOMS: "Do you have any symptoms?" If Yes, ask: "What symptoms are you having?"  "How bad are the symptoms (e.g., mild, moderate, severe)     No 5. PREGNANCY:  "Is there any chance that you are pregnant?" "When was your last menstrual period?"     N/a  Protocols used: Medication Question Call-A-AH

## 2023-01-14 NOTE — Telephone Encounter (Signed)
Refill sent to his pharmacy on amlodipine.

## 2023-01-14 NOTE — Telephone Encounter (Signed)
Routing to PCP for review.

## 2023-01-15 MED ORDER — VITAMIN D (ERGOCALCIFEROL) 1.25 MG (50000 UNIT) PO CAPS: 50000.0000 [IU] | ORAL_CAPSULE | ORAL | 2 refills | Status: AC

## 2023-01-15 NOTE — Telephone Encounter (Signed)
Change of pharmacy Requested Prescriptions  Pending Prescriptions Disp Refills   Vitamin D, Ergocalciferol, (DRISDOL) 1.25 MG (50000 UNIT) CAPS capsule 12 capsule 2    Sig: Take 1 capsule (50,000 Units total) by mouth every Sunday.     Endocrinology:  Vitamins - Vitamin D Supplementation 2 Failed - 01/15/2023 11:53 AM      Failed - Manual Review: Route requests for 50,000 IU strength to the provider      Passed - Ca in normal range and within 360 days    Calcium  Date Value Ref Range Status  12/23/2022 9.0 8.9 - 10.3 mg/dL Final         Passed - Vitamin D in normal range and within 360 days    Vit D, 25-Hydroxy  Date Value Ref Range Status  09/04/2022 51.7 30.0 - 100.0 ng/mL Final    Comment:    Vitamin D deficiency has been defined by the Institute of Medicine and an Endocrine Society practice guideline as a level of serum 25-OH vitamin D less than 20 ng/mL (1,2). The Endocrine Society went on to further define vitamin D insufficiency as a level between 21 and 29 ng/mL (2). 1. IOM (Institute of Medicine). 2010. Dietary reference    intakes for calcium and D. [place]: The    [employer]. 2. Holick MF, Binkley [place], Bischoff-Ferrari HA, et al.    Evaluation, treatment, and prevention of vitamin D    deficiency: an Endocrine Society clinical practice    guideline. JCEM. 2011 Jul; 96(7):1911-30.          Passed - Valid encounter within last 12 months    Recent Outpatient Visits           1 month ago Arthralgia, unspecified joint   [facility] Dept Of [place]. [facility] Hoy Register, MD   4 months ago Essential hypertension   [facility] - A Dept Of [place]. [facility] Storm Frisk, MD   5 months ago Nocturnal leg cramps   [facility] - A Dept Of [place]. [facility], [place] W, NP   5 months ago Bipolar disorder, current episode depressed,  severe, without psychotic features [facility])   [facility] - A Dept Of [place]. [facility] Storm Frisk, MD   9 months ago Displaced fracture of lateral end of left clavicle, initial encounter for closed fracture   [facility] - A Dept Of [place]. [facility] Storm Frisk, MD       Future Appointments             In 1 month Claiborne Rigg, NP [facility] - A Dept Of [place]. [facility]

## 2023-01-18 ENCOUNTER — Encounter: Payer: MEDICAID | Admitting: Gastroenterology

## 2023-01-21 ENCOUNTER — Other Ambulatory Visit: Payer: Self-pay | Admitting: Critical Care Medicine

## 2023-01-22 ENCOUNTER — Ambulatory Visit: Payer: Self-pay

## 2023-01-22 NOTE — Telephone Encounter (Signed)
     Chief Complaint: Pt. Saw blood when wiping. Has happened in the past. Symptoms: Above Frequency: Today x 1 Pertinent Negatives: Patient denies pain Disposition: [] ED /[] Urgent Care (no appt availability in office) / [x] Appointment(In office/virtual)/ []  [place] [facility]/ [] Home Care/ [] Refused Recommended Disposition /[] [place] [facility]/ []  Follow-up with PCP Additional Notes: Agrees with appointment.  Reason for Disposition  [1] Rectal bleeding is minimal (e.g., blood just on toilet paper, few drops, streaks on surface of normal formed BM) AND [2] bleeding recurs 3 or more times on treatment  Answer Assessment - Initial Assessment Questions 1. APPEARANCE of BLOOD: What color is it? Is it passed separately, on the surface of the stool, or mixed in with the stool?      Red 2. AMOUNT: How much blood was passed?      On tissue 3. FREQUENCY: How many times has blood been passed with the stools?      X 1 4. ONSET: When was the blood first seen in the stools? (Days or weeks)      Today 5. DIARRHEA: Is there also some diarrhea? If Yes, ask: How many diarrhea stools in the past 24 hours?      No 6. CONSTIPATION: Do you have constipation? If Yes, ask: How bad is it?     Mild 7. RECURRENT SYMPTOMS: Have you had blood in your stools before? If Yes, ask: When was the last time? and What happened that time?      Yes 8. BLOOD THINNERS: Do you take any blood thinners? (e.g., Coumadin/warfarin, Pradaxa/dabigatran, aspirin )     No 9. OTHER SYMPTOMS: Do you have any other symptoms?  (e.g., abdomen pain, vomiting, dizziness, fever)     No 10. PREGNANCY: Is there any chance you are pregnant? When was your last menstrual period?       N/a  Protocols used: Rectal Bleeding-A-AH

## 2023-01-22 NOTE — Telephone Encounter (Signed)
 Patient asked what type of foods should he be eating with rectal bleeding. Advised patient he should eat a soft diet as well as food rich in iron. Patient verbalized understanding and stated he will keep his appointment as scheduled.

## 2023-01-22 NOTE — Telephone Encounter (Signed)
This encounter was created in error - please disregard.

## 2023-01-24 ENCOUNTER — Telehealth: Payer: Self-pay | Admitting: Gastroenterology

## 2023-01-24 ENCOUNTER — Telehealth: Payer: Self-pay

## 2023-01-24 NOTE — Telephone Encounter (Signed)
 Pt  was called and he states that all his questions were answered.

## 2023-01-24 NOTE — Telephone Encounter (Signed)
 Copied from CRM [id number]. Topic: General - Inquiry >> Jan 24, 2023  9:45 AM Haroldine Laws wrote: Reason for CRM: pt called for instructions for the colonoscopy.  CB@  [phone]

## 2023-01-28 ENCOUNTER — Ambulatory Visit: Payer: MEDICAID | Admitting: Physician Assistant

## 2023-01-29 ENCOUNTER — Telehealth: Payer: Self-pay | Admitting: Gastroenterology

## 2023-01-29 NOTE — Telephone Encounter (Signed)
 Inbound call from patient stating that he is scheduled for a colonoscopy on 1/14 and has questions regarding his prep. Please advise.

## 2023-01-29 NOTE — Telephone Encounter (Signed)
 Spoke with pt all questions answered

## 2023-02-05 ENCOUNTER — Encounter: Payer: Self-pay | Admitting: Gastroenterology

## 2023-02-05 ENCOUNTER — Ambulatory Visit: Payer: MEDICAID | Admitting: Gastroenterology

## 2023-02-05 VITALS — BP 140/95 | HR 66 | Temp 97.9°F | Resp 15 | Ht 71.0 in | Wt 205.0 lb

## 2023-02-05 DIAGNOSIS — K644 Residual hemorrhoidal skin tags: Secondary | ICD-10-CM

## 2023-02-05 DIAGNOSIS — K621 Rectal polyp: Secondary | ICD-10-CM

## 2023-02-05 DIAGNOSIS — D122 Benign neoplasm of ascending colon: Secondary | ICD-10-CM

## 2023-02-05 DIAGNOSIS — Z1211 Encounter for screening for malignant neoplasm of colon: Secondary | ICD-10-CM

## 2023-02-05 DIAGNOSIS — D128 Benign neoplasm of rectum: Secondary | ICD-10-CM

## 2023-02-05 DIAGNOSIS — K648 Other hemorrhoids: Secondary | ICD-10-CM

## 2023-02-05 DIAGNOSIS — K5730 Diverticulosis of large intestine without perforation or abscess without bleeding: Secondary | ICD-10-CM

## 2023-02-05 MED ORDER — SODIUM CHLORIDE 0.9 % IV SOLN: 500.0000 mL | INTRAVENOUS | Status: DC

## 2023-02-05 NOTE — Op Note (Signed)
 [facility] Patient Name: Jeremiah Hunter Procedure Date: 02/05/2023 12:14 PM MRN: [id number] Endoscopist: Gustav ALONSO Mcgee , MD, [id number] Age: 63 Referring MD:  Date of Birth: July 02, 1960 Gender: Male Account #: [id number] Procedure:                Colonoscopy Indications:              Screening for colorectal malignant neoplasm Medicines:                Monitored Anesthesia Care Procedure:                Pre-Anesthesia Assessment:                           - Prior to the procedure, a History and Physical                            was performed, and patient medications and                            allergies were reviewed. The patient's tolerance of                            previous anesthesia was also reviewed. The risks                            and benefits of the procedure and the sedation                            options and risks were discussed with the patient.                            All questions were answered, and informed consent                            was obtained. Prior Anticoagulants: The patient has                            taken no anticoagulant or antiplatelet agents. ASA                            Grade Assessment: III - A patient with severe                            systemic disease. After reviewing the risks and                            benefits, the patient was deemed in satisfactory                            condition to undergo the procedure.                           After obtaining informed consent, the colonoscope  was passed under direct vision. Throughout the                            procedure, the patient's blood pressure, pulse, and                            oxygen saturations were monitored continuously. The                            [id number]-[id number] Colonoscope [id number] was introduced                            through the anus and advanced to the the cecum,                            identified by  appendiceal orifice and ileocecal                            valve. The colonoscopy was performed without                            difficulty. The patient tolerated the procedure                            well. The quality of the bowel preparation was                            good. The ileocecal valve, appendiceal orifice, and                            rectum were photographed. Scope In: 12:18:38 PM Scope Out: 12:38:44 PM Scope Withdrawal Time: 0 hours 12 minutes 38 seconds  Total Procedure Duration: 0 hours 20 minutes 6 seconds  Findings:                 The perianal and digital rectal examinations were                            normal.                           Two sessile polyps were found in the rectum and                            cecum. The polyps were 5 to 7 mm in size. These                            polyps were removed with a cold snare. Resection                            and retrieval were complete.                           Scattered small-mouthed diverticula were found in  the sigmoid colon and descending colon.                           Non-bleeding external and internal hemorrhoids were                            found during retroflexion. The hemorrhoids were                            medium-sized. Complications:            No immediate complications. Estimated Blood Loss:     Estimated blood loss was minimal. Impression:               - Two 5 to 7 mm polyps in the rectum and in the                            cecum, removed with a cold snare. Resected and                            retrieved.                           - Diverticulosis in the sigmoid colon and in the                            descending colon.                           - Non-bleeding external and internal hemorrhoids. Recommendation:           - Patient has a contact number available for                            emergencies. The signs and symptoms of potential                             delayed complications were discussed with the                            patient. Return to normal activities tomorrow.                            Written discharge instructions were provided to the                            patient.                           - Resume previous diet.                           - Continue present medications.                           - Await pathology results.                           -  Repeat colonoscopy in 5-10 years for surveillance                            based on pathology results. Neytiri Asche V. Burlon Centrella, MD 02/05/2023 12:44:20 PM This report has been signed electronically.

## 2023-02-05 NOTE — Progress Notes (Signed)
 Sedate, gd SR, tolerated procedure well, VSS, report to RN
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
## 2023-02-05 NOTE — Progress Notes (Signed)
 Called to room to assist during endoscopic procedure.  Patient ID and intended procedure confirmed with present staff. Received instructions for my participation in the procedure from the performing physician.

## 2023-02-05 NOTE — Patient Instructions (Signed)
Handouts provided on polyps, diverticulosis and hemorrhoids.   Resume previous diet. Continue present medications.  Await pathology results.  Repeat colonoscopy in 5-10 years for surveillance based on pathology results.   YOU HAD AN ENDOSCOPIC PROCEDURE TODAY AT [facility]:   Refer to the procedure report that was given to you for any specific questions about what was found during the examination.  If the procedure report does not answer your questions, please call your gastroenterologist to clarify.  If you requested that your care partner not be given the details of your procedure findings, then the procedure report has been included in a sealed envelope for you to review at your convenience later.  YOU SHOULD EXPECT: Some feelings of bloating in the abdomen. Passage of more gas than usual.  Walking can help get rid of the air that was put into your GI tract during the procedure and reduce the bloating. If you had a lower endoscopy (such as a colonoscopy or flexible sigmoidoscopy) you may notice spotting of blood in your stool or on the toilet paper. If you underwent a bowel prep for your procedure, you may not have a normal bowel movement for a few days.  Please Note:  You might notice some irritation and congestion in your nose or some drainage.  This is from the oxygen used during your procedure.  There is no need for concern and it should clear up in a day or so.  SYMPTOMS TO REPORT IMMEDIATELY:  Following lower endoscopy (colonoscopy or flexible sigmoidoscopy):  Excessive amounts of blood in the stool  Significant tenderness or worsening of abdominal pains  Swelling of the abdomen that is new, acute  Fever of 100F or higher  For urgent or emergent issues, a gastroenterologist can be reached at any hour by calling [phone]. Do not use MyChart messaging for urgent concerns.    DIET:  We do recommend a small meal at first, but then you may proceed to your regular  diet.  Drink plenty of fluids but you should avoid alcoholic beverages for 24 hours.  ACTIVITY:  You should plan to take it easy for the rest of today and you should NOT DRIVE or use heavy machinery until tomorrow (because of the sedation medicines used during the test).    FOLLOW UP: Our staff will call the number listed on your records the next business day following your procedure.  We will call around 7:15- 8:00 am to check on you and address any questions or concerns that you may have regarding the information given to you following your procedure. If we do not reach you, we will leave a message.     If any biopsies were taken you will be contacted by phone or by letter within the next 1-3 weeks.  Please call us at [phone] if you have not heard about the biopsies in 3 weeks.    SIGNATURES/CONFIDENTIALITY: You and/or your care partner have signed paperwork which will be entered into your electronic medical record.  These signatures attest to the fact that that the information above on your After Visit Summary has been reviewed and is understood.  Full responsibility of the confidentiality of this discharge information lies with you and/or your care-partner.

## 2023-02-05 NOTE — Progress Notes (Signed)
 Pt's states no medical or surgical changes since previsit or office visit.

## 2023-02-06 ENCOUNTER — Telehealth: Payer: Self-pay | Admitting: *Deleted

## 2023-02-06 ENCOUNTER — Telehealth: Payer: Self-pay | Admitting: Family Medicine

## 2023-02-06 NOTE — Telephone Encounter (Signed)
 Routing to PCP for review.  Would patient qualify?

## 2023-02-06 NOTE — Telephone Encounter (Signed)
 I do not have any medical justification since I have just seen him on one occasion.  What would the indication for walker be?  Thank you.

## 2023-02-06 NOTE — Telephone Encounter (Signed)
  Follow up Call-     02/05/2023   11:21 AM  Call back number  Post procedure Call Back phone  # [phone]  Permission to leave phone message Yes     Patient questions:  Do you have a fever, pain , or abdominal swelling? No. Pain Score  0 *  Have you tolerated food without any problems? Yes.    Have you been able to return to your normal activities? Yes.    Do you have any questions about your discharge instructions: Diet   No. Medications  No. Follow up visit  No.  Do you have questions or concerns about your Care? No.  Actions: * If pain score is 4 or above: No action needed, pain <4.

## 2023-02-06 NOTE — Telephone Encounter (Signed)
 Copied from CRM [id number]. Topic: General - Other >> Feb 06, 2023 11:03 AM May Sparks wrote: Pt Jeremiah Hunter was given his grandmothers Rollator to use, it broke on him (not injured) . He is calling to see if PCP Jeremiah Hunter or office can provide a new one. Please advise

## 2023-02-07 ENCOUNTER — Telehealth: Payer: Self-pay

## 2023-02-07 NOTE — Telephone Encounter (Signed)
Copied from CRM [id number]. Topic: General - Other >> Feb 06, 2023  3:07 PM Franchot Heidelberg wrote: Reason for CRM: Pt called requesting a doctor's note for his job stating that he is unable to eat greasy foods because of his medical conditions. He says he is unable to pizza because it upsets his stomach. Please upload doctor's note via MyChart.  Please address Jacquelyne Balint at [facility] [address] [place], [place] [address]

## 2023-02-08 ENCOUNTER — Telehealth: Payer: Self-pay | Admitting: Gastroenterology

## 2023-02-08 LAB — SURGICAL PATHOLOGY

## 2023-02-08 NOTE — Telephone Encounter (Signed)
Patient has upcoming appointment and can discuss need for walker.

## 2023-02-08 NOTE — Telephone Encounter (Signed)
I have only seen this patient once and I have no record of such in his chart.  I am unable to provide such letters.  He may need to discuss dietary needs with his employer.

## 2023-02-08 NOTE — Telephone Encounter (Signed)
PT has irregular BM since having colonoscopy and he is very concerned. Please advise.

## 2023-02-08 NOTE — Telephone Encounter (Signed)
Routing to provider for review.

## 2023-02-08 NOTE — Telephone Encounter (Signed)
Pt was called and informed of PCP response. 

## 2023-02-11 NOTE — Telephone Encounter (Signed)
Spoke with the patient. Bowels are moving now. No intervention needed.

## 2023-02-20 ENCOUNTER — Other Ambulatory Visit: Payer: Self-pay | Admitting: Critical Care Medicine

## 2023-02-20 DIAGNOSIS — G4762 Sleep related leg cramps: Secondary | ICD-10-CM

## 2023-02-27 ENCOUNTER — Encounter: Payer: Self-pay | Admitting: Gastroenterology

## 2023-02-28 ENCOUNTER — Ambulatory Visit (INDEPENDENT_AMBULATORY_CARE_PROVIDER_SITE_OTHER): Payer: MEDICAID | Admitting: Gastroenterology

## 2023-02-28 ENCOUNTER — Encounter: Payer: Self-pay | Admitting: Gastroenterology

## 2023-02-28 DIAGNOSIS — K648 Other hemorrhoids: Secondary | ICD-10-CM | POA: Diagnosis not present

## 2023-02-28 DIAGNOSIS — R1310 Dysphagia, unspecified: Secondary | ICD-10-CM

## 2023-02-28 DIAGNOSIS — K641 Second degree hemorrhoids: Secondary | ICD-10-CM

## 2023-02-28 DIAGNOSIS — K921 Melena: Secondary | ICD-10-CM | POA: Diagnosis not present

## 2023-02-28 DIAGNOSIS — K5904 Chronic idiopathic constipation: Secondary | ICD-10-CM

## 2023-02-28 DIAGNOSIS — K625 Hemorrhage of anus and rectum: Secondary | ICD-10-CM

## 2023-02-28 DIAGNOSIS — R1319 Other dysphagia: Secondary | ICD-10-CM

## 2023-02-28 MED ORDER — HYDROCORTISONE ACE-PRAMOXINE 1-1 % EX CREA: 1.0000 | TOPICAL_CREAM | Freq: Two times a day (BID) | CUTANEOUS | 1 refills | Status: DC

## 2023-02-28 NOTE — Patient Instructions (Addendum)
 _______________________________________________________  If your blood pressure at your visit was 140/90 or greater, please contact your primary care physician to follow up on this.  _______________________________________________________  If you are age 61 or older, your body mass index should be between 23-30. Your Body mass index is 27.75 kg/m. If this is out of the aforementioned range listed, please consider follow up with your Primary Care Provider.  If you are age 14 or younger, your body mass index should be between 19-25. Your Body mass index is 27.75 kg/m. If this is out of the aformentioned range listed, please consider follow up with your Primary Care Provider.   ________________________________________________________  The [facility] providers would like to encourage you to use MYCHART to communicate with providers for non-urgent requests or questions.  Due to long hold times on the telephone, sending your provider a message by [facility] may be a faster and more efficient way to get a response.  Please allow 48 business hours for a response.  Please remember that this is for non-urgent requests.  _______________________________________________________  Rosine have been scheduled for an endoscopy. Please follow written instructions given to you at your visit today.  If you use inhalers (even only as needed), please bring them with you on the day of your procedure.  If you take any of the following medications, they will need to be adjusted prior to your procedure:   DO NOT TAKE 7 DAYS PRIOR TO TEST- Trulicity (dulaglutide) Ozempic, Wegovy (semaglutide) Mounjaro (tirzepatide) Bydureon Bcise (exanatide extended release)  DO NOT TAKE 1 DAY PRIOR TO YOUR TEST Rybelsus (semaglutide) Adlyxin (lixisenatide) Victoza (liraglutide) Byetta (exanatide) ___________________________________________________________________________  We have sent the following medications to your pharmacy  for you to pick up at your convenience:  START: ProctoCream per rectum two times daily for 5 days    Due to recent changes in healthcare laws, you may see the results of your imaging and laboratory studies on MyChart before your provider has had a chance to review them.  We understand that in some cases there may be results that are confusing or concerning to you. Not all laboratory results come back in the same time frame and the provider may be waiting for multiple results in order to interpret others.  Please give us  48 hours in order for your provider to thoroughly review all the results before contacting the office for clarification of your results.   VISIT SUMMARY:  Today, we discussed your episodes of choking and recent black stool. We reviewed your symptoms and planned the next steps for diagnosis and treatment.  YOUR PLAN:  -INTERMITTENT DYSPHAGIA: Intermittent dysphagia means occasional difficulty swallowing. You experience choking and coughing while chewing about once a month. We recommend an upper endoscopy to examine your esophagus and stomach lining. This procedure involves inserting a flexible tube with a camera to check for any issues, and if needed, we will stretch the esophagus. You will be sedated during the procedure.  -HEMORRHOIDS: Hemorrhoids are swollen veins in the lower rectum and anus that can cause bleeding and discomfort. You have internal hemorrhoids, and we discussed the recent episode of black stool, which could be related to gastrointestinal bleeding. We recommend using stool softeners and hemorrhoidal cream to manage your symptoms. The black stool will be evaluated during the upper endoscopy.  Thank you for entrusting me with your care and choosing [facility].  Dr Shila
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
## 2023-03-01 ENCOUNTER — Encounter: Payer: Self-pay | Admitting: Gastroenterology

## 2023-03-12 ENCOUNTER — Ambulatory Visit: Payer: MEDICAID | Admitting: Nurse Practitioner

## 2023-03-14 ENCOUNTER — Encounter: Payer: Self-pay | Admitting: Neurology

## 2023-03-14 ENCOUNTER — Ambulatory Visit (INDEPENDENT_AMBULATORY_CARE_PROVIDER_SITE_OTHER): Payer: MEDICAID | Admitting: Neurology

## 2023-03-14 VITALS — BP 136/94 | HR 76 | Ht 71.0 in | Wt 205.8 lb

## 2023-03-14 DIAGNOSIS — R252 Cramp and spasm: Secondary | ICD-10-CM | POA: Diagnosis not present

## 2023-03-14 DIAGNOSIS — M5136 Other intervertebral disc degeneration, lumbar region with discogenic back pain only: Secondary | ICD-10-CM

## 2023-03-14 DIAGNOSIS — G5711 Meralgia paresthetica, right lower limb: Secondary | ICD-10-CM | POA: Insufficient documentation

## 2023-03-14 MED ORDER — QC DAILY MULTIVIT/MULTIMINERAL PO TABS: ORAL_TABLET | ORAL | Status: AC

## 2023-03-14 NOTE — Patient Instructions (Addendum)
Sciatica Rehab Ask your health care provider which exercises are safe for you. Do exercises exactly as told by your health care provider and adjust them as directed. It is normal to feel mild stretching, pulling, tightness, or discomfort as you do these exercises. Stop right away if you feel sudden pain or your pain gets worse. Do not begin these exercises until told by your health care provider. Stretching and range-of-motion exercises These exercises warm up your muscles and joints and improve the movement and flexibility of your hips and back. These exercises also help to relieve pain, numbness, and tingling. Sciatic nerve glide  Sit in a chair with your head facing down toward your chest. Place your hands behind your back. Let your shoulders slump forward. Slowly straighten one of your legs while you tilt your head back as if you are looking toward the ceiling. Only straighten your leg as far as you can without making your symptoms worse. Hold this position for __________ seconds. Slowly return your leg and head back to the starting position. Repeat with your other leg. Repeat __________ times. Complete this exercise __________ times a day. Knee to chest with hip adduction and internal rotation  Lie on your back on a firm surface with both legs straight. Bend one of your knees and move it up toward your chest until you feel a gentle stretch in your lower back and buttock. Then, move your knee toward the shoulder that is on the opposite side from your leg. This is hip adduction and internal rotation. Hold your leg in this position by holding on to the front of your knee. Hold this position for __________ seconds. Slowly return to the starting position. Repeat with your other leg. Repeat __________ times. Complete this exercise __________ times a day. Prone extension on elbows  Lie on your abdomen on a firm surface. A bed may be too soft for this exercise. Prop yourself up on your  elbows. Use your arms to help lift your chest up until you feel a gentle stretch in your abdomen and your lower back. This will place some of your body weight on your elbows. If this is uncomfortable, try stacking pillows under your chest. Your hips should stay down, against the surface that you are lying on. Keep your hip and back muscles relaxed. Hold this position for __________ seconds. Slowly relax your upper body and return to the starting position. Repeat __________ times. Complete this exercise __________ times a day. Strengthening exercises These exercises build strength and endurance in your back. Endurance is the ability to use your muscles for a long time, even after they get tired. Pelvic tilt This exercise strengthens the muscles that lie deep in the abdomen. Lie on your back on a firm surface. Bend your knees and keep your feet flat on the surface. Tense your abdominal muscles. Tip your pelvis up toward the ceiling and flatten your lower back into the firm surface. To help with this exercise, you may place a small towel under your lower back and try to push your back into the towel. Hold this position for __________ seconds. Let your muscles relax completely before you repeat this exercise. Repeat __________ times. Complete this exercise __________ times a day. Alternating arm and leg raises  Get on your hands and knees on a firm surface. If you are on a hard floor, you may want to use padding, such as an exercise mat, to cushion your knees. Line up your arms and legs.  Your hands should be directly below your shoulders, and your knees should be directly below your hips. Lift your left leg behind you. At the same time, raise your right arm and straighten it in front of you. Do not lift your leg higher than your hip. Do not lift your arm higher than your shoulder. Keep your abdominal and back muscles tight. Keep your hips facing the ground. Do not arch your back. Keep your  balance carefully, and do not hold your breath. Hold this position for __________ seconds. Slowly return to the starting position. Repeat with your right leg and your left arm. Repeat __________ times. Complete this exercise __________ times a day. Posture and body mechanics Good posture and healthy body mechanics can help to relieve stress in your body's tissues and joints. Body mechanics refers to the movements and positions of your body while you do your daily activities. Posture is part of body mechanics. Good posture means: Your spine is in its natural S-curve position (neutral). Your shoulders are pulled back slightly. Your head is not tipped forward. Follow these guidelines to improve your posture and body mechanics in your everyday activities. Standing  When standing, keep your spine neutral and your feet about hip width apart. Keep a slight bend in your knees. Your ears, shoulders, and hips should line up. When you do a task in which you stand in one place for a long time, place one foot up on a stable object that is 2-4 inches (5-10 cm) high, such as a footstool. This helps keep your spine neutral. Sitting  When sitting, keep your spine neutral and keep your feet flat on the floor. Use a footrest, if necessary, and keep your thighs parallel to the floor. Avoid rounding your shoulders, and avoid tilting your head forward. When working at a desk or a computer, keep your desk at a height where your hands are slightly lower than your elbows. Slide your chair under your desk so you are close enough to maintain good posture. When working at a computer, place your monitor at a height where you are looking straight ahead and you do not have to tilt your head forward or downward to look at the screen. Resting  When lying down and resting, avoid positions that are most painful for you. If you have pain with activities such as sitting, bending, stooping, or squatting, lie in a position in which  your body does not bend very much. For example, avoid curling up on your side with your arms and knees near your chest (fetal position). If you have pain with activities such as standing for a long time or reaching with your arms, lie with your spine in a neutral position and bend your knees slightly. Try the following positions: Lying on your side with a pillow between your knees. Lying on your back with a pillow under your knees. Lifting  When lifting objects, keep your feet at least shoulder width apart and tighten your abdominal muscles. Bend your knees and hips and keep your spine neutral. It is important to lift using the strength of your legs, not your back. Do not lock your knees straight out. Always ask for help to lift heavy or awkward objects. This information is not intended to replace advice given to you by your health care provider. Make sure you discuss any questions you have with your health care provider. Document Revised: 04/18/2021 Document Reviewed: 04/18/2021 Elsevier Patient Education  2024 [employer]  Assessment: Total time  for face to face interview and examination, for review of  images and laboratory testing, neurophysiology testing and pre-existing records, including out-of -network , was 35 minutes. Assessment is as follows here:  1)  Jeremiah Hunter has a dx of meralgia paresthetica and symptoms described today were already evaluated in 2021.  I will not repeat a NCV and EMG as we do offer these services only for pre-operative patients.    2) neuropathy may have developed over the last 4 years, but his pin and needles only affect the right foot.  He felt vibration stornger on the left foot and toes.   I reviewed his labs and his medication list:   He has normal pulses in both ankles and capillary refill is normal in all toes.  He has normal metabolic panels, CBC, TSH, ANA and Vit D.  He takes valproic acid, but is subtherapeutic.  He had a normal colonoscopy.     Plan:  Treatment plan and additional workup planned after today includes:   1)  already on gabapentin.  That's usually the first medication to help with neuropathic changes. He reports being sleepy on it ( but there are many other meds to cause sleepiness ) , and I recommend to take the Gabapentin 300 mg at Lunch. 600 mg at bedtime.  2)  I recommend an over the counter multi-mineral in addition to the multivitamin he takes.   3) lumbar arthritic changes without radiculopathic  pain-  Please  start back exercises.    no follow up needed .
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
## 2023-03-14 NOTE — Addendum Note (Signed)
Addended by: Melvyn Novas on: 03/14/2023 04:16 PM   Modules accepted: Orders

## 2023-03-15 ENCOUNTER — Ambulatory Visit (AMBULATORY_SURGERY_CENTER): Payer: MEDICAID | Admitting: Gastroenterology

## 2023-03-15 ENCOUNTER — Encounter: Payer: Self-pay | Admitting: Gastroenterology

## 2023-03-15 VITALS — BP 106/72 | HR 62 | Temp 97.6°F | Resp 13 | Ht 71.0 in | Wt 199.0 lb

## 2023-03-15 DIAGNOSIS — K2970 Gastritis, unspecified, without bleeding: Secondary | ICD-10-CM | POA: Diagnosis not present

## 2023-03-15 DIAGNOSIS — R1319 Other dysphagia: Secondary | ICD-10-CM

## 2023-03-15 DIAGNOSIS — R1310 Dysphagia, unspecified: Secondary | ICD-10-CM

## 2023-03-15 DIAGNOSIS — K31A19 Gastric intestinal metaplasia without dysplasia, unspecified site: Secondary | ICD-10-CM

## 2023-03-15 DIAGNOSIS — K2100 Gastro-esophageal reflux disease with esophagitis, without bleeding: Secondary | ICD-10-CM

## 2023-03-15 HISTORY — PX: ESOPHAGOGASTRODUODENOSCOPY: SHX1529

## 2023-03-15 MED ORDER — PANTOPRAZOLE SODIUM 40 MG PO TBEC: 40.0000 mg | DELAYED_RELEASE_TABLET | Freq: Every day | ORAL | 3 refills | Status: DC

## 2023-03-15 MED ORDER — SODIUM CHLORIDE 0.9 % IV SOLN: 500.0000 mL | INTRAVENOUS | Status: DC

## 2023-03-15 NOTE — Op Note (Signed)
[facility] Patient Name: Jeremiah Hunter Procedure Date: 03/15/2023 10:00 AM MRN: [id number] Endoscopist: Napoleon Form , MD, [id number] Age: 63 Referring MD:  Date of Birth: Jul 24, 1960 Gender: Male Account #: [id number] Procedure:                Upper GI endoscopy Indications:              Dysphagia Medicines:                Monitored Anesthesia Care Procedure:                Pre-Anesthesia Assessment:                           - Prior to the procedure, a History and Physical                            was performed, and patient medications and                            allergies were reviewed. The patient's tolerance of                            previous anesthesia was also reviewed. The risks                            and benefits of the procedure and the sedation                            options and risks were discussed with the patient.                            All questions were answered, and informed consent                            was obtained. Prior Anticoagulants: The patient has                            taken no anticoagulant or antiplatelet agents. ASA                            Grade Assessment: III - A patient with severe                            systemic disease. After reviewing the risks and                            benefits, the patient was deemed in satisfactory                            condition to undergo the procedure.                           After obtaining informed consent, the endoscope was  passed under direct vision. Throughout the                            procedure, the patient's blood pressure, pulse, and                            oxygen saturations were monitored continuously. The                            Olympus Scope [id number] was introduced through the                            mouth, and advanced to the second part of duodenum.                            The upper GI endoscopy was  accomplished without                            difficulty. The patient tolerated the procedure                            well. Scope In: Scope Out: Findings:                 LA Grade B (one or more mucosal breaks greater than                            5 mm, not extending between the tops of two mucosal                            folds) esophagitis with no bleeding, mucosal                            nodularity was found 34 to 36 cm from the incisors.                            Biopsies were taken with a cold forceps for                            histology.                           No other endoscopic abnormality was evident in the                            esophagus to explain the patient's complaint of                            dysphagia. It was decided, however, to proceed with                            dilation of the entire esophagus. The scope was  withdrawn. Dilation was performed with a Maloney                            dilator with no resistance at 54 Fr. The dilation                            site was examined following endoscope reinsertion                            and showed no change.                           Patchy mild inflammation characterized by                            congestion (edema), erythema and friability was                            found in the entire examined stomach. Biopsies were                            taken with a cold forceps for Helicobacter pylori                            testing.                           The cardia and gastric fundus were normal on                            retroflexion.                           The examined duodenum was normal. Complications:            No immediate complications. Estimated Blood Loss:     Estimated blood loss was minimal. Impression:               - LA Grade B reflux esophagitis with no bleeding.                            Biopsied.                           - No  endoscopic esophageal abnormality to explain                            patient's dysphagia. Esophagus dilated. Dilated.                           - Gastritis. Biopsied.                           - Normal examined duodenum. Recommendation:           - Patient has a contact number available for  emergencies. The signs and symptoms of potential                            delayed complications were discussed with the                            patient. Return to normal activities tomorrow.                            Written discharge instructions were provided to the                            patient.                           - Resume previous diet.                           - Continue present medications.                           - Await pathology results.                           - Follow an antireflux regimen.                           - Use Protonix (pantoprazole) 40 mg PO daily, 30                            minutes before dinner. Rx for 90 days with 3 refills Napoleon Form, MD 03/15/2023 10:35:14 AM This report has been signed electronically.

## 2023-03-15 NOTE — Progress Notes (Signed)
 A/O x 3, gd SR's, VSS, report to RN

## 2023-03-15 NOTE — Progress Notes (Signed)
 Pt's states no medical or surgical changes since previsit or office visit.

## 2023-03-15 NOTE — Patient Instructions (Addendum)
Take your protonix 1/2 hour before supper everyday.  It's on [facility].  Resume all of your previous medications as ordered.  Read your discharge instructions.  YOU HAD AN ENDOSCOPIC PROCEDURE TODAY AT THE [facility]:   Refer to the procedure report that was given to you for any specific questions about what was found during the examination.  If the procedure report does not answer your questions, please call your gastroenterologist to clarify.  If you requested that your care partner not be given the details of your procedure findings, then the procedure report has been included in a sealed envelope for you to review at your convenience later.  YOU SHOULD EXPECT: Some feelings of bloating in the abdomen. Passage of more gas than usual.    Please Note:  You might notice some irritation and congestion in your nose or some drainage.  This is from the oxygen used during your procedure.  There is no need for concern and it should clear up in a day or so.  SYMPTOMS TO REPORT IMMEDIATELY:   Following upper endoscopy (EGD)  Vomiting of blood or coffee ground material  New chest pain or pain under the shoulder blades  Painful or persistently difficult swallowing  New shortness of breath  Fever of 100F or higher  Black, tarry-looking stools  For urgent or emergent issues, a gastroenterologist can be reached at any hour by calling [phone]. Do not use MyChart messaging for urgent concerns.    DIET:  We do recommend a small meal at first, but then you may proceed to your regular diet.  Drink plenty of fluids but you should avoid alcoholic beverages for 24 hours.  ACTIVITY:  You should plan to take it easy for the rest of today and you should NOT DRIVE or use heavy machinery until tomorrow (because of the sedation medicines used during the test).    FOLLOW UP: Our staff will call the number listed on your records the next business day following your procedure.  We will call  around 7:15- 8:00 am to check on you and address any questions or concerns that you may have regarding the information given to you following your procedure. If we do not reach you, we will leave a message.     If any biopsies were taken you will be contacted by phone or by letter within the next 1-3 weeks.  Please call us at [phone] if you have not heard about the biopsies in 3 weeks.    SIGNATURES/CONFIDENTIALITY: You and/or your care partner have signed paperwork which will be entered into your electronic medical record.  These signatures attest to the fact that that the information above on your After Visit Summary has been reviewed and is understood.  Full responsibility of the confidentiality of this discharge information lies with you and/or your care-partner.

## 2023-03-15 NOTE — Progress Notes (Signed)
Patient is a poor historian.  He changed his mind about his pharmacy several times.  [facility] was chosen.  Patient Will remain in the recovery area until ride  is here.  Patient did have assistance getting dressed.

## 2023-03-15 NOTE — Progress Notes (Signed)
Please refer to office visit note 02/28/2023. No additional changes in H&P Patient is appropriate for planned procedure(s) and anesthesia in an ambulatory setting  K. Scherry Ran , MD [phone]

## 2023-03-15 NOTE — Progress Notes (Signed)
 Called to room to assist during endoscopic procedure.  Patient ID and intended procedure confirmed with present staff. Received instructions for my participation in the procedure from the performing physician.

## 2023-03-18 ENCOUNTER — Other Ambulatory Visit: Payer: Self-pay | Admitting: Critical Care Medicine

## 2023-03-18 ENCOUNTER — Telehealth: Payer: Self-pay

## 2023-03-18 NOTE — Telephone Encounter (Signed)
  Follow up Call-     03/15/2023    9:30 AM 02/05/2023   11:21 AM  Call back number  Post procedure Call Back phone  # [phone]  Permission to leave phone message No Yes     Patient questions:  Do you have a fever, pain , or abdominal swelling? No. Pain Score  0 *  Have you tolerated food without any problems? Yes.    Have you been able to return to your normal activities? Yes.    Do you have any questions about your discharge instructions: Diet   No. Medications  No. Follow up visit  No.  Do you have questions or concerns about your Care? No.  Actions: * If pain score is 4 or above: No action needed, pain <4.

## 2023-03-19 LAB — SURGICAL PATHOLOGY

## 2023-03-26 ENCOUNTER — Telehealth: Payer: Self-pay

## 2023-03-26 NOTE — Telephone Encounter (Signed)
 Per epic there is no note regarding phone call.   Copied from CRM [id number]. Topic: General - Call Back - No Documentation >> Mar 22, 2023 11:27 AM Jeremiah Hunter wrote: Reason for CRM: Pt called reporting that he missed a call from the office an hour ago. Requesting a call back   Best contact: [phone]

## 2023-03-27 ENCOUNTER — Telehealth: Payer: MEDICAID | Admitting: Family Medicine

## 2023-04-04 ENCOUNTER — Ambulatory Visit: Payer: MEDICAID | Admitting: General Surgery

## 2023-04-09 ENCOUNTER — Ambulatory Visit: Payer: MEDICAID | Admitting: General Surgery

## 2023-04-10 ENCOUNTER — Ambulatory Visit: Payer: MEDICAID | Admitting: Podiatry

## 2023-04-10 ENCOUNTER — Other Ambulatory Visit: Payer: Self-pay | Admitting: Critical Care Medicine

## 2023-04-10 ENCOUNTER — Other Ambulatory Visit: Payer: Self-pay | Admitting: Gastroenterology

## 2023-04-11 ENCOUNTER — Ambulatory Visit: Payer: MEDICAID | Admitting: Podiatry

## 2023-04-11 DIAGNOSIS — L600 Ingrowing nail: Secondary | ICD-10-CM

## 2023-04-12 NOTE — Progress Notes (Signed)
 Subjective:   Patient ID: Jeremiah Hunter, male   DOB: 63 y.o.   MRN: [id number]   HPI Patient presents with ingrown toenail deformities that can be bothersome and is not sure at 1 point whether they should be fixed   ROS      Objective:  Physical Exam  Neuro vascular status intact incurvation nailbeds bilateral hallux mild to moderate discomfort noted     Assessment:  Chronic ingrown toenail deformity localized     Plan:  Reviewed with patient and discussed the possibility for nail removal or border removal in future hold off currently debridement courtesy accomplished and will be seen back as needed

## 2023-04-15 ENCOUNTER — Telehealth: Payer: Self-pay | Admitting: Gastroenterology

## 2023-04-15 NOTE — Telephone Encounter (Signed)
 Patient states a "little ache" after eating. Pain is on the right and left of his "belly." He has not taken anything for his symptoms. Some burping and belching with this pain.  Pain is not every day. It lasts "for a long time when I get it." No constipation. Reports he goes "about every day, but I may skip a day once in a while."  Confirmed he is taking pantoprazole. He takes it in the early afternoon. I instructed his to change pantoprazole to 30 minutes before first meal of the day. Do you want him to increase it to BID?

## 2023-04-15 NOTE — Telephone Encounter (Signed)
 PT is requesting to speak with a nurse regarding pain after eating. PT has scheduled an OV for 5/19 but would like recommendations to keep him comfortable until then. Please advise.

## 2023-04-16 ENCOUNTER — Encounter: Payer: Self-pay | Admitting: Allergy

## 2023-04-19 ENCOUNTER — Other Ambulatory Visit: Payer: Self-pay

## 2023-04-19 MED ORDER — PANTOPRAZOLE SODIUM 40 MG PO TBEC: DELAYED_RELEASE_TABLET | ORAL | 1 refills | Status: DC

## 2023-04-19 NOTE — Telephone Encounter (Signed)
 Spoke with the patient. He is feeling somewhat better since starting his day with pantoprazole. He is interested in trying it BID. Asks for anew prescription to be sent to his pharmacy, [facility]

## 2023-04-19 NOTE — Telephone Encounter (Signed)
 Agree with increasing pantoprazole to 40 mg twice daily before breakfast and dinner.  Please try to bring him in sooner if we have any cancellations.  Thank you

## 2023-05-03 ENCOUNTER — Encounter: Payer: Self-pay | Admitting: Allergy

## 2023-05-03 ENCOUNTER — Ambulatory Visit (INDEPENDENT_AMBULATORY_CARE_PROVIDER_SITE_OTHER): Payer: MEDICAID | Admitting: Allergy

## 2023-05-03 ENCOUNTER — Other Ambulatory Visit: Payer: Self-pay

## 2023-05-03 VITALS — BP 124/86 | HR 88 | Temp 98.3°F

## 2023-05-03 DIAGNOSIS — H1013 Acute atopic conjunctivitis, bilateral: Secondary | ICD-10-CM

## 2023-05-03 DIAGNOSIS — J3089 Other allergic rhinitis: Secondary | ICD-10-CM

## 2023-05-03 MED ORDER — AZELASTINE-FLUTICASONE 137-50 MCG/ACT NA SUSP: 1.0000 | Freq: Two times a day (BID) | NASAL | 5 refills | Status: DC | PRN

## 2023-05-03 MED ORDER — LEVOCETIRIZINE DIHYDROCHLORIDE 5 MG PO TABS
5.0000 mg | ORAL_TABLET | Freq: Every evening | ORAL | 5 refills | Status: DC
Start: 2023-05-03 — End: 2023-10-22

## 2023-05-03 MED ORDER — CROMOLYN SODIUM 4 % OP SOLN: 1.0000 [drp] | Freq: Four times a day (QID) | OPHTHALMIC | 5 refills | Status: DC | PRN

## 2023-05-03 NOTE — Progress Notes (Signed)
 Follow-up Note  RE: Jeremiah Hunter MRN: [id number] DOB: 18-Feb-1960 Date of Office Visit: 05/03/2023   History of present illness: Jeremiah Hunter is a 63 y.o. male presenting today for follow-up of allergic rhinitis with conjunctivitis.  He was last seen in the office on 10/24/22 by myself.   Discussed the use of AI scribe software for clinical note transcription with the patient, who gave verbal consent to proceed.  Since January, he has experienced increased allergy symptoms, including a sore tongue, nasal congestion, rhinorrhea, sneezing, and itchy, watery eyes. These symptoms have worsened since. Initially, he experienced mild headaches at the occipital region, which have since resolved. He has a history of mold exposure at his previous residence.  He was using Xyzal which he believes provides some relief, although he has not been taking it due to confusion with his medications and being out of it. He is unsure if he still has some at home.  He also had cromolyn eye drop and dymista nasal spray however not currently using these either.   Review of systems: 10pt ROS negative unless noted above in HPI   Past medical/social/surgical/family history have been reviewed and are unchanged unless specifically indicated below.  No changes  Medication List: Current Outpatient Medications  Medication Sig Dispense Refill   amLODipine (NORVASC) 5 MG tablet Take 1 tablet (5 mg total) by mouth daily. TAKE 1 TABLET(5 MG) BY MOUTH DAILY 90 tablet 0   azelastine (ASTELIN) 0.1 % nasal spray PLACE TWO SPRAYS INTO BOTH NOSTRILS TWO (TWO) TIMES DAILY. USE IN EACH NOSTRIL AS DIRECTED 30 mL 0   cyclobenzaprine (FLEXERIL) 5 MG tablet TAKE 1 TABLET (5 MG TOTAL) BY MOUTH AT BEDTIME. FOR MUSCLE CRAMPS 30 tablet 0   divalproex (DEPAKOTE ER) 500 MG 24 hr tablet Take 2 tablets (1,000 mg total) by mouth daily. 120 tablet 2   FLUoxetine (PROZAC) 20 MG capsule Take by mouth.     fluticasone (FLONASE) 50 MCG/ACT nasal  spray Place 2 sprays into both nostrils daily. 16 g 5   gabapentin (NEURONTIN) 300 MG capsule TAKE TWO CAPSULES BY MOUTH THREE TIMES A DAY 180 capsule 3   meloxicam (MOBIC) 7.5 MG tablet TAKE ONE TABLET BY MOUTH DAILY 30 tablet 0   Multiple Vitamins-Minerals (QC DAILY MULTIVIT/MULTIMINERAL) TABS Bid po     pantoprazole (PROTONIX) 40 MG tablet One tablet by mouth ac breakfast and ac supper 180 tablet 1   Vitamin D, Ergocalciferol, (DRISDOL) 1.25 MG (50000 UNIT) CAPS capsule Take 1 capsule (50,000 Units total) by mouth every Sunday. 12 capsule 2   cromolyn (OPTICROM) 4 % ophthalmic solution Place 1 drop into both eyes daily. (Patient not taking: Reported on 05/03/2023) 1 mL 3   levocetirizine (XYZAL) 5 MG tablet Take 1 tablet (5 mg total) by mouth every evening. (Patient not taking: Reported on 05/03/2023) 30 tablet 5   No current facility-administered medications for this visit.     Known medication allergies: No Known Allergies   Physical examination: Blood pressure 124/86, pulse 88, temperature 98.3 F (36.8 C), SpO2 96%.  General: Alert, interactive, in no acute distress. HEENT: PERRLA, TMs pearly gray, turbinates moderately edematous with clear discharge, post-pharynx non erythematous. Neck: Supple without lymphadenopathy. Lungs: Clear to auscultation without wheezing, rhonchi or rales. {no increased work of breathing. CV: Normal S1, S2 without murmurs. Abdomen: Nondistended, nontender. Skin: Warm and dry, without lesions or rashes. Extremities:  No clubbing, cyanosis or edema. Neuro:   Grossly intact.  Diagnostics/Labs: None today  Assessment and plan: Allergic rhinitis with conjunctivitis - Continue avoidance measures for grasses, ragweed, weeds, trees, indoor molds, outdoor molds, dust mites, and cockroach. - Take the following allergy medications:  Xyzal (levocetirizine) 5mg  tablet once daily.   This is an antihistamine allergy tablet.  Dymista (fluticasone/azelastine) 1  spray per nostril 1-2 times daily as needed for runny or stuffy nose.   Cromolyn eye drop 1 drop each eye up to 4 times a day as needed for itchy/watery eyes.   - Consider allergy shots as a means of long-term control if medication management is not effective enough - Allergy shots "re-train" and "reset" the immune system to ignore environmental allergens and decrease the resulting immune response to those allergens (sneezing, itchy watery eyes, runny nose, nasal congestion, etc).    - Allergy shots improve symptoms in 75-85% of patients.  - We can discuss more at a future appointment if the medications are not working for you.  Follow-up in 6-12 months or sooner if needed.  I appreciate the opportunity to take part in Jeremiah Hunter's care. Please do not hesitate to contact me with questions.  Sincerely,   Margo Aye, MD Allergy/Immunology [facility]

## 2023-05-03 NOTE — Patient Instructions (Addendum)
-   Continue avoidance measures for grasses, ragweed, weeds, trees, indoor molds, outdoor molds, dust mites, and cockroach. - Take the following allergy medications:  Xyzal (levocetirizine) 5mg  tablet once daily.   This is an antihistamine allergy tablet.  Dymista (fluticasone/azelastine) 1 spray per nostril 1-2 times daily as needed for runny or stuffy nose.   Cromolyn eye drop 1 drop each eye up to 4 times a day as needed for itchy/watery eyes.   - Consider allergy shots as a means of long-term control if medication management is not effective enough - Allergy shots "re-train" and "reset" the immune system to ignore environmental allergens and decrease the resulting immune response to those allergens (sneezing, itchy watery eyes, runny nose, nasal congestion, etc).    - Allergy shots improve symptoms in 75-85% of patients.  - We can discuss more at a future appointment if the medications are not working for you.  Follow-up in 6-12 months or sooner if needed.

## 2023-05-21 ENCOUNTER — Ambulatory Visit: Payer: MEDICAID | Attending: Cardiovascular Disease | Admitting: Cardiovascular Disease

## 2023-05-21 ENCOUNTER — Encounter: Payer: Self-pay | Admitting: Cardiovascular Disease

## 2023-05-21 VITALS — BP 130/84 | HR 65 | Ht 71.0 in | Wt 205.8 lb

## 2023-05-21 DIAGNOSIS — G4733 Obstructive sleep apnea (adult) (pediatric): Secondary | ICD-10-CM

## 2023-05-21 DIAGNOSIS — I10 Essential (primary) hypertension: Secondary | ICD-10-CM | POA: Diagnosis present

## 2023-05-21 DIAGNOSIS — Z8249 Family history of ischemic heart disease and other diseases of the circulatory system: Secondary | ICD-10-CM | POA: Diagnosis not present

## 2023-05-21 NOTE — Assessment & Plan Note (Signed)
History of obstructive sleep apnea no longer wearing CPAP

## 2023-05-21 NOTE — Patient Instructions (Signed)
 Medication Instructions:  Your physician recommends that you continue on your current medications as directed. Please refer to the Current Medication list given to you today.  *If you need a refill on your cardiac medications before your next appointment, please call your pharmacy*   Follow-Up: At [facility], you and your health needs are our priority.  As part of our continuing mission to provide you with exceptional heart care, our providers are all part of one team.  This team includes your primary Cardiologist (physician) and Advanced Practice Providers or APPs (Physician Assistants and Nurse Practitioners) who all work together to provide you with the care you need, when you need it.  Your next appointment:   We will see you on an as needed basis.  Provider:   Lauro Portal, MD   We recommend signing up for the patient portal called "MyChart".  Sign up information is provided on this After Visit Summary.  MyChart is used to connect with patients for Virtual Visits (Telemedicine).  Patients are able to view lab/test results, encounter notes, upcoming appointments, etc.  Non-urgent messages can be sent to your provider as well.   To learn more about what you can do with MyChart, go to [email or web address].

## 2023-05-21 NOTE — Assessment & Plan Note (Signed)
 History of essential hypertension with blood pressure measured today at 130/84.  He is on amlodipine .

## 2023-05-21 NOTE — Progress Notes (Signed)
 05/21/2023 Jeremiah Hunter   07/31/1960  [id number]  Primary Physician Leanora Prophet, PA-C Primary Cardiologist: Avanell Leigh MD Bennye Bravo, [place]  HPI:  Jeremiah Hunter is a 63 y.o. mildly overweight single African-American male father of 2 daughters, grandfather of 3 grandchildren who has not worked for years and is on disability because of psychiatric illness (schizoaffective).  He was referred by his primary care provider, Leanora Prophet, PA-C, because of risk factors.  His cardiovascular risk factor profile is only notable for treated hypertension.  Father apparently did die of a heart attack in his 42s.  He has never had a heart heart attack or stroke.  He denies chest pain or shortness of breath.  He admits to not being very active.  He walks with a cane because of right lower extremity "neuropathy".  He did have a lipid profile performed 03/01/2022 revealing total cholesterol 162, LDL 79 and HDL of 71.   Current Meds  Medication Sig   acetaminophen  (TYLENOL ) 325 MG tablet Take 650 mg by mouth every 6 (six) hours as needed.   Azelastine -Fluticasone  (DYMISTA ) 137-50 MCG/ACT SUSP Place 1 spray into the nose 2 (two) times daily as needed (runny or stuffy nose).   cromolyn  (OPTICROM ) 4 % ophthalmic solution Place 1 drop into both eyes 4 (four) times daily as needed (itchy/watery eyes).   divalproex  (DEPAKOTE  ER) 500 MG 24 hr tablet Take 2 tablets (1,000 mg total) by mouth daily.   FLUoxetine  (PROZAC ) 20 MG capsule Take by mouth.   gabapentin  (NEURONTIN ) 300 MG capsule TAKE TWO CAPSULES BY MOUTH THREE TIMES A DAY   levocetirizine (XYZAL ) 5 MG tablet Take 1 tablet (5 mg total) by mouth every evening.   meloxicam  (MOBIC ) 7.5 MG tablet TAKE ONE TABLET BY MOUTH DAILY   Multiple Vitamins-Minerals (QC DAILY MULTIVIT/MULTIMINERAL) TABS Bid po   pantoprazole  (PROTONIX ) 40 MG tablet One tablet by mouth ac breakfast and ac supper   Vitamin D , Ergocalciferol , (DRISDOL ) 1.25 MG (50000 UNIT)  CAPS capsule Take 1 capsule (50,000 Units total) by mouth every Sunday.     No Known Allergies  Social History   Socioeconomic History   Marital status: Single    Spouse name: Not on file   Number of children: Not on file   Years of education: Not on file   Highest education level: Not on file  Occupational History   Not on file  Tobacco Use   Smoking status: Never    Passive exposure: Past   Smokeless tobacco: Never  Vaping Use   Vaping status: Never Used  Substance and Sexual Activity   Alcohol use: Not Currently    Comment: states once in a while   Drug use: Never   Sexual activity: Not Currently  Other Topics Concern   Not on file  Social History Narrative   Right handed    Wears glasses    Drinks coffee 2 cups a day    Social Drivers of Health   Financial Resource Strain: At Risk (02/26/2023)   Received from [employer]    Financial Resource Strain: 2  Food Insecurity: At Risk (02/27/2023)   Received from [employer]    Food: 2  Transportation Needs: At Risk (02/27/2023)   Received from [employer] [facility]    Transportation: 2  Physical Activity: Not on File (01/10/2023)   Received from [facility]   Physical Activity    Physical Activity: 0  Stress: Not on File (01/10/2023)   Received from [facility]   Stress    Stress: 0  Social Connections: Not on File (01/10/2023)   Received from [facility]   Social Connections    Connectedness: 0  Intimate Partner Violence: Not At Risk (06/27/2022)   Humiliation, Afraid, Rape, and Kick questionnaire    Fear of Current or Ex-Partner: No    Emotionally Abused: No    Physically Abused: No    Sexually Abused: No     Review of Systems: General: negative for chills, fever, night sweats or weight changes.  Cardiovascular: negative for chest pain, dyspnea on exertion, edema, orthopnea, palpitations, paroxysmal nocturnal dyspnea or shortness of breath Dermatological: negative for  rash Respiratory: negative for cough or wheezing Urologic: negative for hematuria Abdominal: negative for nausea, vomiting, diarrhea, bright red blood per rectum, melena, or hematemesis Neurologic: negative for visual changes, syncope, or dizziness All other systems reviewed and are otherwise negative except as noted above.    Blood pressure 130/84, pulse 65, height 5\' 11"  (1.803 m), weight 205 lb 12.8 oz (93.4 kg), SpO2 97%.  General appearance: alert and no distress Neck: no adenopathy, no carotid bruit, no JVD, supple, symmetrical, trachea midline, and thyroid  not enlarged, symmetric, no tenderness/mass/nodules Lungs: clear to auscultation bilaterally Heart: regular rate and rhythm, S1, S2 normal, no murmur, click, rub or gallop Extremities: extremities normal, atraumatic, no cyanosis or edema Pulses: 2+ and symmetric Skin: Skin color, texture, turgor normal. No rashes or lesions Neurologic: Grossly normal  EKG EKG Interpretation Date/Time:  Tuesday May 21 2023 15:18:19 EDT Ventricular Rate:  65 PR Interval:  148 QRS Duration:  84 QT Interval:  394 QTC Calculation: 409 R Axis:   5  Text Interpretation: Normal sinus rhythm Nonspecific ST and T wave abnormality When compared with ECG of 20-Aug-2019 17:30, T wave amplitude has increased in Anterior leads Confirmed by Lauro Portal [phone]) on 05/21/2023 3:20:57 PM    ASSESSMENT AND PLAN:   OSA (obstructive sleep apnea) History of obstructive sleep apnea no longer wearing CPAP  Essential hypertension History of essential hypertension with blood pressure measured today at 130/84.  He is on amlodipine .  Family history of heart disease Father apparently died of a myocardial infarction in his 78s.     Avanell Leigh MD FACP,FACC,FAHA, [facility] 05/21/2023 3:28 PM

## 2023-05-21 NOTE — Assessment & Plan Note (Signed)
 Father apparently died of a myocardial infarction in his 63s.

## 2023-05-24 ENCOUNTER — Encounter: Payer: Self-pay | Admitting: Gastroenterology

## 2023-06-10 ENCOUNTER — Encounter: Payer: Self-pay | Admitting: Gastroenterology

## 2023-06-10 ENCOUNTER — Ambulatory Visit (INDEPENDENT_AMBULATORY_CARE_PROVIDER_SITE_OTHER): Payer: MEDICAID | Admitting: Gastroenterology

## 2023-06-10 VITALS — BP 130/82 | HR 89 | Ht 71.0 in | Wt 207.0 lb

## 2023-06-10 DIAGNOSIS — K2100 Gastro-esophageal reflux disease with esophagitis, without bleeding: Secondary | ICD-10-CM | POA: Insufficient documentation

## 2023-06-10 DIAGNOSIS — K2270 Barrett's esophagus without dysplasia: Secondary | ICD-10-CM | POA: Insufficient documentation

## 2023-06-10 DIAGNOSIS — K5900 Constipation, unspecified: Secondary | ICD-10-CM

## 2023-06-10 DIAGNOSIS — K5904 Chronic idiopathic constipation: Secondary | ICD-10-CM | POA: Insufficient documentation
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
## 2023-06-10 NOTE — Patient Instructions (Signed)
 Please purchase the following medications over the counter and take as directed: Miralax  one capful in 8 oz of liquid.   GERD in Adults: Diet Changes When you have gastroesophageal reflux disease (GERD), you may need to make changes to your diet. Choosing the right foods can help with your symptoms. Think about working with an expert in healthy eating called a dietitian. They can help you make healthy food choices. What are tips for following this plan? Reading food labels Look for foods that are low in saturated fat. Foods that may help with your symptoms include: Foods with less than 5% of daily value (DV) of fat. Foods with 0 grams of trans fat. Cooking [employer] in ways that don't use a lot of fat. These ways include: Baking. Steaming. Grilling. Broiling. To add flavor, try to use herbs that are low in spice and acidity. Avoid frying your food. Meal planning  Eat small meals often rather than eating 3 large meals each day. Eat your meals slowly in a place where you feel relaxed. If told by your health care provider, avoid: Foods that cause symptoms. Keep a food diary to keep track of foods that cause symptoms. Alcohol. Drinking a lot of liquid with meals. General instructions For 2-3 hours after you eat, avoid: Bending over. Exercise. Lying down. Chew sugar-free gum after meals. What foods should I eat? Eat a healthy diet. Try to include: Foods with high amounts of fiber. These include: Fruits and vegetables. Whole grains and beans. Low-fat dairy products. Lean meats, fish, and poultry. Egg whites. Foods that cause symptoms in someone else may not cause symptoms for you. Work with your provider to find foods that are safe for you. The items listed above may not be all the foods and drinks you can have. Talk with a dietitian to learn more. The items listed above may not be a complete list of foods and beverages you can eat and drink. Contact a dietitian for more  information. What foods should I avoid? Limiting some of these foods may help with your symptoms. Each person is different. Talk with a dietitian or your provider to help you find the exact foods to avoid. Some of the foods to avoid may include: Fruits Fruits with a lot of acid in them. These may include citrus fruits, such as oranges, grapefruit, pineapple, and lemons. Vegetables Deep-fried vegetables, such as [place] fries. Vegetables, sauces, or toppings made with added fat and vegetables with acid in them. These may include tomatoes and tomato products, chili peppers, onions, garlic, and horseradish. Grains Pastries or quick breads with added fat. Meats and other proteins High-fat meats, such as fatty beef or pork, hot dogs, ribs, ham, sausage, salami, and bacon. Fried meat or protein, such as fried fish and fried chicken. Egg yolks. Fats and oils Butter. Margarine. Shortening. Ghee. Drinks Coffee and other drinks with caffeine in them. Fizzy and sugary drinks, such as soda and energy drinks. Fruit juice made with acidic fruits, such as orange or grapefruit. Tomato juice. Sweets and desserts Chocolate and cocoa. Donuts. Seasonings and condiments Mint, such as peppermint and spearmint. Condiments, herbs, or seasonings that cause symptoms. These may include curry, hot sauce, or vinegar-based salad dressings. The items listed above may not be all the foods and drinks you should avoid. Talk with a dietitian to learn more. Questions to ask your health care provider Changes to your diet and everyday life are often the first steps taken to manage symptoms of GERD.  If these changes don't help, talk with your provider about taking medicines. Where to find more information International [employer] for Gastrointestinal Disorders: aboutgerd.org This information is not intended to replace advice given to you by your health care provider. Make sure you discuss any questions you have with your health  care provider. Document Revised: 11/20/2022 Document Reviewed: 06/06/2022 Elsevier Patient Education  2024 [employer].

## 2023-07-02 ENCOUNTER — Ambulatory Visit (INDEPENDENT_AMBULATORY_CARE_PROVIDER_SITE_OTHER): Payer: MEDICAID | Admitting: General Surgery

## 2023-07-02 ENCOUNTER — Encounter: Payer: Self-pay | Admitting: General Surgery

## 2023-07-02 VITALS — BP 124/88 | HR 76 | Temp 98.8°F | Ht 71.0 in | Wt 205.0 lb

## 2023-07-02 DIAGNOSIS — K640 First degree hemorrhoids: Secondary | ICD-10-CM

## 2023-07-02 NOTE — Patient Instructions (Addendum)
 You may take metamucil fiber, they have powder or gummies over the counter. You need to take this every day for 8 weeks, then we will see you back in the office for a follow up      Advised to pursue a goal of 25 to 30 g of fiber daily.  The majority of this may be through natural sources, advised to ensure a minimal daily fiber supplementation.  Various forms of supplements discussed.  Recommend Psyllium husk, with options of mixing with beverage or applesauce to make more tolerable. Strongly advised to consume more fluids(especially in proximity to fiber intake) and to ensure adequate hydration.   Watch color of urine to determine adequacy of hydration.  Clarity is pursued in urine output, and bowel activity that responds and corresponds to significant meal intake.   We need to avoid deferring having bowel movements, advised to take the time at the first sign of sensation, typically following meals, and in the morning.  The need to avoid more frequently, and the presence of flatus may indicate the need for bowel movement.  Do not defer for later.   To be regular, we must do the above EVERY day.   Soluble Fiber Dissolves in Water: Soluble fiber dissolves in water to form a gel-like substance. Slows Digestion: This type of fiber slows down digestion, which can help control blood sugar levels and lower cholesterol. Sources: Common sources include oats, beans, apples, citrus fruits, and psyllium. Benefits: Helps manage cholesterol levels. Aids in blood sugar control. Increases healthy gut bacteria, which can lower inflammation and improve digestion.  Insoluble Fiber Does Not Dissolve in Water: Insoluble fiber does not dissolve in water and remains mostly intact as it passes through the digestive system. Adds Bulk to Stool: It adds bulk to stool, which helps promote regular bowel movements and prevent constipation. Sources: Common sources include whole grains, nuts, beans, and vegetables like  cauliflower and potatoes. Benefits: Improves bowel health and regularity. Reduces the risk of colorectal conditions like hemorrhoids and diverticulitis. Supports insulin sensitivity in people with diabetes.  Both types of fiber are essential for overall health, and it's beneficial to include a 50/50 mix of both in your diet.
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
## 2023-07-11 ENCOUNTER — Ambulatory Visit: Payer: MEDICAID | Admitting: Neurology

## 2023-07-15 ENCOUNTER — Emergency Department (HOSPITAL_COMMUNITY)
Admission: EM | Admit: 2023-07-15 | Discharge: 2023-07-15 | Disposition: A | Discharge status: Home / Self Care | Payer: MEDICAID | Attending: Emergency Medicine | Admitting: Emergency Medicine

## 2023-07-15 ENCOUNTER — Encounter (HOSPITAL_COMMUNITY): Payer: Self-pay

## 2023-07-15 ENCOUNTER — Telehealth: Payer: Self-pay | Admitting: *Deleted

## 2023-07-15 ENCOUNTER — Other Ambulatory Visit: Payer: Self-pay

## 2023-07-15 ENCOUNTER — Emergency Department (HOSPITAL_COMMUNITY): Payer: MEDICAID

## 2023-07-15 DIAGNOSIS — I10 Essential (primary) hypertension: Secondary | ICD-10-CM | POA: Insufficient documentation

## 2023-07-15 DIAGNOSIS — R197 Diarrhea, unspecified: Secondary | ICD-10-CM | POA: Insufficient documentation

## 2023-07-15 DIAGNOSIS — F0390 Unspecified dementia without behavioral disturbance: Secondary | ICD-10-CM | POA: Insufficient documentation

## 2023-07-15 DIAGNOSIS — Z79899 Other long term (current) drug therapy: Secondary | ICD-10-CM | POA: Insufficient documentation

## 2023-07-15 DIAGNOSIS — R1032 Left lower quadrant pain: Secondary | ICD-10-CM | POA: Insufficient documentation

## 2023-07-15 LAB — URINALYSIS, ROUTINE W REFLEX MICROSCOPIC
Bacteria, UA: NONE SEEN
Bilirubin Urine: NEGATIVE
Glucose, UA: NEGATIVE mg/dL
Ketones, ur: NEGATIVE mg/dL
Leukocytes,Ua: NEGATIVE
Nitrite: NEGATIVE
Protein, ur: NEGATIVE mg/dL
Specific Gravity, Urine: 1.021 (ref 1.005–1.030)
pH: 6 (ref 5.0–8.0)

## 2023-07-15 LAB — CBC
HCT: 43.1 % (ref 39.0–52.0)
Hemoglobin: 14.7 g/dL (ref 13.0–17.0)
MCH: 29.9 pg (ref 26.0–34.0)
MCHC: 34.1 g/dL (ref 30.0–36.0)
MCV: 87.6 fL (ref 80.0–100.0)
Platelets: 185 10*3/uL (ref 150–400)
RBC: 4.92 MIL/uL (ref 4.22–5.81)
RDW: 13.9 % (ref 11.5–15.5)
WBC: 4.3 10*3/uL (ref 4.0–10.5)
nRBC: 0 % (ref 0.0–0.2)

## 2023-07-15 LAB — COMPREHENSIVE METABOLIC PANEL WITH GFR
ALT: 39 U/L (ref 0–44)
AST: 32 U/L (ref 15–41)
Albumin: 3.9 g/dL (ref 3.5–5.0)
Alkaline Phosphatase: 94 U/L (ref 38–126)
Anion gap: 9 (ref 5–15)
BUN: 16 mg/dL (ref 8–23)
CO2: 23 mmol/L (ref 22–32)
Calcium: 9.5 mg/dL (ref 8.9–10.3)
Chloride: 106 mmol/L (ref 98–111)
Creatinine, Ser: 1.17 mg/dL (ref 0.61–1.24)
GFR, Estimated: >60 mL/min (ref >60)
Glucose, Bld: 97 mg/dL (ref 70–99)
Potassium: 3.6 mmol/L (ref 3.5–5.1)
Sodium: 138 mmol/L (ref 135–145)
Total Bilirubin: 0.5 mg/dL (ref 0.0–1.2)
Total Protein: 7.2 g/dL (ref 6.5–8.1)

## 2023-07-15 LAB — LIPASE, BLOOD: Lipase: 31 U/L (ref 11–51)

## 2023-07-15 MED ORDER — IOHEXOL 350 MG/ML SOLN
75.0000 mL | Freq: Once | INTRAVENOUS | Status: AC | PRN
  Administered 2023-07-15: 75 mL via INTRAVENOUS

## 2023-07-15 NOTE — ED Provider Notes (Signed)
 [place] EMERGENCY DEPARTMENT AT [facility] Provider Note   CSN: [id number] Arrival date & time: 07/15/23  0141     Patient presents with: Abdominal Pain   Clent Damore is a 63 y.o. male hypertension, external hemorrhoids with complication, prolapsed internal hemorrhoids grade 3, schizoaffective disorder, chronic left shoulder pain, chronic idiopathic constipation, GERD, Barrett's esophagus.  Patient presents to ED for evaluation of abdominal pain and diarrhea.  Reports that he has been seen by GI multiple times this year.  Reports he had a colonoscopy at 1 point.  States that he has been advised to increase his fiber intake as well as his MiraLAX  usage due to constipation.  States that he has been taking MiraLAX  for the last few days.  Reports that this evening after eating carrots, sweet potato and eggs he developed pain in his left lower quadrant as well as diarrhea.  He denies any blood in his stool, nausea or vomiting.  Denies any fevers at home.  Reports that he had multiple episodes of diarrhea.  Denies any new food intake.  Denies any recent travel.  Denies any abdominal pain at this time on my examination.      Abdominal Pain Associated symptoms: diarrhea   Associated symptoms: no dysuria, no fever, no nausea and no vomiting        Prior to Admission medications   Medication Sig Start Date End Date Taking? Authorizing Provider  amLODipine  (NORVASC ) 5 MG tablet Take 1 tablet (5 mg total) by mouth daily. TAKE 1 TABLET(5 MG) BY MOUTH DAILY 01/14/23 05/03/23  Vicci Barnie NOVAK, MD  Azelastine -Fluticasone  (DYMISTA ) 137-50 MCG/ACT SUSP Place 1 spray into the nose 2 (two) times daily as needed (runny or stuffy nose). 05/03/23   Jeneal Danita Macintosh, MD  cromolyn  (OPTICROM ) 4 % ophthalmic solution Place 1 drop into both eyes 4 (four) times daily as needed (itchy/watery eyes). 05/03/23   Jeneal Danita Macintosh, MD  cyclobenzaprine  (FLEXERIL ) 5 MG tablet TAKE 1 TABLET (5  MG TOTAL) BY MOUTH AT BEDTIME. FOR MUSCLE CRAMPS 02/20/23   Newlin, Enobong, MD  divalproex  (DEPAKOTE  ER) 500 MG 24 hr tablet Take 2 tablets (1,000 mg total) by mouth daily. 09/04/22   Brien Belvie BRAVO, MD  FLUoxetine  (PROZAC ) 20 MG capsule Take by mouth. 06/07/20   [provider]  gabapentin  (NEURONTIN ) 300 MG capsule TAKE TWO CAPSULES BY MOUTH THREE TIMES A DAY 04/12/23   Newlin, Enobong, MD  levocetirizine (XYZAL ) 5 MG tablet Take 1 tablet (5 mg total) by mouth every evening. 05/03/23   Jeneal Danita Macintosh, MD  meloxicam  (MOBIC ) 7.5 MG tablet TAKE ONE TABLET BY MOUTH DAILY 02/20/23   Newlin, Enobong, MD  Multiple Vitamins-Minerals (QC DAILY MULTIVIT/MULTIMINERAL) TABS Bid po 03/14/23   Dohmeier, Dedra, MD  pantoprazole  (PROTONIX ) 40 MG tablet One tablet by mouth ac breakfast and ac supper 04/19/23   Nandigam, Kavitha V, MD  Vitamin D , Ergocalciferol , (DRISDOL ) 1.25 MG (50000 UNIT) CAPS capsule Take 1 capsule (50,000 Units total) by mouth every Sunday. 01/20/23   Newlin, Enobong, MD    Allergies: Patient has no known allergies.    Review of Systems  Constitutional:  Negative for fever.  Gastrointestinal:  Positive for abdominal pain and diarrhea. Negative for nausea and vomiting.  Genitourinary:  Negative for dysuria.  All other systems reviewed and are negative.   Updated Vital Signs BP 133/79 (BP Location: Left Arm)   Pulse 66   Temp 98 F (36.7 C)   Resp 15  Ht 5' 11 (1.803 m)   Wt 93 kg   SpO2 100%   BMI 28.59 kg/m   Physical Exam Vitals and nursing note reviewed.  Constitutional:      General: He is not in acute distress.    Appearance: He is well-developed.  HENT:     Head: Normocephalic and atraumatic.   Eyes:     Conjunctiva/sclera: Conjunctivae normal.    Cardiovascular:     Rate and Rhythm: Normal rate and regular rhythm.     Heart sounds: No murmur heard. Pulmonary:     Effort: Pulmonary effort is normal. No respiratory distress.     Breath  sounds: Normal breath sounds.  Abdominal:     Palpations: Abdomen is soft.     Tenderness: There is no abdominal tenderness. There is no right CVA tenderness or left CVA tenderness.     Comments: Abdomen soft and compressible, no tenderness, no overlying skin change.  No CVA tenderness bilaterally   Musculoskeletal:        General: No swelling.     Cervical back: Neck supple.   Skin:    General: Skin is warm and dry.     Capillary Refill: Capillary refill takes less than 2 seconds.   Neurological:     Mental Status: He is alert and oriented to person, place, and time. Mental status is at baseline.   Psychiatric:        Mood and Affect: Mood normal.     (all labs ordered are listed, but only abnormal results are displayed) Labs Reviewed  URINALYSIS, ROUTINE W REFLEX MICROSCOPIC - Abnormal; Notable for the following components:      Result Value   Color, Urine STRAW (*)    Hgb urine dipstick SMALL (*)    All other components within normal limits  LIPASE, BLOOD  COMPREHENSIVE METABOLIC PANEL WITH GFR  CBC    EKG: None  Radiology: CT ABDOMEN PELVIS W CONTRAST Result Date: 07/15/2023 CLINICAL DATA:  Left lower quadrant abdominal pain EXAM: CT ABDOMEN AND PELVIS WITH CONTRAST TECHNIQUE: Multidetector CT imaging of the abdomen and pelvis was performed using the standard protocol following bolus administration of intravenous contrast. RADIATION DOSE REDUCTION: This exam was performed according to the departmental dose-optimization program which includes automated exposure control, adjustment of the mA and/or kV according to patient size and/or use of iterative reconstruction technique. CONTRAST:  75mL OMNIPAQUE IOHEXOL 350 MG/ML SOLN COMPARISON:  12/23/2022 FINDINGS: Lower chest: No acute abnormality. Hepatobiliary: No focal liver abnormality is seen. No gallstones, gallbladder wall thickening, or biliary dilatation. Pancreas: Unremarkable Spleen: Unremarkable Adrenals/Urinary Tract:  Adrenal glands are unremarkable. Simple cortical cysts are seen within the kidneys bilaterally for which no follow-up imaging is recommended. The kidneys are otherwise unremarkable. Bladder unremarkable. Stomach/Bowel: Stomach is within normal limits. Appendix appears normal. No evidence of bowel wall thickening, distention, or inflammatory changes. Vascular/Lymphatic: No significant vascular findings are present. No enlarged abdominal or pelvic lymph nodes. Reproductive: Prostate is unremarkable. Other: No abdominal wall hernia or abnormality. No abdominopelvic ascites. Musculoskeletal: Left hip AVN without articular collapse. No acute bone abnormality. No suspicious lytic or blastic bone lesion. IMPRESSION: 1. No acute intra-abdominal pathology identified. No bowel obstruction. Normal Appendix. 2. Left hip AVN without articular collapse. Electronically Signed   By: Dorethia Molt M.D.   On: 07/15/2023 04:33    Procedures   Medications Ordered in the ED  iohexol (OMNIPAQUE) 350 MG/ML injection 75 mL (75 mLs Intravenous Contrast Given 07/15/23 0421)  Medical Decision Making Amount and/or Complexity of Data Reviewed Labs: ordered. Radiology: ordered.   63 year old man presents for evaluation.  Please see HPI for further details.  On examination the patient is afebrile and nontachycardic.  Lung sounds are clear bilaterally, he is not hypoxic.  Abdomen is soft and compressible with no tenderness noted.  No overlying skin change, rebound guarding.  No CVA tenderness bilaterally.  Neurological examination at baseline.  Patient chart reviewed.  Patient apparently has history of diverticulitis.  Endorsing left lower quadrant pain.  Will collect CBC, CMP, lipase, urinalysis, CT abdomen pelvis, stool sample.  Patient CBC without leukocytosis or anemia.  Metabolic panel without electrolyte derangement, no elevated LFTs, anion gap 9.  Patient urinalysis shows hemoglobin however patient denies dysuria.   Lipase 31.  CT abdomen pelvis shows no acute intraabdominal pathology.  Patient was asked numerous times for stool sample but states he is unable to provide one.  At this time, patient has had no nausea or vomiting in department.  Resting comfortably in the bed.  Vital signs reassuring.  Labwork reassuring.  Patient will be discharged home at this time and advised to follow-up with his GI doctor.  He voiced understanding.  He is stable to discharge home    Final diagnoses:  Left lower quadrant abdominal pain  Diarrhea, unspecified type    ED Discharge Orders     None          Ruthell Lonni JULIANNA DEVONNA 07/15/23 [id number]    Griselda Norris, MD 07/16/23 [phone]

## 2023-07-15 NOTE — Telephone Encounter (Signed)
 Pt called regarding which pharmacy Rx was e-scribed to.  RNCM reviewed chart to access After Visit Summary and found that Rx was not written.  Advised pt to visit his pcp,urgent care or return to ED if he needs to be seen again.

## 2023-07-15 NOTE — ED Triage Notes (Signed)
 Pt BIB [facility] from home  d/t diarrhea and LUQ ABD pain for 3 hours but has had GI issues since February when he had and Endoscopy.  Denies N/V  BP 140/90 HR 76 96 % on RA 109CBG

## 2023-07-15 NOTE — Discharge Instructions (Addendum)
 It was a pleasure taking part in your care.  As discussed, your workup is reassuring.  Please follow-up with your GI doctor at your earliest convenience.  Please continue taking Protonix  as prescribed.  Please return to the ED with any new or worsening symptoms.  Please begin taking Imodium as directed for diarrhea.

## 2023-07-15 NOTE — ED Notes (Signed)
 Patient would like the attending team to know that one of his physicians is Kavitha Nandigam.

## 2023-08-23 NOTE — Procedures (Signed)
Mask fit

## 2023-08-27 ENCOUNTER — Ambulatory Visit: Payer: MEDICAID | Admitting: General Surgery

## 2023-08-28 ENCOUNTER — Encounter (HOSPITAL_COMMUNITY): Payer: Self-pay

## 2023-08-28 ENCOUNTER — Emergency Department (HOSPITAL_COMMUNITY)
Admission: EM | Admit: 2023-08-28 | Discharge: 2023-08-28 | Disposition: A | Discharge status: Home / Self Care | Payer: MEDICAID | Attending: Emergency Medicine | Admitting: Emergency Medicine

## 2023-08-28 DIAGNOSIS — X500XXA Overexertion from strenuous movement or load, initial encounter: Secondary | ICD-10-CM | POA: Insufficient documentation

## 2023-08-28 DIAGNOSIS — Z79899 Other long term (current) drug therapy: Secondary | ICD-10-CM | POA: Insufficient documentation

## 2023-08-28 DIAGNOSIS — S40022A Contusion of left upper arm, initial encounter: Secondary | ICD-10-CM | POA: Diagnosis present

## 2023-08-28 DIAGNOSIS — T148XXA Other injury of unspecified body region, initial encounter: Secondary | ICD-10-CM

## 2023-08-28 DIAGNOSIS — I10 Essential (primary) hypertension: Secondary | ICD-10-CM | POA: Diagnosis not present

## 2023-08-28 NOTE — ED Triage Notes (Signed)
 Pt donated plasma for the first time yesterday. During process, pt bent his arm, pt endorsed arm pain, bruising and swelling throughout the night. Pt has visible bruising on inner L arm. Denies any other pain or complications post donation.
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
## 2023-08-28 NOTE — ED Notes (Signed)
 Pt now endorsing L hip pain radiating down his leg. As well as R lower leg numbness/tingling.

## 2023-08-28 NOTE — ED Notes (Signed)
 Large dark bruising and swelling on inner L arm, pt denies any numbness or tingling in finger tips.

## 2023-08-28 NOTE — Discharge Instructions (Signed)
 Keep your arm elevated above your heart for the next 2 to 3 days.  You can place cold compresses to the area.  Be sure to avoid any heavy lifting  it may take several days for the bruising to resolve

## 2023-09-17 ENCOUNTER — Ambulatory Visit
Admission: EM | Admit: 2023-09-17 | Discharge: 2023-09-17 | Disposition: A | Discharge status: Home / Self Care | Payer: MEDICAID | Attending: Family Medicine | Admitting: Family Medicine

## 2023-09-17 DIAGNOSIS — S61217A Laceration without foreign body of left little finger without damage to nail, initial encounter: Secondary | ICD-10-CM | POA: Diagnosis not present

## 2023-09-17 DIAGNOSIS — M79645 Pain in left finger(s): Secondary | ICD-10-CM | POA: Diagnosis not present

## 2023-09-17 NOTE — Discharge Instructions (Signed)
 WOUND CARE Please return in 10 days to have your stitches/staples removed or sooner if you have concerns.  Tomorrow, remove bandage and wash wound gently with mild soap and warm water. Reapply a new bandage after cleaning wound, if you will expose to a dirty environment. Otherwise, leave the area clean and dry, uncovered.  Continue daily cleansing with soap and water until stitches/staples are removed.  Do not apply any ointments or creams to the wound while stitches/staples are in place, as this may cause delayed healing.  Notify the office if you experience any of the following signs of infection: Swelling, redness, pus drainage, streaking, fever >101.0 F  Notify the office if you experience excessive bleeding that does not stop after 15-20 minutes of constant, firm pressure.
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
## 2023-09-17 NOTE — ED Triage Notes (Signed)
 Pt reports he cut the left pinky finger today with a knife when he tried to cut a watermelon at his house.

## 2023-09-25 ENCOUNTER — Other Ambulatory Visit: Payer: Self-pay | Admitting: Gastroenterology

## 2023-09-25 ENCOUNTER — Other Ambulatory Visit: Payer: Self-pay | Admitting: Family Medicine

## 2023-09-25 ENCOUNTER — Other Ambulatory Visit: Payer: Self-pay | Admitting: Allergy

## 2023-09-26 NOTE — Telephone Encounter (Signed)
 Requested medication (s) are due for refill today: yes  Requested medication (s) are on the active medication list: yes  Last refill:  01/15/23  Future visit scheduled: no  Notes to clinic:  Manual Review: Route requests for 50,000 IU strength to the provide      Requested Prescriptions  Pending Prescriptions Disp Refills   Vitamin D , Ergocalciferol , (DRISDOL ) 1.25 MG (50000 UNIT) CAPS capsule [Pharmacy Med Name: ERGOCALCIFEROL  1.25MG  CAP] 12 capsule 7    Sig: Take 1 capsule (50,000 Units total) by mouth every Sunday.     Endocrinology:  Vitamins - Vitamin D  Supplementation 2 Failed - 09/26/2023 10:05 AM      Failed - Manual Review: Route requests for 50,000 IU strength to the provider      Failed - Vitamin D  in normal range and within 360 days    Vit D, 25-Hydroxy  Date Value Ref Range Status  09/04/2022 51.7 30.0 - 100.0 ng/mL Final    Comment:    Vitamin D  deficiency has been defined by the Institute of Medicine and an Endocrine Society practice guideline as a level of serum 25-OH vitamin D  less than 20 ng/mL (1,2). The Endocrine Society went on to further define vitamin D  insufficiency as a level between 21 and 29 ng/mL (2). 1. IOM (Institute of Medicine). 2010. Dietary reference    intakes for calcium and D. [place]: The    [employer]. 2. Holick MF, Binkley Kersey, Bischoff-Ferrari HA, et al.    Evaluation, treatment, and prevention of vitamin D     deficiency: an Endocrine Society clinical practice    guideline. JCEM. 2011 Jul; 96(7):1911-30.          Passed - Ca in normal range and within 360 days    Calcium  Date Value Ref Range Status  07/15/2023 9.5 8.9 - 10.3 mg/dL Final         Passed - Valid encounter within last 12 months    Recent Outpatient Visits           9 months ago Arthralgia, unspecified joint   [facility] [place]. [facility] Delbert Clam, MD   1 year ago Essential hypertension    [facility] [place]. [facility] Brien Belvie BRAVO, MD   1 year ago Nocturnal leg cramps   [facility] [place]. [facility] Theotis Haze ORN, NP   1 year ago Bipolar disorder, current episode depressed, severe, without psychotic features (HCC)   [facility] [place]. [facility] Brien Belvie BRAVO, MD   1 year ago Displaced fracture of lateral end of left clavicle, initial encounter for closed fracture   [facility] Of [place]. [facility] Brien Belvie BRAVO, MD       Future Appointments             Tomorrow [facility])

## 2023-09-27 ENCOUNTER — Ambulatory Visit
Admission: RE | Admit: 2023-09-27 | Discharge: 2023-09-27 | Disposition: A | Discharge status: Home / Self Care | Payer: MEDICAID | Source: Ambulatory Visit | Attending: Physician Assistant | Admitting: Physician Assistant

## 2023-09-27 DIAGNOSIS — Z4802 Encounter for removal of sutures: Secondary | ICD-10-CM | POA: Diagnosis not present

## 2023-09-27 NOTE — ED Triage Notes (Signed)
 Pt present for suture removal. States he still has some pain and soreness.

## 2023-10-10 ENCOUNTER — Ambulatory Visit: Payer: MEDICAID | Admitting: General Surgery

## 2023-10-22 ENCOUNTER — Other Ambulatory Visit: Payer: Self-pay | Admitting: Allergy

## 2023-11-12 ENCOUNTER — Other Ambulatory Visit: Payer: Self-pay | Admitting: Family Medicine

## 2024-01-08 ENCOUNTER — Emergency Department (HOSPITAL_BASED_OUTPATIENT_CLINIC_OR_DEPARTMENT_OTHER)
Admission: EM | Admit: 2024-01-08 | Discharge: 2024-01-08 | Disposition: A | Discharge status: Home / Self Care | Payer: MEDICAID | Attending: Emergency Medicine | Admitting: Emergency Medicine

## 2024-01-08 ENCOUNTER — Emergency Department (HOSPITAL_BASED_OUTPATIENT_CLINIC_OR_DEPARTMENT_OTHER): Payer: MEDICAID

## 2024-01-08 ENCOUNTER — Encounter (HOSPITAL_BASED_OUTPATIENT_CLINIC_OR_DEPARTMENT_OTHER): Payer: Self-pay

## 2024-01-08 DIAGNOSIS — Z79899 Other long term (current) drug therapy: Secondary | ICD-10-CM | POA: Diagnosis not present

## 2024-01-08 DIAGNOSIS — R519 Headache, unspecified: Secondary | ICD-10-CM

## 2024-01-08 DIAGNOSIS — J323 Chronic sphenoidal sinusitis: Secondary | ICD-10-CM | POA: Insufficient documentation

## 2024-01-08 LAB — CBC WITH DIFFERENTIAL/PLATELET
Abs Immature Granulocytes: 0.01 K/uL (ref 0.00–0.07)
Basophils Absolute: 0 K/uL (ref 0.0–0.1)
Basophils Relative: 0 %
Eosinophils Absolute: 0.1 K/uL (ref 0.0–0.5)
Eosinophils Relative: 1 %
HCT: 41.1 % (ref 39.0–52.0)
Hemoglobin: 14.1 g/dL (ref 13.0–17.0)
Immature Granulocytes: 0 %
Lymphocytes Relative: 46 %
Lymphs Abs: 2 K/uL (ref 0.7–4.0)
MCH: 29.3 pg (ref 26.0–34.0)
MCHC: 34.3 g/dL (ref 30.0–36.0)
MCV: 85.3 fL (ref 80.0–100.0)
Monocytes Absolute: 0.3 K/uL (ref 0.1–1.0)
Monocytes Relative: 7 %
Neutro Abs: 2 K/uL (ref 1.7–7.7)
Neutrophils Relative %: 46 %
Platelets: 208 K/uL (ref 150–400)
RBC: 4.82 MIL/uL (ref 4.22–5.81)
RDW: 14.3 % (ref 11.5–15.5)
WBC: 4.3 K/uL (ref 4.0–10.5)
nRBC: 0 % (ref 0.0–0.2)

## 2024-01-08 LAB — COMPREHENSIVE METABOLIC PANEL WITH GFR
ALT: 24 U/L (ref 0–44)
AST: 24 U/L (ref 15–41)
Albumin: 3.9 g/dL (ref 3.5–5.0)
Alkaline Phosphatase: 102 U/L (ref 38–126)
Anion gap: 8 (ref 5–15)
BUN: 13 mg/dL (ref 8–23)
CO2: 26 mmol/L (ref 22–32)
Calcium: 9.4 mg/dL (ref 8.9–10.3)
Chloride: 106 mmol/L (ref 98–111)
Creatinine, Ser: 1 mg/dL (ref 0.61–1.24)
GFR, Estimated: >60 mL/min (ref >60)
Glucose, Bld: 154 mg/dL — ABNORMAL HIGH (ref 70–99)
Potassium: 3.7 mmol/L (ref 3.5–5.1)
Sodium: 140 mmol/L (ref 135–145)
Total Bilirubin: 0.5 mg/dL (ref 0.0–1.2)
Total Protein: 6.8 g/dL (ref 6.5–8.1)

## 2024-01-08 MED ORDER — DIPHENHYDRAMINE HCL 50 MG/ML IJ SOLN
12.5000 mg | Freq: Once | INTRAMUSCULAR | Status: AC
  Administered 2024-01-08: 17:00:00 12.5 mg via INTRAVENOUS
  Filled 2024-01-08: qty 1

## 2024-01-08 MED ORDER — PROCHLORPERAZINE EDISYLATE 10 MG/2ML IJ SOLN
5.0000 mg | Freq: Once | INTRAMUSCULAR | Status: AC
  Administered 2024-01-08: 17:00:00 5 mg via INTRAVENOUS
  Filled 2024-01-08: qty 2

## 2024-01-08 MED ORDER — DEXAMETHASONE SOD PHOSPHATE PF 10 MG/ML IJ SOLN
10.0000 mg | Freq: Once | INTRAMUSCULAR | Status: AC
  Administered 2024-01-08: 17:00:00 10 mg via INTRAVENOUS

## 2024-01-08 NOTE — Discharge Instructions (Signed)
 Please follow-up with ENT for your chronic appearing sinus infection.  Follow-up with primary care for recheck of symptoms and return to ER with new or worsening symptoms

## 2024-01-08 NOTE — ED Triage Notes (Signed)
 Patient reports sharp headache on the right side of his head for 2 days. He also reports his tongue hurts like he bit it but he did not.

## 2024-01-08 NOTE — ED Provider Notes (Signed)
 [place] EM[facility] DE[facility] AT [facility] Provider Note   CSN: [id number] Arrival date & time: 01/08/24  1517     Patient presents with: Headache   Jeremiah Hunter is a 62 y.o. male patient with past medical history of schizophrenia, bipolar disorder, obstructive sleep apnea, seizure disorder presents to emergency room with complaint of 2 days of headache.  Patient reports that he gradually started getting headache about 2 days ago behind his right eye and it seems to be getting worse.  Denies any injury trauma or fall.  He denies having any sudden onset or severe headache.  He reports he does not have a history of headaches or anything similar.  He also reports that for approximately 3 years he has had intermittent tongue pain or soreness that he has seen a dentist for.  He reports he has been having some continued tongue pain on the right side.  He denies any dental pain or ulceration. No acute vision change, speech change or weakness/tingling. Dose report history of peripheral neuropathy.    Headache      Prior to Admission medications  Medication Sig Start Date End Date Taking? Authorizing Provider  amLODipine  (NORVASC ) 5 MG tablet Take 1 tablet (5 mg total) by mouth daily. TAKE 1 TABLET(5 MG) BY MOUTH DAILY 01/14/23 05/03/23  Vicci Barnie NOVAK, MD  Azelastine -Fluticasone  (DYMISTA ) 137-50 MCG/ACT SUSP PLACE 1 SPRAY INTO THE FOR NOSE ONLY TWO (TWO) TIMES DAILY AS NEEDED (RUNNY OR STUFFY NOSE) 09/25/23   Padgett, Danita Macintosh, MD  cromolyn  (OPTICROM ) 4 % ophthalmic solution PLACE 1 DROP INTO BOTH EYES 4 (FOUR) TIMES DAILY AS NEEDED (ITCHY/WATERY EYES)\ 09/25/23   Padgett, Danita Macintosh, MD  cyclobenzaprine  (FLEXERIL ) 5 MG tablet TAKE 1 TABLET (5 MG TOTAL) BY MOUTH AT BEDTIME. FOR MUSCLE CRAMPS 02/20/23   Newlin, Enobong, MD  divalproex  (DEPAKOTE  ER) 500 MG 24 hr tablet Take 2 tablets (1,000 mg total) by mouth daily. 09/04/22   Brien Belvie BRAVO, MD  FLUoxetine  (PROZAC ) 20  MG capsule Take by mouth. 06/07/20   [provider]  gabapentin  (NEURONTIN ) 300 MG capsule TAKE TWO CAPSULES BY MOUTH THREE TIMES A DAY 04/12/23   Newlin, Enobong, MD  levocetirizine (XYZAL ) 5 MG tablet ( put it in bottle)TAKE 1 TABLET (5 MG TOTAL) BY MOUTH EVERY EVENING 10/22/23   Padgett, Danita Macintosh, MD  meloxicam  (MOBIC ) 7.5 MG tablet TAKE ONE TABLET BY MOUTH DAILY 02/20/23   Newlin, Enobong, MD  Multiple Vitamins-Minerals (QC DAILY MULTIVIT/MULTIMINERAL) TABS Bid po 03/14/23   Dohmeier, Dedra, MD  pantoprazole  (PROTONIX ) 40 MG tablet ONE TABLET BY MOUTH BEFORE BREAKFAST AND BEFORE MEALS SUPPER (BOTTLE) 09/25/23   Nandigam, Kavitha V, MD  Vitamin D , Ergocalciferol , (DRISDOL ) 1.25 MG (50000 UNIT) CAPS capsule Take 1 capsule (50,000 Units total) by mouth every Sunday. 01/20/23   Newlin, Enobong, MD    Allergies: Patient has no known allergies.    Review of Systems  Neurological:  Positive for headaches.    Updated Vital Signs BP (!) 144/89 (BP Location: Right Arm)   Pulse 60   Temp 98 F (36.7 C)   Resp 18   SpO2 97%   Physical Exam Vitals and nursing note reviewed.  Constitutional:      General: He is not in acute distress.    Appearance: He is not toxic-appearing.  HENT:     Head: Normocephalic and atraumatic.  Eyes:     General: No scleral icterus.    Conjunctiva/sclera: Conjunctivae normal.  Comments: Tongue appears normal without ulceration/rash/swelling.  Cardiovascular:     Rate and Rhythm: Normal rate and regular rhythm.     Pulses: Normal pulses.     Heart sounds: Normal heart sounds.  Pulmonary:     Effort: Pulmonary effort is normal. No respiratory distress.     Breath sounds: Normal breath sounds.  Abdominal:     General: Abdomen is flat. Bowel sounds are normal.     Palpations: Abdomen is soft.     Tenderness: There is no abdominal tenderness.  Skin:    General: Skin is warm and dry.     Findings: No lesion.  Neurological:     General: No  focal deficit present.     Mental Status: He is alert and oriented to person, place, and time. Mental status is at baseline.     GCS: GCS eye subscore is 4. GCS verbal subscore is 5. GCS motor subscore is 6.     Cranial Nerves: No cranial nerve deficit.     Sensory: No sensory deficit.     Comments: No focal deficit.  No meningeal sign.      (all labs ordered are listed, but only abnormal results are displayed) Labs Reviewed - No data to display  EKG: None  Radiology: No results found.   Procedures   Medications Ordered in the ED - No data to display  Clinical Course as of 01/08/24 1841  Wed Jan 08, 2024  1839 On reassessment patient's headache is gone.  He is feeling better and requesting discharge. [JB]    Clinical Course User Index [JB] Preet Perrier, Warren SAILOR, PA-C                                 Medical Decision Making Amount and/or Complexity of Data Reviewed Labs: ordered. Radiology: ordered.  Risk Prescription drug management.   This patient presents to the ED for concern of headache, this involves an extensive number of treatment options, and is a complaint that carries with it a high risk of complications and morbidity.  The differential diagnosis includes tension headache, migraine, intracranial mass, intracranial hemorrhage, intracranial infection including meningitis vs encephalitis, trigeminal neuralgia, AVM, sinusitis, cerebral aneurysm, muscular headache, cavernous sinus thrombosis, carotid artery dissection.   Lab Tests:  I personally interpreted labs.  The pertinent results include:   CBC, CMP    Imaging Studies ordered:  I ordered imaging studies including head CT I independently visualized and interpreted imaging which showed no acute findings, chronic sphenoid sinusitis - will have him follow up with ENT. I agree with the radiologist interpretation   Problem List / ED Course / Critical interventions / Medication management  Patient presented  to emergency room with complaint of headache.  He reports that this is relatively new onset and he does not have a history of headache.  He does not have any focal neurological deficit on exam he is hemodynamically stable and well-appearing he does not have any injury trauma or fall.  He denies any fever and no sign of meningismus on exam.  His lab work is overall reassuring.  I did obtain head CT which shows no acute findings.  I discussed all results with patient.  On reassessment he had significant improvement in symptoms. I ordered medication including migraine cocktail given for headache  Reevaluation of the patient after these medicines showed that the patient improved I have reviewed the patients home medicines and  have made adjustments as needed. Vital stable, well-appearing.  No meningismus.  Workup overall reassuring and improvement in symptoms after getting treatment of migraine cocktail.  Feel stable for discharge with outpatient follow-up.        Final diagnoses:  Bad headache  Chronic sphenoidal sinusitis    ED Discharge Orders     None          Shermon Warren SAILOR, PA-C 01/08/24 1842    Charlyn Sora, MD 01/08/24 [facility]
